# Patient Record
Sex: Female | Born: 1980 | Hispanic: Yes | Marital: Married | Smoking: Former smoker
Health system: Southern US, Community
[De-identification: ages and names within clinical notes are randomized; demographics above are authoritative.]

## PROBLEM LIST (undated history)

## (undated) DIAGNOSIS — E559 Vitamin D deficiency, unspecified: Secondary | ICD-10-CM

## (undated) DIAGNOSIS — E669 Obesity, unspecified: Secondary | ICD-10-CM

## (undated) DIAGNOSIS — F32A Depression, unspecified: Secondary | ICD-10-CM

## (undated) DIAGNOSIS — D649 Anemia, unspecified: Secondary | ICD-10-CM

## (undated) DIAGNOSIS — I10 Essential (primary) hypertension: Secondary | ICD-10-CM

## (undated) DIAGNOSIS — Z803 Family history of malignant neoplasm of breast: Secondary | ICD-10-CM

## (undated) DIAGNOSIS — R632 Polyphagia: Secondary | ICD-10-CM

## (undated) DIAGNOSIS — R7303 Prediabetes: Secondary | ICD-10-CM

## (undated) DIAGNOSIS — F329 Major depressive disorder, single episode, unspecified: Secondary | ICD-10-CM

## (undated) DIAGNOSIS — M1990 Unspecified osteoarthritis, unspecified site: Secondary | ICD-10-CM

## (undated) DIAGNOSIS — F419 Anxiety disorder, unspecified: Secondary | ICD-10-CM

## (undated) DIAGNOSIS — Z923 Personal history of irradiation: Secondary | ICD-10-CM

## (undated) HISTORY — DX: Unspecified osteoarthritis, unspecified site: M19.90

## (undated) HISTORY — DX: Essential (primary) hypertension: I10

## (undated) HISTORY — DX: Anxiety disorder, unspecified: F41.9

## (undated) HISTORY — DX: Depression, unspecified: F32.A

## (undated) HISTORY — DX: Family history of malignant neoplasm of breast: Z80.3

## (undated) HISTORY — DX: Polyphagia: R63.2

## (undated) HISTORY — DX: Vitamin D deficiency, unspecified: E55.9

## (undated) HISTORY — DX: Obesity, unspecified: E66.9

## (undated) HISTORY — DX: Major depressive disorder, single episode, unspecified: F32.9

---

## 2011-08-10 DIAGNOSIS — F319 Bipolar disorder, unspecified: Secondary | ICD-10-CM

## 2011-08-10 HISTORY — DX: Bipolar disorder, unspecified: F31.9

## 2011-09-12 ENCOUNTER — Other Ambulatory Visit (HOSPITAL_BASED_OUTPATIENT_CLINIC_OR_DEPARTMENT_OTHER): Payer: Self-pay | Admitting: Emergency Medicine

## 2011-09-12 ENCOUNTER — Emergency Department
Admit: 2011-09-12 | Discharge: 2011-09-12 | Disposition: A | Discharge status: Home Health | Payer: Self-pay | Attending: Emergency Medicine | Admitting: Emergency Medicine

## 2011-09-12 ENCOUNTER — Encounter (HOSPITAL_COMMUNITY): Payer: Self-pay

## 2011-09-12 HISTORY — DX: Essential (primary) hypertension: I10

## 2011-09-12 LAB — GFR: GFR: >60 mL/min

## 2011-09-12 LAB — COMPREHENSIVE METABOLIC PANEL, BLOOD
ALT (SGPT): 21 U/L (ref 0–33)
AST (SGOT): 18 U/L (ref 0–32)
Albumin: 4.2 g/dL (ref 3.5–5.2)
Alkaline Phos: 69 U/L (ref 35–140)
BUN: 14 mg/dL (ref 6–20)
Bicarbonate: 28 mmol/L (ref 22–29)
Bilirubin, Tot: 0.2 mg/dL (ref <1.2)
Calcium: 9 mg/dL (ref 8.6–10.5)
Chloride: 102 mmol/L (ref 98–107)
Creatinine: 0.82 mg/dL (ref 0.51–0.95)
Glucose: 105 mg/dL (ref 70–115)
Potassium: 3.5 mmol/L (ref 3.5–5.1)
Sodium: 139 mmol/L (ref 136–145)
Total Protein: 7.2 g/dL (ref 6.0–8.0)

## 2011-09-12 LAB — CBC WITH DIFF, BLOOD
ANC-Automated: 5.8 10*3/uL (ref 1.6–7.0)
Abs Eosinophils: 0.1 10*3/uL (ref 0.0–0.5)
Abs Lymphs: 1.8 10*3/uL (ref 0.8–3.1)
Abs Monos: 0.5 10*3/uL (ref 0.2–0.8)
Eosinophils: 1 % (ref 1–7)
Hct: 41.8 % (ref 34.0–45.0)
Hgb: 14.2 gm/dL (ref 11.2–15.7)
Lymphocytes: 21 % (ref 19–53)
MCH: 29 pg (ref 26.0–32.0)
MCHC: 34 % (ref 32.0–36.0)
MCV: 85.5 um3 (ref 79.0–95.0)
MPV: 10.6 fL (ref 9.4–12.4)
Monocytes: 6 % (ref 5–12)
Plt Count: 272 10*3/uL (ref 140–370)
RBC: 4.89 10*6/uL (ref 3.90–5.20)
RDW: 13.6 % (ref 12.0–14.0)
Segs: 71 % (ref 34–71)
WBC: 8.2 10*3/uL (ref 4.0–10.0)

## 2011-09-12 LAB — TSH, BLOOD: TSH: 1.71 u[IU]/mL (ref 0.27–4.20)

## 2011-09-12 MED ORDER — ONDANSETRON HCL 4 MG/2ML IV SOLN
4.0000 mg | Freq: Once | INTRAMUSCULAR | Status: AC
Start: 2011-09-12 — End: 2011-09-12
  Filled 2011-09-12: qty 2

## 2011-09-12 MED ORDER — SODIUM CHLORIDE 0.9 % IV BOLUS
1000.0000 mL | INJECTION | Freq: Once | INTRAVENOUS | Status: AC
Start: 2011-09-12 — End: 2011-09-12

## 2011-09-12 MED ORDER — ONDANSETRON 4 MG OR TBDP
4.0000 mg | ORAL_TABLET | Freq: Three times a day (TID) | ORAL | Status: DC | PRN
Start: 2011-09-12 — End: 2015-03-10

## 2012-10-01 ENCOUNTER — Encounter (HOSPITAL_COMMUNITY): Payer: Self-pay

## 2012-10-01 ENCOUNTER — Emergency Department
Admission: EM | Admit: 2012-10-01 | Discharge: 2012-10-01 | Disposition: A | Discharge status: Home / Self Care | Payer: MEDICAID | Attending: Emergency Medicine | Admitting: Emergency Medicine

## 2012-10-01 DIAGNOSIS — F419 Anxiety disorder, unspecified: Secondary | ICD-10-CM

## 2012-10-01 DIAGNOSIS — F411 Generalized anxiety disorder: Secondary | ICD-10-CM | POA: Insufficient documentation

## 2012-10-01 DIAGNOSIS — R000 Tachycardia, unspecified: Secondary | ICD-10-CM | POA: Insufficient documentation

## 2012-10-01 MED ORDER — SODIUM CHLORIDE 0.9 % IV BOLUS
1000.00 mL | INJECTION | Freq: Once | INTRAVENOUS | Status: AC
Start: 2012-10-01 — End: 2012-10-01

## 2012-10-01 NOTE — ED Notes (Signed)
Urine sample obtained and sent to the lab.

## 2012-10-01 NOTE — ED Notes (Addendum)
Pt ambulated to bathroom. Steady gait. Pt POC blood sugar 120

## 2012-10-01 NOTE — ED Notes (Signed)
Blood samples collected/labeled and sent to the lab.

## 2012-10-01 NOTE — ED Notes (Signed)
Pt states she feels better. Denies anxiety and not shaky.

## 2012-10-01 NOTE — ED Notes (Addendum)
Assisting primary RN:  Pt a/ox4. resp even/unlabored. Nad.   AVS given. Instructed to [facility] to [facility] and for fu, states understanding.  Pt amb out of ed c steady gait, c female companion.

## 2012-10-01 NOTE — Discharge Instructions (Signed)
Anxiety, Panic     You have been diagnosed with an anxiety attack.     You seem to have had an anxiety attack. There are many conditions that can cause symptoms like these. If this is the first time this has happened, have a follow-up with your regular doctor. You may need more testing to be sure there isn’t another cause for your symptoms.     Anxiety causes very strong feelings of worry and fear. It may also cause chest pain or shortness of breath. You may feel like you have palpitations (a racing heart). You might feel numbness (like parts of your body are "asleep"), especially around the mouth and in the hands or feet.     Follow up with your counselor and family doctor. If you do not have an appointment in the next 2-3 days, call and make one. It is VERY IMPORTANT for your counselor and family doctor to know if you get worse.     YOU SHOULD SEEK MEDICAL ATTENTION IMMEDIATELY, EITHER HERE OR AT THE NEAREST EMERGENCY DEPARTMENT, IF ANY OF THE FOLLOWING OCCURS:  · You have symptoms that you normally don’t have with your anxiety attacks.  · You think of harming yourself (suicidal thoughts) or harming someone else.  · You have symptoms you normally don’t have and they last longer than normal or your medicine doesn t help. These include chest pain, passing out, feeling that your heart is racing or shortness of breath.  · You have a fever.

## 2012-10-01 NOTE — ED Attending Note (Signed)
Patient was seen and discussed with resident.     CC. Lightheadedness      HPI. Patient is a 32 year old female presents with anxiety on awakening this morning. Smoked marijuana to alleviate symptoms but they persisted. No other complaints.     Past Medical History   Diagnosis Date   . HTN (hypertension)      No past surgical history on file.  No current facility-administered medications for this encounter.     Current Outpatient Prescriptions   Medication Sig   . ondansetron (ZOFRAN ODT) 4 MG disintegrating tablet Take 1 tablet by mouth every 8 hours as needed for Nausea.     No Known Allergies  No family history on file.    EXAM  BP 146/81  Pulse 120  Temp(Src) 97.6 F (36.4 C)  Resp 20  Ht 5\' 6"  (1.676 m)  Wt 89.812 kg (198 lb)  BMI 31.97 kg/m2  SpO2 95%  LMP 09/10/2012  GEN NAD  HEENT EOMI PERRL anicteric OP clear  NECK No LAN, No thyromegaly  CHEST CTAB, no chest wall tenderness  CVS tachy RR, no MRG, well perfused in all extremities  ABD soft NDNT  SKIN no rash  BACK NT  EXT no edema or tenderness with strong symmetric peripheral pulses    ECG with sinus tachycardia but no ischemia or injury    IMPRESSION  Tachycardia   Anxiety     PLAN   Labs  ECG  Monitor   Reassess

## 2012-10-01 NOTE — ED Notes (Signed)
ekg to ermd Ly, copy in chart

## 2012-10-01 NOTE — ED Provider Notes (Signed)
Emergency Dept Note    Chief Complaint:   Chief Complaint   Patient presents with   . Lightheadedness     Pt brought in by medics. Pt states she woke up this morning feeling shaky,  anxious and lightheaded. Denies pain. Denies palpations. States she smoked some "weed" to help but no relief. Denies any medical history. Stataes she has been under increased stress lately.       HPI:  Sheri Castillo is a 32 year old  female with hx of anxiety, htn and fatigue who presents with palpitations, tremulousness and lightheadedness.  Patient woke up this morning and felt anxious plus felt her hands shaking. Also c/o palpitations.  No CP of sob.  No nauseas/v/f/c/brbpr/ or melonotic stools.  No urinary sxs.  No vag dc or blood.  LMP earlier this month.  Patient says she has had fatigue for over four months.  She was seen here one year ago with similar complaints with nml tsh and not anemic.    Current Discharge Medication List      CONTINUE these medications which have NOT CHANGED    Details   ondansetron (ZOFRAN ODT) 4 MG disintegrating tablet Take 1 tablet by mouth every 8 hours as needed for Nausea.  Qty: 10 tablet, Refills: 0    Associated Diagnoses: Nausea             Allergies: Review of patient's allergies indicates no known allergies.    Past Medical History:   Past Medical History   Diagnosis Date   . HTN (hypertension)        Past Surgical History:   No past surgical history on file.    Family History:   No family history on file.    Social History:   Tobacco: no  EtOH: no  Drug abuse (illicit, IV, Rx): MJ this morning to calm down  Living situation: home      ROS:  As per HPI, unless noted below   Review of systems:   Gen (-) fever   Neuro (-) headache, (-) arm/leg weakness, (-) difficulty speaking/walking   ENT (-) sore throat   Eyes (-) blurry vision   Resp (-) cough   GI (-) abd pain, (-) vomiting   CV (-) chest pain   GU (-) dysuria   Musculoskeletal (-) neck pain, (-) back pain   Skin (-)  rashes        Physical exam  Vital signs reviewed and noted -   4    10/01/12  0934 10/01/12  0958 10/01/12  1041 10/01/12  1100   BP: 133/78 131/88 117/72    Pulse: 110 80 74    Temp:       Resp: 20 20 18 16    SpO2: 98% 96% 96%         Gen: Patient is in NAD, A&O, behaving appropriately, non-toxic appearing  HEENT: NC/AT, PERRL. No icterus, ptosis. Normal oropharynx w/out exudates, erythema. Moist mucous membranes.  Neck: Supple, no JVD, no LAD.  Lungs: Normal breath sounds. No wheeze/rales/rhonchi   CV: RRR. Normal heart sounds. No murmurs appreciated,  nttp  Abdomen: Normal bowel sounds. NTND. No masses, organomegaly.  Back: No CVA tenderness.  Extremities: No cyanosis, edema.   Neurologic: Mentation appropriate. Gait normal. CN II-XII grossly normal.    LABS  Results for orders placed during the hospital encounter of 10/01/12   URINE IMMUNOASSAY DRUG SCREEN       Result Value Range  Amphetamines As A Class Negative  Negative    Barbiturates As A Class Negative  Negative    Benzoylecgonine Negative  Negative    Benzodiazepines As A Class Negative  Negative    Methadone Negative  Negative    Opiates As A Class Negative  Negative    Oxycodone Negative  Negative    Propoxyphen Negative  Negative    Tetrahydrocannabinoids Positive  Negative   D-DIMER HIGHLY SENSITIVE, BLOOD       Result Value Range    D-Dimer HS 205  <241 ng/mL D-DU   TSH, BLOOD       Result Value Range    TSH 0.99  0.27 - 4.20 uIU/mL   BASIC METABOLIC PANEL, BLOOD       Result Value Range    Glucose 137 (*) 70 - 115 mg/dL    BUN 13  6 - 20 mg/dL    Creatinine 1.61  0.96 - 0.95 mg/dL    GFR >04      Sodium 136  136 - 145 mmol/L    Potassium 3.2 (*) 3.5 - 5.1 mmol/L    Chloride 100  98 - 107 mmol/L    Bicarbonate 22  22 - 29 mmol/L    Calcium 8.6  8.6 - 10.0 mg/dL   CBC WITH ADIFF, BLOOD       Result Value Range    WBC 10.1 (*) 4.0 - 10.0 1000/mm3    RBC 5.05  3.90 - 5.20 mill/mm3    Hgb 14.4  11.2 - 15.7 gm/dL    Hct 54.0  98.1 - 19.1 %    MCV 84.0   79.0 - 95.0 um3    MCH 28.5  26.0 - 32.0 pgm    MCHC 34.0  32.0 - 36.0 %    RDW 15.2 (*) 12.0 - 14.0 %    MPV 10.6  9.4 - 12.4 fL    Plt Count 284  140 - 370 1000/mm3    Segs 73 (*) 34 - 71 %    Lymphocytes 19  19 - 53 %    Monocytes 7  5 - 12 %    Eosinophils 1  1 - 7 %    Absolute Neutrophil Count 7.4 (*) 1.6 - 7.0 1000/mm3    Abs Lymphs 1.9  0.8 - 3.1 1000/mm3    Abs Monos 0.7  0.2 - 0.8 1000/mm3    Abs Eosinophils 0.1  0.0 - 0.5 1000/mm3    Diff Type Automated         DIAGNOSTIC STUDIES  EKG interpretation: NSR, tachy 116 flipped t wave in III with that apears to be isolated      Assessment and Clinical Decision-making  32 yo with hx of anxiety pw palpitation.  Responded to fluid bolus.  Nml EKG and nml tsh with no electrolyte abnls.  Patient felt safe to DC and likely due to anxiety attach.  Told to f/u with PMD. NO CP but given tachy patient r/u PE with d-dimer being neg    Labs and studies as ordered:  tsh nml,  Neg d dimer,  stess leukocytosis, unremarkeable bmp    ED Course  NS and tachy improved.  Got up out of bed and hr normalized to low 80s    Dispo:  Home with PMD f/u      Patient discussed with attending, No att. providers found, before final recommendations are made.  Will Bonnet, MD  Resident  10/01/12 [phone]

## 2012-10-02 LAB — ECG 12-LEAD
QRS INTERVAL/DURATION: 92 ms
VENTRICULAR RATE: 116 {beats}/min

## 2012-10-18 NOTE — ED Follow-up Note (Signed)
Follow-up type: Callback       Routine ED Patient Call Back    Patient unable to be contacted, no message left

## 2015-02-17 ENCOUNTER — Telehealth (INDEPENDENT_AMBULATORY_CARE_PROVIDER_SITE_OTHER): Payer: Self-pay | Admitting: Psychiatry

## 2015-02-17 ENCOUNTER — Encounter (INDEPENDENT_AMBULATORY_CARE_PROVIDER_SITE_OTHER): Payer: MEDICAID | Admitting: Clinical

## 2015-02-17 NOTE — Telephone Encounter (Signed)
Pt called to report that she is almost out of Trazodone, Effexor XR and Li.  She rec'd these meds after a recent psych [facility] (released 1.5wk ago) s/p an OD.  No current SI.     I advised that since I have yet to meet the patient that she should either contact the prescribed who gave her those meds and request a refill or visit the [facility] ED to request a refill since that is where she was recently hospitalized.      I reminded the pt of her intake appt on Aug 1st. She had no other questions at this time.

## 2015-02-17 NOTE — Telephone Encounter (Signed)
Patient called 2 times over the weekend and left messages saying she wanted to speak to Dr. Milana NaKistler. She had an intake with him on 8/1 but has now been transferred to you. She has not been seen by any physiatrist yet. She was very upset on the phone. It sounded like she wants medication. Can you please call her since she has an intake appointment with you on 8/1. Her number [phone]

## 2015-03-10 ENCOUNTER — Encounter (INDEPENDENT_AMBULATORY_CARE_PROVIDER_SITE_OTHER): Payer: PRIVATE HEALTH INSURANCE | Admitting: Psychiatry

## 2015-03-10 ENCOUNTER — Encounter (INDEPENDENT_AMBULATORY_CARE_PROVIDER_SITE_OTHER): Payer: Self-pay | Admitting: Psychiatry

## 2015-03-10 ENCOUNTER — Other Ambulatory Visit: Payer: PRIVATE HEALTH INSURANCE | Attending: Psychiatry

## 2015-03-10 ENCOUNTER — Ambulatory Visit (INDEPENDENT_AMBULATORY_CARE_PROVIDER_SITE_OTHER): Payer: 59 | Admitting: Psychiatry

## 2015-03-10 ENCOUNTER — Encounter (INDEPENDENT_AMBULATORY_CARE_PROVIDER_SITE_OTHER): Payer: MEDICAID | Admitting: Psychiatry

## 2015-03-10 VITALS — BP 148/88 | HR 87 | Temp 97.8°F | Resp 18 | Ht 66.0 in | Wt 198.0 lb

## 2015-03-10 DIAGNOSIS — F39 Unspecified mood [affective] disorder: Secondary | ICD-10-CM

## 2015-03-10 DIAGNOSIS — F331 Major depressive disorder, recurrent, moderate: Principal | ICD-10-CM

## 2015-03-10 DIAGNOSIS — F1290 Cannabis use, unspecified, uncomplicated: Secondary | ICD-10-CM

## 2015-03-10 LAB — CBC WITH DIFF, BLOOD
ANC-Automated: 6.3 10*3/uL (ref 1.6–7.0)
Abs Eosinophils: 0.1 10*3/uL (ref 0.1–0.7)
Abs Lymphs: 1.7 10*3/uL (ref 0.8–3.1)
Abs Monos: 0.8 10*3/uL (ref 0.2–0.8)
Eosinophils: 1 % (ref 1–4)
Hct: 39 % (ref 34.0–45.0)
Hgb: 12.6 gm/dL (ref 11.2–15.7)
Imm Gran %: 1 % (ref <1)
Imm Gran Abs: 0.1 10*3/uL (ref <0.1)
Lymphocytes: 19 % (ref 19–53)
MCH: 27.4 pg (ref 26.0–32.0)
MCHC: 32.3 % (ref 32.0–36.0)
MCV: 84.8 um3 (ref 79.0–95.0)
MPV: 10.7 fL (ref 9.4–12.4)
Monocytes: 9 % (ref 5–12)
Plt Count: 318 10*3/uL (ref 140–370)
RBC: 4.6 10*6/uL (ref 3.90–5.20)
RDW: 15.5 % — ABNORMAL HIGH (ref 12.0–14.0)
Segs: 70 % (ref 34–71)
WBC: 9 10*3/uL (ref 4.0–10.0)

## 2015-03-10 LAB — TSH, BLOOD: TSH: 2.91 u[IU]/mL (ref 0.27–4.20)

## 2015-03-10 LAB — FREE THYROXINE, BLOOD: Free T4: 1 ng/dL (ref 0.93–1.70)

## 2015-03-10 MED ORDER — PROPRANOLOL HCL 40 MG OR TABS
40.0000 mg | ORAL_TABLET | Freq: Two times a day (BID) | ORAL | 3 refills | Status: DC
Start: 2015-03-10 — End: 2015-10-13

## 2015-03-10 MED ORDER — VENLAFAXINE HCL 37.5 MG OR TABS
37.5000 mg | ORAL_TABLET | Freq: Every day | ORAL | 0 refills | Status: DC
Start: 2015-03-10 — End: 2015-10-13

## 2015-03-10 MED ORDER — TRAZODONE HCL 50 MG OR TABS
50.0000 mg | ORAL_TABLET | Freq: Every evening | ORAL | 3 refills | Status: AC
Start: 2015-03-10 — End: 2015-05-05

## 2015-03-10 MED ORDER — BUPROPION XL (DAILY) 150 MG OR TB24
150.0000 mg | ORAL_TABLET | Freq: Every morning | ORAL | 3 refills | Status: DC
Start: 2015-03-10 — End: 2015-10-13
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
## 2015-04-17 ENCOUNTER — Encounter (INDEPENDENT_AMBULATORY_CARE_PROVIDER_SITE_OTHER): Payer: PRIVATE HEALTH INSURANCE | Admitting: Clinical

## 2015-04-17 ENCOUNTER — Encounter (INDEPENDENT_AMBULATORY_CARE_PROVIDER_SITE_OTHER): Payer: MEDICAID | Admitting: Clinical

## 2015-04-21 ENCOUNTER — Telehealth (INDEPENDENT_AMBULATORY_CARE_PROVIDER_SITE_OTHER): Payer: Self-pay | Admitting: Psychiatry

## 2015-04-21 NOTE — Telephone Encounter (Signed)
It is perfectly fine if the patient wants to reschedule with another provider that is more convenient for her schedule.  Dr. Theodoro Doing sees patients on Monday afternoons.

## 2015-04-21 NOTE — Telephone Encounter (Signed)
Patient called wanting to reschedule appointment. Patient has been seen by Dr. Nelta Numbers but because of her work she is no long able to make her appointment on Monday morning and would like to schedule her appointment on Monday after  1pm. Informed patient that we would send over a message to Dr. Nelta Numbers for prior authorization and would return a call back to patient once we receive a response from Dr.  Nelta Numbers. Please assist. Thank you.    [facility]

## 2015-04-28 ENCOUNTER — Encounter (INDEPENDENT_AMBULATORY_CARE_PROVIDER_SITE_OTHER): Payer: PRIVATE HEALTH INSURANCE | Admitting: Psychiatry

## 2015-06-16 ENCOUNTER — Encounter (INDEPENDENT_AMBULATORY_CARE_PROVIDER_SITE_OTHER): Payer: PRIVATE HEALTH INSURANCE | Admitting: Psychiatry

## 2015-06-18 ENCOUNTER — Encounter (INDEPENDENT_AMBULATORY_CARE_PROVIDER_SITE_OTHER): Payer: 59 | Admitting: Clinical

## 2015-06-18 DIAGNOSIS — F1290 Cannabis use, unspecified, uncomplicated: Secondary | ICD-10-CM

## 2015-06-18 DIAGNOSIS — F331 Major depressive disorder, recurrent, moderate: Principal | ICD-10-CM

## 2015-06-18 DIAGNOSIS — F605 Obsessive-compulsive personality disorder: Secondary | ICD-10-CM
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
## 2015-07-23 ENCOUNTER — Encounter (INDEPENDENT_AMBULATORY_CARE_PROVIDER_SITE_OTHER): Payer: 59 | Admitting: Clinical

## 2015-07-23 DIAGNOSIS — F605 Obsessive-compulsive personality disorder: Secondary | ICD-10-CM

## 2015-07-23 DIAGNOSIS — F1290 Cannabis use, unspecified, uncomplicated: Secondary | ICD-10-CM

## 2015-07-23 DIAGNOSIS — F331 Major depressive disorder, recurrent, moderate: Principal | ICD-10-CM

## 2015-07-23 NOTE — Interdisciplinary (Signed)
Psychotherapy Follow up Note  Date: 07/23/15  Identifying info: Sheri Castillo is a 34 year old female being treated for Major depression, recurrent with moderate to severe symptoms and obsessive compulsive pd with marijuana use.   Chief Complaint:   Chief Complaint   Patient presents with    Depression    Anxiety       Interim History and current symptoms: Sheri Castillo presented for her initial post-intake psychotherapy session. She announced that she had "very good news. My wife and I are moving into our own apartment on the 28th of this month." Patient reports that she and her wife had to save money for several months to pay for the $5000. Down payment due for them to enter the new apartment. Patient says that both she and her partner are "thrilled." She reports that the couple have been living in her car now for two years and "we are, frankly, totally worn out. It is very hard to schedule in a shower and eating and that type of thing when one is homeless." Patient reports that once she gets situated in her new apartment, she is hoping to begin studying for the military intake test "because what I really still want to do with my life is enter the [employer]. I know myself really well and I know that I would do well if I was in a highly structured and strict situation."  Patient reports that her mood has been pretty good until recently when her mood plunged "because I am so tired from working so hard." Patient says that her work day is from 4 AM to 2 PM each day. Patient says that she also feels sad because she learned that just today the [employer] tore down the downtown homeless encampment. Patient says that this brings her to tears. Patient denies having suicidal ideation. She states that she feels hopeful and "I am now able to count my blessings. Think about all that I have now when compared to those who are truly homeless."     Intervention: CBT strategies, Psycho education and Psychodynamic psychotherapy      Response: Patient was friendly, fully engaged, open and authentic.    Treatment Plan: Patient will report using the "salami technique" as a means to break down a big project (such as studying for the military entrance exam) into smaller, "doable" slices. Patient will report also approaching problem-solving when she is in her wise mind and not in her fight-flight mind.   Progress in Treatment: good    Mental Status Exam  Physical appearance: appropriate appearance   , adequate grooming    and well-nourished     Relatedness: engaged  Eye contact: good   Attitude: cooperative  Speech quality: clear with normal rate and volume     Motor behavior: normal  Mood: dysphoric  Affect: congruent  Thought process: linear, organized  Thought content: no evidence of psychotic symptoms  Suicidal ideation: none  Homicidal ideation: none  Orientated to: Person:  yes  Place:  yes   Time:  yes   Sensorium: intact  Attention/Concentration: grossly intact    Memory: grossly intact   Insight: fair  Judgment: fair    Plan: See patient in three weeks for individual CBT and interpersonal psychotherapy.   Disposition:  Return Visit 3 weeks    Referrals: [name] was seen today for depression and anxiety.    Diagnoses and all orders for this visit:    MDD (major depressive disorder), recurrent episode, moderate (CMS-HCC)  Obsessive compulsive personality disorder    Cannabis use, uncomplicated      Face to face time spent: 45  Documentation time: 10

## 2015-08-13 ENCOUNTER — Encounter (INDEPENDENT_AMBULATORY_CARE_PROVIDER_SITE_OTHER): Payer: 59 | Admitting: Clinical

## 2015-08-13 DIAGNOSIS — F605 Obsessive-compulsive personality disorder: Secondary | ICD-10-CM

## 2015-08-13 DIAGNOSIS — F331 Major depressive disorder, recurrent, moderate: Principal | ICD-10-CM

## 2015-08-13 DIAGNOSIS — F1290 Cannabis use, unspecified, uncomplicated: Secondary | ICD-10-CM

## 2015-08-13 NOTE — Interdisciplinary (Signed)
Psychotherapy Follow up Note  Date: 08/13/15  Identifying info: ADILENY DELON is a 35 year old female being treated for Major depression, recurrent with moderate to severe symptoms with history of suicide attempt complicated by obsessive compulsive pd. Patient smokes marijuana episodically to quell anxiety.   Chief Complaint:   Chief Complaint   Patient presents with   . Depression   . Anxiety       Interim History and current symptoms: Marguita presented for individual CBT and interpersonal psychotherapy. She reports that she and her partner moved into an apartment on December 28 after living two years out of patient's [address]. Patient reports that the couple had furniture in storage so found themselves with everything that they needed to move in. Patient reports that one thing that is worrying her is "that I find myself with unpredictable periods of very low mood for no reason. I felt so happy to move into our new apartment. Nothing really bad is happening these days. Both my partner and I have jobs and now we have housing; why should I be depressed? " Patient reports that she is entertaining the idea of following through with this therapist's advice to meet with one of our psychiatric providers. Patient says that one reason she is so reluctant to try medication "is that I have had some bad experiences." Patient reports that she once took lithium for a few months and struggled with horrific diarrhea that would not resolve until she quit taking the drug. She states that her PCP told her that she was being poisoned. Patient reports that she is not entirely certain why she was given a medication for bipolar illness when she is adamant that she has never been manic. She does report in the past having periods of insomnia but says that she had no symptoms of mania other than that (no goal-directedness, no excessive spending or sexual acting out). Patient reports that she would be willing to talk with one of our nurse  practioners to explore the pros and cons of meds for her. She also acknowledged that she noticed that she has a pattern of becoming depressed "when I have nothing to do and when I am bored. I have even thought of taking another job." Patient agreed to talk with [facility] about signing up with them to take the peer counseling training. She acknowledged that she enjoys helping others. Patient reports some transient suicidal ideation ("why bother?") but denies having any intention or plan; "I have a lot to live for. I love my wife and I love my new apartment."     Intervention: CBT strategies, Psycho education and Psychodynamic psychotherapy   Response: Patient was fully engaged, authentic and open.   Treatment Plan: Patient will be more aggressive using CBT tools to talk back to her intrusive doubt and self-deprecating thoughts that fuel her low mood. She will be honest with this therapist if her suicidal ideation becomes more real with plan or intent. She will contact [facility] to learn about their peer training program. She will start fishing again (something she reports gives her pleasure).   Progress in Treatment: fair    Mental Status Exam  Physical appearance: appropriate appearance   , adequate grooming    and well-nourished     Relatedness: engaged  Eye contact: good   Attitude: cooperative  Speech quality: clear with normal rate and volume     Motor behavior: normal  Mood: dysphoric and anxious  Affect: restricted  Thought process:  ruminative  Thought content: no evidence of psychotic symptoms  Suicidal ideation: none  Homicidal ideation: none  Orientated to: Person:  yes  Place:  yes   Time:  yes   Sensorium: intact  Attention/Concentration: grossly intact    Memory: grossly intact   Insight: fair  Judgment: intact    Plan: See patient in two weeks for ongoing individual CBT.  Disposition:  Return Visit 2 weeks    Referrals: Shanda BumpsJessica was seen today for depression and anxiety.    Diagnoses and all orders  for this visit:    MDD (major depressive disorder), recurrent episode, moderate (CMS-HCC)    Obsessive compulsive personality disorder    Cannabis use, uncomplicated      Face to face time spent: 45  Documentation time: 10

## 2015-08-27 ENCOUNTER — Encounter (INDEPENDENT_AMBULATORY_CARE_PROVIDER_SITE_OTHER): Payer: 59 | Admitting: Clinical

## 2015-09-10 ENCOUNTER — Telehealth (INDEPENDENT_AMBULATORY_CARE_PROVIDER_SITE_OTHER): Payer: Self-pay | Admitting: Clinical

## 2015-09-10 ENCOUNTER — Encounter (INDEPENDENT_AMBULATORY_CARE_PROVIDER_SITE_OTHER): Payer: 59 | Admitting: Clinical

## 2015-09-10 DIAGNOSIS — F331 Major depressive disorder, recurrent, moderate: Principal | ICD-10-CM

## 2015-09-10 DIAGNOSIS — F605 Obsessive-compulsive personality disorder: Secondary | ICD-10-CM

## 2015-09-10 DIAGNOSIS — F1290 Cannabis use, unspecified, uncomplicated: Secondary | ICD-10-CM

## 2015-09-10 NOTE — Telephone Encounter (Signed)
Pt is calling to request FMLA forms to be filled for her partner, so she does not have any issues when requesting time off to be with the patient. Patient stated she will be faxing forms to [phone]. Please contact patient if forms are not able to be filled out.       Pt phone: [phone]

## 2015-09-10 NOTE — Interdisciplinary (Signed)
Psychotherapy Follow up Note  Date: 09/10/15  Identifying info: Sheri Castillo is a 35 year old female being treated for Major depression, recurrent with moderate symptoms complicated by obsessive compulsive pd and episodic marijuana use.   Chief Complaint:   Chief Complaint   Patient presents with   . Depression   . Anxiety       Interim History and current symptoms: Sheri Castillo presented for individual psychotherapy. She reports that she and her partner are very much enjoying their new apartment and both are pleased to no longer be living in a car. Patient, however, reports that her mood has been "very very low. There are times when I just find myself sitting on the couch and staring out into space because I feel so depressed." Patient reports that she has her first psychiatry appointment with Dr. Theodoro Doing in a couple of weeks. 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  She says that she sweats a great deal and often feels claustrophobic. Patient says that she took a beta-blocker for anxiety in the past and she is wondering if that might not help her again with the anxiety she feels while at work. Patient reports that she has applied to the [employer] to become one of their workers. 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 She reports that she did well on the practice test. Patient reports that another source of distress at her job is the fact that a co-worker appears to have a personality disorder. She says that this young woman is driving everyone crazy but catching people being bad and then of reporting them to the higher ups. Patient says that all of the line staff are complaining about this young woman. Patient says that the young woman scolded her for not refilling the xerox machine and patient admits "I went  crazy. I was furious." The good news, says patient, is that she insisted that the young woman go up to the office so that she and the young woman could talk with their supervisor about what is going on. Patient reports that she understands that she needs to manage her temper. She says, however, in this case, nothing had been done about the problematic co-worker until patient lost her temper. Overall, patient says that she feels sad that so much good is happening in her life. She has a good job and a new apartment and a loving partner, "and I am unhappy." Patient acknowledged having intrusive doubts about whether psychiatric medication could help her. She says that she is willing to talk with the psychiatrist but is not certain that she wants meds.     Intervention: CBT strategies, Psycho education and Psychodynamic psychotherapy   Response: Patient was friendly, open and authentic. She was appreciative of opportunity to talk openly about losing her temper at work.   Treatment Plan: Patient will attempt to be more aware when her anger is building. She will use grounding tools to lower her arousal (walking, talking with a friend, being mindful).   Progress in Treatment: fair    Mental Status Exam  Physical appearance: appropriate appearance   , adequate grooming    and well-nourished     Relatedness: engaged  Eye contact: good   Attitude: cooperative  Speech quality: clear with  normal rate and volume     Motor behavior: normal  Mood: dysphoric and anxious and irritable.  Affect: congruent  Thought process: linear, organized  Thought content: no evidence of psychotic symptoms  Suicidal ideation: none  Homicidal ideation: none  Orientated to: Person:  yes  Place:  yes   Time:  yes   Sensorium: intact  Attention/Concentration: grossly intact    Memory: grossly intact   Insight: fair  Judgment: fair    Plan: See patient in two weeks for individual CBT.   Disposition:  Return Visit 2 weeks    Referrals: Sheri Castillo was seen  today for depression and anxiety.    Diagnoses and all orders for this visit:    MDD (major depressive disorder), recurrent episode, moderate (CMS-HCC)    Obsessive compulsive personality disorder    Cannabis use, uncomplicated      Face to face time spent: 45  Documentation time: 10

## 2015-09-22 ENCOUNTER — Encounter (INDEPENDENT_AMBULATORY_CARE_PROVIDER_SITE_OTHER): Payer: Self-pay | Admitting: Psychiatry

## 2015-09-22 ENCOUNTER — Encounter (INDEPENDENT_AMBULATORY_CARE_PROVIDER_SITE_OTHER): Payer: 59 | Admitting: Psychiatry

## 2015-09-22 ENCOUNTER — Encounter (INDEPENDENT_AMBULATORY_CARE_PROVIDER_SITE_OTHER): Payer: 59 | Admitting: Registered Nurse

## 2015-09-22 NOTE — Progress Notes (Signed)
Patient did not show for her psychiatric appointment today.

## 2015-09-24 ENCOUNTER — Encounter (INDEPENDENT_AMBULATORY_CARE_PROVIDER_SITE_OTHER): Payer: 59 | Admitting: Clinical

## 2015-10-08 ENCOUNTER — Encounter (INDEPENDENT_AMBULATORY_CARE_PROVIDER_SITE_OTHER): Payer: 59 | Admitting: Clinical

## 2015-10-08 DIAGNOSIS — F1290 Cannabis use, unspecified, uncomplicated: Secondary | ICD-10-CM

## 2015-10-08 DIAGNOSIS — F605 Obsessive-compulsive personality disorder: Secondary | ICD-10-CM

## 2015-10-08 DIAGNOSIS — F331 Major depressive disorder, recurrent, moderate: Principal | ICD-10-CM
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
## 2015-10-13 ENCOUNTER — Ambulatory Visit (INDEPENDENT_AMBULATORY_CARE_PROVIDER_SITE_OTHER): Payer: 59 | Admitting: Psychiatry

## 2015-10-13 DIAGNOSIS — F331 Major depressive disorder, recurrent, moderate: Principal | ICD-10-CM

## 2015-10-13 MED ORDER — PROPRANOLOL HCL 40 MG OR TABS
40.0000 mg | ORAL_TABLET | Freq: Two times a day (BID) | ORAL | 2 refills | Status: DC
Start: 2015-10-13 — End: 2016-03-04

## 2015-10-13 NOTE — Progress Notes (Signed)
Adult Psychiatry Established Patient Follow-up Appt    Patient name: DANICKA HOURIHAN  Date: 10/13/15  Time: 1500  Clinic: [facility]     HISTORY     Chief Complaint: "I'm feeling down"  HPI: DUSTYN ARMBRISTER is a 35 year old female with a history of MDD who presents to clinic for a follow up appointment and to meet her new provider.  Patient reports that she is feeling "not really good," reports that she has been "just trying to deal with feeling down and everything."  Reports that "nothing that I'm doing makes sense," reports that there is no excitement in her life, that she feels like she doesn't want to be alive.  Reports that she doesn't feel like she has a choice, that she has to exist but doesn't want to.  Reports that she has been "just dealing with it," because she doesn't want to take medications.  Reports that the only thing she has been taking is propranolol because that helps her "like a lot," reports that it helps with anxiety and tremors.  Reports that she is not interested in starting medication for depression, reports that she thinks she might be ready soon, but reports that she is not interested in antidepressants.  Reports that she was previously taking Effexor, reports that she feels that taking Effexor traumatized her, reports that starting it and getting off of it were both horrible, reports that she would start sweating, and would have brain zaps.  Reports that she took an overdose on Effexor one year ago.  Denies recent suicide attempts, reports that she does not have current plans or intent for harming herself, reports that she tries to think about what she has going for herself, like her wife, reports that they were recently living in their truck because she couldn't keep a job, and they just got into an apartment.  Denies HI, A/VH, denies decreased need for sleep, denies paranoia.  Reports that she is taking propranolol twice a day.    Problem/Condition:  Substance use:  Reports casual drinking, drinks about 1 beer in a week, reports smoking cigarettes rarely, marijuana if she cannot sleep, every other day; Illicit drugs: denies  Side effects: Denies side effects from propranolol  Comorbidities: Denies    Past Psychiatric, Family, & Social History Updates: Has tried Prozac, Zoloft, Wellbutrin (reports that she can't remember if this was helpful), Trazodone (sleep).  Reports that she was in the hospital almost a year ago, reports that she has not been in the hospital since the last time she was seen.  Reports that she also tried abilify (made her restless), trileptal.  Has also tried Lithium, reports that it "kinda helped," but that she had stomach problems.  Reports that she has heard that lithium is not good for your body, did not want to continue to take it.  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    Past Medical History:  Patient Active Problem List   Diagnosis   . MDD (major depressive disorder), recurrent episode, moderate (CMS-HCC)   . Cannabis use, uncomplicated   . Obsessive compulsive personality disorder       Allergies:   No Known Allergies    Medications:  Current Outpatient Prescriptions on File Prior to Visit   Medication Sig Dispense Refill   . buPROPion (WELLBUTRIN XL) 150 MG XL tablet Take 1 tablet (150 mg) by mouth every morning. 14 tablet  3   . propranolol (INDERAL) 40 MG tablet Take 1 tablet (40 mg) by mouth 2 times daily. 28 tablet 3   . venlafaxine (EFFEXOR) 37.5 MG tablet Take 1 tablet (37.5 mg) by mouth daily for 14 days. 14 tablet 0     No current facility-administered medications on file prior to visit.        Review of Systems Update:  Review of Systems   Constitutional: Positive for malaise/fatigue.   HENT: Negative.    Eyes: Negative.    Respiratory: Negative.    Cardiovascular: Negative.    Gastrointestinal: Negative.    Genitourinary: Negative.    Musculoskeletal: Negative.    Skin:  Negative.    Neurological: Negative.    Endo/Heme/Allergies: Negative.          PSYCHIATRIC SPECIALTY EXAMINATION     Vital Signs:   Blood pressure 139/83, pulse 51, temperature 97.2 F (36.2 C), temperature source Temporal Artery, resp. rate 16, height 5\' 6"  (1.676 m), weight 89.8 kg (198 lb), SpO2 97 %.    Psychometric Scales:  [place] PHQ9 DEPRESSION QUESTIONNAIRE 10/13/2015   Interest 3   Depressed 1   Sleep 0   Energy 1   Appetite 0   Failure 3   Concentration 1   Movement 0   Suicide 1   Summary(Manual) --   Summary(Calculated) 10   Functional 1       GAD 7 10/13/2015   Feeling afraid as if something awful might happen 0   Being easily annoyed or irritable 1   Worrying too much about different things 3   If you checked off any problems, how difficult have these problems made it for you to do your job along with other people? Not difficult at all   Feeling nervous, anxious or on edge 1   Trouble relaxing 1   Being so restless that it is hard to sit still 0   GAD7 Patient Total 9   Not being able to stop or control worrying 3       Pertinent Studies/Labs:   Reviewed labs from 03/10/15    Mental Status Examination:   Appearance: Appearing stated age overweight Latina female wearing clean street clothing, sitting comfortably, short curly hair appears well groomed, wearing strong smelling cologne   Behavior: Attitude is cooperative and pleasant, makes good eye contact with interviewer, no psychomotor agitation or retardation.   Motor/Abnormal Involuntary Movements: No abnormal or involuntary movements   Gait: No abnormality    Speech: Normal rate, tone, volume, prosody   Mood: "I'm feeling down"   Affect: At times euthymic and full, but does appear restricted to dysthymic range most of the time.  Becomes tearful at times.   Thought process: coherent, logical  Associations: linear and goal-directed  Thought Content:  Denied suicidal or homicidal thoughts; no violent ideations  Perceptions: no abnormal  perceptions  Insight/Judgment: fair/limited   Orientation: AAO x4   Memory: recent and remote memory grossly intact   Attention/Concentration: no gross abnormalities   Language:  average based on verbal fluency and interaction   Fund of knowledge and Intellect: average based on interview and verbal fluency     MEDICAL DECISION MAKING     Assessment:  Tonita PhoenixJessica D Wauters is a 35 year old female with a history of MDD who presents to clinic for a follow up appointment and to meet her new provider.  Patient does present as quite depressed, discussed with her that ideally she should be taking  an antidepressant, as the propranolol she is taking is not going to benefit her mood.  Discussed side effects of other antidepressants, how they are different from Effexor, which truly she did not like.  Discussed that there are other options out there other than the ones she has already tried, and that she likely will do much better on an antidepressant.  She remains skeptical, and would like to continue to "think about" taking medications.  Discussed safety plan for intermittent suicidality, including calling the access and crisis line and presenting to the ED, patient voiced understanding and was provided the access and crisis line phone number today.    A comprehensive suicide risk assessment was performed and the patient was assessed to be at a low acute risk of self-harm.  Modifiable risk factors include suicidality (manifested by suicidal ideation) and precipitating stressors.  Non-modifiable risk factors include previous suicide attempts, existing psychiatric diagnoses and history of childhood trauma.  The patient also has protective factors of future life plans, coping skills, therapeutic relationships and access to health care.      Diagnostic Impression/Status:   MDD, recurrent, severe with anxious distress  Cannabis use  Borderline traits      Plan/Recommendations:  Intervention/Psychotherapy:   - Provided  psychoeducation, supportive therapy and empathic listening today.  - Discussed suicide and emergency room precautions.     Medication:   - Continue Propranolol  BID for anxiety refilled x2 today.  - Requested that patient consider starting another antidepressant, perhaps Celexa or Lexapro, or even could try Lamotrigine, as patient does have some mood lability in her history.  - Discussed risks/benefits/alteratives to medication, patient voiced understanding.    Labs/Radiology/Tests/Consultation: None required at this time    Disposition: Return Visit 4 weeks       Alfredo Martinez, MD  Staff Psychiatrist  [facility]

## 2015-11-21 ENCOUNTER — Encounter (INDEPENDENT_AMBULATORY_CARE_PROVIDER_SITE_OTHER): Payer: 59 | Admitting: Psychiatry

## 2015-11-21 ENCOUNTER — Encounter (INDEPENDENT_AMBULATORY_CARE_PROVIDER_SITE_OTHER): Payer: Self-pay | Admitting: Psychiatry

## 2015-11-21 NOTE — Progress Notes (Signed)
Patient did not show for her 2:00 psychiatric follow up appointment today.

## 2016-03-04 ENCOUNTER — Ambulatory Visit (INDEPENDENT_AMBULATORY_CARE_PROVIDER_SITE_OTHER): Payer: 59 | Admitting: Psychiatry

## 2016-03-04 VITALS — BP 127/84 | HR 59 | Resp 16 | Ht 66.0 in | Wt 198.0 lb

## 2016-03-04 DIAGNOSIS — F39 Unspecified mood [affective] disorder: Principal | ICD-10-CM

## 2016-03-04 DIAGNOSIS — F331 Major depressive disorder, recurrent, moderate: Secondary | ICD-10-CM

## 2016-03-04 MED ORDER — LAMOTRIGINE 25 MG OR TABS
ORAL_TABLET | ORAL | 0 refills | Status: DC
Start: 2016-03-04 — End: 2016-09-26

## 2016-03-04 MED ORDER — PROPRANOLOL HCL 40 MG OR TABS
40.0000 mg | ORAL_TABLET | Freq: Two times a day (BID) | ORAL | 2 refills | Status: DC
Start: 2016-03-04 — End: 2016-08-12

## 2016-03-04 NOTE — Progress Notes (Signed)
Adult Psychiatry Established Patient Follow-up Appt    Patient name: Sheri Castillo  Date: 03/04/16  Time in: 0935   Clinic: [facility]     HISTORY     Chief Complaint: "I'm really depressed"  HPI: Sheri Castillo is a 35 year old female with a history of mood disorder who presents to clinic for a follow up appointment.  Patient brings in FMLA paperwork for her wife as her wife has had to stay home from work to help care for the patient this past week.  Patient reports that she had to go to the ED because she was feeling depressed, came to the office to see me last week but didn't have an appointment.  Reports that she went to [facility] and was given bupropion which she feels makes her stomach hurt.  Reports that she has been very stressed recently, that she hasn't been feeling interested in anything, reports that she feels like she doesn't want to be alive.  Denies active SI right now, but reports that she dwells on not wanting to be alive.  Reports that usually when she tries to kill herself she takes pills.  Reports that it has been hard to concentrate recently, that she has been fired from jobs because she has been anxious in the past.  Reports that she wants something that will stabilize her mood.  Reports that her sleep is "okay," reports that every once in a while she smokes marijuana, which helps her sleep (reports smoking every other day).  Denies decreased need for sleep.  Reports that she remembers that Prozac was not helpful and Zoloft made her feel "edgy," cannot remember doses or how long she took them.  Denies HI, A/VH, denies paranoia.     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  Reports that she got time off from the ED doctor last week.     Past Medical History:  Patient Active Problem List   Diagnosis   . MDD (major depressive disorder), recurrent episode, moderate (CMS-HCC)   . Cannabis use, uncomplicated   . Obsessive compulsive personality disorder       Allergies:   No Known Allergies    Medications:  Current Outpatient Prescriptions on File Prior to Visit   Medication Sig Dispense Refill   . [DISCONTINUED] propranolol (INDERAL) 40 MG tablet Take 1 tablet (40 mg) by mouth 2 times daily. 60 tablet 2     No current facility-administered medications on file prior to visit.        Review of Systems Update:  Constitutional: negative.  Eyes: negative.  Ears, Nose, Mouth, Throat: negative.  CV: negative.  Resp: negative.  GI: negative.  GU: negative.  Musculoskeletal: negative.  Integumentary: negative.  Neuro: negative.  Endo: negative.  Heme/Lymphatic: negative.      PSYCHIATRIC SPECIALTY EXAMINATION     Vital Signs:   Blood pressure 127/84, pulse 59, resp. rate 16, height 5\' 6"  (1.676 m), weight 89.8 kg (198 lb), SpO2 97 %.    Psychometric Scales:  PHQ2 QUESTIONNAIRE 03/04/2016 10/13/2015   Little interest or pleasure in doing things 3 3   Feeling down, depressed, or hopeless 3 1  Trouble falling or staying asleep, or sleeping too much 0 0   Feeling tired or having little energy 3 1   Poor appetite or overeating 0 0   Feeling bad about yourself--or that you are a failure ot have let yourself or your family down 3 3   Trouble concentrating on things, such as reading the newspaper or watching television 2 1   Moving or speaking so slowly that other people could have noticed.  Or the opposite--being so fidgety or restless that you have been moving around a lot more than usual 0 0   Thoughts that you would be better off dead, or of hurting yourself in some way 3 1   If you checked off any problems, how difficult have these problems made it for you to do your work, take care of things  at home, or get along with other people? Somewhat difficult 1   PHQ9 Patient Summary Score (calculated) 17 10     GAD 7 10/13/2015 03/04/2016   Feeling afraid as if something awful might happen 0 2   Being easily annoyed or irritable 1 1   Worrying too much about different things 3 2   If you checked off any problems, how difficult have these problems made it for you to do your job along with other people? Not difficult at all Somewhat difficult   Feeling nervous, anxious or on edge 1 2   Trouble relaxing 1 2   Being so restless that it is hard to sit still 0 1   GAD7 Patient Total 9 12   Not being able to stop or control worrying 3 2       Pertinent Studies/Labs:   None new    Mental Status Examination:   Appearance: Appearing stated age overweight female with dark hair pulled back in a pony tail, wearing clean street clothing, sitting comfortably.  Behavior: Attitude is cooperative and pleasant, makes good eye contact with interviewer, no psychomotor agitation or retardation.   Motor/Abnormal Involuntary Movements: No abnormal or involuntary movements   Gait: No abnormality    Speech: Normal rate, tone, volume, prosody   Mood: "not great"   Affect: Restricted to dysthymic range   Thought process: coherent, logical  Associations: linear and goal-directed  Thought Content:  Denied suicidal or homicidal thoughts; no violent ideations  Perceptions: no abnormal perceptions  Insight/Judgment: fair/fair   Orientation: AAO x4   Memory: recent and remote memory grossly intact   Attention/Concentration: no gross abnormalities   Language:  average based on verbal fluency and interaction   Fund of knowledge and Intellect: average based on interview and verbal fluency     MEDICAL DECISION MAKING     Assessment:  Sheri Castillo is a 35 year old female with a history of mood disorder who presents to clinic for a follow up appointment.  Patient does appear depressed today and based upon her past psychiatric history, she has not  tolerated SSRI and SNRI medications in the past.  She is taking bupropion currently and doesn't feel she can tolerate the stomach upset associated.  Will try Lamictal today, discussed risks/benefits/alternatives, including the risk of Levonne Spiller Syndrome, patient voiced understanding and signed a medication consent today.  I do think it is possible her depression is part of a bipolar spectrum, as she does have cluster B traits and may be more on the "borderline organized" end of the bipolar spectrum, which may explain why she hasn't had good results  from antidepressants in the past.  Will trial this medication to treat a possible bipolar depression component.    A comprehensive suicide risk assessment was performed and the patient was assessed to be at a low acute risk of self-harm.  Modifiable risk factors include precipitating stressors.  Non-modifiable risk factors include previous suicide attempts and existing psychiatric diagnoses.  The patient also has protective factors of future life plans, coping skills, therapeutic relationships and access to health care.      Diagnostic Impression/Status:   Unspecified mood disorder: Rule out bipolar II disorder, rule out MDD, rule out BPD    Plan/Recommendations:  Intervention/Psychotherapy:   - Provided psychoeducation, supportive therapy and empathic listening today.  - Discussed suicide and emergency room precautions.     Medication:   - Start lamotrigine  daily for 2 weeks, then increase to  daily thereafter.  - Continue propranolol  BID PRN anxiety  - Discussed risks/benefits/alteratives to medication, patient voiced understanding.    Labs/Radiology/Tests/Consultation: Will get labs at next appointment.    Disposition: Return Visit 4 weeks with resident clinic.    Time out: 9604    Alfredo Martinez, MD  Staff Psychiatrist  [facility]

## 2016-04-01 ENCOUNTER — Encounter (INDEPENDENT_AMBULATORY_CARE_PROVIDER_SITE_OTHER): Payer: Self-pay | Admitting: Psychiatry

## 2016-04-01 ENCOUNTER — Encounter (INDEPENDENT_AMBULATORY_CARE_PROVIDER_SITE_OTHER): Payer: 59 | Admitting: Psychiatry

## 2016-04-01 NOTE — Progress Notes (Signed)
Patient was no show to OPS appointment on 8/24 at 16:00.

## 2016-04-02 ENCOUNTER — Telehealth (INDEPENDENT_AMBULATORY_CARE_PROVIDER_SITE_OTHER): Payer: Self-pay | Admitting: Psychiatry

## 2016-04-02 NOTE — Telephone Encounter (Signed)
The pt is requesting call from Dr. Obie DredgeLangley.    Sheri Castillo  [facility]

## 2016-04-14 ENCOUNTER — Telehealth (INDEPENDENT_AMBULATORY_CARE_PROVIDER_SITE_OTHER): Payer: Self-pay | Admitting: Psychiatry

## 2016-04-14 NOTE — Telephone Encounter (Signed)
Prescription Refill Request Received via phone.     Last visit Date: 03/04/16-Dr [facility]    Next Visit Date: 04[facility]28/16[facility]    Best Call back #: [phone] to leave detail message on voicemail     Medication Requested:   1. Medication Requested:propranolol (INDERAL) 40 MG tablet   Last Fill: 03/04/16                 Is pharmacy listed in patients demographics? Yes   If not please update patient's pharmacy demographics.  [facility] [address] - [place], [place] - [phone] [place] [phone]

## 2016-04-15 NOTE — Telephone Encounter (Signed)
Patient has two refills remaining in her pharmacy.  Will not refill at this time.

## 2016-04-28 ENCOUNTER — Encounter (INDEPENDENT_AMBULATORY_CARE_PROVIDER_SITE_OTHER): Payer: 59 | Admitting: Psychiatry

## 2016-05-12 ENCOUNTER — Encounter (INDEPENDENT_AMBULATORY_CARE_PROVIDER_SITE_OTHER): Payer: 59 | Admitting: Psychiatry

## 2016-05-12 ENCOUNTER — Encounter (INDEPENDENT_AMBULATORY_CARE_PROVIDER_SITE_OTHER): Payer: Self-pay | Admitting: Psychiatry

## 2016-05-12 NOTE — Progress Notes (Signed)
Patient was no-show to OPS appointment at 16:00 today.

## 2016-08-12 ENCOUNTER — Telehealth (INDEPENDENT_AMBULATORY_CARE_PROVIDER_SITE_OTHER): Payer: Self-pay | Admitting: Psychiatry

## 2016-08-12 DIAGNOSIS — F331 Major depressive disorder, recurrent, moderate: Principal | ICD-10-CM

## 2016-08-12 MED ORDER — PROPRANOLOL HCL 40 MG OR TABS
40.0000 mg | ORAL_TABLET | Freq: Two times a day (BID) | ORAL | 0 refills | Status: DC
Start: 2016-08-12 — End: 2016-08-16

## 2016-08-12 NOTE — Telephone Encounter (Signed)
Prescription Refill Request Received via phone. Patient called requesting a refill, patient states she will be out of medication by the time of the appointment.     Last visit Date:03/04/2016    Next Visit Date: 09/20/2016    Best Call back #:[phone] (M)    Medication Requested:   1. Medication Requested:      propranolol (INDERAL) 40 MG tablet        Sig - Route: Take 1 tablet (40 mg) by mouth 2 times daily. - Oral            Pharmacy listed in patients demographics  Pharmacy[facility]  [facility] [address] - [place] [facility] [phone] [facility]

## 2016-08-16 ENCOUNTER — Telehealth (INDEPENDENT_AMBULATORY_CARE_PROVIDER_SITE_OTHER): Payer: Self-pay | Admitting: Psychiatry

## 2016-08-16 DIAGNOSIS — F331 Major depressive disorder, recurrent, moderate: Principal | ICD-10-CM

## 2016-08-16 MED ORDER — PROPRANOLOL HCL 40 MG OR TABS
40.0000 mg | ORAL_TABLET | Freq: Two times a day (BID) | ORAL | 0 refills | Status: DC
Start: 2016-08-16 — End: 2016-09-20

## 2016-08-16 NOTE — Telephone Encounter (Signed)
The pt stated she is unable to pick up her Rx propranolol (INDERAL) 40 MG tablet. The pharmacy told her the insurance will only cover a 90 day supply. Can the Rx be changed to 90 days with the pharmacy?    Higinio RogerFlynn Anderson  Psych Call Center

## 2016-09-20 ENCOUNTER — Ambulatory Visit (INDEPENDENT_AMBULATORY_CARE_PROVIDER_SITE_OTHER): Payer: 59 | Admitting: General Practice

## 2016-09-20 ENCOUNTER — Encounter (INDEPENDENT_AMBULATORY_CARE_PROVIDER_SITE_OTHER): Payer: 59 | Admitting: Psychiatry

## 2016-09-20 VITALS — BP 127/73 | HR 66 | Resp 16 | Ht 66.0 in | Wt 195.0 lb

## 2016-09-20 DIAGNOSIS — F1290 Cannabis use, unspecified, uncomplicated: Secondary | ICD-10-CM

## 2016-09-20 DIAGNOSIS — F39 Unspecified mood [affective] disorder: Principal | ICD-10-CM

## 2016-09-20 MED ORDER — PROPRANOLOL HCL 40 MG OR TABS
40.0000 mg | ORAL_TABLET | Freq: Two times a day (BID) | ORAL | 0 refills | Status: DC
Start: 2016-09-20 — End: 2017-01-04

## 2016-09-20 NOTE — Progress Notes (Signed)
PGY-3 Psychiatry Resident Follow Up Appointment    This psychiatric service is being provided under [facility] Resident Supervisory Model. Under this model, a fully licensed Psychiatry Resident Physician (MD/DO) is providing medical service under a supervising Attending Psychiatrist. The Supervisory Model is a team approach in an academic center which provides comprehensive and empirically supported care to patients. Under this model of care, a supervising Attending Psychiatrist oversees care, accesses medical records, is available on-site in the clinic for consultation with the Psychiatry Resident Physician, and, as indicated may see the patient in person.    Patient name: Sheri Castillo  Date: 09/20/16  Time: 9:30-10:10  Clinic: [facility]     HISTORY     Chief Complaint: Medication Management Depression & Anxiety - "I'm not yet at the point where I need another medication"    HPI: DEBIE Castillo is a 36 year old [place] female with a history of MDD vs Unspecified Mood disorder, Anxiety, Borderline traits, and Cannabis Use presenting for psychiatric follow-up and to establish care with a new psychiatric provider.    Patient reports she had a rash after starting Lamictal with Dr. Theodoro Doing and thus stopped taking the medication. Reports that Propranolol has helped "perfectly with anxiety", has stopped her tremors, and "is probably the best medicine I've ever taken". 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 States her insurance has rejected this medication in the past when she gets less than a 90 day supply.    Reports now that she is working at the [employer] since December working either 8:30 or 10:00 PM to 5:30 or 6:30 AM. 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 Reports that she is able to sleep during the day  when she gets home, but laments that she and her wife don't go out and do more things together on their days off.     Reports her mood fluctuates a lot. States her mood is better when she's busy, but then when there's downtime she feels hollow. Has times where she feels like she wants to say "F*&k it" with regards to continuing to live, but states she doesn't want to die". Reports she hasn't told her wife because she doesn't want to worry her. Reports that what helps her the most is thinking about everything she's gained. States she's not ready to take another medication because of the potential for side effects, but that she will continue to consider this in addition to therapy. However, endorses that she can't promise that she will definitely go to therapy because of the limited amount of time in her schedule.      Past Psychiatric, Family, & Social History Updates:   Denies psychiatric hospitalization or OD attempt since 2016.    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 Earns $17 an hour.  Has a Environmental consultant      Past Medical History:  Patient Active Problem List   Diagnosis    Unspecified mood (affective) disorder (CMS-HCC)    Cannabis use, uncomplicated    Obsessive compulsive personality disorder  Allergies:   No Known Allergies    Medications:  Current Outpatient Prescriptions on File Prior to Visit   Medication Sig Dispense Refill    lamoTRIgine (LAMICTAL) 25 MG tablet Take 1 tab (25mg ) daily for 2 weeks, then 2 tabs (50mg ) daily thereafter 42 tablet 0    propranolol (INDERAL) 40 MG tablet Take 1 tablet (40 mg) by mouth 2 times daily. 180 tablet 0     No current facility-administered medications on file prior to visit.      The above medication list was reviewed with the patient who reports  taking:  Propranolol 40 mg BID    Review of Systems Update:  Review of Systems   Constitutional: Positive for malaise/fatigue.   Cardiovascular: Negative for palpitations.   Gastrointestinal: Negative for abdominal pain and nausea.   Musculoskeletal: Negative for myalgias.   Neurological: Negative for headaches.   Psychiatric/Behavioral: Positive for depression.         PSYCHIATRIC SPECIALTY EXAMINATION     Vital Signs:   Blood pressure 127/73, pulse 66, resp. rate 16, height 5\' 6"  (1.676 m), weight 88.5 kg (195 lb), SpO2 97 %.    Psychometric Scales:  [place] PHQ9 DEPRESSION QUESTIONNAIRE 10/13/2015 03/04/2016   Interest 3 3   Depressed 1 3   Sleep 0 0   Energy 1 3   Appetite 0 0   Failure 3 3   Concentration 1 2   Movement 0 0   Suicide 1 3   Summary(Manual) -- --   Summary(Calculated) 10 17   Functional 1 Somewhat difficult   Some recent data might be hidden     GAD 7 10/13/2015 03/04/2016   Feeling afraid as if something awful might happen 0 2   Being easily annoyed or irritable 1 1   Worrying too much about different things 3 2   If you checked off any problems, how difficult have these problems made it for you to do your job along with other people? Not difficult at all Somewhat difficult   Feeling nervous, anxious or on edge 1 2   Trouble relaxing 1 2   Being so restless that it is hard to sit still 0 1   GAD7 Patient Total 9 12   Not being able to stop or control worrying 3 2   Some recent data might be hidden       Pertinent Studies/Labs:   None recent    Mental Status Examination:   Appearance: Overweight Hispanic Female with dark hair pulled back, wearing sweatshirt and fatigue shorts.  Behavior: Cooperative with questioning and appropriate eye contact. Sitting on far chair.  Motor/Abnormal Involuntary Movements: No PMA/PMR or other involuntary movements noted  Gait: No abnormality   Speech: Accented, normal rate, tone, and volume  Mood: "ok now, sometimes really hollow"  Affect: dysphoric   Thought Process:  Coherent, somewhat illogical   Associations: linear, goal-directed   Thought Content: Passive SI as per HPI, denied homicidal thoughts; no violent ideations  Perceptions: Illusions, auditory, visual, tactile hallucinations were denied  Insight/Judgment: limited-fair/fair  Orientation: AAO x4  Memory: recent and remote memory grossly intact   Attention/Concentration: no gross abnormalities   Language: average based on verbal fluency and interaction   Fund of knowledge and Intellect: average based on interview and verbal fluency    MEDICAL DECISION MAKING     Assessment:  36 year old [place] female with a history of MDD vs Unspecified Mood disorder, Anxiety, Borderline traits, and Cannabis  Use presenting for psychiatric follow-up and to establish care with a new psychiatric provider.    While patient is presenting as dysphoric and endorsing anhedonia and depressed mood with hollowness, suspect that much of her mood symptoms are secondary to current night shift schedule with the [employer]. Maintains that propranolol has been the most stabilizing medication in terms of easing her anxiety and allowing her to function at work. Discussed again that depressive symptoms would likely benefit from an antidepressant and will continue to discuss Celexa or Lexapro. Has not tolerated multiple SSRI and SNRI medications as well as Lithium and Lamictal. Discussed possible referral for group therapy, though patient reports she will continue to think about this. Aware of ED and crisis line precautions. Though at somewhat elevated chronic risk for self harm with previous history of attempts when ability to cope is stressed, more future oriented at his time than in previous visits with improved employment and living situation.    A comprehensive suicide risk assessment was performed and the patient was assessed to be at a low acute risk of self-harm.  Modifiable risk factors include suicidality (manifested by suicidal  ideation), precipitating stressors and worsening symptoms of anhedonia and despair.  Non-modifiable risk factors include previous suicide attempts, existing psychiatric diagnoses and history of childhood trauma.  The patient also has protective factors of future life plans, coping skills, responsibility to pets and access to health care.      Diagnostic Impression:   Unspecified mood disorder with anxious distress: Rule out MDD vs bipolar II disorder  Cannabis use  Borderline traits    Problem/Condition:  Status: stable anxiety, worsening mood  Comorbidities: denies side effects with propranolol    Plan/Recommendations:  Intervention/Psychotherapy: Psychoeducation and supportive therapy provided.  - Will continue to discuss individual psychotherapy vs groups at subsequent visits    Medication: Continue Propranolol 40 mg BID for anxiety, #180, requesting 90 day supply for insurance    Labs/Radiology/Tests/Consultation: none    Other: Ran CURES on 09/20/16, Rx for Hydrocodone-Acetaminophen 5-325 mg #30 on 12/05/15.    Informed Consent/Confidentiality: R/B/A and possible ASEs of prescribed treatments were discussed with the patient who consented to the treatment plan.  Limits of confidentiality were reviewed at the beginning of today's intake appt.     Disposition: Return Visit 6-8 weeks. Appointment scheduled 11/22/16 at 8:00 AM.    Joseph Pierini, MD PhD  PGY3 Psychiatry  [facility]    This psychiatric service has been provided under [employer] Resident Supervisory Model. The care has been discussed and the note reviewed by the supervising Attending Psychiatrist: Dr. Chestine Spore. Please refer to attached addendum for the supervisors attested note.    Attending Psychiatrist Attestation Note 09/28/16    This psychiatric service is being provided under [employer] Resident Supervisory Model. Under this model, a fully licensed Psychiatry Resident Physician (MD/DO) is providing medical service under a supervising Attending Psychiatrist.  The Supervisory Model is a team approach in an academic center which provides comprehensive and empirically supported care to patients. Under this model of care, I have access to the medical records, and was available on-site to discuss the patient visit with the resident and to see the patient if indicated. Please see below for specific comments on the service being provided at todays session.    Additional comments:   I have seen and examined the patient and reviewed the history, presentation, progress, and diagnosis, with the resident physician. I agree with the findings and plan as documented with additional  comments made below, and I participated in the medical decision making with the resident physician.    Patient with h/o mood disorder, cannabis use and borderline traits.  Agree with plan to continue propranolol 40mg  bid which is helping with anxiety.    Floy Sabina, MD  Attending Staff Psychiatrist  [facility]

## 2016-11-22 ENCOUNTER — Encounter (INDEPENDENT_AMBULATORY_CARE_PROVIDER_SITE_OTHER): Payer: 59 | Admitting: General Practice

## 2016-11-30 ENCOUNTER — Encounter (INDEPENDENT_AMBULATORY_CARE_PROVIDER_SITE_OTHER): Payer: Self-pay | Admitting: General Practice

## 2017-01-04 ENCOUNTER — Ambulatory Visit (INDEPENDENT_AMBULATORY_CARE_PROVIDER_SITE_OTHER): Payer: Commercial Managed Care - PPO | Admitting: General Practice

## 2017-01-04 DIAGNOSIS — F39 Unspecified mood [affective] disorder: Principal | ICD-10-CM

## 2017-01-04 MED ORDER — PROPRANOLOL HCL 40 MG OR TABS
40.0000 mg | ORAL_TABLET | Freq: Two times a day (BID) | ORAL | 0 refills | Status: AC
Start: 2017-01-04 — End: 2017-04-04

## 2017-01-04 NOTE — Progress Notes (Signed)
PGY-3 Psychiatry Resident Follow Up Appointment    This psychiatric service is being provided under [employer] Resident Supervisory Model. Under this model, a fully licensed Psychiatry Resident Physician (MD/DO) is providing medical service under a supervising Attending Psychiatrist. The Supervisory Model is a team approach in an academic center which provides comprehensive and empirically supported care to patients. Under this model of care, a supervising Attending Psychiatrist oversees care, accesses medical records, is available on-site in the clinic for consultation with the Psychiatry Resident Physician, and, as indicated may see the patient in person.    Patient name: Sheri Castillo  Date: 01/04/17  Time: 13:30-14:00  Clinic: [facility]     HISTORY     Chief Complaint: Medication Management Depression & Anxiety - "I'm not yet at the point where I need another medication"    HPI: Sheri Castillo is a 36 year old [place] female with a history of MDD vs Unspecified Mood disorder, Anxiety, Borderline traits, and Cannabis Use presenting for psychiatric follow-up.    Reports that she's still working night shifts, and has had to work the last 6 nights in a row. 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 Usually sleeps from 11 AM until 5 PM. States she sometimes has a little bit of trouble sleeping during the day. Will smoke Cannabis if she's not tired, but otherwise refrains to not be to sedated for work. Is trying to go outside more - likes to grill. Also pushing herself to go to the gym    Mood has been feeling "down lately". Anxious recently about finding out at doctor's appointment that she had trichomaniasis. States her wife has been suspicious of this being a sexually transmitted disease. States that it's been years since she was "in a relationship with a guy". And is not sure how she contracted trichomaniasis. Reassured that this bacteria is not always sexually transmitted.    Does  think things are overall better with her life. Has been talking about adoption with her wife and feels that this would give her more meaning. Does not feel that she needs any other medication.       Past Psychiatric, Family, & Social History Updates:   2-3 bowl cannabis per week      Past Medical History:  Patient Active Problem List   Diagnosis    Unspecified mood (affective) disorder (CMS-HCC)    Cannabis use, uncomplicated    Obsessive compulsive personality disorder       Allergies:   No Known Allergies    Medications:  Current Outpatient Prescriptions on File Prior to Visit   Medication Sig Dispense Refill    propranolol (INDERAL) 40 MG tablet Take 1 tablet (40 mg) by mouth 2 times daily. 180 tablet 0     No current facility-administered medications on file prior to visit.      The above medication list was reviewed with the patient who reports taking:  Propranolol 40 mg BID    Review of Systems Update:  Review of Systems   Constitutional: Positive for malaise/fatigue.   Cardiovascular: Negative for palpitations.   Gastrointestinal: Negative for abdominal pain and nausea.   Musculoskeletal: Negative for myalgias.   Neurological: Negative for headaches.   Psychiatric/Behavioral: Positive for depression.         PSYCHIATRIC SPECIALTY EXAMINATION     Vital Signs:   Blood pressure 122/75, pulse 81, resp. rate 16, height 5\' 6"  (1.676 m), weight 89.4 kg (197 lb).  Psychometric Scales:  [place] PHQ9 DEPRESSION QUESTIONNAIRE 10/13/2015 03/04/2016 01/04/2017   Interest 3 3 1    Depressed 1 3 1    Sleep 0 0 2   Energy 1 3 2    Appetite 0 0 0   Failure 3 3 1    Concentration 1 2 1    Movement 0 0 0   Suicide 1 3 1    Summary(Manual) -- -- --   Summary(Calculated) 10 17 9    Functional 1 Somewhat difficult Somewhat difficult   Some recent data might be hidden     GAD 7 10/13/2015 03/04/2016 01/04/2017   Feeling afraid as if something awful might happen 0 2 1   Being easily annoyed or irritable 1 1 1    Worrying too much about different  things 3 2 1    If you checked off any problems, how difficult have these problems made it for you to do your job along with other people? Not difficult at all Somewhat difficult Somewhat difficult   Feeling nervous, anxious or on edge 1 2 1    Trouble relaxing 1 2 1    Being so restless that it is hard to sit still 0 1 1   GAD7 Patient Total 9 12 7    Not being able to stop or control worrying 3 2 1    Some recent data might be hidden       Pertinent Studies/Labs:   None recent    Mental Status Examination:   Appearance: Overweight Hispanic Female with dark hair pulled back, wearing sweatshirt and shorts.  Behavior: Cooperative with questioning and appropriate eye contact. Comfortably seated on couch.  Motor/Abnormal Involuntary Movements: No PMA/PMR or other involuntary movements noted  Gait: No abnormality   Speech: Accented, normal rate, tone, and volume  Mood: "kinda down"  Affect: fatigued, mildly dysphoric   Thought Process: Coherent, logical  Associations: linear, goal-directed   Thought Content: denied suicidal thoughts, denied homicidal thoughts; no violent ideations  Perceptions: Illusions, auditory, visual, tactile hallucinations were denied  Insight/Judgment: limited-fair/fair  Orientation: AAO x4  Memory: recent and remote memory grossly intact   Attention/Concentration: no gross abnormalities   Language: average based on verbal fluency and interaction   Fund of knowledge and Intellect: average based on interview and verbal fluency    MEDICAL DECISION MAKING     Assessment:  36 year old [place] female with a history of MDD vs Unspecified Mood disorder, Anxiety, Borderline traits, and Cannabis Use presenting for psychiatric follow-up and to establish care with a new psychiatric provider.    Patient is presenting with ongoing dysphoria, and fatigue, though no longer complaining of frank anhedonia with trying to go the gym. Suspect that much of her mood symptoms are secondary to current night shift  schedule with the [employer]. Future oriented with regards to possible adoption. Notable improvement both in PHQ9 & GAD7 scores. Maintains that propranolol has been the most stabilizing medication in terms of easing her anxiety and allowing her to function at work. Has not tolerated multiple SSRI and SNRI medications as well as Lithium and Lamictal.  Though at somewhat elevated chronic risk for self harm with previous history of attempts when ability to cope is stressed, more future oriented at his time than in previous visits with improved employment and living situation.    A comprehensive suicide risk assessment was performed and the patient was assessed to be at a low acute risk of self-harm.  Modifiable risk factors include precipitating stressors and worsening symptoms of global  insomnia.  Non-modifiable risk factors include previous suicide attempts, existing psychiatric diagnoses and history of childhood trauma.  The patient also has protective factors of future life plans, coping skills, responsibility to pets and access to health care.      Diagnostic Impression:   Unspecified mood disorder with anxious distress: Rule out MDD vs bipolar II disorder  Cannabis use  Borderline traits    Problem/Condition:  Status: stable anxiety, worsening mood  Comorbidities: denies side effects with propranolol    Plan/Recommendations:  Intervention/Psychotherapy: Psychoeducation and supportive therapy provided.  - Will continue to discuss individual psychotherapy vs groups at subsequent visits    Medication: Continue Propranolol 40 mg BID for anxiety, #180, requesting 90 day supply for insurance    Labs/Radiology/Tests/Consultation: none    Other: None    Informed Consent/Confidentiality: R/B/A and possible ASEs of prescribed treatments were discussed with the patient who consented to the treatment plan. Limits of confidentiality were reviewed at the beginning of today's intake appt.     Disposition: Return Visit 10-12  weeks.    Joseph Pierini, MD PhD   PGY3 Psychiatry   [facility]     This psychiatric service has been provided under [employer] Resident Supervisory Model. The care has been discussed and the note reviewed by the supervising Attending Psychiatrist: Dr. Eather Colas. Please refer to attached addendum for the supervisors attested note.

## 2017-01-11 ENCOUNTER — Telehealth (INDEPENDENT_AMBULATORY_CARE_PROVIDER_SITE_OTHER): Payer: Self-pay | Admitting: General Practice

## 2017-01-11 NOTE — Telephone Encounter (Signed)
Pt is requesting a call back from Dr Clearence CheekFriend. Pt stated her spouse is being transferred and they will be leaving June 28th. Pt is requesting enough refills to cover the time it takes her to find another psychiatrist. Please contact pt if able to assist in this.       Pt phone: [phone] to leave detail message on voicemail

## 2017-01-12 NOTE — Telephone Encounter (Signed)
Called Pt back & left VM to inform her that I can leave a handwritten Rx for a 90 day propranolol dated 3 months after her current Rx was started if that would be helpful. Patient currently was prescribed a 90 day supply starting on 01/04/17.

## 2017-01-14 NOTE — Progress Notes (Signed)
Attending Psychiatrist Attestation Note 01/14/17    This psychiatric service is being provided under [employer] Resident Supervisory Model. Under this model, a fully licensed Psychiatry Resident Physician (MD/DO) is providing medical service under a supervising Attending Psychiatrist. The Supervisory Model is a team approach in an academic center which provides comprehensive and empirically supported care to patients. Under this model of care, I have access to the medical records, and was available on-site to discuss the patient visit with the resident and to see the patient if indicated. The supervisory model has been verbally explained to the patient. Patient has the right to opt-in or opt-out of the model at anytime. Please see below for specific comments on the service being provided at todays session.    Additional comments:  Pt is seen and evaluated with resident physician. Medical decision making has been reviewed and discussed. Encounter note has been reviewed, read and edited for content. Pt is being seen for mood d/o (r/o MDD v bipolar 2). Has THC abuse and likely borderline traits. Consider 1:1 therapy v groups. Will be cont on propranolol 40mg  bid for anxiety. Will closely monitor and assess.    Joyice FasterSteve Tamatha Gadbois, MD  Attending Staff Psychiatrist  [facility]

## 2017-03-11 ENCOUNTER — Encounter: Payer: Self-pay | Admitting: Family Medicine

## 2017-03-11 ENCOUNTER — Ambulatory Visit (INDEPENDENT_AMBULATORY_CARE_PROVIDER_SITE_OTHER): Payer: PRIVATE HEALTH INSURANCE | Admitting: Family Medicine

## 2017-03-11 VITALS — BP 116/80 | HR 60 | Resp 12 | Ht 66.75 in | Wt 218.4 lb

## 2017-03-11 DIAGNOSIS — Z114 Encounter for screening for human immunodeficiency virus [HIV]: Secondary | ICD-10-CM

## 2017-03-11 DIAGNOSIS — Z6834 Body mass index (BMI) 34.0-34.9, adult: Secondary | ICD-10-CM | POA: Diagnosis not present

## 2017-03-11 DIAGNOSIS — I10 Essential (primary) hypertension: Secondary | ICD-10-CM

## 2017-03-11 DIAGNOSIS — E669 Obesity, unspecified: Secondary | ICD-10-CM

## 2017-03-11 DIAGNOSIS — F3130 Bipolar disorder, current episode depressed, mild or moderate severity, unspecified: Secondary | ICD-10-CM

## 2017-03-11 DIAGNOSIS — F319 Bipolar disorder, unspecified: Secondary | ICD-10-CM | POA: Insufficient documentation

## 2017-03-11 DIAGNOSIS — Z6835 Body mass index (BMI) 35.0-35.9, adult: Secondary | ICD-10-CM | POA: Insufficient documentation

## 2017-03-11 DIAGNOSIS — G4700 Insomnia, unspecified: Secondary | ICD-10-CM

## 2017-03-11 LAB — BASIC METABOLIC PANEL
BUN: 13 mg/dL (ref 6–23)
CO2: 29 meq/L (ref 19–32)
Calcium: 9.4 mg/dL (ref 8.4–10.5)
Chloride: 101 mEq/L (ref 96–112)
Creatinine, Ser: 0.91 mg/dL (ref 0.40–1.20)
GFR: 73.45 mL/min (ref >60.00)
GLUCOSE: 116 mg/dL — AB (ref 70–99)
POTASSIUM: 4.5 meq/L (ref 3.5–5.1)
Sodium: 136 mEq/L (ref 135–145)

## 2017-03-11 LAB — LIPID PANEL
CHOLESTEROL: 158 mg/dL (ref 0–200)
HDL: 46.4 mg/dL (ref >39.00)
LDL CALC: 95 mg/dL (ref 0–99)
NonHDL: 111.79
TRIGLYCERIDES: 86 mg/dL (ref 0.0–149.0)
Total CHOL/HDL Ratio: 3
VLDL: 17.2 mg/dL (ref 0.0–40.0)

## 2017-03-11 LAB — TSH: TSH: 3.68 u[IU]/mL (ref 0.35–4.50)

## 2017-03-11 MED ORDER — QUETIAPINE FUMARATE 100 MG PO TABS: 100.0000 mg | ORAL_TABLET | Freq: Every day | ORAL | 1 refills | Status: DC

## 2017-03-11 NOTE — Progress Notes (Signed)
HPI:   Sue Jackson is a 36 y.o. female, who is here today to establish care with me.  Former PCP: N/A. She recently moved from [place], about 3 weeks ago. Last preventive routine visit: Gyn preventive care 01/2017. Dx and treated for trich, re-test and reporting negative lab.  She lives with her wife of 7 years. 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   Chronic medical problems: Depression,HTN.  Hypertension:  Dx in 2008.  Currently on Propranolol 40 mg twice daily, which was initially recommended for anxiety. She has not had an eye exam in the past year. She has not been consistent with low salt diet.    She is taking medications as instructed, no side effects reported.  She has not noted unusual headache, visual changes, exertional chest pain, dyspnea,  focal weakness, or edema.   Concerns today: Mood disorder. She also would like labs done today. She is reporting a remote history of thyroid disease. 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  She denies history of diabetes or HLD. She does not exercise regularly and has not been consistent with a healthy diet.  Hx of depression and bipolar disorder. Currently she is not on treatment. Last time she follows with psychiatrist was in May 2018, Dr Denman George. According to patient medications were recommended but she was not interested in treatment. Diagnosed about 4 years ago. She has tried several medications, including lithium, Effexor, Prozac, Zoloft, Lexapro, Abilify, Celexa among some. Most medications she didn't tolerate well, mainly GI symptoms. Zoloft caused restlessness, she could "not stop moving" and exacerbated anxiety. Hx of suicidal attempt while she was on Effexor, took it for a year. She denies suicidal thoughts. + Insomnia, attributed to wife work schedule and the  fact she was used to working 3rd shift.   Review of Systems  Constitutional: Positive for fatigue. Negative for activity change, appetite change, fever and unexpected weight change.  HENT: Negative for mouth sores, nosebleeds and trouble swallowing.   Eyes: Negative for redness and visual disturbance.  Respiratory: Negative for cough, shortness of breath and wheezing.   Cardiovascular: Negative for chest pain, palpitations and leg swelling.  Gastrointestinal: Negative for abdominal pain, nausea and vomiting.       Negative for changes in bowel habits.  Endocrine: Negative for cold intolerance, heat intolerance, polydipsia, polyphagia and polyuria.  Genitourinary: Negative for decreased urine volume, dysuria, hematuria and vaginal discharge.  Musculoskeletal: Negative for gait problem and myalgias.  Skin: Negative for rash.  Neurological: Negative for syncope, weakness and headaches.  Hematological: Negative for adenopathy. Does not bruise/bleed easily.  Psychiatric/Behavioral: Positive for sleep disturbance. Negative for confusion, hallucinations and suicidal ideas. The patient is nervous/anxious.     No current outpatient prescriptions on file prior to visit.   No current facility-administered medications on file prior to visit.     Past Medical History:  Diagnosis Date  . Anxiety   . Arthritis   . Depression   . Hypertension    No Known Allergies  Family History  Problem Relation Age of Onset  . Arthritis Mother   . Asthma Mother   . Cancer Maternal Aunt        breast  . Depression Maternal Aunt   . Hypertension Maternal Aunt   . Diabetes Maternal Grandfather   . Heart disease Maternal  Grandfather   . Hypertension Maternal Grandfather   . Stroke Maternal Grandfather     Social History   Social History  . Marital status: Married    Spouse name: N/A  . Number of children: N/A  . Years of education: N/A   Social History Main Topics  . Smoking status: Former  Research scientist (life sciences)  . Smokeless tobacco: Never Used  . Alcohol use Yes  . Drug use: No  . Sexual activity: Yes    Birth control/ protection: None   Other Topics Concern  . None   Social History Narrative  . None    Vitals:   03/11/17 1121 03/11/17 1218  BP: 116/80   Pulse: (!) 57 60  Resp: 12    O2 sat at RA 98% Body mass index is 34.46 kg/m.   Physical Exam  Nursing note and vitals reviewed. Constitutional: She is oriented to person, place, and time. She appears well-developed. No distress.  HENT:  Head: Atraumatic.  Mouth/Throat: Oropharynx is clear and moist and mucous membranes are normal.  Eyes: Pupils are equal, round, and reactive to light. Conjunctivae and EOM are normal.  Neck: No tracheal deviation present. No thyroid mass and no thyromegaly present.  Cardiovascular: Normal rate and regular rhythm.   No murmur heard. Pulses:      Dorsalis pedis pulses are 2+ on the right side, and 2+ on the left side.  Respiratory: Effort normal and breath sounds normal. No respiratory distress.  GI: Soft. She exhibits no mass. There is no hepatomegaly. There is no tenderness.  Musculoskeletal: She exhibits no edema.  Lymphadenopathy:    She has no cervical adenopathy.  Neurological: She is alert and oriented to person, place, and time. She has normal strength. Coordination and gait normal.  Skin: Skin is warm. No erythema.  Psychiatric: Her mood appears anxious. She expresses no suicidal ideation.  Well groomed, good eye contact.    ASSESSMENT AND PLAN:   [name] was seen today for establish care.  Diagnoses and all orders for this visit:  Insomnia, unspecified type  We discussed possible etiologies, including bipolar disorder. Good sleep hygiene recommended. Seroquel 100 mg started today.  -     QUEtiapine (SEROQUEL) 100 MG tablet; Take 1 tablet (100 mg total) by mouth at bedtime. -     TSH  Hypertension, essential, benign  Adequately controlled. No changes in  current management. Mild bradycardia noticed initially, we discussed some side effects of Propranolol DASH-low salt diet recommended. Eye exam recommended annually. F/U in 4 months, before if needed.  -     Lipid panel -     Basic metabolic panel -     TSH  Bipolar disorder with depression [facility])  List of psychiatrist in the area given today. Strongly recommend establishing care with psychiatrist. She has tried several medications including Lithium and Abilify, reporting poor tolerance. After discussion of some side effects she agrees with trying Seroquel, low dose initially but can be treated by psychiatrist if appropriate. Instructed about warning signs.  -     QUEtiapine (SEROQUEL) 100 MG tablet; Take 1 tablet (100 mg total) by mouth at bedtime.  Class 1 obesity without serious comorbidity with body mass index (BMI) of 34.0 to 34.9 in adult, unspecified obesity type  We discussed benefits of wt loss as well as adverse effects of obesity. Consistency with healthy diet and physical activity recommended. Daily brisk walking for 15-30 min as tolerated.  Encounter for screening for HIV -  HIV antibody (with reflex)   At this time I have not received copies of her medical records from former PCP.    Maxon Kresse G. Martinique, MD  [facility]. [place] office.

## 2017-03-11 NOTE — Patient Instructions (Signed)
A few things to remember from today's visit:   Insomnia, unspecified type - Plan: QUEtiapine (SEROQUEL) 100 MG tablet, TSH  Hypertension, essential, benign - Plan: Lipid panel, Basic metabolic panel, TSH  Encounter for screening for HIV - Plan: HIV antibody (with reflex)  PSYCJHIATRIC OFFICES AND PSYCHIATRISTS IN THE AREA   When psychiatric evaluation is discussed or recommended referral is not necessary. This is a list of options around [place], [place] that you can call and arrange an appointment.  -[facility] [phone] [facility] [phone] -[facility] [phone]  Dr Hardie Shackleton [phone] Dr Alexander Bergeron, MD [phone] Dr Chucky May, MD [phone])   Dr Allayne Butcher. Marcelino Freestone, MD [phone] Dr Geralyn Flash A. Lugo  Dr Sheralyn Boatman, MD [phone])  Dr Fredonia Highland, MD [phone]   Please be sure medication list is accurate. If a new problem present, please set up appointment sooner than planned today.

## 2017-03-12 LAB — HIV ANTIBODY (ROUTINE TESTING W REFLEX): HIV: NONREACTIVE

## 2017-03-14 ENCOUNTER — Telehealth: Payer: Self-pay | Admitting: Family Medicine

## 2017-03-14 NOTE — Telephone Encounter (Signed)
Informed patient of results and patient verbalized understanding.  

## 2017-03-14 NOTE — Telephone Encounter (Signed)
° ° °  Pt call about her lab results and would like a call back

## 2017-04-04 ENCOUNTER — Encounter (INDEPENDENT_AMBULATORY_CARE_PROVIDER_SITE_OTHER): Payer: Commercial Managed Care - PPO | Admitting: Psychiatry

## 2017-05-16 ENCOUNTER — Other Ambulatory Visit: Payer: Self-pay | Admitting: Family Medicine

## 2017-05-16 DIAGNOSIS — I10 Essential (primary) hypertension: Secondary | ICD-10-CM

## 2017-05-26 ENCOUNTER — Ambulatory Visit (HOSPITAL_COMMUNITY): Payer: PRIVATE HEALTH INSURANCE | Admitting: Psychiatry

## 2017-07-06 ENCOUNTER — Ambulatory Visit (INDEPENDENT_AMBULATORY_CARE_PROVIDER_SITE_OTHER): Payer: PRIVATE HEALTH INSURANCE | Admitting: Family Medicine

## 2017-07-06 ENCOUNTER — Encounter: Payer: Self-pay | Admitting: Family Medicine

## 2017-07-06 VITALS — BP 125/72 | HR 68 | Temp 98.4°F | Resp 12 | Ht 66.75 in | Wt 234.0 lb

## 2017-07-06 DIAGNOSIS — M545 Low back pain, unspecified: Secondary | ICD-10-CM | POA: Insufficient documentation

## 2017-07-06 DIAGNOSIS — M549 Dorsalgia, unspecified: Secondary | ICD-10-CM

## 2017-07-06 DIAGNOSIS — G5601 Carpal tunnel syndrome, right upper limb: Secondary | ICD-10-CM | POA: Diagnosis not present

## 2017-07-06 DIAGNOSIS — G8929 Other chronic pain: Secondary | ICD-10-CM | POA: Insufficient documentation

## 2017-07-06 DIAGNOSIS — M79604 Pain in right leg: Secondary | ICD-10-CM | POA: Insufficient documentation

## 2017-07-06 DIAGNOSIS — M25531 Pain in right wrist: Secondary | ICD-10-CM | POA: Diagnosis not present

## 2017-07-06 MED ORDER — CELECOXIB 100 MG PO CAPS: 100.0000 mg | ORAL_CAPSULE | Freq: Two times a day (BID) | ORAL | 0 refills | Status: AC

## 2017-07-06 MED ORDER — CYCLOBENZAPRINE HCL 5 MG PO TABS: 5.0000 mg | ORAL_TABLET | Freq: Two times a day (BID) | ORAL | 1 refills | Status: DC | PRN

## 2017-07-06 NOTE — Patient Instructions (Signed)
A few things to remember from today's visit:   Carpal tunnel syndrome of right wrist  Low back pain radiating to right lower extremity  Upper back pain, chronic   Carpal Tunnel Syndrome Carpal tunnel syndrome is a condition that causes pain in your hand and arm. The carpal tunnel is a narrow area that is on the palm side of your wrist. Repeated wrist motion or certain diseases may cause swelling in the tunnel. This swelling can pinch the main nerve in the wrist (median nerve). Follow these instructions at home: If you have a splint:  Wear it as told by your doctor. Remove it only as told by your doctor.  Loosen the splint if your fingers: ? Become numb and tingle. ? Turn blue and cold.  Keep the splint clean and dry. General instructions  Take over-the-counter and prescription medicines only as told by your doctor.  Rest your wrist from any activity that may be causing your pain. If needed, talk to your employer about changes that can be made in your work, such as getting a wrist pad to use while typing.  If directed, apply ice to the painful area: ? Put ice in a plastic bag. ? Place a towel between your skin and the bag. ? Leave the ice on for 20 minutes, 2-3 times per day.  Keep all follow-up visits as told by your doctor. This is important.  Do any exercises as told by your doctor, physical therapist, or occupational therapist. Contact a doctor if:  You have new symptoms.  Medicine does not help your pain.  Your symptoms get worse. This information is not intended to replace advice given to you by your health care provider. Make sure you discuss any questions you have with your health care provider. Document Released: 07/15/2011 Document Revised: 01/01/2016 Document Reviewed: 12/11/2014 Elsevier Interactive Patient Education  2018 [employer].  Please be sure medication list is accurate. If a new problem present, please set up appointment sooner than planned  today.     Back pain is very common in adults.The cause of back pain is rarely dangerous and the pain often gets better over time even with no pharmacologic treatment.  The cause of your back pain may not be known. Some common causes of back pain include: 1. Strain of the muscles or ligaments supporting the spine. 2. Wear and tear (degeneration) of the spinal disks. 3. Arthritis. 4. Direct injury to the back.  For many people, back pain may return. Since back pain is rarely dangerous, most people can learn to manage this condition on their own.  HOME CARE INSTRUCTIONS Watch your back pain for any changes. The following actions may help to lessen any discomfort you are feeling:  1. Remain active. It is stressful on your back to sit or stand in one place for long periods of time. Do not sit, drive, or stand in one place for more than 30 minutes at a time. Take short walks on even surfaces as soon as you are able.Try to increase the length of time you walk each day.  2. Exercise regularly as directed by your health care provider. Exercise helps your back heal faster. It also helps avoid future injury by keeping your muscles strong and flexible.  3. Do not stay in bed.Resting more than 1-2 days can delay your recovery.  4. Pay attention to your body when you bend and lift. The most comfortable positions are those that put less stress on your recovering back.  5.  Always use proper lifting techniques, including: Bending your knees. Keeping the load close to your body. Avoiding twisting.  6. Find a comfortable position to sleep. Use a firm mattress and lie on your side with your knees slightly bent. If you lie on your back, put a pillow under your knees.  7. Over the counter rubbing medications like Icy Hot or Asper cream with Lidocaine may help without significant side effects.  Acetaminophen and/or Aleve/Ibuprofen can be taken if  needed and if not contraindications. Local ice and heat may be alternated to reduce pain and spasms. Also massage and even chiropractor treatment.      Muscle relaxants might or might not help, they cause drowsiness among other    side effects. They could also interact with some of medications you may be already taking (medications for depression/anxiety and some pain medications).   8. Maintain a healthy weight. Excess weight puts extra stress on your back and makes it difficult to maintain good posture.   SEEK MEDICAL CARE IF: worsening pain, associated fever, rash/edema on area, pain going to legs or buttocks, numbness/tingling, night pain, or abnormal weight loss.    SEEK IMMEDIATE MEDICAL CARE IF:  1. You develop new bowel or bladder control problems. 2. You have unusual weakness or numbness in your arms or legs. 3. You develop nausea or vomiting. 4. You develop abdominal pain. 5. You feel faint.     Back Exercises The following exercises strengthen the muscles that help to support the back. They also help to keep the lower back flexible. Doing these exercises can help to prevent back pain or lessen existing pain. If you have back pain or discomfort, try doing these exercises 2-3 times each day or as told by your health care provider. When the pain goes away, do them once each day, but increase the number of times that you repeat the steps for each exercise (do more repetitions). If you do not have back pain or discomfort, do these exercises once each day or as told by your health care provider.   EXERCISES Single Knee to Chest Repeat these steps 3-5 times for each leg: 5. Lie on your back on a firm bed or the floor with your legs extended. 6. Bring one knee to your chest. Your other leg should stay extended and in contact with the floor. 7. Hold your knee in place by grabbing your knee or thigh. 8. Pull on your knee until you feel a gentle stretch in your lower back. 9. Hold the  stretch for 10-30 seconds. 10. Slowly release and straighten your leg.  Pelvic Tilt Repeat these steps 5-10 times: 2. Lie on your back on a firm bed or the floor with your legs extended. 3. Bend your knees so they are pointing toward the ceiling and your feet are flat on the floor. 4. Tighten your lower abdominal muscles to press your lower back against the floor. This motion will tilt your pelvis so your tailbone points up toward the ceiling instead of pointing to your feet or the floor. 5. With gentle tension and even breathing, hold this position for 5-10 seconds.  Cat-Cow Repeat these steps until your lower back becomes more flexible: 1. Get into a hands-and-knees position on a firm surface. Keep your hands under your shoulders, and keep your knees under  your hips. You may place padding under your knees for comfort. 2. Let your head hang down, and point your tailbone toward the floor so your lower back becomes rounded like the back of a cat. 3. Hold this position for 5 seconds. 4. Slowly lift your head and point your tailbone up toward the ceiling so your back forms a sagging arch like the back of a cow. 5. Hold this position for 5 seconds.   Press-Ups Repeat these steps 5-10 times: 6. Lie on your abdomen (face-down) on the floor. 7. Place your palms near your head, about shoulder-width apart. 8. While you keep your back as relaxed as possible and keep your hips on the floor, slowly straighten your arms to raise the top half of your body and lift your shoulders. Do not use your back muscles to raise your upper torso. You may adjust the placement of your hands to make yourself more comfortable. 9. Hold this position for 5 seconds while you keep your back relaxed. 10. Slowly return to lying flat on the floor.   Bridges Repeat these steps 10 times: 1. Lie on your back on a firm surface. 2. Bend your knees so they are pointing toward the ceiling and your feet are flat on the  floor. 3. Tighten your buttocks muscles and lift your buttocks off of the floor until your waist is at almost the same height as your knees. You should feel the muscles working in your buttocks and the back of your thighs. If you do not feel these muscles, slide your feet 1-2 inches farther away from your buttocks. 4. Hold this position for 3-5 seconds. 5. Slowly lower your hips to the starting position, and allow your buttocks muscles to relax completely. If this exercise is too easy, try doing it with your arms crossed over your chest.

## 2017-07-06 NOTE — Progress Notes (Signed)
ACUTE VISIT   HPI:  Chief Complaint  Patient presents with  . Right hand pain    [name] is a 36 y.o. female, who is here today complaining of a month of right hand pain and numbness.  Wrist pain and hand numbness that started after working for just one day in [place]. 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 She started symptoms the same night. No edema or erythema.  Numbness dorsal/radial aspect of middle finger and numbness on palm. Symptoms are mainly at night, alleviated some by shaking hand.  Sometimes pain on forearm, radial aspect. Achy like pain, intermittent, at night 8/10. No limitation of movement but it causes pain. Denies worsening on chronic cervical pain. She has not tried OTC.  She denies prior Hx of similar symptoms. No trauma.  Right handed.  She is also requesting "pain medication", Hx of OA of lumbar and cervical. She was Dx a few years ago, she followed with chiropractor. She has medication left,  Flexeril 5 mg. She has also been on Naproxen and Hydrocodone in the past.  Mid and lower bilateral back pain, like spasms.  Lower back pain sometimes radiated to RLE, posterior aspect of thigh. No numbness or tingling on LE's No saddle anesthesia or urine/bowel incontinence. Constant,achy pain, 4/10 now but it can be 8/10.  Currently she is not taking OTC medication. Last X ray was about 2 years ago.  No ortho evaluation in the past.    Review of Systems  Constitutional: Negative for activity change, appetite change, fatigue, fever and unexpected weight change.  HENT: Negative for mouth sores, sore throat and trouble swallowing.   Eyes: Negative for redness and visual disturbance.  Respiratory: Negative for cough, shortness of breath and wheezing.   Cardiovascular: Negative for leg swelling.  Gastrointestinal: Negative for abdominal pain, blood in stool, nausea and vomiting.       Negative  for changes in bowel habits.  Endocrine: Negative for cold intolerance and heat intolerance.  Genitourinary: Negative for decreased urine volume, dysuria, hematuria, vaginal bleeding and vaginal discharge.  Musculoskeletal: Positive for arthralgias, back pain and neck pain. Negative for joint swelling.  Skin: Negative for rash.  Neurological: Positive for numbness. Negative for weakness and headaches.  Hematological: Negative for adenopathy. Does not bruise/bleed easily.  Psychiatric/Behavioral: Negative for confusion. The patient is nervous/anxious.       Current Outpatient Medications on File Prior to Visit  Medication Sig Dispense Refill  . propranolol (INDERAL) 40 MG tablet TAKE 1 TABLET (40 MG) BY MOUTH 2 TIMES DAILY. 180 tablet 1  . Methylcobalamin (B-12) 5000 MCG TBDP Take 1 tablet by mouth daily.    . QUEtiapine (SEROQUEL) 100 MG tablet Take 1 tablet (100 mg total) by mouth at bedtime. (Patient not taking: Reported on 07/06/2017) 30 tablet 1   No current facility-administered medications on file prior to visit.      Past Medical History:  Diagnosis Date  . Anxiety   . Arthritis   . Depression   . Hypertension    No Known Allergies  Social History   Socioeconomic History  . Marital status: Married    Spouse name: None  . Number of children: None  . Years of education: None  . Highest education level: None  Social Needs  . Financial resource strain: None  . Food insecurity - worry: None  . Food insecurity - inability: None  . Transportation needs - medical: None  .  Transportation needs - non-medical: None  Occupational History  . None  Tobacco Use  . Smoking status: Former Research scientist (life sciences)  . Smokeless tobacco: Never Used  Substance and Sexual Activity  . Alcohol use: Yes  . Drug use: No  . Sexual activity: Yes    Birth control/protection: None  Other Topics Concern  . None  Social History Narrative  . None    Vitals:   07/06/17 0832  BP: 125/72  Pulse: 68    Resp: 12  Temp: 98.4 F (36.9 C)  SpO2: 99%   Body mass index is 36.92 kg/m.   Physical Exam  Nursing note and vitals reviewed. Constitutional: She is oriented to person, place, and time. She appears well-developed. She does not appear ill. No distress.  HENT:  Head: Normocephalic and atraumatic.  Eyes: Conjunctivae are normal.  Respiratory: Effort normal and breath sounds normal. No respiratory distress.  GI: Soft. She exhibits no mass. There is no hepatomegaly. There is no tenderness.  Musculoskeletal: She exhibits no edema.       Cervical back: She exhibits normal range of motion, no tenderness and no bony tenderness.       Thoracic back: She exhibits no tenderness and no bony tenderness.       Lumbar back: She exhibits spasm. She exhibits no tenderness and no bony tenderness.  No significant deformity appreciated. Tenderness upon palpation of lumbar paraspinal muscles, bilateral.  No local edema or erythema appreciated, no suspicious lesions. -Tinnel and Phalen negative bilateral. No muscle atrophy. Tenderness upon palpation of right radial styloid, no edema or erythema appreciated, no limitation of wrist ROM. Pain also elicited on radial styloid with Finkelstein maneuver.    Lymphadenopathy:    She has no cervical adenopathy.  Neurological: She is alert and oriented to person, place, and time. She has normal strength. Coordination and gait normal.  Reflex Scores:      Patellar reflexes are 2+ on the right side and 2+ on the left side. SLR negative bilateral. Able to walk on tip toes and heels with no pain or limitation.  Skin: Skin is warm. No rash noted. No erythema.  Psychiatric: Her mood appears anxious.  Well groomed, good eye contact.      ASSESSMENT AND PLAN:   [name] was seen today for right hand pain.  Diagnoses and all orders for this visit:  Carpal tunnel syndrome of right wrist  Educated about Dx. Night wrist splint with thumb recommended  (to also treat possible DeQuervain tenosynovitis). She is not interested in steroid injections for now.  -     celecoxib (CELEBREX) 100 MG capsule; Take 1 capsule (100 mg total) by mouth 2 (two) times daily for 10 days.  Low back pain radiating to right lower extremity - Chronic. Ortho evaluation recommended. Side effects of Flexeril discussed. Some PT/stretching exercises recommended, handout given. Instructed about warning signs.       Ambulatory referral to Orthopedic Surgery -     cyclobenzaprine (FLEXERIL) 5 MG tablet; Take 1 tablet (5 mg total) by mouth 2 (two) times daily as needed for muscle spasms.  Upper back pain, chronic  Flexeril may help. Local icy hot and massage may also help. Some side effects of NSAID's discussed.  -     Ambulatory referral to Orthopedic Surgery  Wrist pain, right  Finding also suggest tendosynovitis, DeQuervain. Splint with thumb recommended x 4-6 weeks. F/U as needed.    -Ms.Margene Cherian was advised to seek immediate medical attention if sudden  worsening symptoms or to follow if they persist or if new concerns arise.       Betty G. Martinique, MD  [facility]. [place] office.

## 2017-07-19 ENCOUNTER — Ambulatory Visit (INDEPENDENT_AMBULATORY_CARE_PROVIDER_SITE_OTHER): Payer: Self-pay | Admitting: Physical Medicine and Rehabilitation

## 2017-08-10 ENCOUNTER — Ambulatory Visit (INDEPENDENT_AMBULATORY_CARE_PROVIDER_SITE_OTHER): Payer: Self-pay | Admitting: Physical Medicine and Rehabilitation

## 2017-08-11 ENCOUNTER — Ambulatory Visit: Payer: Self-pay | Admitting: Adult Health

## 2017-08-11 DIAGNOSIS — Z0289 Encounter for other administrative examinations: Secondary | ICD-10-CM

## 2017-08-25 ENCOUNTER — Ambulatory Visit (INDEPENDENT_AMBULATORY_CARE_PROVIDER_SITE_OTHER): Payer: 59 | Admitting: Physical Medicine and Rehabilitation

## 2017-08-25 ENCOUNTER — Encounter (INDEPENDENT_AMBULATORY_CARE_PROVIDER_SITE_OTHER): Payer: Self-pay | Admitting: Physical Medicine and Rehabilitation

## 2017-08-25 ENCOUNTER — Ambulatory Visit (INDEPENDENT_AMBULATORY_CARE_PROVIDER_SITE_OTHER): Payer: 59

## 2017-08-25 VITALS — BP 134/77 | HR 68 | Temp 98.2°F

## 2017-08-25 DIAGNOSIS — G8929 Other chronic pain: Secondary | ICD-10-CM | POA: Diagnosis not present

## 2017-08-25 DIAGNOSIS — M25531 Pain in right wrist: Secondary | ICD-10-CM

## 2017-08-25 DIAGNOSIS — R202 Paresthesia of skin: Secondary | ICD-10-CM

## 2017-08-25 DIAGNOSIS — M545 Low back pain: Secondary | ICD-10-CM | POA: Diagnosis not present

## 2017-08-25 MED ORDER — MELOXICAM 15 MG PO TABS: 15.0000 mg | ORAL_TABLET | Freq: Every day | ORAL | 0 refills | Status: DC

## 2017-08-25 MED ORDER — METHOCARBAMOL 500 MG PO TABS: 500.0000 mg | ORAL_TABLET | Freq: Three times a day (TID) | ORAL | 0 refills | Status: DC | PRN

## 2017-08-25 NOTE — Progress Notes (Deleted)
Pt states a spasmic pain from mid to lower back. Pt also states pain in right wrist and right hand with numbness in fingers at times. Pt states pain in back has ben going on for 4 years and symptoms in right hand and wrist has been going on for about 1 month. Pt states reaching and standing still for a long period of time pain in back gets worse, laying on the floor makes it better. Pt states laying down on either side makes it worse, but laying flat on her back makes it better. PCP Jordon, CTS versus Dequervain, spint, celebrex, back pain flexeril, [employer]. No PT

## 2017-09-12 ENCOUNTER — Other Ambulatory Visit (INDEPENDENT_AMBULATORY_CARE_PROVIDER_SITE_OTHER): Payer: Self-pay | Admitting: Physical Medicine and Rehabilitation

## 2017-09-12 ENCOUNTER — Encounter (INDEPENDENT_AMBULATORY_CARE_PROVIDER_SITE_OTHER): Payer: Self-pay | Admitting: Physical Medicine and Rehabilitation

## 2017-09-12 NOTE — Telephone Encounter (Signed)
Please advise 

## 2017-09-12 NOTE — Progress Notes (Signed)
Sue Jackson - 37 y.o. female MRN [id number]  Date of birth: 05-11-1981  Office Visit Note: Visit Date: 08/25/2017 PCP: Martinique, Betty G, MD Referred by: Martinique, Betty G, MD  Subjective: Chief Complaint  Patient presents with  . Lower Back - Pain  . Right Wrist - Pain  . Right Hand - Numbness   HPI: Sue Jackson is a 37 year old right-hand-dominant female who comes in today at the request of her primary care physician Dr. Betty Martinique.  She reports chronic worsening history of spasmic type mid to lower back pain.  This is really extending from the lower scapular region in the mid back down to the lower back.  She initially tries to relate this as well to pain in the right wrist and right hand with numbness in fingers.  She reports that most of her pain is across the lower back worse with standing and ambulating and again gets a lot of back spasms.  She reports that lying on the floor flat will actually ease the pain.  She also reports standing for any length of time really gives her a lot of back pain.  She also reports pain on the greater trochanters when she lies on either side.  She has had no specific injury or trauma.  She reports seeing a chiropractor at some point but it has been a while.  She has had various pain medications including hydrocodone in the past.  She was given Celebrex and Flexeril by Dr. Martinique.  These have not helped that much.  She reports the chiropractic care was in [place].  She denies to me any pain down the legs although to Dr. Doug Sou notes there were some pain in the right leg at times.  She reports mostly axial low back pain.  There is been no specific night pain or unintended weight loss.  No fevers chills or night sweats.  She said no prior lumbar surgery.  No prior lumbar injections.  She has had no recent imaging.  As a secondary issue she reports about 1 month of worsening right wrist and right hand pain with numbness and tingling.  Dr. Martinique was  questioning whether this was carpal tunnel syndrome versus de Quervain's tenosynovitis.  She has been given a splint.  She has not had electrodiagnostic studies.  She does not report any left-sided symptoms.  She does not really report frank radicular pain in the arms.  Her case is complicated by obesity as well as bipolar disorder.    Review of Systems  Constitutional: Negative for chills, fever, malaise/fatigue and weight loss.  HENT: Negative for hearing loss and sinus pain.   Eyes: Negative for blurred vision, double vision and photophobia.  Respiratory: Negative for cough and shortness of breath.   Cardiovascular: Negative for chest pain, palpitations and leg swelling.  Gastrointestinal: Negative for abdominal pain, nausea and vomiting.  Genitourinary: Negative for flank pain.  Musculoskeletal: Positive for back pain and joint pain. Negative for myalgias.  Skin: Negative for itching and rash.  Neurological: Positive for tingling. Negative for tremors, focal weakness and weakness.  Endo/Heme/Allergies: Negative.   Psychiatric/Behavioral: Negative for depression.  All other systems reviewed and are negative.  Otherwise per HPI.  Assessment & Plan: Visit Diagnoses:  1. Chronic midline low back pain without sciatica   2. Paresthesia of skin   3. Pain in right wrist     Plan: Findings:  In terms of her back pain I feel like most of this is  really arthritic type mechanical back pain which is fairly nonspecific.  On x-rays today she does have some early facet joint arthritis particularly at L4-5 but with no listhesis.  She has no pars defects.  She also clearly has some changes to the hips bilaterally with early periarticular osteophytes.  I think some of this is due to her weight.  We did talk about weight management and back and hip pain.  I think the best approach would be to increase her anti-inflammatory to meloxicam for short course as well as giving her a different muscle relaxer  which would be methocarbamol.  Talked about activity modification and exercises.  In terms of her hands she really likely needs electrodiagnostic study of the right hand.  I think with the anti-inflammatory and continued use of the splints we will see how she does.  She did not really give any conclusive testing today that would indicate tenosynovitis and there is no synovitis on exam she might benefit from seeing 1 of the orthopedic surgeons in our office that does a lot of hand work, either Dr. Ninfa Linden or Dr. Erlinda Hong.  We will also consider electrodiagnostic study of the hand.  That could be done in our office.  I will see her back in follow-up in a few weeks.  Next step would either be diagnostic injection versus potential for more advanced imaging.    Meds & Orders:  Meds ordered this encounter  Medications  . meloxicam (MOBIC) 15 MG tablet    Sig: Take 1 tablet (15 mg total) by mouth daily. Take with food    Dispense:  30 tablet    Refill:  0  . methocarbamol (ROBAXIN) 500 MG tablet    Sig: Take 1 tablet (500 mg total) by mouth every 8 (eight) hours as needed for muscle spasms.    Dispense:  60 tablet    Refill:  0    Orders Placed This Encounter  Procedures  . XR Lumbar Spine Complete    Follow-up: Return in about 4 weeks (around 09/22/2017).   Procedures: No procedures performed  No notes on file   Clinical History: No specialty comments available.  She reports that she has quit smoking. she has never used smokeless tobacco. No results for input(s): HGBA1C, LABURIC in the last 8760 hours.  Objective:  VS:  HT:    WT:   BMI:     BP:134/77  HR:68bpm  TEMP:98.2 F (36.8 C)(Oral)  RESP:100 % Physical Exam  Constitutional: She is oriented to person, place, and time. She appears well-developed and well-nourished. No distress.  Obese  HENT:  Head: Normocephalic and atraumatic.  Nose: Nose normal.  Mouth/Throat: Oropharynx is clear and moist.  Eyes: Conjunctivae are normal.  Pupils are equal, round, and reactive to light.  Neck: Normal range of motion. Neck supple. No JVD present. No tracheal deviation present.  Cardiovascular: Regular rhythm and intact distal pulses.  Pulmonary/Chest: Effort normal. No respiratory distress.  Abdominal: Soft. She exhibits no distension. There is no guarding.  Musculoskeletal:  Patient is very slow to rise from a seated position and does have concordant pain on rotation of the lumbar spine.  She has stiff somewhat hip movement but no real pain in the groin or external she is somewhat tender over the greater trochanters but it is hard to palpate a body habitus.  She has good distal strength and proximal strength examination of the right and left hand does not show any synovitis.  There are no  scars or nodules.  There are no flexor contractures or triggering.  She has a negative Finkelstein's bilaterally.  She has a negative Hoffmann's bilaterally.  She has a negative Tinel's test at the wrist bilaterally.  She has an equivocal Phalen's on the right.  Neurological: She is alert and oriented to person, place, and time. She exhibits normal muscle tone. Coordination normal.  Skin: Skin is warm. No rash noted. No erythema.  Psychiatric: She has a normal mood and affect. Her behavior is normal.  Nursing note and vitals reviewed.   Ortho Exam Imaging: No results found.  Past Medical/Family/Surgical/Social History: Medications & Allergies reviewed per EMR Patient Active Problem List   Diagnosis Date Noted  . Low back pain radiating to right lower extremity 07/06/2017  . Upper back pain, chronic 07/06/2017  . Hypertension, essential, benign 03/11/2017  . Bipolar disorder with depression ([place]) 03/11/2017  . Class 1 obesity with body mass index (BMI) of 34.0 to 34.9 in adult 03/11/2017   Past Medical History:  Diagnosis Date  . Anxiety   . Arthritis   . Depression   . Hypertension    Family History  Problem Relation Age of Onset  .  Arthritis Mother   . Asthma Mother   . Cancer Maternal Aunt        breast  . Depression Maternal Aunt   . Hypertension Maternal Aunt   . Diabetes Maternal Grandfather   . Heart disease Maternal Grandfather   . Hypertension Maternal Grandfather   . Stroke Maternal Grandfather    History reviewed. No pertinent surgical history. Social History   Occupational History  . Not on file  Tobacco Use  . Smoking status: Former Research scientist (life sciences)  . Smokeless tobacco: Never Used  Substance and Sexual Activity  . Alcohol use: Yes  . Drug use: No  . Sexual activity: Yes    Birth control/protection: None

## 2017-09-22 ENCOUNTER — Ambulatory Visit (INDEPENDENT_AMBULATORY_CARE_PROVIDER_SITE_OTHER): Payer: 59 | Admitting: Physical Medicine and Rehabilitation

## 2017-10-11 ENCOUNTER — Encounter: Payer: Self-pay | Admitting: Family Medicine

## 2017-10-11 ENCOUNTER — Ambulatory Visit (INDEPENDENT_AMBULATORY_CARE_PROVIDER_SITE_OTHER): Payer: PRIVATE HEALTH INSURANCE | Admitting: Family Medicine

## 2017-10-11 VITALS — BP 125/77 | HR 62 | Temp 97.6°F | Resp 12 | Ht 66.75 in | Wt 243.2 lb

## 2017-10-11 DIAGNOSIS — R1011 Right upper quadrant pain: Secondary | ICD-10-CM | POA: Diagnosis not present

## 2017-10-11 DIAGNOSIS — E6609 Other obesity due to excess calories: Secondary | ICD-10-CM

## 2017-10-11 DIAGNOSIS — R739 Hyperglycemia, unspecified: Secondary | ICD-10-CM | POA: Diagnosis not present

## 2017-10-11 DIAGNOSIS — Z6834 Body mass index (BMI) 34.0-34.9, adult: Secondary | ICD-10-CM | POA: Diagnosis not present

## 2017-10-11 DIAGNOSIS — R140 Abdominal distension (gaseous): Secondary | ICD-10-CM | POA: Diagnosis not present

## 2017-10-11 LAB — COMPREHENSIVE METABOLIC PANEL
ALT: 36 U/L — AB (ref 0–35)
AST: 21 U/L (ref 0–37)
Albumin: 3.6 g/dL (ref 3.5–5.2)
Alkaline Phosphatase: 59 U/L (ref 39–117)
BILIRUBIN TOTAL: 0.4 mg/dL (ref 0.2–1.2)
BUN: 14 mg/dL (ref 6–23)
CO2: 28 meq/L (ref 19–32)
Calcium: 8.9 mg/dL (ref 8.4–10.5)
Chloride: 104 mEq/L (ref 96–112)
Creatinine, Ser: 0.82 mg/dL (ref 0.40–1.20)
GFR: 82.57 mL/min (ref >60.00)
GLUCOSE: 104 mg/dL — AB (ref 70–99)
Potassium: 4.4 mEq/L (ref 3.5–5.1)
Sodium: 137 mEq/L (ref 135–145)
Total Protein: 6.3 g/dL (ref 6.0–8.3)

## 2017-10-11 LAB — CBC
HCT: 39.6 % (ref 36.0–46.0)
Hemoglobin: 13.3 g/dL (ref 12.0–15.0)
MCHC: 33.6 g/dL (ref 30.0–36.0)
MCV: 82 fl (ref 78.0–100.0)
PLATELETS: 308 10*3/uL (ref 150.0–400.0)
RBC: 4.83 Mil/uL (ref 3.87–5.11)
RDW: 16 % — ABNORMAL HIGH (ref 11.5–15.5)
WBC: 7.6 10*3/uL (ref 4.0–10.5)

## 2017-10-11 LAB — HEMOGLOBIN A1C: HEMOGLOBIN A1C: 6.5 % (ref 4.6–6.5)

## 2017-10-11 NOTE — Progress Notes (Signed)
ACUTE VISIT   HPI:  Chief Complaint  Patient presents with  . Bloated    started 3 weeks ago    Ms.Kassondra Krotz is a 37 y.o. female, who is here today complaining of 3 weeks of bloating sensation. Problem is aggravated by greasy food intake.  She states that when she avoids this food she feels better.  She also reports intermittent RUQ pain, "every one in a while". She has had for a while but recently aggravated after sneezing.  Pain is sharp, lasts seconds, is not related with food intake.   She denies associated fever, chills, heartburn, or changes in bowel habits Occasionally she has nausea.  Constipation, hard bowel movements, denies blood in the stool.  She states that sometimes she eats habanero chicken or milk, so she can have a bowel movement. She has bowel movements every 1-2 days.  Last bowel movement today morning.   Denies dysuria,increased urinary frequency, gross hematuria,or decreased urine output.  Hyperglycemia in 03/2017, 116.   She is not exercising regularly and she is not following a healthy diet, she has gained weight since her last visit.   Review of Systems  Constitutional: Negative for activity change, appetite change, fatigue and fever.  HENT: Negative for mouth sores, sore throat and trouble swallowing.   Respiratory: Negative for cough, shortness of breath and wheezing.   Gastrointestinal: Positive for abdominal distention, abdominal pain and constipation. Negative for blood in stool, diarrhea, nausea and vomiting.  Endocrine: Negative for cold intolerance, heat intolerance, polydipsia, polyphagia and polyuria.  Genitourinary: Negative for dysuria, frequency, hematuria, vaginal bleeding and vaginal discharge.  Musculoskeletal: Positive for back pain (chronic,stable). Negative for gait problem and myalgias.  Skin: Negative for rash.  Neurological: Negative for syncope, weakness and headaches.  Hematological: Negative for adenopathy.  Does not bruise/bleed easily.  Psychiatric/Behavioral: Negative for confusion. The patient is nervous/anxious.       Current Outpatient Medications on File Prior to Visit  Medication Sig Dispense Refill  . cyclobenzaprine (FLEXERIL) 5 MG tablet Take 1 tablet (5 mg total) by mouth 2 (two) times daily as needed for muscle spasms. 30 tablet 1  . meloxicam (MOBIC) 15 MG tablet TAKE 1 TABLET (15 MG TOTAL) BY MOUTH DAILY. TAKE WITH FOOD 30 tablet 0  . methocarbamol (ROBAXIN) 500 MG tablet TAKE 1 TABLET (500 MG TOTAL) BY MOUTH EVERY 8 (EIGHT) HOURS AS NEEDED FOR MUSCLE SPASMS. 60 tablet 0  . Methylcobalamin (B-12) 5000 MCG TBDP Take 1 tablet by mouth daily.    . multivitamin-iron-minerals-folic acid (CENTRUM) chewable tablet Chew 1 tablet by mouth daily.    . propranolol (INDERAL) 40 MG tablet TAKE 1 TABLET (40 MG) BY MOUTH 2 TIMES DAILY. 180 tablet 1  . QUEtiapine (SEROQUEL) 100 MG tablet Take 1 tablet (100 mg total) by mouth at bedtime. (Patient not taking: Reported on 07/06/2017) 30 tablet 1   No current facility-administered medications on file prior to visit.      Past Medical History:  Diagnosis Date  . Anxiety   . Arthritis   . Depression   . Hypertension    No Known Allergies  Social History   Socioeconomic History  . Marital status: Married    Spouse name: None  . Number of children: None  . Years of education: None  . Highest education level: None  Social Needs  . Financial resource strain: None  . Food insecurity - worry: None  . Food insecurity - inability: None  .  Transportation needs - medical: None  . Transportation needs - non-medical: None  Occupational History  . None  Tobacco Use  . Smoking status: Former Research scientist (life sciences)  . Smokeless tobacco: Never Used  Substance and Sexual Activity  . Alcohol use: Yes  . Drug use: No  . Sexual activity: Yes    Birth control/protection: None  Other Topics Concern  . None  Social History Narrative  . None    Vitals:    10/11/17 1009  BP: 125/77  Pulse: 62  Resp: 12  Temp: 97.6 F (36.4 C)  SpO2: 99%   Body mass index is 38.38 kg/m.   Wt Readings from Last 3 Encounters:  10/11/17 243 lb 4 oz (110.3 kg)  07/06/17 234 lb (106.1 kg)  03/11/17 218 lb 6 oz (99.1 kg)     Physical Exam  Nursing note and vitals reviewed. Constitutional: She is oriented to person, place, and time. She appears well-developed. She does not appear ill. No distress.  HENT:  Head: Normocephalic and atraumatic.  Mouth/Throat: Oropharynx is clear and moist and mucous membranes are normal.  Eyes: Conjunctivae are normal. No scleral icterus.  Cardiovascular: Normal rate and regular rhythm.  No murmur heard. Respiratory: Effort normal and breath sounds normal. No respiratory distress.  GI: Soft. Bowel sounds are normal. She exhibits no distension and no mass. There is no hepatomegaly. There is no tenderness.  Musculoskeletal: She exhibits no edema.  Lymphadenopathy:    She has no cervical adenopathy.       Right: No supraclavicular adenopathy present.       Left: No supraclavicular adenopathy present.  Neurological: She is alert and oriented to person, place, and time. She has normal strength. Gait normal.  Skin: Skin is warm. No rash noted. No erythema.  Psychiatric: Her mood appears anxious.  Well groomed, good eye contact.      ASSESSMENT AND PLAN:   Ms. Linlee was seen today for bloated.  Diagnoses and all orders for this visit:  RUQ abdominal pain  Possible causes discussed. ? Musculoskeletal and gallbladder disease some to consider. If pain persists we may consider RUQ [place].  -     CBC -     Comprehensive metabolic panel  Hyperglycemia  Primary prevention through a healthier life style encouraged. Further recommendations will be given according to lab results.  -     Hemoglobin A1c  Bloated abdomen  She has identified some trigger factors,so avoid food that aggravates symptoms. Instructed about  warning signs.  -     CBC -     Comprehensive metabolic panel  Class 1 obesity due to excess calories without serious comorbidity with body mass index (BMI) of 34.0 to 34.9 in adult  She has gained about 9-10 Lb since 06/2017. We discussed benefits of wt loss as well as adverse effects of obesity. Consistency with healthy diet and physical activity recommended.     Return in about 4 weeks (around 11/08/2017), or if symptoms worsen or fail to improve.     -Ms.Deajah Erkkila was advised to seek immediate medical attention if sudden worsening symptoms.      Betty G. Martinique, MD  [facility]. [place] office.

## 2017-10-11 NOTE — Patient Instructions (Addendum)
A few things to remember from today's visit:   RUQ abdominal pain - Plan: Hemoglobin A1c, CBC, Comprehensive metabolic panel  Hyperglycemia - Plan: Hemoglobin A1c  Bloated abdomen - Plan: CBC, Comprehensive metabolic panel  Avoid foods that trigger symptoms.  Benefiber may help with constipation. Miralax at bedtime.   Abdominal Bloating When you have abdominal bloating, your abdomen may feel full, tight, or painful. It may also look bigger than normal or swollen (distended). Common causes of abdominal bloating include:  Swallowing air.  Constipation.  Problems digesting food.  Eating too much.  Irritable bowel syndrome. This is a condition that affects the large intestine.  Lactose intolerance. This is an inability to digest lactose, a natural sugar in dairy products.  Celiac disease. This is a condition that affects the ability to digest gluten, a protein found in some grains.  Gastroparesis. This is a condition that slows down the movement of food in the stomach and small intestine. It is more common in people with diabetes mellitus.  Gastroesophageal reflux disease (GERD). This is a digestive condition that makes stomach acid flow back into the esophagus.  Urinary retention. This means that the body is holding onto urine, and the bladder cannot be emptied all the way.  Follow these instructions at home: Eating and drinking  Avoid eating too much.  Try not to swallow air while talking or eating.  Avoid eating while lying down.  Avoid these foods and drinks: ? Foods that cause gas, such as broccoli, cabbage, cauliflower, and baked beans. ? Carbonated drinks. ? Hard candy. ? Chewing gum. Medicines  Take over-the-counter and prescription medicines only as told by your health care provider.  Take probiotic medicines. These medicines contain live bacteria or yeasts that can help digestion.  Take coated peppermint oil capsules. Activity  Try to exercise  regularly. Exercise may help to relieve bloating that is caused by gas and relieve constipation. General instructions  Keep all follow-up visits as told by your health care provider. This is important. Contact a health care provider if:  You have nausea and vomiting.  You have diarrhea.  You have abdominal pain.  You have unusual weight loss or weight gain.  You have severe pain, and medicines do not help. Get help right away if:  You have severe chest pain.  You have trouble breathing.  You have shortness of breath.  You have trouble urinating.  You have darker urine than normal.  You have blood in your stools or have dark, tarry stools. Summary  Abdominal bloating means that the abdomen is swollen.  Common causes of abdominal bloating are swallowing air, constipation, and problems digesting food.  Avoid eating too much and avoid swallowing air.  Avoid foods that cause gas, carbonated drinks, hard candy, and chewing gum. This information is not intended to replace advice given to you by your health care provider. Make sure you discuss any questions you have with your health care provider. Document Released: 08/27/2016 Document Revised: 08/27/2016 Document Reviewed: 08/27/2016 Elsevier Interactive Patient Education  [employer].   Please be sure medication list is accurate. If a new problem present, please set up appointment sooner than planned today.

## 2017-10-12 ENCOUNTER — Telehealth: Payer: Self-pay | Admitting: Family Medicine

## 2017-10-12 NOTE — Telephone Encounter (Signed)
Copied from [place]. Topic: Quick Communication - Lab Results >> Oct 12, 2017  9:01 AM Corie Chiquito, NT wrote: Patient calling to check the status of her labs. If someone could give her a call back when the results are back in. She can be reached at [phone]. She also wanted to let Dr.Jordan know that when she eats something the pain in her stomach goes away, but when it is empty she has pain in her stomach.

## 2017-10-12 NOTE — Telephone Encounter (Signed)
Spoke with patient, lab results and instructions given. Patient verbalized understanding.

## 2017-10-12 NOTE — Telephone Encounter (Signed)
Copied from [place] [id number]. Topic: Inquiry >> Oct 12, 2017 12:18 PM Arletha Grippe wrote: Reason for CRM: pt has question about sugar level [phone] Please callback

## 2017-10-12 NOTE — Telephone Encounter (Signed)
Spoke with patient, patient wanted to know what the normal range for blood sugar levels.

## 2017-10-20 ENCOUNTER — Other Ambulatory Visit (INDEPENDENT_AMBULATORY_CARE_PROVIDER_SITE_OTHER): Payer: Self-pay | Admitting: Physical Medicine and Rehabilitation

## 2017-10-20 NOTE — Telephone Encounter (Signed)
Please advise. Patient was a no show for her last OV.

## 2018-01-02 ENCOUNTER — Other Ambulatory Visit: Payer: Self-pay | Admitting: Family Medicine

## 2018-01-02 DIAGNOSIS — I10 Essential (primary) hypertension: Secondary | ICD-10-CM

## 2018-02-01 ENCOUNTER — Telehealth: Payer: Self-pay | Admitting: Family Medicine

## 2018-02-01 NOTE — Telephone Encounter (Signed)
Message sent to Dr. Jordan for review and approval. 

## 2018-02-01 NOTE — Telephone Encounter (Signed)
Copied from [place]. Topic: Quick Communication - See Telephone Encounter >> Feb 01, 2018  9:42 AM Mylinda Latina, NT wrote: CRM for notification. See Telephone encounter for: 02/01/18. Patient called and states she needs a refill of propranolol (INDERAL) 40 MG tablet  [id number] [address], [place] (Phone) [phone] (Fax)

## 2018-02-06 ENCOUNTER — Encounter: Payer: Self-pay | Admitting: Family Medicine

## 2018-02-06 ENCOUNTER — Telehealth: Payer: Self-pay | Admitting: Family Medicine

## 2018-02-06 ENCOUNTER — Ambulatory Visit (INDEPENDENT_AMBULATORY_CARE_PROVIDER_SITE_OTHER): Payer: 59 | Admitting: Family Medicine

## 2018-02-06 VITALS — BP 126/78 | HR 70 | Temp 97.8°F | Resp 12 | Ht 66.75 in | Wt 230.2 lb

## 2018-02-06 DIAGNOSIS — R7303 Prediabetes: Secondary | ICD-10-CM

## 2018-02-06 DIAGNOSIS — I10 Essential (primary) hypertension: Secondary | ICD-10-CM | POA: Diagnosis not present

## 2018-02-06 DIAGNOSIS — E6609 Other obesity due to excess calories: Secondary | ICD-10-CM

## 2018-02-06 DIAGNOSIS — F419 Anxiety disorder, unspecified: Secondary | ICD-10-CM | POA: Diagnosis not present

## 2018-02-06 DIAGNOSIS — Z6834 Body mass index (BMI) 34.0-34.9, adult: Secondary | ICD-10-CM

## 2018-02-06 LAB — POCT GLYCOSYLATED HEMOGLOBIN (HGB A1C): Hemoglobin A1C: 6.2 % — AB (ref 4.0–5.6)

## 2018-02-06 MED ORDER — PROPRANOLOL HCL 40 MG PO TABS: ORAL_TABLET | ORAL | 3 refills | Status: DC

## 2018-02-06 NOTE — Telephone Encounter (Unsigned)
Copied from [place] [phone]. Topic: Quick Communication - Rx Refill/Question >> Feb 06, 2018  9:08 AM Judyann Munson wrote: Medication:  propranolol (INDERAL) 40 MG tablet  Has the patient contacted their pharmacy? No   Preferred Pharmacy (with phone number or street name): [id number] [address], [place] [phone] (Phone) [phone] (Fax)  Patient called on Thursday 6-26 in regards to refill.

## 2018-02-06 NOTE — Telephone Encounter (Signed)
Pt states that if something happens to her due to her not feeling well for being off of medicine its going to be on Dr Martinique and if provider don't refill this medicine she will go see another provider she would like Martinique to give her a call back at [phone]

## 2018-02-06 NOTE — Telephone Encounter (Signed)
Patient came into the office for medication refill. Patient advised that Dr. Martinique is off on Thursdays, the day she stated she requested the refill and that the the phones were down some days last week and that we could not call out, as well as that the medication has to be approved by Dr. Martinique before Rx can be sent to pharmacy and that she must keep follow-up appointments for refills. Patient verbalized understanding.

## 2018-02-06 NOTE — Telephone Encounter (Signed)
Pt calling stating that she is out of medicine she only had one pill to take on Saturday and Sunday  She was following up on the med refill pt states that she feel shaky and need her medicine

## 2018-02-06 NOTE — Assessment & Plan Note (Signed)
Adequately controlled. No changes in current management. Low-salt diet to continue. Eye exam recommended annually. Since she is tolerating medication well and as far as she can monitor BP and HR at home, I think is appropriate for her to follow annually.

## 2018-02-06 NOTE — Telephone Encounter (Signed)
Patient walked in and made a same day to see Dr. Martinique for medication refill. Patient stated that she called Thursday for refill, then called back Friday and was told it would be a 48 hour turn around. Patient stated that she hadn't heard anything so she came up to the office and was told that she needed an appointment in order to have medication refilled. Patient informed that the phones in the office were down a few days last week and we could not call out, secondly, Dr. Martinique and myself are out on Thursdays so we did not get the refill request until Friday and that it was pending approval by Dr. Martinique. Patient stated that she understood and stated she wonder why she wasn't just told that from the beginning.

## 2018-02-06 NOTE — Progress Notes (Signed)
[name] is a 37 y.o.female, who is here today to follow on HTN.  Currently she is on propranolol 40 mg twice daily.   According to pt, she was also placed on the medication because intermittent tremors due to anxiety.  She is taking medications as instructed, no side effects reported.  She has not noted unusual headache, visual changes, exertional chest pain, dyspnea,  focal weakness, or edema.  Last eye exam 03/2017.  Lab Results  Component Value Date   CREATININE 0.82 10/11/2017   BUN 14 10/11/2017   NA 137 10/11/2017   K 4.4 10/11/2017   CL 104 10/11/2017   CO2 28 10/11/2017   Hg A1c 6.5 in 10/2017. No history of diabetes. Denies abdominal pain, nausea,vomiting, polydipsia,polyuria, or polyphagia.  She states that she is not interested in taking medications.  She has been exercising regularly , she also changed dietary habits. She is no longer drinking sodas and decreased sugar intake. She has noted some weight loss, her clothes fit better.   Review of Systems  Constitutional: Negative for activity change, appetite change, fatigue and fever.  HENT: Negative for mouth sores, nosebleeds and trouble swallowing.   Eyes: Negative for redness and visual disturbance.  Respiratory: Negative for shortness of breath and wheezing.   Cardiovascular: Negative for chest pain, palpitations and leg swelling.  Gastrointestinal: Negative for abdominal pain, nausea and vomiting.       Negative for changes in bowel habits.  Endocrine: Negative for polydipsia, polyphagia and polyuria.  Genitourinary: Negative for decreased urine volume and hematuria.  Skin: Negative for rash and wound.  Neurological: Positive for tremors. Negative for syncope, weakness and headaches.  Psychiatric/Behavioral: Negative for confusion. The patient is nervous/anxious.      Current Outpatient Medications on File Prior to Visit  Medication Sig Dispense Refill  . cyclobenzaprine (FLEXERIL) 5  MG tablet Take 1 tablet (5 mg total) by mouth 2 (two) times daily as needed for muscle spasms. 30 tablet 1  . meloxicam (MOBIC) 15 MG tablet TAKE 1 TABLET (15 MG TOTAL) BY MOUTH DAILY. TAKE WITH FOOD 30 tablet 0  . methocarbamol (ROBAXIN) 500 MG tablet TAKE 1 TABLET (500 MG TOTAL) BY MOUTH EVERY 8 (EIGHT) HOURS AS NEEDED FOR MUSCLE SPASMS. 60 tablet 0  . Methylcobalamin (B-12) 5000 MCG TBDP Take 1 tablet by mouth daily.    . multivitamin-iron-minerals-folic acid (CENTRUM) chewable tablet Chew 1 tablet by mouth daily.    . QUEtiapine (SEROQUEL) 100 MG tablet Take 1 tablet (100 mg total) by mouth at bedtime. (Patient not taking: Reported on 07/06/2017) 30 tablet 1   No current facility-administered medications on file prior to visit.      Past Medical History:  Diagnosis Date  . Anxiety   . Arthritis   . Depression   . Hypertension     No Known Allergies  Social History   Socioeconomic History  . Marital status: Married    Spouse name: Not on file  . Number of children: Not on file  . Years of education: Not on file  . Highest education level: Not on file  Occupational History  . Not on file  Social Needs  . Financial resource strain: Not on file  . Food insecurity:    Worry: Not on file    Inability: Not on file  . Transportation needs:    Medical: Not on file    Non-medical: Not on file  Tobacco Use  . Smoking status: Former Research scientist (life sciences)  .  Smokeless tobacco: Never Used  Substance and Sexual Activity  . Alcohol use: Yes  . Drug use: No  . Sexual activity: Yes    Birth control/protection: None  Lifestyle  . Physical activity:    Days per week: Not on file    Minutes per session: Not on file  . Stress: Not on file  Relationships  . Social connections:    Talks on phone: Not on file    Gets together: Not on file    Attends religious service: Not on file    Active member of club or organization: Not on file    Attends meetings of clubs or organizations: Not on file     Relationship status: Not on file  Other Topics Concern  . Not on file  Social History Narrative  . Not on file    Vitals:   02/06/18 1058  BP: 126/78  Pulse: 70  Resp: 12  Temp: 97.8 F (36.6 C)  SpO2: 98%   Body mass index is 36.33 kg/m.  . Wt Readings from Last 3 Encounters:  02/06/18 230 lb 4 oz (104.4 kg)  10/11/17 243 lb 4 oz (110.3 kg)  07/06/17 234 lb (106.1 kg)     Physical Exam  Nursing note and vitals reviewed. Constitutional: She is oriented to person, place, and time. She appears well-developed. No distress.  HENT:  Head: Normocephalic and atraumatic.  Mouth/Throat: Oropharynx is clear and moist and mucous membranes are normal.  Eyes: Pupils are equal, round, and reactive to light. Conjunctivae are normal.  Cardiovascular: Normal rate and regular rhythm.  No murmur heard. Pulses:      Dorsalis pedis pulses are 2+ on the right side, and 2+ on the left side.  Respiratory: Effort normal and breath sounds normal. No respiratory distress.  GI: Soft. She exhibits no mass. There is no hepatomegaly. There is no tenderness.  Musculoskeletal: She exhibits no edema.  Lymphadenopathy:    She has no cervical adenopathy.  Neurological: She is alert and oriented to person, place, and time. She has normal strength. Gait normal.  Skin: Skin is warm. No erythema.  Psychiatric: She has a normal mood and affect.  Well groomed, good eye contact.    ASSESSMENT AND PLAN:   Cyerra was seen today for medication management.  Orders Placed This Encounter  Procedures  . POCT glycosylated hemoglobin (Hb A1C)    Lab Results  Component Value Date   HGBA1C 6.2 (A) 02/06/2018    Prediabetes  HgA1C improved. Encouraged to continue healthy life style for primary prevention. F/U in a year.  -     POCT glycosylated hemoglobin (Hb A1C)   Class 1 obesity with body mass index (BMI) of 34.0 to 34.9 in adult Encouraged to continue healthy diet and regular physical  activity. She has lost about 13 pounds since her last visit in 10/2017. We discussed some benefits of weight loss as well as adverse effects.   Hypertension, essential, benign Adequately controlled. No changes in current management. Low-salt diet to continue. Eye exam recommended annually. Since she is tolerating medication well and as far as she can monitor BP and HR at home, I think is appropriate for her to follow annually.   Anxiety disorder Reporting tremor due to anxiety being well controlled since she has been on propranolol 40 mg twice daily. No changes in current management. Follow-up in 12 months, before if needed.      Jhada Risk G. Martinique, MD  [facility]. [place] office.

## 2018-02-06 NOTE — Assessment & Plan Note (Signed)
Encouraged to continue healthy diet and regular physical activity. She has lost about 13 pounds since her last visit in 10/2017. We discussed some benefits of weight loss as well as adverse effects.

## 2018-02-06 NOTE — Patient Instructions (Addendum)
A few things to remember from today's visit:   Prediabetes - Plan: POCT glycosylated hemoglobin (Hb A1C)  Class 1 obesity due to excess calories with serious comorbidity and body mass index (BMI) of 34.0 to 34.9 in adult  Hypertension, essential, benign - Plan: propranolol (INDERAL) 40 MG tablet  Lab Results  Component Value Date   HGBA1C 6.2 (A) 02/06/2018   Please be sure medication list is accurate. If a new problem present, please set up appointment sooner than planned today.

## 2018-02-06 NOTE — Assessment & Plan Note (Signed)
Reporting tremor due to anxiety being well controlled since she has been on propranolol 40 mg twice daily. No changes in current management. Follow-up in 12 months, before if needed.

## 2018-07-10 ENCOUNTER — Encounter (HOSPITAL_COMMUNITY): Payer: Self-pay

## 2018-07-10 ENCOUNTER — Emergency Department (HOSPITAL_COMMUNITY)
Admission: EM | Admit: 2018-07-10 | Discharge: 2018-07-10 | Disposition: A | Discharge status: Home / Self Care | Payer: 59 | Attending: Emergency Medicine | Admitting: Emergency Medicine

## 2018-07-10 ENCOUNTER — Other Ambulatory Visit: Payer: Self-pay

## 2018-07-10 DIAGNOSIS — Z87891 Personal history of nicotine dependence: Secondary | ICD-10-CM | POA: Diagnosis not present

## 2018-07-10 DIAGNOSIS — I10 Essential (primary) hypertension: Secondary | ICD-10-CM | POA: Insufficient documentation

## 2018-07-10 DIAGNOSIS — Z79899 Other long term (current) drug therapy: Secondary | ICD-10-CM | POA: Diagnosis not present

## 2018-07-10 DIAGNOSIS — R42 Dizziness and giddiness: Secondary | ICD-10-CM | POA: Diagnosis present

## 2018-07-10 LAB — BASIC METABOLIC PANEL
Anion gap: 10 (ref 5–15)
BUN: 19 mg/dL (ref 6–20)
CO2: 24 mmol/L (ref 22–32)
Calcium: 9.4 mg/dL (ref 8.9–10.3)
Chloride: 104 mmol/L (ref 98–111)
Creatinine, Ser: 0.82 mg/dL (ref 0.44–1.00)
GFR calc Af Amer: >60 mL/min (ref >60)
GFR calc non Af Amer: >60 mL/min (ref >60)
Glucose, Bld: 97 mg/dL (ref 70–99)
Potassium: 4 mmol/L (ref 3.5–5.1)
Sodium: 138 mmol/L (ref 135–145)

## 2018-07-10 LAB — URINALYSIS, ROUTINE W REFLEX MICROSCOPIC
Bacteria, UA: NONE SEEN
Bilirubin Urine: NEGATIVE
Glucose, UA: NEGATIVE mg/dL
KETONES UR: NEGATIVE mg/dL
Leukocytes, UA: NEGATIVE
Nitrite: NEGATIVE
Protein, ur: NEGATIVE mg/dL
Specific Gravity, Urine: 1.014 (ref 1.005–1.030)
pH: 6 (ref 5.0–8.0)

## 2018-07-10 LAB — I-STAT BETA HCG BLOOD, ED (MC, WL, AP ONLY): I-stat hCG, quantitative: <5 m[IU]/mL (ref <5)

## 2018-07-10 MED ORDER — SODIUM CHLORIDE 0.9 % IV BOLUS
1000.0000 mL | Freq: Once | INTRAVENOUS | Status: AC
  Administered 2018-07-10: 1000 mL via INTRAVENOUS

## 2018-07-10 NOTE — ED Provider Notes (Signed)
[facility] DEPT Provider Note: Georgena Spurling, MD, FACEP  CSN: [id number] MRN: [id number] ARRIVAL: 07/10/18 at [place]: [place]   HISTORY OF PRESENT ILLNESS  07/10/18 3:13 AM Sue Jackson is a 37 y.o. female who has had several days of lightheadedness.  She has not actually passed out.  The lightheadedness is sometimes, but not always, accompanied by palpitations by which she means her heart rate feels irregular.  She was seen at an urgent care yesterday where an EKG was done and was normal.  CBC was also normal.  She denies chest pain, shortness of breath, nausea, vomiting or diarrhea.  She was started on chlorthalidone yesterday for elevated blood pressure.  She is already on propanolol for tremor.    Past Medical History:  Diagnosis Date  . Anxiety   . Arthritis   . Depression   . Hypertension     History reviewed. No pertinent surgical history.  Family History  Problem Relation Age of Onset  . Arthritis Mother   . Asthma Mother   . Cancer Maternal Aunt        breast  . Depression Maternal Aunt   . Hypertension Maternal Aunt   . Diabetes Maternal Grandfather   . Heart disease Maternal Grandfather   . Hypertension Maternal Grandfather   . Stroke Maternal Grandfather     Social History   Tobacco Use  . Smoking status: Former Research scientist (life sciences)  . Smokeless tobacco: Never Used  Substance Use Topics  . Alcohol use: Yes  . Drug use: No    Prior to Admission medications   Medication Sig Start Date End Date Taking? Authorizing Provider  chlorthalidone (HYGROTON) 25 MG tablet Take 25 mg by mouth daily.   Yes [provider]  cyclobenzaprine (FLEXERIL) 5 MG tablet Take 1 tablet (5 mg total) by mouth 2 (two) times daily as needed for muscle spasms. 07/06/17  Yes Martinique, Betty G, MD  meloxicam (MOBIC) 15 MG tablet TAKE 1 TABLET (15 MG TOTAL) BY MOUTH DAILY. TAKE WITH FOOD Patient taking differently: Take 15 mg by mouth daily as  needed. Pain 09/12/17  Yes Magnus Sinning, MD  methocarbamol (ROBAXIN) 500 MG tablet TAKE 1 TABLET (500 MG TOTAL) BY MOUTH EVERY 8 (EIGHT) HOURS AS NEEDED FOR MUSCLE SPASMS. 09/12/17  Yes Magnus Sinning, MD  propranolol (INDERAL) 40 MG tablet TAKE 1 TABLET (40 MG) BY MOUTH 2 TIMES DAILY. Patient taking differently: Take 40 mg by mouth 2 (two) times daily.  02/06/18  Yes Martinique, Betty G, MD  QUEtiapine (SEROQUEL) 100 MG tablet Take 1 tablet (100 mg total) by mouth at bedtime. Patient not taking: Reported on 07/06/2017 03/11/17   Martinique, Betty G, MD    Allergies Patient has no known allergies.   REVIEW OF SYSTEMS  Negative except as noted here or in the History of Present Illness.   PHYSICAL EXAMINATION  Initial Vital Signs Blood pressure 133/80, pulse 64, temperature 98.3 F (36.8 C), temperature source Oral, resp. rate 15, height 5\' 6"  (1.676 m), weight 103 kg, SpO2 98 %.  Examination General: Well-developed, well-nourished female in no acute distress; appearance consistent with age of record HENT: normocephalic; atraumatic Eyes: pupils equal, round and reactive to light; extraocular muscles intact Neck: supple Heart: regular rate and rhythm Lungs: clear to auscultation bilaterally Abdomen: soft; nondistended; nontender; bowel sounds present Extremities: No deformity; full range of motion; pulses normal Neurologic: Awake, alert and oriented; motor function intact in all extremities and symmetric;  no facial droop Skin: Warm and dry Psychiatric: Normal mood and affect   RESULTS  Summary of this visit's results, reviewed by myself:   Date: 07/09/2018 10:06 AM  Rate: 61  Rhythm: normal sinus rhythm  QRS Axis: normal  Intervals: normal  ST/T Wave abnormalities: normal  Conduction Disutrbances: none  Narrative Interpretation: unremarkable  Comparison with previous EKG: None available  Laboratory Studies: Results for orders placed or performed during the hospital encounter of  07/10/18 (from the past 24 hour(s))  Basic metabolic panel     Status: None   Collection Time: 07/10/18  1:38 AM  Result Value Ref Range   Sodium 138 135 - 145 mmol/L   Potassium 4.0 3.5 - 5.1 mmol/L   Chloride 104 98 - 111 mmol/L   CO2 24 22 - 32 mmol/L   Glucose, Bld 97 70 - 99 mg/dL   BUN 19 6 - 20 mg/dL   Creatinine, Ser 0.82 0.44 - 1.00 mg/dL   Calcium 9.4 8.9 - 10.3 mg/dL   GFR calc non Af Amer >60 >60 mL/min   GFR calc Af Amer >60 >60 mL/min   Anion gap 10 5 - 15  I-Stat beta hCG blood, ED     Status: None   Collection Time: 07/10/18  1:44 AM  Result Value Ref Range   I-stat hCG, quantitative <5.0 <5 mIU/mL   Comment 3          Urinalysis, Routine w reflex microscopic     Status: Abnormal   Collection Time: 07/10/18  3:27 AM  Result Value Ref Range   Color, Urine STRAW (A) YELLOW   APPearance CLEAR CLEAR   Specific Gravity, Urine 1.014 1.005 - 1.030   pH 6.0 5.0 - 8.0   Glucose, UA NEGATIVE NEGATIVE mg/dL   Hgb urine dipstick MODERATE (A) NEGATIVE   Bilirubin Urine NEGATIVE NEGATIVE   Ketones, ur NEGATIVE NEGATIVE mg/dL   Protein, ur NEGATIVE NEGATIVE mg/dL   Nitrite NEGATIVE NEGATIVE   Leukocytes, UA NEGATIVE NEGATIVE   RBC / HPF 6-10 0 - 5 RBC/hpf   WBC, UA 0-5 0 - 5 WBC/hpf   Bacteria, UA NONE SEEN NONE SEEN   Squamous Epithelial / LPF 0-5 0 - 5   Mucus PRESENT    Imaging Studies: No results found.  ED COURSE and MDM  Nursing notes and initial vitals signs, including pulse oximetry, reviewed.  Vitals:   07/10/18 0428 07/10/18 0429 07/10/18 0430 07/10/18 0432  BP: 135/71  (!) 148/89 (!) 148/96  Pulse: (!) 55 (!) 56 (!) 56 63  Resp: 15  20 19   Temp:      TempSrc:      SpO2: 100%  100% 100%  Weight:      Height:       4:49 AM Patient advised of reassuring laboratory studies.  She has had no arrhythmias in the ED.  She takes propranolol for tremor.  She has been bradycardic at times in the ED.  She was advised to be careful with propranolol as this may  be causing symptomatic bradycardia as the cause of her lightheadedness.  PROCEDURES    ED DIAGNOSES     ICD-10-CM   1. Intermittent lightheadedness R42        Silas Muff, Jenny Reichmann, MD 07/10/18 [phone]

## 2018-07-10 NOTE — ED Triage Notes (Signed)
Pt reports that she has been feeling dizzy and lightheaded x 2-3 weeks. She was seen at a walk-in clinic this morning. She had an EKG and CBC done that were normal and she is requesting that they not be repeated. She endorses some nausea. Denies any pain or other symptoms. A&Ox4.

## 2018-07-10 NOTE — ED Notes (Signed)
Pt stated to this writer that she did not want labs repeated that she had done this morning at the Urgent Care Writer informed pt that she would need to speak with EDP about what labs she did or did not want Writer also explained that per protocol based on her c/c,  I needed to obtain an EKG Pt refused, stating, "I had one done at the clinic I went to this morning." This Probation officer complied with pt's wishes RN Maylon Cos notified

## 2018-07-11 ENCOUNTER — Ambulatory Visit: Payer: 59 | Admitting: Family Medicine

## 2018-07-11 ENCOUNTER — Encounter: Payer: Self-pay | Admitting: Family Medicine

## 2018-07-11 ENCOUNTER — Other Ambulatory Visit: Payer: Self-pay | Admitting: *Deleted

## 2018-07-11 ENCOUNTER — Telehealth: Payer: Self-pay

## 2018-07-11 VITALS — BP 140/84 | HR 84 | Temp 97.9°F | Resp 12 | Ht 66.0 in | Wt 223.4 lb

## 2018-07-11 DIAGNOSIS — F419 Anxiety disorder, unspecified: Secondary | ICD-10-CM | POA: Diagnosis not present

## 2018-07-11 DIAGNOSIS — R002 Palpitations: Secondary | ICD-10-CM

## 2018-07-11 DIAGNOSIS — I10 Essential (primary) hypertension: Secondary | ICD-10-CM

## 2018-07-11 DIAGNOSIS — R001 Bradycardia, unspecified: Secondary | ICD-10-CM | POA: Diagnosis not present

## 2018-07-11 DIAGNOSIS — H814 Vertigo of central origin: Secondary | ICD-10-CM

## 2018-07-11 DIAGNOSIS — R42 Dizziness and giddiness: Secondary | ICD-10-CM

## 2018-07-11 MED ORDER — CHLORTHALIDONE 25 MG PO TABS: 25.0000 mg | ORAL_TABLET | Freq: Every day | ORAL | 1 refills | Status: DC

## 2018-07-11 MED ORDER — SERTRALINE HCL 50 MG PO TABS: 50.0000 mg | ORAL_TABLET | Freq: Every day | ORAL | 1 refills | Status: DC

## 2018-07-11 MED ORDER — CITALOPRAM HYDROBROMIDE 10 MG PO TABS: 10.0000 mg | ORAL_TABLET | Freq: Every day | ORAL | 1 refills | Status: DC

## 2018-07-11 NOTE — Telephone Encounter (Signed)
Spoke with patient and she stated that she could try Celexa, but she does not want to take Effexor of Zoloft and patient wants to make sure medication will not be an issue with her working at night.

## 2018-07-11 NOTE — Telephone Encounter (Signed)
Copied from [place] [id number]. Topic: General - Other >> Jul 11, 2018  9:59 AM Yvette Rack wrote: Reason for CRM: pt calling stating that she is at the pharmacy and she said that she cant take zoloft it makes her feel crazy she cant sit down please call her when a new RX has been sent  she also states that the pharmacy didn't receive her BP medicine

## 2018-07-11 NOTE — Telephone Encounter (Signed)
Prescription for Celexa 10 mg was sent to her pharmacy. Thanks, BJ

## 2018-07-11 NOTE — Progress Notes (Signed)
ACUTE VISIT   HPI:  Chief Complaint  Patient presents with  . Discuss medications    having some dizziness and heart palpitaions, off and on for 1 week, just started new medication    Sue Jackson is a 37 y.o. female, who is here today complaining of "feeling really dizzy" and having intermittent episodes of palpitations. Symptoms started about a week ago. States the dizziness is not like a spinning, she feels like she may fall,"swimming head." She has history of vertigo but this recent episode of dizziness are now like her usual vertigo.  Dizziness last a "long time", it seems to be exacerbated by "picking up my speed."  Problem is associated with "little" nausea, she denies vomiting, visual changes, or focal deficit.   She states that she has had similar symptoms in the past intermittently but milder. She has taking Dramamine, which helped some.  She states that because of work she has been skipping meals. She is sleeping well.  She has had "some" palpitations intermittently for a week.  She has had similar episodes for a while. She feels like problem is "kind of getting worse." She has not identified exacerbating or alleviating factors. Not associated with exertion, they happen mainly at night when sitting or lying down, occasionally she has some episodes at work. Denies chest pain but states that she has had some "uncomfortable feeling but not for long" and it is not always associated with palpitation.  EKG and labs were done in the ED on 07/09/2018, reported as normal, at this time I do not have lab results or EKG.   She thinks symptoms are caused by anxiety, she just started working and feels like her job is very active. History of depression and bipolar disorder. She no longer following with psychiatrist, last follow-up appointment in 12/2016. She has tried Effexor, lithium, Prozac, Zoloft, Lexapro, Abilify, and Celexa almost home. Medications caused some  GI symptoms and Zoloft exacerbated maniac symptoms.  She denies depressed mood or suicidal thoughts.   She has been in the ED recently, 07/10/2018 (due to dizziness/lightheadedness) and 07/09/2018. On 07/09/2017 she was started on chlorthalidone 25 mg because elevated BP. She is currently on propranolol 40 mg twice daily, which was started initially for anxiety episode ("shaking" episodes) but has also helped to control hypertension. HR in the low 50s, is still she takes the propanolol if needed.  She was bradycardic during her last ED visit. She has had hypertension for about 5 years.  States that she sweats" a lot" when she is at work, which is a very active job.   Review of Systems  Constitutional: Negative for activity change, appetite change, fatigue and fever.  HENT: Negative for mouth sores and nosebleeds.   Eyes: Negative for redness and visual disturbance.  Respiratory: Negative for cough, shortness of breath and wheezing.   Cardiovascular: Positive for palpitations. Negative for chest pain and leg swelling.  Gastrointestinal: Negative for abdominal pain, nausea and vomiting.       Negative for changes in bowel habits.  Endocrine: Negative for cold intolerance and heat intolerance.  Genitourinary: Negative for decreased urine volume and hematuria.  Musculoskeletal: Negative for gait problem and myalgias.  Skin: Negative for pallor and rash.  Neurological: Positive for dizziness. Negative for syncope, weakness, numbness and headaches.  Psychiatric/Behavioral: Negative for confusion and sleep disturbance. The patient is nervous/anxious.       Current Outpatient Medications on File Prior to Visit  Medication Sig Dispense  Refill  . meloxicam (MOBIC) 15 MG tablet TAKE 1 TABLET (15 MG TOTAL) BY MOUTH DAILY. TAKE WITH FOOD (Patient taking differently: Take 15 mg by mouth daily as needed. Pain) 30 tablet 0  . methocarbamol (ROBAXIN) 500 MG tablet TAKE 1 TABLET (500 MG TOTAL) BY MOUTH  EVERY 8 (EIGHT) HOURS AS NEEDED FOR MUSCLE SPASMS. 60 tablet 0  . propranolol (INDERAL) 40 MG tablet TAKE 1 TABLET (40 MG) BY MOUTH 2 TIMES DAILY. (Patient taking differently: Take 40 mg by mouth 2 (two) times daily. ) 180 tablet 3   No current facility-administered medications on file prior to visit.      Past Medical History:  Diagnosis Date  . Anxiety   . Arthritis   . Depression   . Hypertension    No Known Allergies  Social History   Socioeconomic History  . Marital status: Married    Spouse name: Not on file  . Number of children: Not on file  . Years of education: Not on file  . Highest education level: Not on file  Occupational History  . Not on file  Social Needs  . Financial resource strain: Not on file  . Food insecurity:    Worry: Not on file    Inability: Not on file  . Transportation needs:    Medical: Not on file    Non-medical: Not on file  Tobacco Use  . Smoking status: Former Research scientist (life sciences)  . Smokeless tobacco: Never Used  Substance and Sexual Activity  . Alcohol use: Yes  . Drug use: No  . Sexual activity: Yes    Birth control/protection: None  Lifestyle  . Physical activity:    Days per week: Not on file    Minutes per session: Not on file  . Stress: Not on file  Relationships  . Social connections:    Talks on phone: Not on file    Gets together: Not on file    Attends religious service: Not on file    Active member of club or organization: Not on file    Attends meetings of clubs or organizations: Not on file    Relationship status: Not on file  Other Topics Concern  . Not on file  Social History Narrative  . Not on file    Vitals:   07/11/18 0840  BP: 140/84  Pulse: 84  Resp: 12  Temp: 97.9 F (36.6 C)  SpO2: 99%   Body mass index is 36.05 kg/m.   Physical Exam  Nursing note and vitals reviewed. Constitutional: She is oriented to person, place, and time. She appears well-developed. No distress.  HENT:  Head: Normocephalic  and atraumatic.  Mouth/Throat: Oropharynx is clear and moist and mucous membranes are normal.  Eyes: Pupils are equal, round, and reactive to light. Conjunctivae are normal.  Cardiovascular: Normal rate and regular rhythm.  No murmur heard. Pulses:      Dorsalis pedis pulses are 2+ on the right side, and 2+ on the left side.  Respiratory: Effort normal and breath sounds normal. No respiratory distress.  GI: Soft. She exhibits no mass. There is no hepatomegaly. There is no tenderness.  Musculoskeletal: She exhibits no edema.  Lymphadenopathy:    She has no cervical adenopathy.  Neurological: She is alert and oriented to person, place, and time. She has normal strength. No cranial nerve deficit. Gait normal.  Reflex Scores:      Bicep reflexes are 2+ on the right side and 2+ on the left side.  Patellar reflexes are 2+ on the right side and 2+ on the left side. Skin: Skin is warm. No rash noted. No erythema.  Psychiatric: Her mood appears anxious.  Well groomed, good eye contact.    ASSESSMENT AND PLAN:  Ms. Twilla was seen today for discuss medications.  Diagnoses and all orders for this visit:  Hypertension, essential, benign BP still above goal but improved. Continue chlorthalidone 25 mg daily, we discussed some side effects. We will plan on BMP next visit. Instructed to monitor BP at home. Low-salt diet. Follow-up in 3 to 4 weeks, before if needed.  Heart palpitations This probably has been chronic. Possible etiology discussed, including anxiety and dehydration. Adequate hydration recommended. Instructed about warning signs.  Anxiety disorder, unspecified type Very anxious today,afraid of starting new med or changing medications. She has tried Celexa in the past, it caused some GI symptoms but no serious side effects. She agrees with trying Celexa, we will start with a low-dose 10 mg daily. Instructed about warning signs. Psychotherapy therapy may help. She has  history of bipolar disorder, she may need establishing with psychiatrist in the area.  -     citalopram (CELEXA) 10 MG tablet; Take 1 tablet (10 mg total) by mouth daily.  Bradycardia Intermittently. I recommend decreasing propranolol dose and eventually wean it off if bradycardia continues.  She is reluctant to stop propranolol, it has helped with anxiety and "shaking" episodes. Instructed to skip propranolol if HR 60 or less.   Dizziness We discussed possible etiologies. History and examination today do not suggest a serious process, I think we can hold on brain imaging for now. She was clearly instructed about warning signs. Fall precautions. Follow-up in 3 to 4 weeks, before if needed.     Return in about 4 weeks (around 08/08/2018) for HTN,anxi.     Betty G. Martinique, MD  [facility]. [place] office.

## 2018-07-11 NOTE — Telephone Encounter (Signed)
Patient informed that Rx was sent to pharmacy and verbalized understanding. 

## 2018-07-11 NOTE — Patient Instructions (Signed)
A few things to remember from today's visit:   Hypertension, essential, benign  Heart palpitations  Anxiety disorder, unspecified type - Plan: sertraline (ZOLOFT) 50 MG tablet  No changes in chlorthalidone. Sertraline added today, start with 0.5 tablet for 5 days and increase to the whole tablet if well-tolerated. Today we started sertraline, this type of medications can increase suicidal risk. This is more prevalent among children,adolecents, and young adults with major depression or other psychiatric disorders. It can also make depression worse. Most common side effects are gastrointestinal, self limited after a few weeks: diarrhea, nausea, constipation  Or diarrhea among some.  Small sips of clear fluids through the day, you can mix Pedialyte or Gatorade with water (half-and-half). Try to eat at least 3 meals per day. Do not take propranolol if your heart rate is 70 or less. Monitor blood pressure at home. I will see you back in 4 weeks.  In general it is well tolerated. We will follow closely.      Please be sure medication list is accurate. If a new problem present, please set up appointment sooner than planned today.

## 2018-07-12 ENCOUNTER — Encounter: Payer: Self-pay | Admitting: Family Medicine

## 2018-07-12 ENCOUNTER — Telehealth: Payer: Self-pay | Admitting: Family Medicine

## 2018-07-12 ENCOUNTER — Telehealth: Payer: Self-pay

## 2018-07-12 NOTE — Telephone Encounter (Signed)
Certainly symptoms can be caused by stress and anxiety. Psychotherapy may help. She can also establish with a psychiatrist in the area. I recommend trying to take Celexa for a few weeks, it usually takes 6 to 8 weeks to reach the max effect.  We started with a low-dose Celexa, we could increase it from 10 to 20 mg.  Earlier she was requesting a head CT, I think I can use dizziness for diagnosis, order placed.  But if dizziness gets suddenly worse or she has focal deficit or confusion, she needs to seek immediate medical attention.  Thanks, BJ

## 2018-07-12 NOTE — Telephone Encounter (Signed)
Patient called back to state that she no longer wants to switch providers. She is requesting to keep Dr. Martinique as PCP.

## 2018-07-12 NOTE — Telephone Encounter (Signed)
Message sent to Dr. Jordan for review and approval. 

## 2018-07-12 NOTE — Telephone Encounter (Signed)
Patient phones today after having to leave work due to her feeling dizzy (denies vertigo) and feeling like she will pass out, but doesn't. Stated she saw Dr. Martinique yesterday for this. Celexa 10 MG daily was added to her medications which she began taking yesterday. Stated she takes propranolol 40 mg twice a day for anxiety and tremors. Reports she has already taken 2 doses of it today by 3:00p. Reports she is drinking fluids as told by Dr. Martinique to keep hydrated. Reported she feels very stressed at work and goes nonstop. Discussed possibility stress may be causing her to feel this way, she voiced yes, very possible. Suggested a counselor might be beneficial to talk about her stressors-she agreed and would like a referral. Also reiterated it could take one to two weeks to feel the affect of the anti-depression medication started yesterday.  Patient would like to speak with Dr. Martinique, herself and requesting a call from her..Routing to PCP.

## 2018-07-12 NOTE — Telephone Encounter (Signed)
Spoke with patient and gave recommendations per Dr. Jordan. Patient verbalized understanding. 

## 2018-07-12 NOTE — Telephone Encounter (Signed)
Wrong office, sending to the right office.

## 2018-07-12 NOTE — Telephone Encounter (Signed)
Copied from [place] [id number]. Topic: General - Inquiry >> Jul 12, 2018  8:15 AM Sue Jackson wrote: Reason for CRM:  patient was seen on 07-11-18 by Betty Martinique.  She is requesting a CT scan. She stated the symptoms are on and off. She stated had to leave work yesterday. She is requesting Dr. Martinique give  her a call back. Please advise

## 2018-07-12 NOTE — Telephone Encounter (Signed)
So sorry, thank you.

## 2018-07-12 NOTE — Telephone Encounter (Signed)
Message sent to Dr. Jordan for review. 

## 2018-07-12 NOTE — Telephone Encounter (Signed)
Patient is requesting a phone call

## 2018-07-12 NOTE — Telephone Encounter (Signed)
We need a clear indication for head CT otherwise her insurance will not pay for it. I saw her yesterday and based examination+ Hx I did not feel like she needed head imaging.  If symptoms get suddenly worse, she needs to seek immediate medical attention to have a stat head imaging. I will be glad to see her back if she has further concerns but I am supposed to see her in a few weeks for follow-up.  Thanks, BJ

## 2018-07-13 ENCOUNTER — Ambulatory Visit: Payer: Self-pay | Admitting: Adult Health

## 2018-07-13 ENCOUNTER — Emergency Department (HOSPITAL_COMMUNITY)
Admission: EM | Admit: 2018-07-13 | Discharge: 2018-07-13 | Disposition: A | Discharge status: Home / Self Care | Payer: 59 | Attending: Emergency Medicine | Admitting: Emergency Medicine

## 2018-07-13 ENCOUNTER — Other Ambulatory Visit: Payer: Self-pay

## 2018-07-13 ENCOUNTER — Ambulatory Visit: Payer: Self-pay

## 2018-07-13 ENCOUNTER — Encounter (HOSPITAL_COMMUNITY): Payer: Self-pay

## 2018-07-13 DIAGNOSIS — I10 Essential (primary) hypertension: Secondary | ICD-10-CM | POA: Insufficient documentation

## 2018-07-13 DIAGNOSIS — Z79899 Other long term (current) drug therapy: Secondary | ICD-10-CM | POA: Diagnosis not present

## 2018-07-13 DIAGNOSIS — F419 Anxiety disorder, unspecified: Secondary | ICD-10-CM | POA: Diagnosis present

## 2018-07-13 NOTE — ED Provider Notes (Signed)
[facility] DEPT Provider Note   CSN: [id number] Arrival date & time: 07/13/18  8921     History   Chief Complaint Chief Complaint  Patient presents with  . Anxiety    HPI Sue Jackson is a 37 y.o. female presenting for evaluation of anxiety and high blood pressure.  Patient states over the past 4 days, she has had increasing anxiety.  She is also been having fluctuations in her blood pressure.  Today she noticed that her blood pressure was high.  Per EMR, patient had called her PCP stating her blood pressure was high at 194 systolic.  Patient is on propranolol and chlorthalidone, although chlorthalidone was started just recently.  Patient states she is supposed to take the propranolol twice a day, but has only been taking it once a day due to concerns about her heart rate.  Additionally, patient reports increased stress at her job.  She was started on citalopram, took 1 dose, but has not taken any further, she is concerned about the side effects.  She is not on anything else for anxiety.  She denies headache, vision changes, slurred speech, chest pain, shortness of breath, nausea, vomiting, dental pain, urinary symptoms, abnormal bowel movements.  She states she has no other medical problems, takes no medications daily.  She smokes only "occasionally", none recently.  She has been trying to decrease her caffeine use, but still drinks sodas daily.  She does take NSAIDs several times a week as needed for pain.  Patient states she has an appointment her primary care doctor at 3:00 this afternoon.  Additional history obtained from chart review, patient has been seen multiple times both in the ER and by her PCP recently for anxiety and hypertension.  Patient has an MRI scheduled with her PCP.  HPI  Past Medical History:  Diagnosis Date  . Anxiety   . Arthritis   . Depression   . Hypertension     Patient Active Problem List   Diagnosis Date Noted  .  Anxiety disorder 02/06/2018  . Low back pain radiating to right lower extremity 07/06/2017  . Upper back pain, chronic 07/06/2017  . Hypertension, essential, benign 03/11/2017  . Bipolar disorder with depression ([place]) 03/11/2017  . Class 1 obesity with body mass index (BMI) of 34.0 to 34.9 in adult 03/11/2017    History reviewed. No pertinent surgical history.   OB History   None      Home Medications    Prior to Admission medications   Medication Sig Start Date End Date Taking? Authorizing Provider  chlorthalidone (HYGROTON) 25 MG tablet Take 1 tablet (25 mg total) by mouth daily. 07/11/18   Martinique, Betty G, MD  citalopram (CELEXA) 10 MG tablet Take 1 tablet (10 mg total) by mouth daily. 07/11/18   Martinique, Betty G, MD  meloxicam (MOBIC) 15 MG tablet TAKE 1 TABLET (15 MG TOTAL) BY MOUTH DAILY. TAKE WITH FOOD Patient taking differently: Take 15 mg by mouth daily as needed. Pain 09/12/17   Magnus Sinning, MD  methocarbamol (ROBAXIN) 500 MG tablet TAKE 1 TABLET (500 MG TOTAL) BY MOUTH EVERY 8 (EIGHT) HOURS AS NEEDED FOR MUSCLE SPASMS. 09/12/17   Magnus Sinning, MD  propranolol (INDERAL) 40 MG tablet TAKE 1 TABLET (40 MG) BY MOUTH 2 TIMES DAILY. Patient taking differently: Take 40 mg by mouth 2 (two) times daily.  02/06/18   Martinique, Betty G, MD    Family History Family History  Problem Relation Age of Onset  .  Arthritis Mother   . Asthma Mother   . Cancer Maternal Aunt        breast  . Depression Maternal Aunt   . Hypertension Maternal Aunt   . Diabetes Maternal Grandfather   . Heart disease Maternal Grandfather   . Hypertension Maternal Grandfather   . Stroke Maternal Grandfather     Social History Social History   Tobacco Use  . Smoking status: Former Research scientist (life sciences)  . Smokeless tobacco: Never Used  Substance Use Topics  . Alcohol use: Yes    Comment: occasionally  . Drug use: No     Allergies   Patient has no known allergies.   Review of Systems Review of Systems    Psychiatric/Behavioral: The patient is nervous/anxious.   All other systems reviewed and are negative.    Physical Exam Updated Vital Signs BP (!) 149/101   Pulse 74   Temp 98.7 F (37.1 C) (Oral)   Resp 18   Ht 5\' 7"  (1.702 m)   Wt 103 kg   LMP 06/23/2018   SpO2 100%   BMI 35.55 kg/m   Physical Exam  Constitutional: She is oriented to person, place, and time. She appears well-developed and well-nourished. No distress.  Young female sitting comfortably in the bed in no acute distress  HENT:  Head: Normocephalic and atraumatic.  Eyes: Pupils are equal, round, and reactive to light. Conjunctivae and EOM are normal.  Neck: Normal range of motion. Neck supple.  Cardiovascular: Normal rate, regular rhythm and intact distal pulses.  Pulmonary/Chest: Effort normal and breath sounds normal. No respiratory distress. She has no wheezes.  Speaking in full sentences.  Clear lung sounds in all fields.  Abdominal: Soft. She exhibits no distension and no mass. There is no tenderness. There is no guarding.  Musculoskeletal: Normal range of motion.  Strength intact x4.  Sensation intact x4.  Radial pedal pulses intact bilaterally.  Neurological: She is alert and oriented to person, place, and time.  No obvious neurologic deficits.  CN intact.  Nose to finger intact.  Fine movement and coordination intact.  Grip strength intact.  Skin: Skin is warm and dry. Capillary refill takes less than 2 seconds.  Psychiatric: Her mood appears anxious.  Pt appears anxious  Nursing note and vitals reviewed.    ED Treatments / Results  Labs (all labs ordered are listed, but only abnormal results are displayed) Labs Reviewed - No data to display  EKG None  Radiology No results found.  Procedures Procedures (including critical care time)  Medications Ordered in ED Medications - No data to display   Initial Impression / Assessment and Plan / ED Course  I have reviewed the triage vital signs  and the nursing notes.  Pertinent labs & imaging results that were available during my care of the patient were reviewed by me and considered in my medical decision making (see chart for details).     Patient presenting for evaluation of anxiety and hypertension.  Upon arrival, patient is hypertensive with a systolic blood pressure of 170, although this improved to 601U systolic without intervention.  Otherwise, physical exam reassuring.  No obvious signs of endorgan damage.  As patient's hypertension improved without medication, and without signs of endorgan damage, I do not believe she needs further work-up today in the ER.  Recommend patient follow-up with her PCP for further management of her anxiety and blood pressure.  Discussed at length possible causes for hypertension including NSAID use, caffeine use, and anxiety.  At this time, patient received for discharge.  Return precautions given.  Patient states she understands and agrees plan.   Final Clinical Impressions(s) / ED Diagnoses   Final diagnoses:  Anxiety  Hypertension, unspecified type    ED Discharge Orders    None       Franchot Heidelberg, PA-C 07/13/18 1337    Valarie Merino, MD 07/14/18 [phone]

## 2018-07-13 NOTE — ED Notes (Signed)
Bed: WTR6 Expected date:  Expected time:  Means of arrival:  Comments: 

## 2018-07-13 NOTE — Discharge Instructions (Addendum)
Continue taking home medications as prescribed. It is very important to follow-up with your primary care doctor regarding your blood pressure and anxiety. Return to the emergency room if you develop chest pain, vision changes, slurred speech, numbness/weakness, or any new or concerning symptoms.

## 2018-07-13 NOTE — ED Triage Notes (Signed)
Pt states that she has been in and out of the hospital "a lot:" lately for her anxiety, stating it has gotten worse as she's gotten older.  Pt states she has been taking citralopam for her anxiety.  Pt states that she has been worrying about her BP, that it has been high- pt is unsure if it is because she hasn't been taking her propanolol daily anymore, or due to her anxiety.

## 2018-07-13 NOTE — Telephone Encounter (Signed)
Pt called stating that her blood pressure has been high for the past 24 hours.  She states she was seen at an urgent care and given chlorthalidone 25mg  for her BP. She states she has been having trouble with some anxiety as well.  She states she just doesn't feel good.  She feels generally weak and sometimes nauseated. BP today 158/100 and 152/118 Hr 72. She states she has not ever been dx with hypertension until her visit to the urgent care. Per protocol appointment was offered to patient today but pt decided to go to the ED. Care advice read to patient. Pt verbalized understanding of all instructions.  Reason for Disposition . Systolic BP  >= 446 OR Diastolic >= 286  Answer Assessment - Initial Assessment Questions 1. BLOOD PRESSURE: "What is the blood pressure?" "Did you take at least two measurements 5 minutes apart?"     158/100 and 152/118 hr 72 2. ONSET: "When did you take your blood pressure?"    Yesterday its been hapening 3. HOW: "How did you obtain the blood pressure?" (e.g., visiting nurse, automatic home BP monitor)     Home machine 4. HISTORY: "Do you have a history of high blood pressure?"     Just was placed on fluid pill 5. MEDICATIONS: "Are you taking any medications for blood pressure?" "Have you missed any doses recently?"     chlorthalidone 25 mg 6. OTHER SYMPTOMS: "Do you have any symptoms?" (e.g., headache, chest pain, blurred vision, difficulty breathing, weakness)     weak 7. PREGNANCY: "Is there any chance you are pregnant?" "When was your last menstrual period?"     no  Protocols used: HIGH BLOOD PRESSURE-A-AH

## 2018-07-14 ENCOUNTER — Ambulatory Visit: Payer: Self-pay | Admitting: Adult Health

## 2018-07-14 NOTE — Telephone Encounter (Signed)
Message sent to Dr. Jordan for review. 

## 2018-07-17 ENCOUNTER — Ambulatory Visit: Payer: Self-pay

## 2018-07-17 ENCOUNTER — Telehealth: Payer: Self-pay | Admitting: Family Medicine

## 2018-07-17 NOTE — Telephone Encounter (Signed)
Pt. Concerned about taking Inderal and Hygroton together. Checked BP today 128/78 pulse 62. Feels "a little tired." No other symptoms. Instructed to continue to monitor her BP. Wants to know if Dr. Martinique thinks this is ok. Please advise pt.  Answer Assessment - Initial Assessment Questions 1. SYMPTOMS: "Do you have any symptoms?"     Feels tired 2. SEVERITY: If symptoms are present, ask "Are they mild, moderate or severe?"     Mild  Protocols used: MEDICATION QUESTION CALL-A-AH

## 2018-07-17 NOTE — Telephone Encounter (Signed)
ER note reviewed, it seems like BP improved without intervention, problem seems to be aggravated by anxiety.   If BP still running 140/90 or above, she can start amlodipine 5 mg daily. No changes in chlorthalidone 25 mg. Keep follow-up appointment.  Thanks, BJ

## 2018-07-17 NOTE — Telephone Encounter (Signed)
Copied from [place] [phone]. Topic: General - Other >> Jul 17, 2018  7:59 AM Rayann Heman wrote: Reason for CRM:pt called and stated that she would like to know the cost of MRI on 07/23/18. Please advise

## 2018-07-18 NOTE — Telephone Encounter (Signed)
Message sent to Dr. Jordan for review. 

## 2018-07-18 NOTE — Telephone Encounter (Signed)
Spoke with patient and gave instructions per dr. Martinique. Patient verbalized understanding. Patient stated that she would continue to monitor and let us know if b/p is consistently 140/90 or above.

## 2018-07-18 NOTE — Telephone Encounter (Signed)
Spoke with patient and informed her that she would need to contact the imaging place that scheduled her MRI to inquire about pricing. Patient verbalized understanding.

## 2018-07-18 NOTE — Telephone Encounter (Signed)
In general it is okay to take chlorthalidone and propranolol.  Because she was having episodes of bradycardia, I recommended to consider weaning off propranolol or decreasing dose/frequency. Continue monitoring heart rate and and BP. Keep next follow-up appointment.  Thanks, BJ

## 2018-07-19 NOTE — Telephone Encounter (Signed)
Spoke with patient and she stated that she has negative side effects if she stop propanolol. Patient stated that it may consider decreasing the dose because medication has helped. Right now she would like to stay on medication.

## 2018-07-21 ENCOUNTER — Telehealth: Payer: Self-pay | Admitting: Family Medicine

## 2018-07-21 NOTE — Telephone Encounter (Signed)
Because Propanolol cannot be stopped abruptly, recommend decreasing dose from 40 mg to 20 mg twice daily (1/2 tab bid). During follow-up visit we can plan how to continue decreasing medication.  Thanks, BJ

## 2018-07-21 NOTE — Telephone Encounter (Signed)
Patient called clinic and stated that she has decided that she wants to decrease dose/frequency of Propanolol as advised previously. Please advise

## 2018-07-21 NOTE — Telephone Encounter (Signed)
Copied from [place] [id number]. Topic: General - Other >> Jul 21, 2018 10:16 AM Keene Breath wrote: Reason for CRM: Patient called to leave a message for Dr. Doug Sou nurse.  Patient would not give details but would like the nurse to give her a call back.  CB# [phone]

## 2018-07-21 NOTE — Telephone Encounter (Signed)
Patient given directions per Dr. Jordan and verbalized understanding. 

## 2018-07-23 ENCOUNTER — Other Ambulatory Visit: Payer: Self-pay

## 2018-07-25 ENCOUNTER — Ambulatory Visit: Payer: Self-pay | Admitting: Family Medicine

## 2018-08-15 ENCOUNTER — Ambulatory Visit: Payer: 59 | Admitting: Family Medicine

## 2018-08-15 ENCOUNTER — Encounter: Payer: Self-pay | Admitting: Family Medicine

## 2018-08-15 VITALS — BP 120/70 | HR 68 | Temp 98.7°F | Resp 12 | Ht 67.0 in | Wt 221.1 lb

## 2018-08-15 DIAGNOSIS — R7303 Prediabetes: Secondary | ICD-10-CM | POA: Insufficient documentation

## 2018-08-15 DIAGNOSIS — I10 Essential (primary) hypertension: Secondary | ICD-10-CM | POA: Diagnosis not present

## 2018-08-15 DIAGNOSIS — E6609 Other obesity due to excess calories: Secondary | ICD-10-CM

## 2018-08-15 DIAGNOSIS — Z6834 Body mass index (BMI) 34.0-34.9, adult: Secondary | ICD-10-CM

## 2018-08-15 DIAGNOSIS — F419 Anxiety disorder, unspecified: Secondary | ICD-10-CM

## 2018-08-15 LAB — POCT GLYCOSYLATED HEMOGLOBIN (HGB A1C): Hemoglobin A1C: 6 % — AB (ref 4.0–5.6)

## 2018-08-15 LAB — BASIC METABOLIC PANEL
BUN: 15 mg/dL (ref 6–23)
CHLORIDE: 98 meq/L (ref 96–112)
CO2: 28 mEq/L (ref 19–32)
Calcium: 9 mg/dL (ref 8.4–10.5)
Creatinine, Ser: 0.76 mg/dL (ref 0.40–1.20)
GFR: 89.74 mL/min (ref >60.00)
Glucose, Bld: 134 mg/dL — ABNORMAL HIGH (ref 70–99)
Potassium: 3.5 mEq/L (ref 3.5–5.1)
Sodium: 135 mEq/L (ref 135–145)

## 2018-08-15 MED ORDER — CHLORTHALIDONE 25 MG PO TABS: 25.0000 mg | ORAL_TABLET | Freq: Every day | ORAL | 1 refills | Status: DC

## 2018-08-15 MED ORDER — PROPRANOLOL HCL 40 MG PO TABS: ORAL_TABLET | ORAL | 3 refills | Status: DC

## 2018-08-15 NOTE — Assessment & Plan Note (Signed)
A1c today 6.0 (6.2). Healthy lifestyle for primary prevention recommended.

## 2018-08-15 NOTE — Assessment & Plan Note (Signed)
BP better controlled. For now no changes on chlorthalidone or propranolol.  We discussed some side effects of medications. Continue low-salt diet. Continue monitoring BP at home. Follow-up in 5 months, before if needed.

## 2018-08-15 NOTE — Progress Notes (Signed)
HPI:   [name] is a 38 y.o. female, who is here today to follow on recent OV.  She was last seen on 07/11/2017, when she was concerned about elevated BP. Since her last visit she has been in the ED due to anxiety, 07/13/2017.  For hypertension she is currently on chlorthalidone 25 mg daily. BP readings at home 130s/80s. Occasionally she has SBP 140s, usually when she is under stress. Denies severe/frequent headache, visual changes, chest pain, dyspnea, focal weakness, or edema.  Last visit I recommended decreasing dose of propranolol due to bradycardia. She is still taking propranolol. HR at home in the 60's.  Propranolol was also prescribed in the past to help with anxiety and "shaking" episodes.  She is currently on propranolol 20 mg in the morning and 40 mg at night. She did not tolerate Celexa. In the past she has been on different medication, neither one was well-tolerated.  Lab Results  Component Value Date   CREATININE 0.82 07/10/2018   BUN 19 07/10/2018   NA 138 07/10/2018   K 4.0 07/10/2018   CL 104 07/10/2018   CO2 24 07/10/2018  Lightheadedness has improved. It seems to be exacerbated by episodes of anxiety or when she is "rushing."  She takes OTC Dramamine, which helps. She does not have episodes of lightheadedness if she is "relax."  She is also reporting some weight loss, she has been trying to eat healthier but has not been consistent. She is not exercising regularly. Denies abdominal pain, nausea,vomiting, polydipsia,polyuria, or polyphagia.   Lab Results  Component Value Date   HGBA1C 6.2 (A) 02/06/2018    Review of Systems  Constitutional: Negative for activity change, appetite change, fatigue and fever.  HENT: Negative for mouth sores, nosebleeds and trouble swallowing.   Eyes: Negative for redness and visual disturbance.  Respiratory: Negative for cough, shortness of breath and wheezing.   Cardiovascular: Negative for chest pain,  palpitations and leg swelling.  Gastrointestinal: Negative for abdominal pain, nausea and vomiting.       Negative for changes in bowel habits.  Endocrine: Negative for polydipsia, polyphagia and polyuria.  Genitourinary: Negative for decreased urine volume and hematuria.  Neurological: Positive for light-headedness. Negative for syncope, weakness and headaches.  Psychiatric/Behavioral: Negative for confusion. The patient is nervous/anxious.      No current outpatient medications on file prior to visit.   No current facility-administered medications on file prior to visit.      Past Medical History:  Diagnosis Date  . Anxiety   . Arthritis   . Depression   . Hypertension    No Known Allergies  Social History   Socioeconomic History  . Marital status: Married    Spouse name: Not on file  . Number of children: Not on file  . Years of education: Not on file  . Highest education level: Not on file  Occupational History  . Not on file  Social Needs  . Financial resource strain: Not on file  . Food insecurity:    Worry: Not on file    Inability: Not on file  . Transportation needs:    Medical: Not on file    Non-medical: Not on file  Tobacco Use  . Smoking status: Former Research scientist (life sciences)  . Smokeless tobacco: Never Used  Substance and Sexual Activity  . Alcohol use: Yes    Comment: occasionally  . Drug use: No  . Sexual activity: Yes    Birth control/protection: None  Lifestyle  . Physical activity:    Days per week: Not on file    Minutes per session: Not on file  . Stress: Not on file  Relationships  . Social connections:    Talks on phone: Not on file    Gets together: Not on file    Attends religious service: Not on file    Active member of club or organization: Not on file    Attends meetings of clubs or organizations: Not on file    Relationship status: Not on file  Other Topics Concern  . Not on file  Social History Narrative  . Not on file    Vitals:    08/15/18 0917  BP: 120/70  Pulse: 68  Resp: 12  Temp: 98.7 F (37.1 C)  SpO2: 99%   Body mass index is 34.63 kg/m.  Wt Readings from Last 3 Encounters:  08/15/18 221 lb 2 oz (100.3 kg)  07/13/18 227 lb (103 kg)  07/11/18 223 lb 6 oz (101.3 kg)     Physical Exam  Nursing note and vitals reviewed. Constitutional: She is oriented to person, place, and time. She appears well-developed. No distress.  HENT:  Head: Normocephalic and atraumatic.  Mouth/Throat: Oropharynx is clear and moist and mucous membranes are normal.  Eyes: Pupils are equal, round, and reactive to light. Conjunctivae are normal.  Cardiovascular: Normal rate and regular rhythm.  No murmur heard. Pulses:      Dorsalis pedis pulses are 2+ on the right side and 2+ on the left side.  Respiratory: Effort normal and breath sounds normal. No respiratory distress.  GI: Soft. She exhibits no mass. There is no hepatomegaly. There is no abdominal tenderness.  Musculoskeletal:        General: No edema.  Lymphadenopathy:    She has no cervical adenopathy.  Neurological: She is alert and oriented to person, place, and time. She has normal strength. No cranial nerve deficit. Gait normal.  Skin: Skin is warm. No rash noted. No erythema.  Psychiatric: Her mood appears anxious.  Well groomed, good eye contact.    ASSESSMENT AND PLAN:  Sue Jackson was seen today for follow-up.   Orders Placed This Encounter  Procedures  . Basic metabolic panel  . POCT glycosylated hemoglobin (Hb A1C)    Lab Results  Component Value Date   CREATININE 0.76 08/15/2018   BUN 15 08/15/2018   NA 135 08/15/2018   K 3.5 08/15/2018   CL 98 08/15/2018   CO2 28 08/15/2018   Lab Results  Component Value Date   HGBA1C 6.0 (A) 08/15/2018    Hypertension, essential, benign BP better controlled. For now no changes on chlorthalidone or propranolol.  We discussed some side effects of medications. Continue low-salt diet. Continue  monitoring BP at home. Follow-up in 5 months, before if needed.  Class 1 obesity with body mass index (BMI) of 34.0 to 34.9 in adult We discussed benefits of wt loss as well as adverse effects of obesity. Consistency with healthy diet and physical activity recommended.   Prediabetes A1c today 6.0 (6.2). Healthy lifestyle for primary prevention recommended.  Anxiety disorder She has not tolerated SSRIs or SNRI's. Recommend arranging appointment with psychotherapist, a few names of providers around the area was given. No changes in propranolol. Follow-up in 4 to 5 months, before if needed.   Return in about 5 months (around 01/14/2019) for HTN and anxiety + fasting labs.    Neev Mcmains G. Martinique, MD  [facility].  [place] office.

## 2018-08-15 NOTE — Patient Instructions (Addendum)
A few things to remember from today's visit:   Hypertension, essential, benign - Plan: Basic metabolic panel, propranolol (INDERAL) 40 MG tablet  Prediabetes - Plan: POCT glycosylated hemoglobin (Hb A1C)  Anxiety disorder, unspecified type - Plan: propranolol (INDERAL) 40 MG tablet  PSYCHIATRIC OFFICES AND PSYCHIATRISTS IN THE AREA   When psychiatric evaluation is discussed or recommended referral is not necessary. This is a list of options around [place], [place] that you can call and arrange an appointment.  -[facility] [phone] [place] [phone] [facility] [phone] -[facility] [phone]  Dr Hardie Shackleton [phone] Dr Alexander Bergeron, MD [phone] Dr Chucky May, MD [phone]   Dr Allayne Butcher. Marcelino Freestone, MD [phone] Dr Geralyn Flash [facility]  Dr Sheralyn Boatman, MD [phone]  Dr Fredonia Highland, MD [phone]  Dr Luz Lex [phone]  Please be sure medication list is accurate. If a new problem present, please set up appointment sooner than planned today.

## 2018-08-15 NOTE — Assessment & Plan Note (Signed)
She has not tolerated SSRIs or SNRI's. Recommend arranging appointment with psychotherapist, a few names of providers around the area was given. No changes in propranolol. Follow-up in 4 to 5 months, before if needed.

## 2018-08-15 NOTE — Assessment & Plan Note (Signed)
We discussed benefits of wt loss as well as adverse effects of obesity. Consistency with healthy diet and physical activity recommended.  

## 2018-09-04 ENCOUNTER — Encounter (HOSPITAL_COMMUNITY): Payer: Self-pay

## 2018-09-04 ENCOUNTER — Ambulatory Visit: Payer: Self-pay | Admitting: *Deleted

## 2018-09-04 ENCOUNTER — Emergency Department (HOSPITAL_COMMUNITY): Admission: EM | Admit: 2018-09-04 | Discharge: 2018-09-04 | Discharge status: Against Medical Advice | Payer: 59

## 2018-09-04 DIAGNOSIS — Z5321 Procedure and treatment not carried out due to patient leaving prior to being seen by health care provider: Secondary | ICD-10-CM

## 2018-09-04 LAB — I-STAT BETA HCG BLOOD, ED (MC, WL, AP ONLY): I-stat hCG, quantitative: <5 m[IU]/mL (ref <5)

## 2018-09-04 LAB — BASIC METABOLIC PANEL
Anion gap: 15 (ref 5–15)
BUN: 12 mg/dL (ref 6–20)
CO2: 26 mmol/L (ref 22–32)
Calcium: 9 mg/dL (ref 8.9–10.3)
Chloride: 87 mmol/L — ABNORMAL LOW (ref 98–111)
Creatinine, Ser: 0.69 mg/dL (ref 0.44–1.00)
GFR calc Af Amer: >60 mL/min (ref >60)
Glucose, Bld: 117 mg/dL — ABNORMAL HIGH (ref 70–99)
Potassium: 2.7 mmol/L — CL (ref 3.5–5.1)
Sodium: 128 mmol/L — ABNORMAL LOW (ref 135–145)

## 2018-09-04 LAB — CBC
HCT: 45.4 % (ref 36.0–46.0)
Hemoglobin: 15.1 g/dL — ABNORMAL HIGH (ref 12.0–15.0)
MCH: 26.8 pg (ref 26.0–34.0)
MCHC: 33.3 g/dL (ref 30.0–36.0)
MCV: 80.6 fL (ref 80.0–100.0)
NRBC: 0 % (ref 0.0–0.2)
Platelets: 353 10*3/uL (ref 150–400)
RBC: 5.63 MIL/uL — ABNORMAL HIGH (ref 3.87–5.11)
RDW: 14.5 % (ref 11.5–15.5)
WBC: 13 10*3/uL — AB (ref 4.0–10.5)

## 2018-09-04 LAB — URINALYSIS, ROUTINE W REFLEX MICROSCOPIC
Bilirubin Urine: NEGATIVE
GLUCOSE, UA: NEGATIVE mg/dL
Ketones, ur: NEGATIVE mg/dL
Leukocytes, UA: NEGATIVE
Nitrite: NEGATIVE
PROTEIN: NEGATIVE mg/dL
Specific Gravity, Urine: 1.002 — ABNORMAL LOW (ref 1.005–1.030)
pH: 7 (ref 5.0–8.0)

## 2018-09-04 LAB — CBG MONITORING, ED: Glucose-Capillary: 113 mg/dL — ABNORMAL HIGH (ref 70–99)

## 2018-09-04 NOTE — Telephone Encounter (Signed)
Noted. Sue Santee Martinique, MD

## 2018-09-04 NOTE — Telephone Encounter (Signed)
Will send to PCP as Juluis Rainier

## 2018-09-04 NOTE — ED Triage Notes (Signed)
Pt here for evaluation of hypertension, and feeling "off balance". Pt reports checking her BP at home and it was 160/117 and 150/107. Pts BP 149/97 in triage. Pt reports she has been eating a lot of candy.

## 2018-09-04 NOTE — Telephone Encounter (Signed)
Patient is calling stating she just doesn't feel well- patient advised to go to ED- her husband will take her.   Reason for Disposition . [8] Systolic BP  >= 850 OR Diastolic >= 277 AND [4] cardiac or neurologic symptoms (e.g., chest pain, difficulty breathing, unsteady gait, blurred vision)  Answer Assessment - Initial Assessment Questions 1. BLOOD PRESSURE: "What is the blood pressure?" "Did you take at least two measurements 5 minutes apart?"     160/117, 150/107 2. ONSET: "When did you take your blood pressure?"     3:25 3. HOW: "How did you obtain the blood pressure?" (e.g., visiting nurse, automatic home BP monitor)     Automatic BP cuff 4. HISTORY: "Do you have a history of high blood pressure?"     yes 5. MEDICATIONS: "Are you taking any medications for blood pressure?" "Have you missed any doses recently?"     Yes- no missed pills 6. OTHER SYMPTOMS: "Do you have any symptoms?" (e.g., headache, chest pain, blurred vision, difficulty breathing, weakness)     Light headed 7. PREGNANCY: "Is there any chance you are pregnant?" "When was your last menstrual period?"     No- LMP last month- patient is not feling good- did not push for dates  Protocols used: HIGH BLOOD PRESSURE-A-AH

## 2018-09-04 NOTE — Telephone Encounter (Signed)
FYI sent to Dr. Jordan for review. 

## 2018-09-12 ENCOUNTER — Ambulatory Visit: Payer: Self-pay | Admitting: *Deleted

## 2018-09-12 NOTE — Telephone Encounter (Signed)
Yesterday at work, pt felt bad and checked her b/p at home and it was 158/98. She feels like it goes up when she is moving around at work.  She gets hot and dizzy.   She does have some stress at work.  Checked her b/p right now and it is 137/82 and HR 82 and after about 5 mins it was 128/83 HR 76. She is at home. She feels like she needs to have her b/p medication readjusted.  Requested an appointment tomorrow. Scheduled.  Advised to call if she starts having symptoms with an elevated b/p: increase in dizziness, headache, weakness, blurred vision or shortness of breath. She would need to get to an ED or call 911. Pt voiced understanding. Routing to flow at [facility] at [place].   Reason for Disposition . [3] Systolic BP  >= 235 OR Diastolic >= 80 AND [5] taking BP medications  Answer Assessment - Initial Assessment Questions 1. BLOOD PRESSURE: "What is the blood pressure?" "Did you take at least two measurements 5 minutes apart?"    137/82 HR 82 and 128/83  HR 76 2. ONSET: "When did you take your blood pressure?"     This morning 3. HOW: "How did you obtain the blood pressure?" (e.g., visiting nurse, automatic home BP monitor)     Automatic home BP monitor 4. HISTORY: "Do you have a history of high blood pressure?"     yes 5. MEDICATIONS: "Are you taking any medications for blood pressure?" "Have you missed any doses recently?"     Yes and not missed any doses 6. OTHER SYMPTOMS: "Do you have any symptoms?" (e.g., headache, chest pain, blurred vision, difficulty breathing, weakness)     Dizzy and hot, not feeling good 7. PREGNANCY: "Is there any chance you are pregnant?" "When was your last menstrual period?"     Not pregnant. LMP 08/14/18  Protocols used: HIGH BLOOD PRESSURE-A-AH

## 2018-09-12 NOTE — Telephone Encounter (Signed)
Noted  

## 2018-09-12 NOTE — Telephone Encounter (Signed)
Pt coming in 09/13/2018 Nothing further needed.

## 2018-09-13 ENCOUNTER — Encounter: Payer: Self-pay | Admitting: Family Medicine

## 2018-09-13 ENCOUNTER — Ambulatory Visit: Payer: 59 | Admitting: Family Medicine

## 2018-09-13 VITALS — BP 132/84 | HR 79 | Temp 98.1°F | Resp 12 | Ht 67.0 in | Wt 218.5 lb

## 2018-09-13 DIAGNOSIS — I10 Essential (primary) hypertension: Secondary | ICD-10-CM | POA: Diagnosis not present

## 2018-09-13 DIAGNOSIS — F319 Bipolar disorder, unspecified: Secondary | ICD-10-CM | POA: Diagnosis not present

## 2018-09-13 DIAGNOSIS — F419 Anxiety disorder, unspecified: Secondary | ICD-10-CM

## 2018-09-13 DIAGNOSIS — E876 Hypokalemia: Secondary | ICD-10-CM | POA: Diagnosis not present

## 2018-09-13 MED ORDER — AMLODIPINE BESY-BENAZEPRIL HCL 5-10 MG PO CAPS: 1.0000 | ORAL_CAPSULE | Freq: Every day | ORAL | 1 refills | Status: DC

## 2018-09-13 NOTE — Patient Instructions (Addendum)
A few things to remember from today's visit:   Hypertension, essential, benign - Plan: amLODipine-benazepril (LOTREL) 5-10 MG capsule  Hypokalemia - Plan: Basic metabolic panel  Continue monitoring blood pressure. Low-salt diet. Chlorthalidone discontinued due to low potassium. Lotrel, which is a combination of amlodipine and benazepril, low-dose, started today. Potassium check in 7 to 10 days.  Strongly recommend establishing with psychiatrist and psychotherapist.   Please arrange a 4 weeks follow-up.

## 2018-09-13 NOTE — Assessment & Plan Note (Signed)
Exacerbated by work. Problem is not well controlled, it may be contributing to elevated BPs. Recommend establishing with psychotherapist.

## 2018-09-13 NOTE — Assessment & Plan Note (Signed)
In the past SSRI medications have caused manic-like symptoms. We discussed pharmacologic treatment options, she is not interested in trying Lamictal. Strongly recommend establishing with a psychiatrist.

## 2018-09-13 NOTE — Assessment & Plan Note (Addendum)
Anxiety may be playing a role on elevated BPs. Because hypokalemia, chlorthalidone discontinued today. Lotrel 5-10 mg once daily started today, some side effect discussed. BMP in 7 to 10 days. Continue monitoring BP. Follow-up in 4 weeks, before if needed.

## 2018-09-13 NOTE — Progress Notes (Signed)
ACUTE VISIT   HPI:  Chief Complaint  Patient presents with  . Discuss medication    possible change in blood pressure medication    Sue Jackson is a 38 y.o. female, who is here today complaining of elevated BP.  She is on Chlorthalidone 25 mg daily and Propranolol 40 mg,she takes 1/2 tab am and 1 tab at night.  BP elevations exacerbated by stress at work. Usually when she is at home and after relaxing BP is 120's/80's.  She has "little" CP,retrosternal that last hours,not related to exertion and no associated dyspnea. +Palpitations,diaphoresis,and lightheadedness while she is at work,intermittently. While she is at work she is not checking her BP but she assumes that high BP is causing above symptoms.   Lab Results  Component Value Date   CREATININE 0.69 09/04/2018   BUN 12 09/04/2018   NA 128 (L) 09/04/2018   K 2.7 (LL) 09/04/2018   CL 87 (L) 09/04/2018   CO2 26 09/04/2018   Anxiety and Hx of bipolar disorder. She has not established with psychiatrists as recommended last OV.  Problem is exacerbated by work and feels better when she is at home. Denies suicidal thoughts.  She has not tolerated well antidepressant or anxiolytics. Propranolol has helped with anxiety. Medication dose was decreased because bradycardia. HR at home low 60's and 70's.   Review of Systems  Constitutional: Positive for fatigue. Negative for activity change, appetite change and fever.  HENT: Negative for mouth sores, nosebleeds and trouble swallowing.   Eyes: Negative for redness and visual disturbance.  Respiratory: Negative for cough, shortness of breath and wheezing.   Cardiovascular: Negative for chest pain, palpitations and leg swelling.  Gastrointestinal: Negative for abdominal pain, nausea and vomiting.       Negative for changes in bowel habits.  Genitourinary: Negative for decreased urine volume, difficulty urinating, dysuria and hematuria.  Neurological: Positive  for light-headedness. Negative for syncope, weakness, numbness and headaches.  Psychiatric/Behavioral: Negative for confusion and hallucinations. The patient is nervous/anxious.       Current Outpatient Medications on File Prior to Visit  Medication Sig Dispense Refill  . propranolol (INDERAL) 40 MG tablet TAKE 20 mg Am and 40 MG BY MOUTH. 180 tablet 3   No current facility-administered medications on file prior to visit.      Past Medical History:  Diagnosis Date  . Anxiety   . Arthritis   . Depression   . Hypertension    No Known Allergies  Social History   Socioeconomic History  . Marital status: Married    Spouse name: Not on file  . Number of children: Not on file  . Years of education: Not on file  . Highest education level: Not on file  Occupational History  . Not on file  Social Needs  . Financial resource strain: Not on file  . Food insecurity:    Worry: Not on file    Inability: Not on file  . Transportation needs:    Medical: Not on file    Non-medical: Not on file  Tobacco Use  . Smoking status: Former Research scientist (life sciences)  . Smokeless tobacco: Never Used  Substance and Sexual Activity  . Alcohol use: Yes    Comment: occasionally  . Drug use: No  . Sexual activity: Yes    Birth control/protection: None  Lifestyle  . Physical activity:    Days per week: Not on file    Minutes per session: Not on file  .  Stress: Not on file  Relationships  . Social connections:    Talks on phone: Not on file    Gets together: Not on file    Attends religious service: Not on file    Active member of club or organization: Not on file    Attends meetings of clubs or organizations: Not on file    Relationship status: Not on file  Other Topics Concern  . Not on file  Social History Narrative  . Not on file    Vitals:   09/13/18 0733  BP: 132/84  Pulse: 79  Resp: 12  Temp: 98.1 F (36.7 C)  SpO2: 98%   Body mass index is 34.22 kg/m.   Physical Exam  Nursing  note and vitals reviewed. Constitutional: She is oriented to person, place, and time. She appears well-developed. No distress.  HENT:  Head: Normocephalic and atraumatic.  Mouth/Throat: Oropharynx is clear and moist and mucous membranes are normal.  Eyes: Pupils are equal, round, and reactive to light. Conjunctivae are normal.  Cardiovascular: Normal rate and regular rhythm.  No murmur heard. Pulses:      Dorsalis pedis pulses are 2+ on the right side and 2+ on the left side.  Respiratory: Effort normal and breath sounds normal. No respiratory distress.  GI: Soft. She exhibits no mass. There is no hepatomegaly. There is no abdominal tenderness.  Musculoskeletal:        General: No edema.  Lymphadenopathy:    She has no cervical adenopathy.  Neurological: She is alert and oriented to person, place, and time. She has normal strength. No cranial nerve deficit. Gait normal.  Skin: Skin is warm. No rash noted. No erythema.  Psychiatric: Her mood appears anxious.  Well groomed, good eye contact.      ASSESSMENT AND PLAN:   Ms. Mylene was seen today for discuss medication.  Orders Placed This Encounter  Procedures  . Basic metabolic panel    Hypokalemia Chlorthalidone discontinued. K+ rich diet. Further recommendations will be given according to lab results. BMP in 7-10 days.   Bipolar disorder with depression ([place]) In the past SSRI medications have caused manic-like symptoms. We discussed pharmacologic treatment options, she is not interested in trying Lamictal. Strongly recommend establishing with a psychiatrist.  Hypertension, essential, benign Anxiety may be playing a role on elevated BPs. Because hypokalemia, chlorthalidone discontinued today. Lotrel 5-10 mg once daily started today, some side effect discussed. BMP in 7 to 10 days. Continue monitoring BP. Follow-up in 4 weeks, before if needed.  Anxiety disorder Exacerbated by work. Problem is not well  controlled, it may be contributing to elevated BPs. Recommend establishing with psychotherapist.     Return in about 4 weeks (around 10/11/2018) for HTN. Lab in 7-10 days.    Tzivia Oneil G. Martinique, MD  [facility]. [place] office.

## 2018-09-15 NOTE — ED Provider Notes (Signed)
Patient left without being seen  1. Patient left without being seen       Deno Etienne, DO 09/15/18 1513

## 2018-09-19 ENCOUNTER — Other Ambulatory Visit: Payer: 59

## 2018-10-10 ENCOUNTER — Encounter: Payer: Self-pay | Admitting: Family Medicine

## 2018-10-10 ENCOUNTER — Ambulatory Visit (INDEPENDENT_AMBULATORY_CARE_PROVIDER_SITE_OTHER): Payer: 59 | Admitting: Family Medicine

## 2018-10-10 VITALS — BP 130/80 | HR 66 | Temp 98.3°F | Resp 12 | Ht 67.0 in | Wt 220.2 lb

## 2018-10-10 DIAGNOSIS — I10 Essential (primary) hypertension: Secondary | ICD-10-CM

## 2018-10-10 DIAGNOSIS — E876 Hypokalemia: Secondary | ICD-10-CM

## 2018-10-10 LAB — BASIC METABOLIC PANEL
BUN: 13 mg/dL (ref 6–23)
CO2: 24 mEq/L (ref 19–32)
Calcium: 8.8 mg/dL (ref 8.4–10.5)
Chloride: 101 mEq/L (ref 96–112)
Creatinine, Ser: 0.76 mg/dL (ref 0.40–1.20)
GFR: 84.37 mL/min (ref >60.00)
GLUCOSE: 112 mg/dL — AB (ref 70–99)
Potassium: 3.9 mEq/L (ref 3.5–5.1)
Sodium: 136 mEq/L (ref 135–145)

## 2018-10-10 MED ORDER — AMLODIPINE BESY-BENAZEPRIL HCL 5-10 MG PO CAPS: 1.0000 | ORAL_CAPSULE | Freq: Every day | ORAL | 1 refills | Status: DC

## 2018-10-10 NOTE — Patient Instructions (Addendum)
A few things to remember from today's visit:   Hypokalemia - Plan: Basic metabolic panel  Hypertension, essential, benign - Plan: amLODipine-benazepril (LOTREL) 5-10 MG capsule  Continue monitoring blood pressure. No changes in your medications today. We will plan on checking your cholesterol next visit.  Please be sure medication list is accurate. If a new problem present, please set up appointment sooner than planned today.

## 2018-10-10 NOTE — Progress Notes (Signed)
Sue Jackson is a 38 y.o.female, who is here today to follow on HTN. Last follow up visit: 09/13/2018. Problem is exacerbated by stress/anxiety.  Currently on Lotrel 5-10 mg daily She was on propranolol 20 mg a.m and 40 mg at night, medication also helps with anxiety.  She has decreased dose of propranolol to 20 mg daily in the morning, planning on taking another dose if needed for panic attacks.  Anxiety also seems to be doing better.  She was on chlorthalidone 25 mg daily, discontinued because of hypokalemia.  She is taking medications as instructed, no side effects reported.  She has not noted unusual headache, visual changes, exertional chest pain, dyspnea,  focal weakness, or edema. Home BP readings: 120's/70's most of the time, when she checks BP right after work it is 140/80.    Lab Results  Component Value Date   CREATININE 0.69 09/04/2018   BUN 12 09/04/2018   NA 128 (L) 09/04/2018   K 2.7 (LL) 09/04/2018   CL 87 (L) 09/04/2018   CO2 26 09/04/2018    Review of Systems  Constitutional: Positive for fatigue (No more than usual.). Negative for activity change, appetite change and fever.  Eyes: Negative for redness and visual disturbance.  Respiratory: Negative for cough, shortness of breath and wheezing.   Cardiovascular: Negative for chest pain, palpitations and leg swelling.  Gastrointestinal: Negative for abdominal pain, nausea and vomiting.       Negative for changes in bowel habits.  Genitourinary: Negative for decreased urine volume and hematuria.  Neurological: Negative for syncope, weakness and headaches.  Psychiatric/Behavioral: Negative for confusion. The patient is nervous/anxious.     Current Outpatient Medications on File Prior to Visit  Medication Sig Dispense Refill  . propranolol (INDERAL) 40 MG tablet TAKE 20 mg Am and 40 MG BY MOUTH. 180 tablet 3   No current facility-administered medications on file prior to visit.      Past Medical  History:  Diagnosis Date  . Anxiety   . Arthritis   . Depression   . Hypertension     No Known Allergies  Social History   Socioeconomic History  . Marital status: Married    Spouse name: Not on file  . Number of children: Not on file  . Years of education: Not on file  . Highest education level: Not on file  Occupational History  . Not on file  Social Needs  . Financial resource strain: Not on file  . Food insecurity:    Worry: Not on file    Inability: Not on file  . Transportation needs:    Medical: Not on file    Non-medical: Not on file  Tobacco Use  . Smoking status: Former Research scientist (life sciences)  . Smokeless tobacco: Never Used  Substance and Sexual Activity  . Alcohol use: Yes    Comment: occasionally  . Drug use: No  . Sexual activity: Yes    Birth control/protection: None  Lifestyle  . Physical activity:    Days per week: Not on file    Minutes per session: Not on file  . Stress: Not on file  Relationships  . Social connections:    Talks on phone: Not on file    Gets together: Not on file    Attends religious service: Not on file    Active member of club or organization: Not on file    Attends meetings of clubs or organizations: Not on file    Relationship status:  Not on file  Other Topics Concern  . Not on file  Social History Narrative  . Not on file    Vitals:   10/10/18 0657  BP: 130/80  Pulse: 66  Resp: 12  Temp: 98.3 F (36.8 C)  SpO2: 99%   Body mass index is 34.5 kg/m.   Physical Exam  Nursing note and vitals reviewed. Constitutional: She is oriented to person, place, and time. She appears well-developed. No distress.  HENT:  Head: Normocephalic and atraumatic.  Mouth/Throat: Oropharynx is clear and moist and mucous membranes are normal.  Eyes: Pupils are equal, round, and reactive to light. Conjunctivae are normal.  Cardiovascular: Normal rate and regular rhythm.  No murmur heard. Respiratory: Effort normal and breath sounds normal. No  respiratory distress.  GI: Soft. She exhibits no mass. There is no hepatomegaly. There is no abdominal tenderness.  Musculoskeletal:        General: No edema.  Lymphadenopathy:    She has no cervical adenopathy.  Neurological: She is alert and oriented to person, place, and time. She has normal strength. No cranial nerve deficit. Gait normal.  Skin: Skin is warm. No rash noted. No erythema.  Psychiatric: Her mood appears anxious.  Well groomed, good eye contact.    ASSESSMENT AND PLAN:   Ms. Marcea was seen today for follow-up.  Diagnoses and all orders for this visit: Lab Results  Component Value Date   CREATININE 0.76 10/10/2018   BUN 13 10/10/2018   NA 136 10/10/2018   K 3.9 10/10/2018   CL 101 10/10/2018   CO2 24 10/10/2018    Hypokalemia Further recommendation will be given according to BMP results.  -     Basic metabolic panel  Hypertension, essential, benign BP adequately controlled today. She has tolerated Lotrel 5-10 mg well, so no changes. Recommend low-salt diet. Continue monitoring BP. Follow-up in 5 months, before if needed.   Return in about 5 months (around 03/12/2019) for htn.    Antwaine Boomhower G. Martinique, MD  [facility]. [place] office.

## 2018-10-10 NOTE — Assessment & Plan Note (Signed)
BP adequately controlled today. She has tolerated Lotrel 5-10 mg well, so no changes. Recommend low-salt diet. Continue monitoring BP. Follow-up in 5 months, before if needed.

## 2018-10-11 ENCOUNTER — Ambulatory Visit: Payer: Self-pay | Admitting: Family Medicine

## 2018-10-25 ENCOUNTER — Telehealth: Payer: Self-pay | Admitting: Family Medicine

## 2018-10-25 ENCOUNTER — Encounter: Payer: Self-pay | Admitting: *Deleted

## 2018-10-25 ENCOUNTER — Ambulatory Visit: Payer: 59 | Admitting: Family Medicine

## 2018-10-25 ENCOUNTER — Other Ambulatory Visit: Payer: Self-pay | Admitting: Family Medicine

## 2018-10-25 ENCOUNTER — Other Ambulatory Visit: Payer: Self-pay | Admitting: *Deleted

## 2018-10-25 DIAGNOSIS — R6889 Other general symptoms and signs: Secondary | ICD-10-CM

## 2018-10-25 DIAGNOSIS — Z20828 Contact with and (suspected) exposure to other viral communicable diseases: Secondary | ICD-10-CM

## 2018-10-25 DIAGNOSIS — R0989 Other specified symptoms and signs involving the circulatory and respiratory systems: Secondary | ICD-10-CM

## 2018-10-25 NOTE — Telephone Encounter (Signed)
Questions for Screening COVID-19  Symptom onset:  Cough x 1 week -- taking OTC meds for sx's Runny nose Sore throat Very congested - light yellow to clear subj fever Chills and body aches in the beginning. Woke up sweaty a few nights.  NO SOB, HA  Travel or Contacts:  Positive Covid in apartment complex- unaware of what unit this person lives in. Received an email about the positive case from apt management.   During this illness, did/does the patient experience any of the following symptoms? Fever >100.42F []   Yes [x]   No []   Unknown Subjective fever (felt feverish) [x]   Yes []   No []   Unknown Chills [x]   Yes []   No []   Unknown not current Muscle aches (myalgia) [x]   Yes []   No []   Unknown  not current Runny nose (rhinorrhea) [x]   Yes []   No []   Unknown Sore throat [x]   Yes []   No []   Unknown Cough (new onset or worsening of chronic cough) [x]   Yes []   No []   Unknown Shortness of breath (dyspnea) [x]   Yes []   No []   Unknown Nausea or vomiting []   Yes [x]   No []   Unknown Headache []   Yes [x]   No []   Unknown Abdominal pain  []   Yes [x]   No []   Unknown Diarrhea (?3 loose/looser than normal stools/24hr period) []   Yes [x]   No []   Unknown Other, specify:_____________________________________________   Patient risk factors: Smoker? []   Current []   Former []   Never If female, currently pregnant? []   Yes []   No  Patient Active Problem List   Diagnosis Date Noted  . Prediabetes 08/15/2018  . Anxiety disorder 02/06/2018  . Low back pain radiating to right lower extremity 07/06/2017  . Upper back pain, chronic 07/06/2017  . Hypertension, essential, benign 03/11/2017  . Bipolar disorder with depression ([place]) 03/11/2017  . Class 1 obesity with body mass index (BMI) of 34.0 to 34.9 in adult 03/11/2017    Plan:  []   High risk for COVID-19 with red flags go to ED (with CP, SOB, weak/lightheaded, or fever > 101.5). Call ahead.  []   High risk for COVID-19 but stable will have car visit.  Inform provider and coordinate time. Will be completed in afternoon. []   No red flags but URI signs or symptoms will go through side door and be seen in dedicated room.  Note: Referral to telemedicine is an appropriate alternative disposition for higher risk but stable. [facility]/e-Visit: [phone].

## 2018-10-25 NOTE — Telephone Encounter (Signed)
Excuse note letter can be provided. Thanks, BJ

## 2018-10-25 NOTE — Telephone Encounter (Signed)
Letter completed.

## 2018-10-25 NOTE — Telephone Encounter (Signed)
Patient is being sent straight over to [facility] to be tested.  Pt is requesting a note for work since she will have to self quarantine for 4 days.   Please advise Dr Martinique, thanks.   Pt has been sent a mychart activation code so that she will not have to come pick up the letter if written.

## 2018-10-25 NOTE — Telephone Encounter (Signed)
Pt would like to be uploaded to her my chart

## 2018-10-25 NOTE — Telephone Encounter (Signed)
Copied from [place] [phone]. Topic: General - Other >> Oct 25, 2018  2:11 PM Carolyn Stare wrote:  Pt call to say she was tested today for the virus and needing a note for her job, she said she has to be quarantine for 5 days

## 2018-10-31 LAB — NOVEL CORONAVIRUS, NAA: SARS-COV-2, NAA: NOT DETECTED

## 2018-11-01 NOTE — Telephone Encounter (Signed)
Pt called in to ask, pt says that her spouse was in quarantine with her, she would like to know if PCP could provider her spouse with a note also?   Please advise.

## 2018-11-01 NOTE — Telephone Encounter (Signed)
Left message for patient to return call to clinic concerning letter. 

## 2018-11-03 NOTE — Telephone Encounter (Signed)
Left detailed message informing that letter cannot be provided to spouse if they aren't a patient of Dr. Doug Sou, advised to call office with any questions. Nothing further needed at this time.

## 2018-11-16 ENCOUNTER — Ambulatory Visit: Payer: Self-pay

## 2018-11-16 NOTE — Telephone Encounter (Signed)
Noted  

## 2018-11-16 NOTE — Telephone Encounter (Signed)
Pt scheduled with Dr Martinique on Monday 11/20/18 @8am  - pt did not want to see another provider today since Dr Martinique knows her medical history. Pt is aware that we are here is she changes her mind and wants to be seen today.   Will send to Dr Martinique as Juluis Rainier.

## 2018-11-16 NOTE — Telephone Encounter (Signed)
Pt. Called to report intermittent episodes of feeling generally weak, light- headed, breaking out in sweat, heart palpitations, chest discomfort, and nausea.  Denied chest discomfort @ present time.  Denied  shortness of breath.  Stated the feeling of general weakness has been off and on, but seemed to reoccur over past 2 days.  Stated she works nights at [employer]; felt weak at work last night, and continued feeling bad this morning.  Reported episode of feeling heart race recently, and pulse showed "116" on phone app.  Reported she has been decreasing dose of her Propranolol; was on 40 mg BID, and has weaned down to 20 mg qd. Reported this was advised by PCP.  Reported reading side effects of coming off this medication, and feels her symptoms are caused by dosage decrease.  Called FC ; transferred pt. To FC to be scheduled for an appt.  Pt. Agreed with plan.    Reason for Disposition . [1] MODERATE weakness (i.e., interferes with work, school, normal activities) AND [2] persists > 3 days    C/o intermittent general weakness with feeling of light headed, some sweating, heart racing, mid-chest discomfort, nausea.  Denied chest discomfort at present time.  Stated she just feels weak all over.  Call transferred to Flow Coordinator for scheduling of appt.  Answer Assessment - Initial Assessment Questions 1. DESCRIPTION: "Describe how you are feeling."     Intermittent weakness; feels hot, then gets weak all over 2. SEVERITY: "How bad is it?"  "Can you stand and walk?"   - MILD - Feels weak or tired, but does not interfere with work, school or normal activities   - [place] to stand and walk; weakness interferes with work, school, or normal activities   - SEVERE - Unable to stand or walk     Severe at times  3. ONSET:  "When did the weakness begin?"     Comes and goes 4. CAUSE: "What do you think is causing the weakness?"     Coming off of Betablocker 5. MEDICINES: "Have you recently  started a new medicine or had a change in the amount of a medicine?"    Decreasing dose of Propanolol 6. OTHER SYMPTOMS: "Do you have any other symptoms?" (e.g., chest pain, fever, cough, SOB, vomiting, diarrhea, bleeding, other areas of pain)     Light-headed episodes, intermittent sweating, weakness, nausea, intermittent mid chest discomfort; non-radiataing  7. PREGNANCY: "Is there any chance you are pregnant?" "When was your last menstrual period?"     LMP; finished cycle on 4/5  Protocols used: WEAKNESS (GENERALIZED) AND FATIGUE-A-AH

## 2018-11-20 ENCOUNTER — Other Ambulatory Visit: Payer: Self-pay

## 2018-11-20 ENCOUNTER — Encounter: Payer: Self-pay | Admitting: Family Medicine

## 2018-11-20 ENCOUNTER — Telehealth: Payer: Self-pay | Admitting: *Deleted

## 2018-11-20 ENCOUNTER — Ambulatory Visit (INDEPENDENT_AMBULATORY_CARE_PROVIDER_SITE_OTHER): Payer: 59 | Admitting: Family Medicine

## 2018-11-20 VITALS — BP 144/80 | HR 64 | Resp 12

## 2018-11-20 DIAGNOSIS — R002 Palpitations: Secondary | ICD-10-CM

## 2018-11-20 DIAGNOSIS — I10 Essential (primary) hypertension: Secondary | ICD-10-CM

## 2018-11-20 DIAGNOSIS — F419 Anxiety disorder, unspecified: Secondary | ICD-10-CM

## 2018-11-20 DIAGNOSIS — R0789 Other chest pain: Secondary | ICD-10-CM | POA: Diagnosis not present

## 2018-11-20 NOTE — Progress Notes (Signed)
Virtual Visit via Video Note   I connected with [name] on 11/20/18 at  8:00 AM EDT by a video enabled telemedicine application and verified that I am speaking with the correct person using two identifiers.  Location patient: home Location provider:work or home office Persons participating in the virtual visit: patient, provider  I discussed the limitations of evaluation and management by telemedicine and the availability of in person appointments. The patient expressed understanding and agreed to proceed.   HPI: She is a 38 years old female with history of hypertension, prediabetes, and anxiety among some, who is complaining about chest discomfort and palpitations. She has had these problems for a while but they seem to be more frequent now. They do not always present at the same time. Sometimes related to mild exertion but other times when she is resting. Chest "flutter" sensation, "time to time",not frequent. HR during episode a couple nights ago while in bed was 116/min.  Denies associated diaphoresis or dyspnea. Chest "tightness" is not radiated,it last a "few minutes."  She denies dysphagia,heartburn,nausea,vomiting,or abdominal pain.  She has not tried OTC medications.  HTN,she is on Lotrel 5-10 mg daily and Propranolol 20 mg am. Because bradycardia Propranolol was decreased from 40 mg bid to 20 mg am and if needed at night. Propranolol helps with episodes of acute anxiety.  HR between 54-64/min.  BP 140's/80's. Denies headache,visual changes, gross hemturia,decreased urine output,or edema.  Hx of anxiety and bipolar disorder. She has tried different meds in the past. We have tried a few medications,including Sertraline,Lexapro,and Celexa but has not tolerated well. She denies depressed mood or suicidal thoughts. Propranolol helps with "shaking" sensation caused by acute anxiety.  ROS: See pertinent positives and negatives per HPI.  Past Medical History:   Diagnosis Date  . Anxiety   . Arthritis   . Depression   . Hypertension     No past surgical history on file.  Family History  Problem Relation Age of Onset  . Arthritis Mother   . Asthma Mother   . Cancer Maternal Aunt        breast  . Depression Maternal Aunt   . Hypertension Maternal Aunt   . Diabetes Maternal Grandfather   . Heart disease Maternal Grandfather   . Hypertension Maternal Grandfather   . Stroke Maternal Grandfather     Social History   Socioeconomic History  . Marital status: Married    Spouse name: Not on file  . Number of children: Not on file  . Years of education: Not on file  . Highest education level: Not on file  Occupational History  . Not on file  Social Needs  . Financial resource strain: Not on file  . Food insecurity:    Worry: Not on file    Inability: Not on file  . Transportation needs:    Medical: Not on file    Non-medical: Not on file  Tobacco Use  . Smoking status: Former Research scientist (life sciences)  . Smokeless tobacco: Never Used  Substance and Sexual Activity  . Alcohol use: Yes    Comment: occasionally  . Drug use: No  . Sexual activity: Yes    Birth control/protection: None  Lifestyle  . Physical activity:    Days per week: Not on file    Minutes per session: Not on file  . Stress: Not on file  Relationships  . Social connections:    Talks on phone: Not on file    Gets together: Not on file  Attends religious service: Not on file    Active member of club or organization: Not on file    Attends meetings of clubs or organizations: Not on file    Relationship status: Not on file  . Intimate partner violence:    Fear of current or ex partner: Not on file    Emotionally abused: Not on file    Physically abused: Not on file    Forced sexual activity: Not on file  Other Topics Concern  . Not on file  Social History Narrative  . Not on file      Current Outpatient Medications:  .  amLODipine-benazepril (LOTREL) 5-10 MG  capsule, Take 1 capsule by mouth daily., Disp: 90 capsule, Rfl: 1 .  propranolol (INDERAL) 40 MG tablet, TAKE 20 mg Am and 40 MG BY MOUTH., Disp: 180 tablet, Rfl: 3  EXAM:  VITALS per patient if applicable:BP (!) 867/67   Pulse 64   Resp 12   GENERAL: alert, oriented, appears well and in no acute distress  HEENT: atraumatic, conjunttiva clear, no obvious abnormalities on inspection of external nose and ears  NECK: normal movements of the head and neck  LUNGS: on inspection no signs of respiratory distress, breathing rate appears normal, no obvious gross SOB, gasping or wheezing  CV: no obvious cyanosis  MS: moves all visible extremities without noticeable abnormality  PSYCH/NEURO: pleasant and cooperative, no obvious depression. Anxious, speech and thought processing grossly intact  ASSESSMENT AND PLAN:  Discussed the following assessment and plan:  Heart palpitations - Plan: Ambulatory referral to Cardiology, Basic metabolic panel, TSH Palpitation could be related to anxiety. Because she has had these concern for a while now and very concerned about "heart problems", cardiology referral was placed. For now no changes in propranolol 20 mg daily.  Chest discomfort Possible etiologies discussed. It seems to be a recurrent problem and very concerned about "heart problem." Explained that the likelihood of this CP be cardiac is low but never zero. ? Anxiety,GERD,musculoskeletal among some to consider. Clearly instructed about warning signs. Cardiology referral placed.  Hypertension, essential, benign BP today mildly elevated. For now no changes in antihypertensive meds. Recommend monitoring BP daily. Continue low-salt diet. Follow-up in 4 weeks.  Anxiety disorder Propranolol also helps with anxiety, if this medication needs to be discontinued in the future due to bradycardia she would like to have medication for anxiety. We have tried some SSRIs and SNRI, she has not  tolerated well. In the past I have recommended to stop it with psychiatrist and I am recommeding it again today.   Return in about 4 weeks (around 12/18/2018) for HTN.   I discussed the assessment and treatment plan with the patient. The patient was provided an opportunity to ask questions and all were answered. The patient agreed with the plan and demonstrated an understanding of the instructions.   The patient was advised to call back or seek an in-person evaluation if the symptoms worsen or if the condition fails to improve as anticipated.  Return in about 4 weeks (around 12/18/2018) for HTN.    Sue Martinique, MD

## 2018-11-20 NOTE — Telephone Encounter (Signed)
Questions for Screening COVID-19  Symptom onset: N/A   Travel or Contacts: No   During this illness, did/does the patient experience any of the following symptoms? Fever >100.46F []   Yes [x]   No []   Unknown Subjective fever (felt feverish) []   Yes [x]   No []   Unknown Chills []   Yes [x]   No []   Unknown Muscle aches (myalgia) []   Yes [x]   No []   Unknown Runny nose (rhinorrhea) []   Yes [x]   No []   Unknown Sore throat []   Yes [x]   No []   Unknown Cough (new onset or worsening of chronic cough) []   Yes [x]   No []   Unknown Shortness of breath (dyspnea) []   Yes [x]   No []   Unknown Nausea or vomiting []   Yes []   No []   Unknown Headache []   Yes [x]   No []   Unknown Abdominal pain  []   Yes [x]   No []   Unknown Diarrhea (?3 loose/looser than normal stools/24hr period) []   Yes [x]   No []   Unknown Other, specify:  Patient risk factors: Smoker? []   Current [x]   Former []   Never If female, currently pregnant? []   Yes [x]   No  Patient Active Problem List   Diagnosis Date Noted  . Prediabetes 08/15/2018  . Anxiety disorder 02/06/2018  . Low back pain radiating to right lower extremity 07/06/2017  . Upper back pain, chronic 07/06/2017  . Hypertension, essential, benign 03/11/2017  . Bipolar disorder with depression ([place]) 03/11/2017  . Class 1 obesity with body mass index (BMI) of 34.0 to 34.9 in adult 03/11/2017    Plan:  []   Patient reports she was tested for COVID-19 a couple weeks ago and reports results were negative.   Note: Referral to telemedicine is an appropriate alternative disposition for higher risk but stable. [facility]/e-Visit: [phone].

## 2018-11-20 NOTE — Assessment & Plan Note (Addendum)
BP today mildly elevated. For now no changes in antihypertensive meds. Recommend monitoring BP daily. Continue low-salt diet. Follow-up in 4 weeks.

## 2018-11-20 NOTE — Telephone Encounter (Signed)
Copied from [place] [phone]. Topic: Appointment Scheduling - Scheduling Inquiry for Clinic >> Nov 17, 2018  7:56 AM Rayann Heman wrote: Reason for CRM: pt called and stated that she would like a call back regarding appointment that she has Monday  11/20/18. Pt wants to make sure what kind of appointment that she has. Please advise

## 2018-11-20 NOTE — Assessment & Plan Note (Signed)
Propranolol also helps with anxiety, if this medication needs to be discontinued in the future due to bradycardia she would like to have medication for anxiety. We have tried some SSRIs and SNRI, she has not tolerated well. In the past I have recommended to stop it with psychiatrist and I am recommeding it again today.

## 2018-11-21 ENCOUNTER — Other Ambulatory Visit (INDEPENDENT_AMBULATORY_CARE_PROVIDER_SITE_OTHER): Payer: 59

## 2018-11-21 ENCOUNTER — Other Ambulatory Visit: Payer: Self-pay

## 2018-11-21 ENCOUNTER — Encounter: Payer: Self-pay | Admitting: Family Medicine

## 2018-11-21 DIAGNOSIS — R002 Palpitations: Secondary | ICD-10-CM | POA: Diagnosis not present

## 2018-11-21 DIAGNOSIS — I10 Essential (primary) hypertension: Secondary | ICD-10-CM

## 2018-11-21 LAB — BASIC METABOLIC PANEL
BUN: 20 mg/dL (ref 6–23)
CO2: 26 mEq/L (ref 19–32)
Calcium: 8.8 mg/dL (ref 8.4–10.5)
Chloride: 101 mEq/L (ref 96–112)
Creatinine, Ser: 0.92 mg/dL (ref 0.40–1.20)
GFR: 67.64 mL/min (ref >60.00)
Glucose, Bld: 146 mg/dL — ABNORMAL HIGH (ref 70–99)
Potassium: 4.5 mEq/L (ref 3.5–5.1)
Sodium: 136 mEq/L (ref 135–145)

## 2018-11-21 LAB — TSH: TSH: 2.51 u[IU]/mL (ref 0.35–4.50)

## 2018-11-29 ENCOUNTER — Telehealth: Payer: Self-pay | Admitting: Cardiovascular Disease

## 2018-11-29 NOTE — Telephone Encounter (Signed)
Spoke to pt who stated she is doing well and agreed to reschedule appointment to 04/05/19 at 8:40 AM with Dr. Claiborne Billings.

## 2018-11-30 ENCOUNTER — Ambulatory Visit: Payer: 59 | Admitting: Cardiovascular Disease

## 2019-02-02 ENCOUNTER — Telehealth: Payer: Self-pay | Admitting: *Deleted

## 2019-02-02 NOTE — Telephone Encounter (Signed)
Copied from [place] [id number]. Topic: General - Other >> Feb 02, 2019  7:30 AM Carolyn Stare wrote: Pt req an appt said she is having anxiety issues

## 2019-02-02 NOTE — Telephone Encounter (Signed)
Patient scheduled for Tuesday at 7 am. Nothing further needed at this time.

## 2019-02-06 ENCOUNTER — Ambulatory Visit: Payer: 59 | Admitting: Family Medicine

## 2019-04-03 ENCOUNTER — Telehealth: Payer: Self-pay | Admitting: Family Medicine

## 2019-04-03 NOTE — Telephone Encounter (Signed)
Pt states that FMLA paper work should be done by Friday and to call her she will pick up.  Informed of Paperwork turnaround time

## 2019-04-03 NOTE — Telephone Encounter (Signed)
FMLA forms to be filled out- placed in dr's folder.  Mail to:  [address] [place], [place] [address]

## 2019-04-03 NOTE — Telephone Encounter (Signed)
Form given to provider for completion. LVM for patient to return call to office concerning forms.

## 2019-04-04 NOTE — Telephone Encounter (Signed)
Patient scheduled for virtual visit for on 04/06/19.

## 2019-04-05 ENCOUNTER — Ambulatory Visit: Payer: 59 | Admitting: Cardiovascular Disease

## 2019-04-06 ENCOUNTER — Telehealth (INDEPENDENT_AMBULATORY_CARE_PROVIDER_SITE_OTHER): Payer: 59 | Admitting: Family Medicine

## 2019-04-06 ENCOUNTER — Other Ambulatory Visit: Payer: Self-pay

## 2019-04-06 VITALS — Ht 67.0 in

## 2019-04-06 DIAGNOSIS — R42 Dizziness and giddiness: Secondary | ICD-10-CM

## 2019-04-06 DIAGNOSIS — F419 Anxiety disorder, unspecified: Secondary | ICD-10-CM | POA: Diagnosis not present

## 2019-04-06 DIAGNOSIS — I10 Essential (primary) hypertension: Secondary | ICD-10-CM

## 2019-04-06 MED ORDER — PROPRANOLOL HCL 40 MG PO TABS: 20.0000 mg | ORAL_TABLET | Freq: Every day | ORAL | 3 refills | Status: DC

## 2019-04-06 MED ORDER — MECLIZINE HCL 25 MG PO TABS: 25.0000 mg | ORAL_TABLET | Freq: Two times a day (BID) | ORAL | 0 refills | Status: DC | PRN

## 2019-04-06 NOTE — Progress Notes (Signed)
Virtual Visit via Video Note   I connected with Sue Jackson on 04/06/19 by a video enabled telemedicine application and verified that I am speaking with the correct person using two identifiers.  Location patient: home Location provider:office Persons participating in the virtual visit: patient, provider  I discussed the limitations of evaluation and management by telemedicine and the availability of in person appointments. The patient expressed understanding and agreed to proceed.   HPI: Sue Jackson is a 38 yo female with Hx of depression,anxiety,and HTN who is requesting FMLA,so she can leave or miss work if she does "not feel well." She has already missed some days and has left 2 hours earlier because " feeling bad." "Sometimes I call out" because "I do not feel good."  C/O lightheaded episodes that happen "once in the blue moon." She describes dizziness as "little spinning", sometimes associated with nausea. Intermittent tinnitus that has happened for awhile, not associated with hearing loss.  States that she has a very stressful job at the [employer] goes up when she is at work, when she is at home BP is "ggod."  Hypertension, currently she is on Lotrel 5-10 mg daily. She also takes propranolol 20 mg in the morning to help with anxiety. Denies severe/frequent headache, visual changes, chest pain, dyspnea,focal weakness, or edema.  I have recommended to establish with psychiatrist, she has not done so. She is planning on arranging appt with  Psychologist.   ROS: See pertinent positives and negatives per HPI.  Past Medical History:  Diagnosis Date  . Anxiety   . Arthritis   . Depression   . Hypertension     No past surgical history on file.  Family History  Problem Relation Age of Onset  . Arthritis Mother   . Asthma Mother   . Cancer Maternal Aunt        breast  . Depression Maternal Aunt   . Hypertension Maternal Aunt   . Diabetes Maternal Grandfather   .  Heart disease Maternal Grandfather   . Hypertension Maternal Grandfather   . Stroke Maternal Grandfather     Social History   Socioeconomic History  . Marital status: Married    Spouse name: Not on file  . Number of children: Not on file  . Years of education: Not on file  . Highest education level: Not on file  Occupational History  . Not on file  Social Needs  . Financial resource strain: Not on file  . Food insecurity    Worry: Not on file    Inability: Not on file  . Transportation needs    Medical: Not on file    Non-medical: Not on file  Tobacco Use  . Smoking status: Former Research scientist (life sciences)  . Smokeless tobacco: Never Used  Substance and Sexual Activity  . Alcohol use: Yes    Comment: occasionally  . Drug use: No  . Sexual activity: Yes    Birth control/protection: None  Lifestyle  . Physical activity    Days per week: Not on file    Minutes per session: Not on file  . Stress: Not on file  Relationships  . Social Herbalist on phone: Not on file    Gets together: Not on file    Attends religious service: Not on file    Active member of club or organization: Not on file    Attends meetings of clubs or organizations: Not on file    Relationship status: Not on file  .  Intimate partner violence    Fear of current or ex partner: Not on file    Emotionally abused: Not on file    Physically abused: Not on file    Forced sexual activity: Not on file  Other Topics Concern  . Not on file  Social History Narrative  . Not on file    Current Outpatient Medications:  .  amLODipine-benazepril (LOTREL) 5-10 MG capsule, Take 1 capsule by mouth daily., Disp: 90 capsule, Rfl: 1 .  meclizine (ANTIVERT) 25 MG tablet, Take 1 tablet (25 mg total) by mouth 2 (two) times daily as needed for dizziness., Disp: 30 tablet, Rfl: 0 .  propranolol (INDERAL) 40 MG tablet, Take 0.5 tablets (20 mg total) by mouth daily. TAKE 20 mg Am and 40 MG BY MOUTH., Disp: 180 tablet, Rfl:  3  EXAM:  VITALS per patient if applicable:Ht 5\' 7"  (1.702 m)   BMI 34.50 kg/m   GENERAL: alert, oriented, appears well and in no acute distress  HEENT: atraumatic, conjunttiva clear, no obvious abnormalities on inspection.  NECK: normal movements of the head and neck  LUNGS: on inspection no signs of respiratory distress, breathing rate appears normal, no obvious gross SOB, gasping or wheezing  CV: no obvious cyanosis  Sue: moves all visible extremities without noticeable abnormality  PSYCH/NEURO: pleasant and cooperative, no obvious depression,+ anxious.Speech and thought processing grossly intact  ASSESSMENT AND PLAN:  Discussed the following assessment and plan:  Lightheaded - Plan: meclizine (ANTIVERT) 25 MG tablet We discussed possible etiologies. Given Hx suggest vertigo. ? Anxiety. Fall precautions  Hypertension, essential, benign - Plan: propranolol (INDERAL) 40 MG tablet Reporting BP adequately controlled when she is not under stress. No changes in current management. Continue low salt diet. F/U in 3-4 months.  Anxiety disorder, unspecified type - Plan: propranolol (INDERAL) 40 MG tablet Continue Propranolol 20 mg daily. Problem is not well controlled. She has not tolerated  Medications I have recommended.  FMLA will be completed, explained that I have several others I have to fill out first,so it may take a week or so. 4 hours per month for 3 months.  I discussed the assessment and treatment plan with the patient. The patient was provided an opportunity to ask questions and all were answered. The patient agreed with the plan and demonstrated an understanding of the instructions.   The patient was advised to call back or seek an in-person evaluation if the symptoms worsen or if the condition fails to improve as anticipated.  F/U in 3-4 months.    Shatiqua Heroux Martinique, MD

## 2019-04-08 ENCOUNTER — Encounter: Payer: Self-pay | Admitting: Family Medicine

## 2019-04-11 ENCOUNTER — Encounter: Payer: Self-pay | Admitting: *Deleted

## 2019-04-11 DIAGNOSIS — Z0279 Encounter for issue of other medical certificate: Secondary | ICD-10-CM

## 2019-04-11 NOTE — Telephone Encounter (Signed)
Forms completed and mailed to patient as requested. Patient informed of $29 fee for form completion.

## 2019-04-17 ENCOUNTER — Other Ambulatory Visit: Payer: Self-pay

## 2019-04-17 DIAGNOSIS — Z20822 Contact with and (suspected) exposure to covid-19: Secondary | ICD-10-CM

## 2019-04-19 ENCOUNTER — Telehealth: Payer: Self-pay | Admitting: Family Medicine

## 2019-04-19 LAB — NOVEL CORONAVIRUS, NAA: SARS-CoV-2, NAA: NOT DETECTED

## 2019-04-19 NOTE — Telephone Encounter (Signed)
LMVM for the patient to contact the office to schedule a 3-4 months follow up appointment from her virtual appointment with Dr. Martinique on 04/06/2019.

## 2019-04-25 ENCOUNTER — Other Ambulatory Visit: Payer: Self-pay | Admitting: Family Medicine

## 2019-04-25 DIAGNOSIS — I10 Essential (primary) hypertension: Secondary | ICD-10-CM

## 2019-05-01 ENCOUNTER — Ambulatory Visit: Payer: 59 | Admitting: Family Medicine

## 2019-05-01 ENCOUNTER — Telehealth: Payer: Self-pay | Admitting: *Deleted

## 2019-05-01 NOTE — Telephone Encounter (Signed)
Pt is returning the office call, advised pt per message from [place]. Pt says that she is unsure of the address that she provided on the form. ALSO, pt says that she is unsure of the details of the form, how many days is she approved? Etc,   Pt is requesting a call back, if no answer she would like to have a detailed message left.

## 2019-05-01 NOTE — Telephone Encounter (Signed)
Left message for patient to return call to office concerning forms. Forms were mailed to patient per Request form on 04/11/2019 to address given on form.   Copied from [place] [id number]. Topic: General - Other >> Apr 24, 2019  3:02 PM Carolyn Stare wrote: Pt call to follow up on  FMLA paper work , she would like to know if the paperwork is ready for pick up

## 2019-05-02 NOTE — Telephone Encounter (Signed)
Spoke with patient and informed her that forms were placed with outgoing mail on 04/11/2019 to address providing on request for completion form. Patient confirmed that address was correct. Patient informed that if she provided a fax number, forms could be faxed to HR, patient stated that she did not know the fax number and would swing by the office on 05/03/2019 to pick up copy of forms if she doesn't receive the other one in the mail. Forms placed at front desk for pick up.

## 2019-05-02 NOTE — Telephone Encounter (Signed)
Forms faxed to [employer] at [phone] as requested per patient.

## 2019-05-02 NOTE — Telephone Encounter (Signed)
Pt callilng back. States that she now has the fax number to HR and would like her FMLA paperwork faxed to them. Fax number: [phone] Attn:  [employer]

## 2019-05-02 NOTE — Telephone Encounter (Signed)
Pt picked up copies of forms and paid $29.00

## 2019-06-25 ENCOUNTER — Telehealth: Payer: Self-pay | Admitting: *Deleted

## 2019-06-25 NOTE — Telephone Encounter (Signed)
Copied from [place] [phone]. Topic: General - Other >> Jun 25, 2019  9:31 AM Yvette Rack wrote: Reason for CRM: Pt stated she works 6 days per week for the [employer] and now they are requesting that she works 7 days per week. Pt stated she has problems with her blood pressure and she needs a letter from her doctor stating that she needs at least 1 day off due to her health condition. Pt requests call back to discuss getting a letter documenting her health condition so she is not forced to work 7 days per week

## 2019-06-26 NOTE — Telephone Encounter (Signed)
Pt calling back again to see if this note is something that she will be able to get and pt is needing this very quickly.

## 2019-06-26 NOTE — Telephone Encounter (Signed)
I can not provide letter stating that she needs a "day off" because HTN. Thanks, BJ

## 2019-07-10 ENCOUNTER — Encounter: Payer: Self-pay | Admitting: Family Medicine

## 2019-07-10 ENCOUNTER — Telehealth (INDEPENDENT_AMBULATORY_CARE_PROVIDER_SITE_OTHER): Payer: 59 | Admitting: Family Medicine

## 2019-07-10 ENCOUNTER — Other Ambulatory Visit: Payer: Self-pay

## 2019-07-10 VITALS — BP 130/70 | Ht 67.0 in

## 2019-07-10 DIAGNOSIS — R05 Cough: Secondary | ICD-10-CM | POA: Diagnosis not present

## 2019-07-10 DIAGNOSIS — F419 Anxiety disorder, unspecified: Secondary | ICD-10-CM | POA: Diagnosis not present

## 2019-07-10 DIAGNOSIS — I10 Essential (primary) hypertension: Secondary | ICD-10-CM | POA: Diagnosis not present

## 2019-07-10 DIAGNOSIS — R059 Cough, unspecified: Secondary | ICD-10-CM

## 2019-07-10 MED ORDER — FLUTICASONE PROPIONATE 50 MCG/ACT NA SUSP: 1.0000 | Freq: Two times a day (BID) | NASAL | 2 refills | Status: DC

## 2019-07-10 MED ORDER — BENZONATATE 100 MG PO CAPS: 200.0000 mg | ORAL_CAPSULE | Freq: Two times a day (BID) | ORAL | 0 refills | Status: AC | PRN

## 2019-07-10 NOTE — Progress Notes (Signed)
Virtual Visit via Video Note   I connected with Sue Jackson on 07/10/19 by a video enabled telemedicine application and verified that I am speaking with the correct person using two identifiers.  Location patient: home Location provider:work office Persons participating in the virtual visit: patient, provider  I discussed the limitations of evaluation and management by telemedicine and the availability of in person appointments. The patient expressed understanding and agreed to proceed.   HPI: Sue Jackson is a 38 yo female with Hx of anxiety,bipolar disorder,and obesity who is c/o 2 weeks of productive cough with "little" clear sputum. She denies hemoptysis. She states that she had a cold 2 weeks ago, all symptoms resolved except for cough. Denies fever, chills, sore throat, dyspnea, wheezing, abdominal pain, changes in bowel habits, nausea, vomiting, or a skin rash. She denies changes in the smell or taste. + Nasal congestion, rhinorrhea, and postnasal drainage.  Negative for sick contacts or recent travel. She has history of allergies. She took Coricidin.  Denies severe/frequent headache, visual changes, chest pain, dyspnea, palpitation, claudication, focal weakness, or edema.  Hypertension: She reporting high BPs aggravated by stress at work. 150s/90s but after she relaxes BP goes down to 120s-130/60-70s. Currently she is on Lotrel 5-10 mg daily. She also takes propranolol 40 mg 1/2 tablet daily, this medication also helps with anxiety.  Lab Results  Component Value Date   CREATININE 0.92 11/21/2018   BUN 20 11/21/2018   NA 136 11/21/2018   K 4.5 11/21/2018   CL 101 11/21/2018   CO2 26 11/21/2018   She is complaining of feeling tired "physically sick" due to longer hours at work. According to patient, she is working 12 hours 7 days/week, she has not had a day off in a while.  She is requesting a letter stating that she needs at least 1 day off per week. Anxiety is  aggravated by stress at work. She has not tolerated different SSRIs and SNRIs, some have caused suicidal thoughts. She has been recommended in the past establish with psychiatrist and psychotherapist, she has not done so. Negative for suicidal thoughts.  ROS: See pertinent positives and negatives per HPI.  Past Medical History:  Diagnosis Date  . Anxiety   . Arthritis   . Depression   . Hypertension     No past surgical history on file.  Family History  Problem Relation Age of Onset  . Arthritis Mother   . Asthma Mother   . Cancer Maternal Aunt        breast  . Depression Maternal Aunt   . Hypertension Maternal Aunt   . Diabetes Maternal Grandfather   . Heart disease Maternal Grandfather   . Hypertension Maternal Grandfather   . Stroke Maternal Grandfather     Social History   Socioeconomic History  . Marital status: Married    Spouse name: Not on file  . Number of children: Not on file  . Years of education: Not on file  . Highest education level: Not on file  Occupational History  . Not on file  Social Needs  . Financial resource strain: Not on file  . Food insecurity    Worry: Not on file    Inability: Not on file  . Transportation needs    Medical: Not on file    Non-medical: Not on file  Tobacco Use  . Smoking status: Former Research scientist (life sciences)  . Smokeless tobacco: Never Used  Substance and Sexual Activity  . Alcohol use: Yes  Comment: occasionally  . Drug use: No  . Sexual activity: Yes    Birth control/protection: None  Lifestyle  . Physical activity    Days per week: Not on file    Minutes per session: Not on file  . Stress: Not on file  Relationships  . Social Herbalist on phone: Not on file    Gets together: Not on file    Attends religious service: Not on file    Active member of club or organization: Not on file    Attends meetings of clubs or organizations: Not on file    Relationship status: Not on file  . Intimate partner violence     Fear of current or ex partner: Not on file    Emotionally abused: Not on file    Physically abused: Not on file    Forced sexual activity: Not on file  Other Topics Concern  . Not on file  Social History Narrative  . Not on file     Current Outpatient Medications:  .  amLODipine-benazepril (LOTREL) 5-10 MG capsule, TAKE 1 CAPSULE BY MOUTH EVERY DAY, Disp: 90 capsule, Rfl: 1 .  benzonatate (TESSALON) 100 MG capsule, Take 2 capsules (200 mg total) by mouth 2 (two) times daily as needed for up to 10 days., Disp: 40 capsule, Rfl: 0 .  fluticasone (FLONASE) 50 MCG/ACT nasal spray, Place 1 spray into both nostrils 2 (two) times daily., Disp: 16 g, Rfl: 2 .  meclizine (ANTIVERT) 25 MG tablet, Take 1 tablet (25 mg total) by mouth 2 (two) times daily as needed for dizziness., Disp: 30 tablet, Rfl: 0 .  propranolol (INDERAL) 40 MG tablet, Take 0.5 tablets (20 mg total) by mouth daily. TAKE 20 mg Am and 40 MG BY MOUTH., Disp: 180 tablet, Rfl: 3  EXAM:  VITALS per patient if applicable:BP [id number]   Ht 5\' 7"  (1.702 m)   LMP 06/24/2019   BMI 34.50 kg/m   GENERAL: alert, oriented, appears well and in no acute distress  HEENT: atraumatic, conjunctiva clear, no obvious abnormalities on inspection. Nasal congestion and rhinorrhea.  NECK: normal movements of the head and neck  LUNGS: on inspection no signs of respiratory distress, breathing rate appears normal, no obvious gross SOB, gasping or wheezing. Coughs a couple times during visit.  CV: no obvious cyanosis  Sue: moves all visible extremities without noticeable abnormality  PSYCH/NEURO: pleasant and cooperative, no obvious depression but she is anxious.  ASSESSMENT AND PLAN:  Discussed the following assessment and plan:  Cough - Plan: benzonatate (TESSALON) 100 MG capsule Possible etiologies discussed. Hx suggest residual problem after URI. I do not think imaging is needed at this time. Instructed about warning signs.    Hypertension, essential, benign In general it seems like BP is adequately controlled. Bring BP monitor to the clinic next visit. Monitor BP regularly with appropriate technique. Continue low salt diet.  Anxiety disorder, unspecified type Problem is not well controlled. Recommend again to establish with psychologies and psychotherapy. No changes in Propranolol.    I discussed the assessment and treatment plan with the patient.She was provided an opportunity to ask questions and all were answered. She agreed with the plan and demonstrated an understanding of the instructions.    Return in about 4 months (around 11/08/2019) for HTN and anxiety.     [place], MD

## 2019-07-26 NOTE — Progress Notes (Signed)
Patient is scheduled for 11/09/2019 at 8 AM

## 2019-08-08 ENCOUNTER — Telehealth (INDEPENDENT_AMBULATORY_CARE_PROVIDER_SITE_OTHER): Payer: 59 | Admitting: Family Medicine

## 2019-08-08 ENCOUNTER — Telehealth: Payer: Self-pay | Admitting: Family Medicine

## 2019-08-08 ENCOUNTER — Encounter: Payer: Self-pay | Admitting: Family Medicine

## 2019-08-08 ENCOUNTER — Other Ambulatory Visit: Payer: Self-pay | Admitting: Family Medicine

## 2019-08-08 VITALS — Ht 67.0 in

## 2019-08-08 DIAGNOSIS — M545 Low back pain, unspecified: Secondary | ICD-10-CM

## 2019-08-08 DIAGNOSIS — I10 Essential (primary) hypertension: Secondary | ICD-10-CM

## 2019-08-08 DIAGNOSIS — F419 Anxiety disorder, unspecified: Secondary | ICD-10-CM

## 2019-08-08 MED ORDER — TIZANIDINE HCL 4 MG PO TABS: 4.0000 mg | ORAL_TABLET | Freq: Two times a day (BID) | ORAL | 0 refills | Status: DC | PRN

## 2019-08-08 MED ORDER — HYDROCODONE-ACETAMINOPHEN 5-325 MG PO TABS: 1.0000 | ORAL_TABLET | Freq: Every evening | ORAL | 0 refills | Status: AC | PRN

## 2019-08-08 MED ORDER — HYDROCODONE-ACETAMINOPHEN 5-325 MG PO TABS: 1.0000 | ORAL_TABLET | Freq: Every evening | ORAL | 0 refills | Status: DC | PRN

## 2019-08-08 NOTE — Telephone Encounter (Signed)
Spoke with pt. Appointment scheduled for 4:30 this afternoon for a virtual appt with pcp.

## 2019-08-08 NOTE — Progress Notes (Signed)
Virtual Visit via Video Note   I connected with Sue Jackson on 08/08/19 by a video enabled telemedicine application and verified that I am speaking with the correct person using two identifiers.  Location patient: work. Location provider:work office Persons participating in the virtual visit: patient, provider  I discussed the limitations of evaluation and management by telemedicine and the availability of in person appointments. The patient expressed understanding and agreed to proceed.   HPI: Sue Jackson is a 38 years old female with history of hypertension, anxiety, and chronic back pain who is complaining about 3 weeks of constant left-sided back pain. Occasionally pain is radiated to left buttocks. Negative for lower extremity numbness, tingling, bladder/bowel dysfunction, or saddle anesthesia.  About a week ago she went to see a chiropractor, treatment has helped some but is still having pain. Pain was "terrible", 10/10. Now pain is 4-5/10. She requesting prescription for hydrocodone-acetaminophen.  She took some she had left from old prescription and "it really helped", she is taking medication at bedtime.   She denies any recent injury or unusual level of activity.  Pain is exacerbated by movement, and she could not bend down due to pain. Alleviated by rest. No rash or edema on area, fever, chills, or abnormal wt loss.  ROS: See pertinent positives and negatives per HPI.  Past Medical History:  Diagnosis Date  . Anxiety   . Arthritis   . Depression   . Hypertension     No past surgical history on file.  Family History  Problem Relation Age of Onset  . Arthritis Mother   . Asthma Mother   . Cancer Maternal Aunt        breast  . Depression Maternal Aunt   . Hypertension Maternal Aunt   . Diabetes Maternal Grandfather   . Heart disease Maternal Grandfather   . Hypertension Maternal Grandfather   . Stroke Maternal Grandfather     Social History    Socioeconomic History  . Marital status: Married    Spouse name: Not on file  . Number of children: Not on file  . Years of education: Not on file  . Highest education level: Not on file  Occupational History  . Not on file  Tobacco Use  . Smoking status: Former Research scientist (life sciences)  . Smokeless tobacco: Never Used  Substance and Sexual Activity  . Alcohol use: Yes    Comment: occasionally  . Drug use: No  . Sexual activity: Yes    Birth control/protection: None  Other Topics Concern  . Not on file  Social History Narrative  . Not on file   Social Determinants of Health   Financial Resource Strain:   . Difficulty of Paying Living Expenses: Not on file  Food Insecurity:   . Worried About Charity fundraiser in the Last Year: Not on file  . Ran Out of Food in the Last Year: Not on file  Transportation Needs:   . Lack of Transportation (Medical): Not on file  . Lack of Transportation (Non-Medical): Not on file  Physical Activity:   . Days of Exercise per Week: Not on file  . Minutes of Exercise per Session: Not on file  Stress:   . Feeling of Stress : Not on file  Social Connections:   . Frequency of Communication with Friends and Family: Not on file  . Frequency of Social Gatherings with Friends and Family: Not on file  . Attends Religious Services: Not on file  . Active Member  of Clubs or Organizations: Not on file  . Attends Archivist Meetings: Not on file  . Marital Status: Not on file  Intimate Partner Violence:   . Fear of Current or Ex-Partner: Not on file  . Emotionally Abused: Not on file  . Physically Abused: Not on file  . Sexually Abused: Not on file    Current Outpatient Medications:  .  amLODipine-benazepril (LOTREL) 5-10 MG capsule, TAKE 1 CAPSULE BY MOUTH EVERY DAY, Disp: 90 capsule, Rfl: 1 .  fluticasone (FLONASE) 50 MCG/ACT nasal spray, Place 1 spray into both nostrils 2 (two) times daily., Disp: 16 g, Rfl: 2 .  meclizine (ANTIVERT) 25 MG tablet,  Take 1 tablet (25 mg total) by mouth 2 (two) times daily as needed for dizziness., Disp: 30 tablet, Rfl: 0 .  propranolol (INDERAL) 40 MG tablet, TAKE 1 TABLET (40 MG) BY MOUTH 2 TIMES DAILY., Disp: 180 tablet, Rfl: 3 .  HYDROcodone-acetaminophen (NORCO/VICODIN) 5-325 MG tablet, Take 1 tablet by mouth at bedtime as needed for up to 7 days for moderate pain., Disp: 7 tablet, Rfl: 0 .  tiZANidine (ZANAFLEX) 4 MG tablet, Take 1 tablet (4 mg total) by mouth 2 (two) times daily as needed for muscle spasms., Disp: 30 tablet, Rfl: 0  EXAM:  VITALS per patient if applicable:Ht 5\' 7"  (1.702 m)   BMI 34.50 kg/m   GENERAL: alert, oriented, appears well and in no acute distress  HEENT: atraumatic, conjunttiva clear, no obvious abnormalities on inspection.  LUNGS: on inspection no signs of respiratory distress, breathing rate appears normal, no obvious gross SOB, gasping or wheezing  CV: no obvious cyanosis  Sue: moves all visible extremities without noticeable abnormality  PSYCH/NEURO: pleasant and cooperative, no obvious depression or anxiety, speech and thought processing grossly intact  ASSESSMENT AND PLAN:  Discussed the following assessment and plan:  Left low back pain, unspecified chronicity, unspecified whether sciatica present - Plan: tiZANidine (ZANAFLEX) 4 MG tablet, HYDROcodone-acetaminophen (NORCO/VICODIN) 5-325 MG tablet, DISCONTINUED: HYDROcodone-acetaminophen (NORCO/VICODIN) 5-325 MG tablet   Problem is improving. Continue following with chiropractor. We discussed current recommendations in regard to opioid prescriptions, as well as side effects. Continue hydrocodone-acetaminophen 5-325 mg at bedtime as needed. I also recommend Zanaflex 2 to 4 mg twice daily as needed.    I discussed the assessment and treatment plan with the patient. She was provided an opportunity to ask questions and all were answered. She agreed with the plan and demonstrated an understanding of the  instructions.   The patient was advised to call back or seek an in-person evaluation if the symptoms worsen or if the condition fails to improve as anticipated.  Return if symptoms worsen or fail to improve.    Makinzi Prieur Martinique, MD

## 2019-08-08 NOTE — Telephone Encounter (Signed)
Message Routed to PCP CMA 

## 2019-08-08 NOTE — Telephone Encounter (Signed)
Copied from [place] [id number]. Topic: General - Other >> Aug 08, 2019  9:44 AM Keene Breath wrote: Reason for CRM: Patient called to request some pain medication for her back pain.  Please advise and call to discuss at [phone]

## 2019-08-10 DIAGNOSIS — R519 Headache, unspecified: Secondary | ICD-10-CM

## 2019-08-10 DIAGNOSIS — C801 Malignant (primary) neoplasm, unspecified: Secondary | ICD-10-CM

## 2019-08-10 HISTORY — DX: Malignant (primary) neoplasm, unspecified: C80.1

## 2019-08-10 HISTORY — DX: Headache, unspecified: R51.9

## 2019-08-16 ENCOUNTER — Telehealth: Payer: Self-pay | Admitting: Family Medicine

## 2019-08-16 NOTE — Telephone Encounter (Signed)
Pt is seeing chiropractor for back pain/ Pts provider advised her to call PCP for pain medication/ Pt needs a refill for HYDROcodone-acetaminophen (NORCO/VICODIN) 5-325 MG tablet  Sent to   [id number] [address], [place] Phone:  [phone]  Fax:  [phone]     Please advise

## 2019-08-16 NOTE — Telephone Encounter (Signed)
Message Routed to PCP CMA 

## 2019-08-17 NOTE — Telephone Encounter (Signed)
I agreed with sending Rx last visit but it cannot be refilled due to current regulations for controlled meds prescription. If pain is still severe she may need to have ortho evaluation arranged.  Thanks, BJ

## 2019-08-31 ENCOUNTER — Other Ambulatory Visit: Payer: 59

## 2019-09-22 ENCOUNTER — Encounter (HOSPITAL_COMMUNITY): Payer: Self-pay | Admitting: Emergency Medicine

## 2019-09-22 ENCOUNTER — Emergency Department (HOSPITAL_COMMUNITY)
Admission: EM | Admit: 2019-09-22 | Discharge: 2019-09-22 | Disposition: A | Discharge status: Home / Self Care | Payer: 59 | Attending: Emergency Medicine | Admitting: Emergency Medicine

## 2019-09-22 ENCOUNTER — Other Ambulatory Visit: Payer: Self-pay

## 2019-09-22 DIAGNOSIS — J111 Influenza due to unidentified influenza virus with other respiratory manifestations: Secondary | ICD-10-CM

## 2019-09-22 DIAGNOSIS — I10 Essential (primary) hypertension: Secondary | ICD-10-CM | POA: Diagnosis not present

## 2019-09-22 DIAGNOSIS — R110 Nausea: Secondary | ICD-10-CM | POA: Diagnosis not present

## 2019-09-22 DIAGNOSIS — Z79899 Other long term (current) drug therapy: Secondary | ICD-10-CM | POA: Diagnosis not present

## 2019-09-22 DIAGNOSIS — R438 Other disturbances of smell and taste: Secondary | ICD-10-CM | POA: Diagnosis not present

## 2019-09-22 DIAGNOSIS — R5383 Other fatigue: Secondary | ICD-10-CM | POA: Diagnosis present

## 2019-09-22 DIAGNOSIS — F319 Bipolar disorder, unspecified: Secondary | ICD-10-CM | POA: Diagnosis not present

## 2019-09-22 DIAGNOSIS — Z87891 Personal history of nicotine dependence: Secondary | ICD-10-CM | POA: Diagnosis not present

## 2019-09-22 LAB — COMPREHENSIVE METABOLIC PANEL
ALT: 38 U/L (ref 0–44)
AST: 21 U/L (ref 15–41)
Albumin: 4 g/dL (ref 3.5–5.0)
Alkaline Phosphatase: 66 U/L (ref 38–126)
Anion gap: 12 (ref 5–15)
BUN: 20 mg/dL (ref 6–20)
CO2: 23 mmol/L (ref 22–32)
Calcium: 9.1 mg/dL (ref 8.9–10.3)
Chloride: 100 mmol/L (ref 98–111)
Creatinine, Ser: 0.92 mg/dL (ref 0.44–1.00)
GFR calc Af Amer: >60 mL/min (ref >60)
GFR calc non Af Amer: >60 mL/min (ref >60)
Glucose, Bld: 107 mg/dL — ABNORMAL HIGH (ref 70–99)
Potassium: 4 mmol/L (ref 3.5–5.1)
Sodium: 135 mmol/L (ref 135–145)
Total Bilirubin: 0.7 mg/dL (ref 0.3–1.2)
Total Protein: 7.5 g/dL (ref 6.5–8.1)

## 2019-09-22 LAB — URINALYSIS, ROUTINE W REFLEX MICROSCOPIC
Bilirubin Urine: NEGATIVE
Glucose, UA: NEGATIVE mg/dL
Hgb urine dipstick: NEGATIVE
Ketones, ur: NEGATIVE mg/dL
Leukocytes,Ua: NEGATIVE
Nitrite: NEGATIVE
Protein, ur: NEGATIVE mg/dL
Specific Gravity, Urine: 1.006 (ref 1.005–1.030)
pH: 6 (ref 5.0–8.0)

## 2019-09-22 LAB — CBC WITH DIFFERENTIAL/PLATELET
Abs Immature Granulocytes: 0.06 10*3/uL (ref 0.00–0.07)
Basophils Absolute: 0.1 10*3/uL (ref 0.0–0.1)
Basophils Relative: 1 %
Eosinophils Absolute: 0.2 10*3/uL (ref 0.0–0.5)
Eosinophils Relative: 2 %
HCT: 43.6 % (ref 36.0–46.0)
Hemoglobin: 14.1 g/dL (ref 12.0–15.0)
Immature Granulocytes: 1 %
Lymphocytes Relative: 19 %
Lymphs Abs: 2.3 10*3/uL (ref 0.7–4.0)
MCH: 26.9 pg (ref 26.0–34.0)
MCHC: 32.3 g/dL (ref 30.0–36.0)
MCV: 83.2 fL (ref 80.0–100.0)
Monocytes Absolute: 1 10*3/uL (ref 0.1–1.0)
Monocytes Relative: 8 %
Neutro Abs: 8.6 10*3/uL — ABNORMAL HIGH (ref 1.7–7.7)
Neutrophils Relative %: 69 %
Platelets: 338 10*3/uL (ref 150–400)
RBC: 5.24 MIL/uL — ABNORMAL HIGH (ref 3.87–5.11)
RDW: 15.4 % (ref 11.5–15.5)
WBC: 12.2 10*3/uL — ABNORMAL HIGH (ref 4.0–10.5)
nRBC: 0 % (ref 0.0–0.2)

## 2019-09-22 LAB — POC URINE PREG, ED: Preg Test, Ur: NEGATIVE

## 2019-09-22 MED ORDER — PROCHLORPERAZINE EDISYLATE 10 MG/2ML IJ SOLN
10.0000 mg | Freq: Once | INTRAMUSCULAR | Status: AC
  Administered 2019-09-22: 06:00:00 10 mg via INTRAVENOUS
  Filled 2019-09-22: qty 2

## 2019-09-22 MED ORDER — ONDANSETRON HCL 4 MG/2ML IJ SOLN
4.0000 mg | Freq: Once | INTRAMUSCULAR | Status: AC
  Administered 2019-09-22: 4 mg via INTRAVENOUS
  Filled 2019-09-22: qty 2

## 2019-09-22 MED ORDER — SODIUM CHLORIDE 0.9 % IV BOLUS
1000.0000 mL | Freq: Once | INTRAVENOUS | Status: AC
  Administered 2019-09-22: 03:00:00 1000 mL via INTRAVENOUS

## 2019-09-22 MED ORDER — PROCHLORPERAZINE MALEATE 10 MG PO TABS: 10.0000 mg | ORAL_TABLET | Freq: Four times a day (QID) | ORAL | 0 refills | Status: DC | PRN

## 2019-09-22 NOTE — Discharge Instructions (Addendum)
Drink plenty of fluids.  Return if symptoms are getting worse. 

## 2019-09-22 NOTE — ED Provider Notes (Signed)
[facility] EMERGENCY DEPARTMENT Provider Note   CSN: [id number] Arrival date & time: 09/22/19  0121   History Chief Complaint  Patient presents with  . Fatigue    Sue Jackson is a 39 y.o. female.  The history is provided by the patient.  She has history of hypertension and prediabetes and comes in complaining of sweats and fatigue which started yesterday.  She had been diagnosed with COVID-19 on January 22.  At that time, she had loss of sense of smell and taste.  Sense of smell and taste are slowly returning.  She denies fever or chills.  She has had some nausea without vomiting.  She denies any diarrhea.  She denies arthralgias or myalgias.  Past Medical History:  Diagnosis Date  . Anxiety   . Arthritis   . Depression   . Hypertension     Patient Active Problem List   Diagnosis Date Noted  . Prediabetes 08/15/2018  . Anxiety disorder 02/06/2018  . Low back pain radiating to right lower extremity 07/06/2017  . Upper back pain, chronic 07/06/2017  . Hypertension, essential, benign 03/11/2017  . Bipolar disorder with depression ([place]) 03/11/2017  . Class 1 obesity with body mass index (BMI) of 34.0 to 34.9 in adult 03/11/2017    History reviewed. No pertinent surgical history.   OB History   No obstetric history on file.     Family History  Problem Relation Age of Onset  . Arthritis Mother   . Asthma Mother   . Cancer Maternal Aunt        breast  . Depression Maternal Aunt   . Hypertension Maternal Aunt   . Diabetes Maternal Grandfather   . Heart disease Maternal Grandfather   . Hypertension Maternal Grandfather   . Stroke Maternal Grandfather     Social History   Tobacco Use  . Smoking status: Former Research scientist (life sciences)  . Smokeless tobacco: Never Used  Substance Use Topics  . Alcohol use: Yes    Comment: occasionally  . Drug use: No    Home Medications Prior to Admission medications   Medication Sig Start Date End Date Taking?  Authorizing Provider  amLODipine-benazepril (LOTREL) 5-10 MG capsule TAKE 1 CAPSULE BY MOUTH EVERY DAY 04/25/19   Martinique, Betty G, MD  fluticasone [facility]) 50 MCG/ACT nasal spray Place 1 spray into both nostrils 2 (two) times daily. 07/10/19   Martin[place], Betty G, MD  meclizine (ANTIVERT) 25 MG tablet Take 1 tablet (25 mg total) by mouth 2 (two) times daily as needed for dizziness. 04/06/19   [place], Betty G, MD  propranolol (INDERAL) 40 MG tablet TAKE 1 TABLET (40 MG) BY MOUTH 2 TIMES DAILY. 08/08/19   Martin[place], Betty G, MD  tiZANidine (ZANAFLEX) 4 MG tablet Take 1 tablet (4 mg total) by mouth 2 (two) times daily as needed for muscle spasms. 08/08/19   [place], Betty G, MD    Allergies    Patient has no known allergies.  Review of Systems   Review of Systems  All other systems reviewed and are negative.   Physical Exam Updated Vital Signs BP 139/90 (BP Location: Right Arm)   Pulse 86   Temp 98 F (36.7 C) (Oral)   Resp 18   Ht 5\' 6"  (1.676 m)   Wt 99.8 kg   SpO2 100%   BMI 35.51 kg/m   Physical Exam Vitals and nursing note reviewed.   39 year old female, resting comfortably and in no acute distress. Vital signs  are normal. Oxygen saturation is 100%, which is normal. Head is normocephalic and atraumatic. PERRLA, EOMI. Oropharynx is clear. Neck is nontender and supple without adenopathy or JVD. Back is nontender and there is no CVA tenderness. Lungs are clear without rales, wheezes, or rhonchi. Chest is nontender. Heart has regular rate and rhythm without murmur. Abdomen is soft, flat, nontender without masses or hepatosplenomegaly and peristalsis is normoactive. Extremities have no cyanosis or edema, full range of motion is present. Skin is warm and dry without rash. Neurologic: Mental status is normal, cranial nerves are intact, there are no motor or sensory deficits.  ED Results / Procedures / Treatments   Labs (all labs ordered are listed, but only abnormal results are  displayed) Labs Reviewed  CBC WITH DIFFERENTIAL/PLATELET - Abnormal; Notable for the following components:      Result Value   WBC 12.2 (*)    RBC 5.24 (*)    Neutro Abs 8.6 (*)    All other components within normal limits  COMPREHENSIVE METABOLIC PANEL - Abnormal; Notable for the following components:   Glucose, Bld 107 (*)    All other components within normal limits  URINALYSIS, ROUTINE W REFLEX MICROSCOPIC - Abnormal; Notable for the following components:   Color, Urine STRAW (*)    All other components within normal limits  POC URINE PREG, ED   Procedures Procedures   Medications Ordered in ED Medications  ondansetron (ZOFRAN) injection 4 mg (4 mg Intravenous Given 09/22/19 0230)  sodium chloride 0.9 % bolus 1,000 mL (0 mLs Intravenous Stopped 09/22/19 0549)  prochlorperazine (COMPAZINE) injection 10 mg (10 mg Intravenous Given 09/22/19 0530)    ED Course  I have reviewed the triage vital signs and the nursing notes.  Pertinent lab results that were available during my care of the patient were reviewed by me and considered in my medical decision making (see chart for details).  MDM Rules/Calculators/A&P  Vague symptoms of nausea, sweats, fatigue which probably represents a viral syndrome.  Old records were reviewed confirming diagnosis of COVID-19 on January 22.  Given length of time from original diagnosis, this is felt to be very unlikely due to Covid.  Will check screening labs and give IV fluids and ondansetron.  Labs are unremarkable.  She had no relief of nausea with ondansetron.  She is given a dose of prochlorperazine with significant improvement in nausea.  She is advised of lab findings and is discharged with prescription for prochlorperazine.  Encouraged her to maintain adequate oral intake.  Return precautions discussed.  Whitley Dalpiaz was evaluated in Emergency Department on 09/22/2019 for the symptoms described in the history of present illness. She was  evaluated in the context of the global COVID-19 pandemic, which necessitated consideration that the patient might be at risk for infection with the SARS-CoV-2 virus that causes COVID-19. Institutional protocols and algorithms that pertain to the evaluation of patients at risk for COVID-19 are in a state of rapid change based on information released by regulatory bodies including the CDC and federal and state organizations. These policies and algorithms were followed during the patient's care in the ED.  Final Clinical Impression(s) / ED Diagnoses Final diagnoses:  Influenza-like illness  Nausea    Rx / DC Orders ED Discharge Orders         Ordered    prochlorperazine (COMPAZINE) 10 MG tablet  Every 6 hours PRN     09/22/19 [id number]           Delora Fuel,  MD 09/22/19 [id number]

## 2019-09-22 NOTE — ED Triage Notes (Signed)
Pt states she was tested positive for COVID 16 08/31/2019, today she has increase fatigue, getting more diaphoretic and increase anxiety.

## 2019-10-06 ENCOUNTER — Other Ambulatory Visit: Payer: Self-pay | Admitting: Family Medicine

## 2019-10-06 DIAGNOSIS — I10 Essential (primary) hypertension: Secondary | ICD-10-CM

## 2019-10-15 ENCOUNTER — Telehealth: Payer: Self-pay | Admitting: *Deleted

## 2019-10-15 NOTE — Telephone Encounter (Signed)
The fmla is in regards to pt's lightheadedness & dizziness that happens from time to time.

## 2019-10-15 NOTE — Telephone Encounter (Signed)
Patient called wanting to know if Dr. Martinique would sign FMLA paperwork. Patient stated it is time for a renewal of forms. Please advise and give patient a call to let her know so that she can get forms faxed over to Dr. Martinique.

## 2019-10-16 NOTE — Telephone Encounter (Signed)
I do not fill out FMLA for possible future illness. If dizziness and lightheadedness are interfering with work, we may need to re-evaluate and make recommendations accordingly.  Thanks, BJ

## 2019-10-17 NOTE — Telephone Encounter (Signed)
Pt will need an appt with pcp.

## 2019-10-17 NOTE — Telephone Encounter (Signed)
Pt is scheduled for 3/19 at 11am

## 2019-10-25 ENCOUNTER — Other Ambulatory Visit: Payer: Self-pay

## 2019-10-26 ENCOUNTER — Encounter: Payer: Self-pay | Admitting: Family Medicine

## 2019-10-26 ENCOUNTER — Encounter (INDEPENDENT_AMBULATORY_CARE_PROVIDER_SITE_OTHER): Payer: 59 | Admitting: Family Medicine

## 2019-10-26 VITALS — Ht 66.0 in

## 2019-10-26 DIAGNOSIS — Z0289 Encounter for other administrative examinations: Secondary | ICD-10-CM

## 2019-10-26 NOTE — Progress Notes (Signed)
NO SHOW

## 2019-10-30 ENCOUNTER — Encounter: Payer: Self-pay | Admitting: Family Medicine

## 2019-10-30 ENCOUNTER — Encounter: Payer: Self-pay | Admitting: *Deleted

## 2019-10-30 ENCOUNTER — Telehealth (INDEPENDENT_AMBULATORY_CARE_PROVIDER_SITE_OTHER): Payer: 59 | Admitting: Family Medicine

## 2019-10-30 ENCOUNTER — Telehealth: Payer: 59 | Admitting: Family Medicine

## 2019-10-30 VITALS — BP 132/70 | HR 68 | Temp 96.2°F | Ht 66.0 in | Wt 227.0 lb

## 2019-10-30 DIAGNOSIS — R42 Dizziness and giddiness: Secondary | ICD-10-CM

## 2019-10-30 DIAGNOSIS — Z6836 Body mass index (BMI) 36.0-36.9, adult: Secondary | ICD-10-CM

## 2019-10-30 DIAGNOSIS — I10 Essential (primary) hypertension: Secondary | ICD-10-CM

## 2019-10-30 DIAGNOSIS — F419 Anxiety disorder, unspecified: Secondary | ICD-10-CM | POA: Diagnosis not present

## 2019-10-30 MED ORDER — PROPRANOLOL HCL 40 MG PO TABS: 20.0000 mg | ORAL_TABLET | Freq: Every day | ORAL | 2 refills | Status: DC

## 2019-10-30 MED ORDER — AMLODIPINE BESY-BENAZEPRIL HCL 5-10 MG PO CAPS: ORAL_CAPSULE | ORAL | 2 refills | Status: DC

## 2019-10-30 NOTE — Progress Notes (Signed)
Virtual Visit via Video Note   I connected with [name] on 10/30/19 by a video enabled telemedicine application and verified that I am speaking with the correct person using two identifiers.  Location patient: home Location provider:work office Persons participating in the virtual visit: patient, provider  I discussed the limitations of evaluation and management by telemedicine and the availability of in person appointments. The patient expressed understanding and agreed to proceed.   HPI: [name] is a 39 yo female with Hx of anxiety,depression,and HTN among some, who is requesting a letter so she can continue with with FMLA arrangement from last year.  She requested FMLA because she has had episodes of dizziness/lightheadedness at work. Problem has improved, she does not feel like it is affecting her job performance. She has had to use FMLA 1-2 times since her last visit.  Problem seems to be exacerbated by stress and anxiety.  No associated fever,chills,headache,visual changes,abdominal pain,N/V, tremor,or syncope.  BP is usually well controlled. States that she can feel when her BP is elevated, also aggravated by stress.  Anxiety: She has not tolerated SSRI nor SNRI's. She is on Propranolol 40 mg am. Denies depressed mood. She has an appt with psychiatrist on 11/16/19.  Having panic attacks: Feels hot,chest tightness, and dizziness. Alleviated by breathing exercises.  HTN:  She is on Amlodipine-Benazepril 5-10 mg daily. Occasionally BP is elevated but in general < 140/90. Denies severe/frequent headache, visual changes,exertional chest pain, dyspnea, claudication, focal weakness, or edema.  Lab Results  Component Value Date   CREATININE 0.92 09/22/2019   BUN 20 09/22/2019   NA 135 09/22/2019   K 4.0 09/22/2019   CL 100 09/22/2019   CO2 23 09/22/2019    She has gained some wt. Sick with COVID 19 infection in 08/29/19., she was home for a month,increased food  intake. Not exercising regularly, planning on buying a treadmill. Hx of prediabetes. Denies abdominal pain, nausea,vomiting, polydipsia,polyuria, or polyphagia.  Lab Results  Component Value Date   HGBA1C 6.0 (A) 08/15/2018    ROS: See pertinent positives and negatives per HPI.  Past Medical History:  Diagnosis Date  . Anxiety   . Arthritis   . Depression   . Hypertension     No past surgical history on file.  Family History  Problem Relation Age of Onset  . Arthritis Mother   . Asthma Mother   . Cancer Maternal Aunt        breast  . Depression Maternal Aunt   . Hypertension Maternal Aunt   . Diabetes Maternal Grandfather   . Heart disease Maternal Grandfather   . Hypertension Maternal Grandfather   . Stroke Maternal Grandfather     Social History   Socioeconomic History  . Marital status: Married    Spouse name: Not on file  . Number of children: Not on file  . Years of education: Not on file  . Highest education level: Not on file  Occupational History  . Not on file  Tobacco Use  . Smoking status: Former Research scientist (life sciences)  . Smokeless tobacco: Never Used  Substance and Sexual Activity  . Alcohol use: Yes    Comment: occasionally  . Drug use: No  . Sexual activity: Yes    Birth control/protection: None  Other Topics Concern  . Not on file  Social History Narrative  . Not on file   Social Determinants of Health   Financial Resource Strain:   . Difficulty of Paying Living Expenses:   Food  Insecurity:   . Worried About Charity fundraiser in the Last Year:   . Arboriculturist in the Last Year:   Transportation Needs:   . Film/video editor (Medical):   Marland Kitchen Lack of Transportation (Non-Medical):   Physical Activity:   . Days of Exercise per Week:   . Minutes of Exercise per Session:   Stress:   . Feeling of Stress :   Social Connections:   . Frequency of Communication with Friends and Family:   . Frequency of Social Gatherings with Friends and Family:    . Attends Religious Services:   . Active Member of Clubs or Organizations:   . Attends Archivist Meetings:   Marland Kitchen Marital Status:   Intimate Partner Violence:   . Fear of Current or Ex-Partner:   . Emotionally Abused:   Marland Kitchen Physically Abused:   . Sexually Abused:     Current Outpatient Medications:  .  amLODipine-benazepril (LOTREL) 5-10 MG capsule, TAKE 1 CAPSULE BY MOUTH EVERY DAY, Disp: 90 capsule, Rfl: 2 .  fluticasone (FLONASE) 50 MCG/ACT nasal spray, Place 1 spray into both nostrils 2 (two) times daily., Disp: 16 g, Rfl: 2 .  meclizine (ANTIVERT) 25 MG tablet, Take 1 tablet (25 mg total) by mouth 2 (two) times daily as needed for dizziness., Disp: 30 tablet, Rfl: 0 .  propranolol (INDERAL) 40 MG tablet, Take 0.5 tablets (20 mg total) by mouth daily., Disp: 90 tablet, Rfl: 2 .  tiZANidine (ZANAFLEX) 4 MG tablet, Take 1 tablet (4 mg total) by mouth 2 (two) times daily as needed for muscle spasms., Disp: 30 tablet, Rfl: 0  EXAM:  VITALS per patient if applicable:BP [id number]   Pulse 68   Temp (!) 96.2 F (35.7 C) (Oral)   Ht 5\' 6"  (1.676 m)   Wt 227 lb (103 kg)   LMP 10/28/2019   BMI 36.64 kg/m   GENERAL: alert, oriented, appears well and in no acute distress  HEENT: atraumatic, conjunttiva clear, no obvious abnormalities on inspection.  NECK: normal movements of the head and neck  LUNGS: on inspection no signs of respiratory distress, breathing rate appears normal, no obvious gross SOB, gasping or wheezing  CV: no obvious cyanosis  MS: moves all visible extremities without noticeable abnormality  PSYCH/NEURO: pleasant and cooperative, no obvious depression or anxiety, speech and thought processing grossly intact  ASSESSMENT AND PLAN:  Discussed the following assessment and plan:  Lightheaded Problem has improved. She is not interested in referral to neurologist or further work up.. Letter for employer will be issued, 2 episodes in 6 months, 4 hours total x  6 months.  Anxiety disorder, unspecified type - Plan: propranolol (INDERAL) 40 MG tablet Problem is not well controlled. She is planning on establishing with psychiatrist or 11/16/2019. Strongly recommend keeping appointment.  Hypertension, essential, benign - Plan: propranolol (INDERAL) 40 MG tablet, amLODipine-benazepril (LOTREL) 5-10 MG capsule Rechecked by patient, BP adequately controlled. BP seems to be aggravated by anxiety.  Continue monitoring BP at home. No changes in current management. Continue low-salt diet.  Class 2 severe obesity due to excess calories with serious comorbidity and body mass index (BMI) of 36.0 to 36.9 in adult (HCC) BI from 34 to 36. Planning on exercising regularly. Consistency with healthy diet and physical activity.  I discussed the assessment and treatment plan with the patient. Ms Bergfeld was provided an opportunity to ask questions and all were answered. Sheagreed with the plan and demonstrated an understanding  of the instructions.    Return in about 6 months (around 05/01/2020) for HTN,prediabetes,wt,.    Othal Kubitz [place], MD

## 2019-11-07 ENCOUNTER — Encounter (INDEPENDENT_AMBULATORY_CARE_PROVIDER_SITE_OTHER): Payer: Self-pay

## 2019-11-09 ENCOUNTER — Ambulatory Visit: Payer: 59 | Admitting: Family Medicine

## 2019-11-12 ENCOUNTER — Ambulatory Visit: Payer: 59 | Admitting: Family Medicine

## 2019-11-13 ENCOUNTER — Ambulatory Visit: Payer: Self-pay | Admitting: Psychiatry

## 2019-11-16 ENCOUNTER — Encounter: Payer: Self-pay | Admitting: Psychiatry

## 2019-11-16 ENCOUNTER — Ambulatory Visit (INDEPENDENT_AMBULATORY_CARE_PROVIDER_SITE_OTHER): Payer: 59 | Admitting: Psychiatry

## 2019-11-16 ENCOUNTER — Other Ambulatory Visit: Payer: Self-pay

## 2019-11-16 DIAGNOSIS — F3181 Bipolar II disorder: Secondary | ICD-10-CM | POA: Diagnosis not present

## 2019-11-16 DIAGNOSIS — F419 Anxiety disorder, unspecified: Secondary | ICD-10-CM | POA: Diagnosis not present

## 2019-11-16 MED ORDER — LORAZEPAM 0.5 MG PO TABS: 0.5000 mg | ORAL_TABLET | Freq: Every day | ORAL | 1 refills | Status: DC | PRN

## 2019-11-16 NOTE — Progress Notes (Addendum)
Psychiatric Initial Adult Assessment   I connected with  Leonda Cosma on 11/16/19 by a video enabled telemedicine application and verified that I am speaking with the correct person using two identifiers.   I discussed the limitations of evaluation and management by telemedicine. The patient expressed understanding and agreed to proceed.    Patient Identification: Sue Jackson MRN:  [id number] Date of Evaluation:  11/16/2019   Referral Source: Self  Chief Complaint:   " I feel very anxious most of the time.  I think it worsened after I got Covid in February."  Visit Diagnosis:    ICD-10-CM   1. Anxiety  F41.9   2. History of Bipolar 2 disorder ([place]), now in remission  F31.81     History of Present Illness: This is a 39 year old female with history of bipolar 2 disorder now in remission and anxiety now seen for psychiatric evaluation by self-referral.  Patient stated that she is always dealt with anxiety issues and has had panic attacks in the past.  She stated that she contracted COVID-19 infection February and following the infection she has noticed that she has felt very anxious almost all the time.  She stated that she feels anxious as soon as she gets up in the morning.  She feels like there is a rush inside her and she cannot rest her calm down.  She stated she feels restless and notices that her hands shake frequently.  She stated that she thinks excessively about trivial things. She stated that over the years she was taking propanolol that was helping her with anxiety as well as with her tremors.  However after taking propanolol 40 mg 3 times a day the dose was gradually tapered down to twice a day to daily to the current dose of just half tablet.  She stated she is currently taking 20 mg propanolol daily.  She stated that the decision to cut down the dose was made after it started impacting her blood pressure and she started having significant bradycardia due to high doses.  She  informed that she would like to maintain the low-dose of propanolol as she feels it does help her with her tremors significantly. She informed that lately she has started exercising and that has been helpful.  She is focused on losing weight and also getting a better control of blood pressure.  She is on amlodipine and Benzapril combination for her hypertension.  Regarding her diagnosis of bipolar depression, she informed that she has had difficulties with depression and mood swings since late teen age years.  She stated that it became a big problem in her early 36s.  She was hospitalized in 2013 after overdosing on Effexor.  She was formally given the diagnosis of bipolar depression at that time.  She stated that she has also been on Zoloft which she thinks made her crazy.  She also took Prozac which did not help her much.  She was prescribed lithium which did help her mood but she did not like how it made her feel, and associated emotional numbness.  She also recalls being prescribed Seroquel but cannot recall if she took it or not.  Currently, she denied feelings of depression or sadness.  She denied anhedonia.  She denied poor concentration or poor energy levels.  She denied suicidal ideations.  She reported that she works at the [employer] from 3:30 PM to 12 midnight.  She comes home and does some exercise and then goes to bed around  3 AM and then wakes up around 1:30 in the afternoon.  She is consistently getting about 8 hours of sleep. She denied any symptom suggestive of hypomania or mania.  She denied excessive irritability or impulsivity.  She denied racing thoughts, she denied decreased need for sleep.  She denied any symptom suggestive of psychosis.  She denied any PTSD symptoms.  She lives with her partner/wife.  She does not have any children.    Past Psychiatric History: Bipolar depression, anxiety.  History of 1 prior psychiatry hospitalization in 2013 after overdosing on  Effexor.  Previous Psychotropic Medications: Yes , Effexor-overdosed, did not like the side effects, Prozac-did not help much, Zoloft-made her feel "crazy", lithium-help with mood but did not like the way it made her feel, Seroquel-cannot recall if she took it regularly.  Substance Abuse History in the last 12 months:  No.  Consequences of Substance Abuse: NA  Past Medical History:  Past Medical History:  Diagnosis Date  . Anxiety   . Arthritis   . Depression   . Hypertension    No past surgical history on file.  Family Psychiatric History: Maternal aunt- depression  Family History:  Family History  Problem Relation Age of Onset  . Arthritis Mother   . Asthma Mother   . Cancer Maternal Aunt        breast  . Depression Maternal Aunt   . Hypertension Maternal Aunt   . Diabetes Maternal Grandfather   . Heart disease Maternal Grandfather   . Hypertension Maternal Grandfather   . Stroke Maternal Grandfather     Social History:   Social History   Socioeconomic History  . Marital status: Married    Spouse name: Not on file  . Number of children: Not on file  . Years of education: Not on file  . Highest education level: Not on file  Occupational History  . Not on file  Tobacco Use  . Smoking status: Former Research scientist (life sciences)  . Smokeless tobacco: Never Used  Substance and Sexual Activity  . Alcohol use: Yes    Comment: occasionally  . Drug use: No  . Sexual activity: Yes    Birth control/protection: None  Other Topics Concern  . Not on file  Social History Narrative  . Not on file   Social Determinants of Health   Financial Resource Strain:   . Difficulty of Paying Living Expenses:   Food Insecurity:   . Worried About Charity fundraiser in the Last Year:   . Arboriculturist in the Last Year:   Transportation Needs:   . Film/video editor (Medical):   Marland Kitchen Lack of Transportation (Non-Medical):   Physical Activity:   . Days of Exercise per Week:   . Minutes of  Exercise per Session:   Stress:   . Feeling of Stress :   Social Connections:   . Frequency of Communication with Friends and Family:   . Frequency of Social Gatherings with Friends and Family:   . Attends Religious Services:   . Active Member of Clubs or Organizations:   . 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  Lives with her partner/wife.  Does not have any children.  Allergies:  No Known Allergies  Metabolic Disorder Labs: Lab Results  Component Value Date   HGBA1C 6.0 (A) 08/15/2018   No results found for: PROLACTIN  Lab Results  Component Value Date   CHOL 158 03/11/2017   TRIG 86.0 03/11/2017   HDL 46.40 03/11/2017   CHOLHDL 3 03/11/2017   VLDL 17.2 03/11/2017   [place] 95 03/11/2017   Lab Results  Component Value Date   TSH 2.51 11/21/2018    Therapeutic Level Labs: No results found for: LITHIUM No results found for: CBMZ No results found for: VALPROATE  Current Medications: Current Outpatient Medications  Medication Sig Dispense Refill  . amLODipine-benazepril (LOTREL) 5-10 MG capsule TAKE 1 CAPSULE BY MOUTH EVERY DAY 90 capsule 2  . fluticasone (FLONASE) 50 MCG/ACT nasal spray Place 1 spray into both nostrils 2 (two) times daily. 16 g 2  . meclizine (ANTIVERT) 25 MG tablet Take 1 tablet (25 mg total) by mouth 2 (two) times daily as needed for dizziness. 30 tablet 0  . propranolol (INDERAL) 40 MG tablet Take 0.5 tablets (20 mg total) by mouth daily. 90 tablet 2  . tiZANidine (ZANAFLEX) 4 MG tablet Take 1 tablet (4 mg total) by mouth 2 (two) times daily as needed for muscle spasms. 30 tablet 0   No current facility-administered medications for this visit.    Musculoskeletal: Strength & Muscle Tone: unable to assess due to telemed visit Gait & Station: unable to assess due to telemed visit Patient leans: unable to assess due to telemed  visit   Psychiatric Specialty Exam: Review of Systems  Last menstrual period 10/28/2019.There is no height or weight on file to calculate BMI.  General Appearance: Well Groomed  Eye Contact:  Good  Speech:  Clear and Coherent and Normal Rate  Volume:  Normal  Mood:  Anxious  Affect:  Congruent  Thought Process:  Goal Directed and Descriptions of Associations: Intact  Orientation:  Full (Time, Place, and Person)  Thought Content:  Logical  Suicidal Thoughts:  No  Homicidal Thoughts:  No  Memory:  Immediate;   Good Recent;   Good Remote;   Good  Judgement:  Fair  Insight:  Fair  Psychomotor Activity:  Normal  Concentration:  Concentration: Good and Attention Span: Good  Recall:  Good  Fund of Knowledge:Good  Language: Good  Akathisia:  Negative  Handed:  Right  AIMS (if indicated):  not done  Assets:  Communication Skills Desire for Improvement Financial Resources/Insurance Housing Social Support Vocational/Educational  ADL's:  Intact  Cognition: WNL  Sleep:  Good    Assessment and Plan: Based on patient's history patient does not appear to have active depressive or hypomanic symptoms at this time.  She is endorsing significant anxiety specifically after she contracted COVID-19 February.  Latest literature suggests increase in anxiety levels in one third of the patients after contracting COVID-19 infection.  The long-term impact of COVID-19 is still not clear.  Patient does not have history of any addiction or substance use disorder.  Based on this would prescribe short-term course of lorazepam to target anxiety symptoms.  Will monitor closely.   1. Anxiety  - Start LORazepam (ATIVAN) 0.5 MG tablet; Take 1 tablet (0.5 mg total) by mouth daily as needed for anxiety.  Dispense: 45 tablet; Refill: 1  Patient was advised not to drive after taking it. Patient was suggested to try taking half tablet twice daily to see if that works better.  2. History of Bipolar 2 disorder  ([place]), now in remission  F/up in 6 weeks.   [place] Crane, MD 4/9/20219:17 AM

## 2019-12-31 ENCOUNTER — Other Ambulatory Visit: Payer: Self-pay

## 2019-12-31 ENCOUNTER — Encounter: Payer: Self-pay | Admitting: Psychiatry

## 2019-12-31 ENCOUNTER — Telehealth (INDEPENDENT_AMBULATORY_CARE_PROVIDER_SITE_OTHER): Payer: 59 | Admitting: Psychiatry

## 2019-12-31 DIAGNOSIS — F3181 Bipolar II disorder: Secondary | ICD-10-CM

## 2019-12-31 DIAGNOSIS — F419 Anxiety disorder, unspecified: Secondary | ICD-10-CM | POA: Diagnosis not present

## 2019-12-31 MED ORDER — LORAZEPAM 0.5 MG PO TABS: 0.5000 mg | ORAL_TABLET | Freq: Every day | ORAL | 1 refills | Status: DC | PRN

## 2019-12-31 NOTE — Progress Notes (Addendum)
[facility] MD/PA/NP OP Progress Note Virtual Visit via Video Note  I connected with Sue Jackson on 12/31/19 at 11:00 AM EDT by a video enabled telemedicine application and verified that I am speaking with the correct person using two identifiers.  Location: Patient: Home Provider: Clinic   I discussed the limitations of evaluation and management by telemedicine and the availability of in person appointments. The patient expressed understanding and agreed to proceed.  I provided 14 minutes of non-face-to-face time during this encounter.    12/31/2019 10:57 AM Sue Jackson  MRN:  [id number]  Chief Complaint: "I've noticed a change. For the first time I feel satisfied and not like a [place] pig".   HPI: Patient seen today for follow up.  She stated that generally takes her medications at 6:00 am. Her usual work shift is 3:30 am -12:00 pm.  Overall she she has seen an improvement in her anxiety and states that Ativan is effective in managing her symptoms. At times she notes that she needs an extra dose of ativan in the evenings. She did not request medication adjustments at this time and is agreeable to continue dose as prescribed.   Patient request doctors note to be excused from work today due to increased sleepiness as she took the morning dose later than usual.  No other concerns noted at this time.   Visit Diagnosis:    ICD-10-CM   1. Anxiety  F41.9   2. History of Bipolar 2 disorder ([place]), now in remission  F31.81     Past Psychiatric History: Bipolar depression, anxiety.  History of 1 prior psychiatry hospitalization in 2013 after overdosing on Effexor.  Past Medical History:  Past Medical History:  Diagnosis Date  . Anxiety   . Arthritis   . Depression   . Hypertension    No past surgical history on file.  Family Psychiatric History:  Maternal aunt- depression   Family History:  Family History  Problem Relation Age of Onset  . Arthritis Mother   . Asthma Mother    . Cancer Maternal Aunt        breast  . Depression Maternal Aunt   . Hypertension Maternal Aunt   . Diabetes Maternal Grandfather   . Heart disease Maternal Grandfather   . Hypertension Maternal Grandfather   . Stroke Maternal Grandfather     Social History:  Social History   Socioeconomic History  . Marital status: Married    Spouse name: Not on file  . Number of children: Not on file  . Years of education: Not on file  . Highest education level: Not on file  Occupational History  . Not on file  Tobacco Use  . Smoking status: Former Research scientist (life sciences)  . Smokeless tobacco: Never Used  Substance and Sexual Activity  . Alcohol use: Yes    Comment: occasionally  . Drug use: No  . Sexual activity: Yes    Birth control/protection: None  Other Topics Concern  . Not on file  Social History Narrative  . Not on file   Social Determinants of Health   Financial Resource Strain:   . Difficulty of Paying Living Expenses:   Food Insecurity:   . Worried About Charity fundraiser in the Last Year:   . Arboriculturist in the Last Year:   Transportation Needs:   . Film/video editor (Medical):   Marland Kitchen Lack of Transportation (Non-Medical):   Physical Activity:   . Days of Exercise per Week:   .  Minutes of Exercise per Session:   Stress:   . Feeling of Stress :   Social Connections:   . Frequency of Communication with Friends and Family:   . Frequency of Social Gatherings with Friends and Family:   . Attends Religious Services:   . Active Member of Clubs or Organizations:   . Attends Archivist Meetings:   Marland Kitchen Marital Status:     Allergies: No Known Allergies  Metabolic Disorder Labs: Lab Results  Component Value Date   HGBA1C 6.0 (A) 08/15/2018   No results found for: PROLACTIN Lab Results  Component Value Date   CHOL 158 03/11/2017   TRIG 86.0 03/11/2017   HDL 46.40 03/11/2017   CHOLHDL 3 03/11/2017   VLDL 17.2 03/11/2017   [place] 95 03/11/2017   Lab Results   Component Value Date   TSH 2.51 11/21/2018   TSH 3.68 03/11/2017    Therapeutic Level Labs: No results found for: LITHIUM No results found for: VALPROATE No components found for:  CBMZ  Current Medications: Current Outpatient Medications  Medication Sig Dispense Refill  . amLODipine-benazepril (LOTREL) 5-10 MG capsule TAKE 1 CAPSULE BY MOUTH EVERY DAY 90 capsule 2  . fluticasone (FLONASE) 50 MCG/ACT nasal spray Place 1 spray into both nostrils 2 (two) times daily. 16 g 2  . LORazepam (ATIVAN) 0.5 MG tablet Take 1 tablet (0.5 mg total) by mouth daily as needed for anxiety. 45 tablet 1  . meclizine (ANTIVERT) 25 MG tablet Take 1 tablet (25 mg total) by mouth 2 (two) times daily as needed for dizziness. 30 tablet 0  . propranolol (INDERAL) 40 MG tablet Take 0.5 tablets (20 mg total) by mouth daily. 90 tablet 2  . tiZANidine (ZANAFLEX) 4 MG tablet Take 1 tablet (4 mg total) by mouth 2 (two) times daily as needed for muscle spasms. 30 tablet 0   No current facility-administered medications for this visit.     Musculoskeletal: Strength & Muscle Tone: Did not assess due to telehealth visit.  Gait & Station: Did not assess due to telehealth visit.  Patient leans: Did not assess due to telehealth visit.   Psychiatric Specialty Exam: Review of Systems  There were no vitals taken for this visit.There is no height or weight on file to calculate BMI.  General Appearance: Well Groomed  Eye Contact:  Good  Speech:  Clear and Coherent  Volume:  Normal  Mood:  Euthymic  Affect:  Congruent  Thought Process:  Coherent, Goal Directed and Linear  Orientation:  Full (Time, Place, and Person)  Thought Content: WDL and Logical   Suicidal Thoughts:  No  Homicidal Thoughts:  No  Memory:  Immediate;   Good Recent;   Good Remote;   Good  Judgement:  Good  Insight:  Good  Psychomotor Activity:  Normal  Concentration:  Concentration: Good and Attention Span: Good  Recall:  Good  Fund of  Knowledge: Good  Language: Good  Akathisia:  No  Handed:  Right  AIMS (if indicated): Did not assess due to telehealth visit.   Assets:  Communication Skills Desire for Improvement Financial Resources/Insurance Housing  ADL's:  Intact  Cognition: WNL  Sleep:  Good    Assessment and Plan: Patient reports that her anxiety is managed on Ativan 0.5 mg tablets. She is agreeable to continue current dose.  1. Anxiety  - Continue LORazepam (ATIVAN) 0.5 MG tablet; Take 1 tablet (0.5 mg total) by mouth daily as needed for anxiety.  Dispense: 45 tablet;  Refill: 1  2. History of Bipolar 2 disorder ([place]), now in remission   Follow up in two months.   Salley Slaughter, NP 12/31/2019, 10:57 AM   I saw and managed the pt with NP B. Ronne Binning.  [place] Crane, MD 12/31/2019 11:58 AM

## 2019-12-31 NOTE — Addendum Note (Signed)
Addended by: [place] Crane on: 12/31/2019 11:59 AM   Modules accepted: Orders, Level of Service

## 2020-01-30 ENCOUNTER — Encounter: Payer: Self-pay | Admitting: Family Medicine

## 2020-01-30 ENCOUNTER — Ambulatory Visit (INDEPENDENT_AMBULATORY_CARE_PROVIDER_SITE_OTHER): Payer: 59 | Admitting: Family Medicine

## 2020-01-30 ENCOUNTER — Other Ambulatory Visit: Payer: Self-pay

## 2020-01-30 ENCOUNTER — Other Ambulatory Visit (HOSPITAL_COMMUNITY)
Admission: RE | Admit: 2020-01-30 | Discharge: 2020-01-30 | Disposition: A | Discharge status: Home / Self Care | Payer: 59 | Source: Ambulatory Visit | Attending: Family Medicine | Admitting: Family Medicine

## 2020-01-30 VITALS — BP 124/82 | HR 60 | Temp 97.7°F | Resp 12 | Ht 66.0 in | Wt 223.0 lb

## 2020-01-30 DIAGNOSIS — Z0000 Encounter for general adult medical examination without abnormal findings: Secondary | ICD-10-CM

## 2020-01-30 DIAGNOSIS — Z1159 Encounter for screening for other viral diseases: Secondary | ICD-10-CM | POA: Diagnosis not present

## 2020-01-30 DIAGNOSIS — Z6835 Body mass index (BMI) 35.0-35.9, adult: Secondary | ICD-10-CM

## 2020-01-30 DIAGNOSIS — Z124 Encounter for screening for malignant neoplasm of cervix: Secondary | ICD-10-CM | POA: Insufficient documentation

## 2020-01-30 DIAGNOSIS — Z1329 Encounter for screening for other suspected endocrine disorder: Secondary | ICD-10-CM

## 2020-01-30 DIAGNOSIS — I10 Essential (primary) hypertension: Secondary | ICD-10-CM

## 2020-01-30 DIAGNOSIS — Z13220 Encounter for screening for lipoid disorders: Secondary | ICD-10-CM

## 2020-01-30 DIAGNOSIS — Z130 Encounter for screening for diseases of the blood and blood-forming organs and certain disorders involving the immune mechanism: Secondary | ICD-10-CM | POA: Diagnosis not present

## 2020-01-30 DIAGNOSIS — Z13228 Encounter for screening for other metabolic disorders: Secondary | ICD-10-CM

## 2020-01-30 DIAGNOSIS — R7303 Prediabetes: Secondary | ICD-10-CM

## 2020-01-30 LAB — LIPID PANEL
Cholesterol: 134 mg/dL (ref 0–200)
HDL: 45.5 mg/dL (ref >39.00)
LDL Cholesterol: 76 mg/dL (ref 0–99)
NonHDL: 88.1
Total CHOL/HDL Ratio: 3
Triglycerides: 62 mg/dL (ref 0.0–149.0)
VLDL: 12.4 mg/dL (ref 0.0–40.0)

## 2020-01-30 LAB — COMPREHENSIVE METABOLIC PANEL
ALT: 25 U/L (ref 0–35)
AST: 15 U/L (ref 0–37)
Albumin: 4.1 g/dL (ref 3.5–5.2)
Alkaline Phosphatase: 72 U/L (ref 39–117)
BUN: 15 mg/dL (ref 6–23)
CO2: 27 mEq/L (ref 19–32)
Calcium: 9 mg/dL (ref 8.4–10.5)
Chloride: 104 mEq/L (ref 96–112)
Creatinine, Ser: 0.85 mg/dL (ref 0.40–1.20)
GFR: 73.66 mL/min (ref >60.00)
Glucose, Bld: 115 mg/dL — ABNORMAL HIGH (ref 70–99)
Potassium: 4.9 mEq/L (ref 3.5–5.1)
Sodium: 134 mEq/L — ABNORMAL LOW (ref 135–145)
Total Bilirubin: 0.3 mg/dL (ref 0.2–1.2)
Total Protein: 6.7 g/dL (ref 6.0–8.3)

## 2020-01-30 LAB — HEMOGLOBIN A1C: Hgb A1c MFr Bld: 6.4 % (ref 4.6–6.5)

## 2020-01-30 NOTE — Assessment & Plan Note (Signed)
Healthier life style for primary prevention recommended.

## 2020-01-30 NOTE — Assessment & Plan Note (Signed)
BP adequately controlled. No changes in current management. Continue low salt diet. 

## 2020-01-30 NOTE — Progress Notes (Signed)
HPI:  [name] is a 39 y.o. female, who is here today for her routine physical.  Last CPE: 3-4 years ago.  Regular exercise 3 or more time per week: Not consistently. Following a healthy diet: 2 weeks ago started eating healthier.Decreased candy intake. She lives with her wife.  Chronic medical problems: HTN,anxiety,bipolar disorder in remission, obesity,prediabetes Since her last visit, 10/30/19, she has established with psychiatrist.Pap smear: 3-4 years ago. Hx of abnormal pap smears: Negative. M:13 G:0  Hx of STD's Many years ago treated for trichomonas.  There is no immunization history on file for this patient.  Mammogram: Never. Colonoscopy: N/A DEXA: N/A  Hep C screening Never.  She has no new concerns today.  HTN: She is on Amlodipine-Benazepril 5-10 mg daily. She takes Propranolol 40 mg 1/2 tab daily as needed, more for anxiety.  Review of Systems  Constitutional: Positive for fatigue. Negative for appetite change and fever.  HENT: Negative for hearing loss, mouth sores and sore throat.   Eyes: Negative for redness and visual disturbance.  Respiratory: Negative for cough, shortness of breath and wheezing.   Cardiovascular: Negative for chest pain and leg swelling.  Gastrointestinal: Positive for nausea (sometimes, exacerbated by skipping meals). Negative for abdominal pain and vomiting.       No changes in bowel habits.  Endocrine: Negative for cold intolerance, heat intolerance, polydipsia, polyphagia and polyuria.  Genitourinary: Negative for decreased urine volume, dysuria, hematuria, vaginal bleeding and vaginal discharge.  Musculoskeletal: Negative for gait problem and myalgias.  Skin: Negative for color change and rash.  Allergic/Immunologic: Positive for environmental allergies.  Neurological: Positive for light-headedness (Chronic). Negative for syncope, weakness and headaches.  Hematological: Negative for adenopathy. Does not bruise/bleed  easily.  Psychiatric/Behavioral: Negative for confusion and sleep disturbance. The patient is nervous/anxious.   All other systems reviewed and are negative.  Current Outpatient Medications on File Prior to Visit  Medication Sig Dispense Refill  . amLODipine-benazepril (LOTREL) 5-10 MG capsule TAKE 1 CAPSULE BY MOUTH EVERY DAY 90 capsule 2  . fluticasone (FLONASE) 50 MCG/ACT nasal spray Place 1 spray into both nostrils 2 (two) times daily. 16 g 2  . LORazepam (ATIVAN) 0.5 MG tablet Take 1 tablet (0.5 mg total) by mouth daily as needed for anxiety. 45 tablet 1  . meclizine (ANTIVERT) 25 MG tablet Take 1 tablet (25 mg total) by mouth 2 (two) times daily as needed for dizziness. 30 tablet 0  . propranolol (INDERAL) 40 MG tablet Take 0.5 tablets (20 mg total) by mouth daily. 90 tablet 2   No current facility-administered medications on file prior to visit.     Past Medical History:  Diagnosis Date  . Anxiety   . Arthritis   . Depression   . Hypertension     History reviewed. No pertinent surgical history.  No Known Allergies  Family History  Problem Relation Age of Onset  . Arthritis Mother   . Asthma Mother   . Cancer Maternal Aunt        breast  . Depression Maternal Aunt   . Hypertension Maternal Aunt   . Diabetes Maternal Grandfather   . Heart disease Maternal Grandfather   . Hypertension Maternal Grandfather   . Stroke Maternal Grandfather     Social History   Socioeconomic History  . Marital status: Married    Spouse name: Not on file  . Number of children: Not on file  . Years of education: Not on file  . Highest education  level: Not on file  Occupational History  . Not on file  Tobacco Use  . Smoking status: Former Research scientist (life sciences)  . Smokeless tobacco: Never Used  Substance and Sexual Activity  . Alcohol use: Yes    Comment: occasionally  . Drug use: No  . Sexual activity: Yes    Birth control/protection: None  Other Topics Concern  . Not on file  Social  History Narrative  . Not on file   Social Determinants of Health   Financial Resource Strain:   . Difficulty of Paying Living Expenses:   Food Insecurity:   . Worried About Charity fundraiser in the Last Year:   . Arboriculturist in the Last Year:   Transportation Needs:   . Film/video editor (Medical):   Marland Kitchen Lack of Transportation (Non-Medical):   Physical Activity:   . Days of Exercise per Week:   . Minutes of Exercise per Session:   Stress:   . Feeling of Stress :   Social Connections:   . Frequency of Communication with Friends and Family:   . Frequency of Social Gatherings with Friends and Family:   . Attends Religious Services:   . Active Member of Clubs or Organizations:   . Attends Archivist Meetings:   Marland Kitchen Marital Status:     Vitals:   01/30/20 0710  BP: 124/82  Pulse: 60  Resp: 12  Temp: 97.7 F (36.5 C)  SpO2: 99%   Body mass index is 35.99 kg/m.  Wt Readings from Last 3 Encounters:  01/30/20 223 lb (101.2 kg)  10/30/19 227 lb (103 kg)  09/22/19 220 lb (99.8 kg)   Physical Exam  Nursing note and vitals reviewed. Constitutional: She is oriented to person, place, and time. She appears well-developed. No distress.  HENT:  Head: Normocephalic and atraumatic.  Right Ear: Hearing, tympanic membrane, external ear and ear canal normal.  Left Ear: Hearing, tympanic membrane, external ear and ear canal normal.  Mouth/Throat: Uvula is midline. Mucous membranes are moist. Normal dentition. Oropharynx is clear.  Eyes: Pupils are equal, round, and reactive to light. Conjunctivae are normal.  Neck: No tracheal deviation present. No thyromegaly present.  Cardiovascular: Normal rate and regular rhythm.  No murmur heard. Pulses:      Dorsalis pedis pulses are 2+ on the right side and 2+ on the left side.  Respiratory: Effort normal and breath sounds normal. No respiratory distress.  GI: Soft. Normal appearance. She exhibits no mass. There is no  hepatomegaly. There is no abdominal tenderness.  Genitourinary:    Vulva and cervix normal.     Pelvic exam was performed with patient in the knee-chest position.  There is no rash, tenderness or lesion on the right labia. There is no rash, tenderness or lesion on the left labia. Uterus is not enlarged and not tender. Right adnexum displays no mass, no tenderness and no fullness. Left adnexum displays no mass, no tenderness and no fullness.    No vaginal discharge, erythema, tenderness or bleeding.  No erythema, tenderness or bleeding in the vagina.    Genitourinary Comments: Breast: No masses, skin abnormalities, or nipple discharge appreciated bilateral. Pap smear collected.   Musculoskeletal:     Comments: No major deformity or signs of synovitis appreciated.  Lymphadenopathy:    She has no cervical adenopathy. No inguinal adenopathy noted on the right or left side.       Right: No supraclavicular adenopathy present.  Left: No supraclavicular adenopathy present.  Neurological: She is alert and oriented to person, place, and time. No cranial nerve deficit. Coordination and gait normal.  Reflex Scores:      Bicep reflexes are 2+ on the right side and 2+ on the left side.      Patellar reflexes are 2+ on the right side and 2+ on the left side. Skin: Skin is warm. No rash noted. No erythema.  Psychiatric: Her speech is normal. Affect normal. Her mood appears anxious.  Well groomed, good eye contact.   ASSESSMENT AND PLAN:  Ms. Verbie Babic was here today annual physical examination.  Orders Placed This Encounter  Procedures  . Comprehensive metabolic panel  . Lipid panel  . Hemoglobin A1c  . Hepatitis C antibody screen   Lab Results  Component Value Date   HGBA1C 6.4 01/30/2020    Lab Results  Component Value Date   CREATININE 0.85 01/30/2020   BUN 15 01/30/2020   NA 134 (L) 01/30/2020   K 4.9 01/30/2020   CL 104 01/30/2020   CO2 27 01/30/2020   Lab Results    Component Value Date   CHOL 134 01/30/2020   HDL 45.50 01/30/2020   LDLCALC 76 01/30/2020   TRIG 62.0 01/30/2020   CHOLHDL 3 01/30/2020    Routine general medical examination at a health care facility We discussed the importance of regular physical activity and healthy diet for prevention of chronic illness and/or complications. Preventive guidelines reviewed. Vaccination is not up to date. She would like to hold on Tdap. Next CPE in a year.  Encounter for HCV screening test for low risk patient -     Hepatitis C antibody screen  Screening for lipoid disorders -     Lipid panel  Screening for endocrine, metabolic and immunity disorder -     Comprehensive metabolic panel -     Hemoglobin A1c  Cervical cancer screening -     PAP [[place]]  Prediabetes Healthier life style for primary prevention recommended.  Severe obesity (BMI 35.0-35.9 with comorbidity) ([place]) We discussed benefits of wt loss as well as adverse effects of obesity. Consistency with healthy diet and physical activity recommended.  Hypertension, essential, benign BP adequately controlled. No changes in current management. Continue low salt diet.   Return in 6 months (on 07/31/2020) for HTN,wt.   Lino Wickliff G. [place], MD  [facility]. [place] office.  Today you have you routine preventive visit. A few things to remember from today's visit:   Routine general medical examination at a health care facility  Encounter for HCV screening test for low risk patient - Plan: Hepatitis C antibody screen  Screening for lipoid disorders - Plan: Lipid panel  Screening for endocrine, metabolic and immunity disorder - Plan: Comprehensive metabolic panel, Hemoglobin A1c  Cervical cancer screening  If you need refills please call your pharmacy. Do not use My Chart to request refills or for acute issues that need immediate attention.   Please be sure medication list is accurate. If a new problem  present, please set up appointment sooner than planned today.  At least 150 minutes of moderate exercise per week, daily brisk walking for 15-30 min is a good exercise option. Healthy diet low in saturated (animal) fats and sweets and consisting of fresh fruits and vegetables, lean meats such as fish and white chicken and whole grains.  These are some of recommendations for screening depending of age and risk factors:  - Vaccines:  Tdap vaccine every 10 years.  Shingles vaccine recommended at age 62, could be given after 41 years of age but not sure about insurance coverage.   Pneumonia vaccines: Pneumovax at 24. Sometimes Pneumovax is giving earlier if history of smoking, lung disease,diabetes,kidney disease among some.  Screening for diabetes at age 93 and every 3 years.  Cervical cancer prevention:  Pap smear starts at 41 years of age and continues periodically until 41 years old in low risk women. Pap smear every 3 years between 100 and 38 years old. Pap smear every 3-5 years between women 63 and older if pap smear negative and HPV screening negative.   -Breast cancer: Mammogram: There is disagreement between experts about when to start screening in low risk asymptomatic female but recent recommendations are to start screening at 57 and not later than 41 years old , every 1-2 years and after 41 yo q 2 years. Screening is recommended until 40 years old but some women can continue screening depending of healthy issues.  Colon cancer screening: Has been recently changed to 41 yo. Insurance may not cover until you are 41 years old. Screening is recommended until 41 years old.  Cholesterol disorder screening at age 41 and every 3 years.  Also recommended:  1. Dental visit- Brush and floss your teeth twice daily; visit your dentist twice a year. 2. Eye doctor- Get an eye exam at least every 2 years. 3. Helmet use- Always wear a helmet when riding a bicycle, motorcycle, rollerblading or  skateboarding. 4. Safe sex- If you may be exposed to sexually transmitted infections, use a condom. 5. Seat belts- Seat belts can save your live; always wear one. 6. Smoke/Carbon Monoxide detectors- These detectors need to be installed on the appropriate level of your home. Replace batteries at least once a year. 7. Skin cancer- When out in the sun please cover up and use sunscreen 15 SPF or higher. 8. Violence- If anyone is threatening or hurting you, please tell your healthcare provider.  9. Drink alcohol in moderation- Limit alcohol intake to one drink or less per day. Never drink and drive. 10. Calcium supplementation 1000 to 1200 mg daily, ideally through your diet.  Vitamin D supplementation 800 units daily.

## 2020-01-30 NOTE — Assessment & Plan Note (Signed)
We discussed benefits of wt loss as well as adverse effects of obesity. Consistency with healthy diet and physical activity recommended.  

## 2020-01-30 NOTE — Patient Instructions (Addendum)
Today you have you routine preventive visit. A few things to remember from today's visit:   Routine general medical examination at a health care facility  Encounter for HCV screening test for low risk patient - Plan: Hepatitis C antibody screen  Screening for lipoid disorders - Plan: Lipid panel  Screening for endocrine, metabolic and immunity disorder - Plan: Comprehensive metabolic panel, Hemoglobin A1c  Cervical cancer screening  If you need refills please call your pharmacy. Do not use My Chart to request refills or for acute issues that need immediate attention.   Please be sure medication list is accurate. If a new problem present, please set up appointment sooner than planned today.  At least 150 minutes of moderate exercise per week, daily brisk walking for 15-30 min is a good exercise option. Healthy diet low in saturated (animal) fats and sweets and consisting of fresh fruits and vegetables, lean meats such as fish and white chicken and whole grains.  These are some of recommendations for screening depending of age and risk factors:  - Vaccines:  Tdap vaccine every 10 years.  Shingles vaccine recommended at age 35, could be given after 41 years of age but not sure about insurance coverage.   Pneumonia vaccines: Pneumovax at 23. Sometimes Pneumovax is giving earlier if history of smoking, lung disease,diabetes,kidney disease among some.  Screening for diabetes at age 83 and every 3 years.  Cervical cancer prevention:  Pap smear starts at 41 years of age and continues periodically until 41 years old in low risk women. Pap smear every 3 years between 90 and 29 years old. Pap smear every 3-5 years between women 39 and older if pap smear negative and HPV screening negative.   -Breast cancer: Mammogram: There is disagreement between experts about when to start screening in low risk asymptomatic female but recent recommendations are to start screening at 22 and not later  than 41 years old , every 1-2 years and after 41 yo q 2 years. Screening is recommended until 41 years old but some women can continue screening depending of healthy issues.  Colon cancer screening: Has been recently changed to 41 yo. Insurance may not cover until you are 41 years old. Screening is recommended until 41 years old.  Cholesterol disorder screening at age 74 and every 3 years.  Also recommended:  1. Dental visit- Brush and floss your teeth twice daily; visit your dentist twice a year. 2. Eye doctor- Get an eye exam at least every 2 years. 3. Helmet use- Always wear a helmet when riding a bicycle, motorcycle, rollerblading or skateboarding. 4. Safe sex- If you may be exposed to sexually transmitted infections, use a condom. 5. Seat belts- Seat belts can save your live; always wear one. 6. Smoke/Carbon Monoxide detectors- These detectors need to be installed on the appropriate level of your home. Replace batteries at least once a year. 7. Skin cancer- When out in the sun please cover up and use sunscreen 15 SPF or higher. 8. Violence- If anyone is threatening or hurting you, please tell your healthcare provider.  9. Drink alcohol in moderation- Limit alcohol intake to one drink or less per day. Never drink and drive. 10. Calcium supplementation 1000 to 1200 mg daily, ideally through your diet.  Vitamin D supplementation 800 units daily.

## 2020-01-31 LAB — CYTOLOGY - PAP
Chlamydia: NEGATIVE
Comment: NEGATIVE
Comment: NEGATIVE
Comment: NORMAL
Diagnosis: NEGATIVE
Neisseria Gonorrhea: NEGATIVE
Trichomonas: NEGATIVE

## 2020-01-31 LAB — HEPATITIS C ANTIBODY
Hepatitis C Ab: NONREACTIVE
SIGNAL TO CUT-OFF: 0 (ref <1.00)

## 2020-02-06 ENCOUNTER — Telehealth (HOSPITAL_COMMUNITY): Payer: Self-pay | Admitting: *Deleted

## 2020-02-06 NOTE — Telephone Encounter (Signed)
Message from patient stating she wanted to know if she could increase her Ativan to 0.5 mg BID or a mg QD, which she has already done. Since she is now taking two pills a day she is needing a new prescription. Spoke with [facility] to clarify when patient last filled. She filled on 01/19/20 and with current sig should last till 7/27 and has a refill. She does still have an available refill but without a new RX pharm couldn't fill and release it so far ahead of the end date of the first RX. Will alert Dr re her request for a change in dose or in the frequency of the current dose.

## 2020-02-07 ENCOUNTER — Telehealth: Payer: Self-pay | Admitting: Family Medicine

## 2020-02-07 DIAGNOSIS — Z1231 Encounter for screening mammogram for malignant neoplasm of breast: Secondary | ICD-10-CM

## 2020-02-07 NOTE — Telephone Encounter (Signed)
Left message for patient to return call to office. 

## 2020-02-07 NOTE — Telephone Encounter (Signed)
Pt was returning the call to the office

## 2020-02-07 NOTE — Telephone Encounter (Signed)
Left detailed message for patient to return call to office. Did not see in chart where mammogram was ordered.

## 2020-02-07 NOTE — Telephone Encounter (Signed)
Pt is calling in stating that she has not received the information to go and have a mammogram done and would like to see if she can get a call back to let her know where she will be going.

## 2020-02-08 NOTE — Telephone Encounter (Signed)
Okay to place order for mammogram?

## 2020-02-08 NOTE — Addendum Note (Signed)
Addended by: Nathanial Millman E on: 02/08/2020 10:30 AM   Modules accepted: Orders

## 2020-02-08 NOTE — Telephone Encounter (Signed)
She could call and schedule, I do not think referral is needed. But order can be placed. Thanks, BJ

## 2020-02-12 ENCOUNTER — Telehealth (HOSPITAL_COMMUNITY): Payer: Self-pay | Admitting: *Deleted

## 2020-02-12 ENCOUNTER — Other Ambulatory Visit (HOSPITAL_COMMUNITY): Payer: Self-pay | Admitting: Psychiatry

## 2020-02-12 DIAGNOSIS — F419 Anxiety disorder, unspecified: Secondary | ICD-10-CM

## 2020-02-12 MED ORDER — HYDROXYZINE HCL 25 MG PO TABS: 25.0000 mg | ORAL_TABLET | Freq: Two times a day (BID) | ORAL | 1 refills | Status: DC | PRN

## 2020-02-12 NOTE — Telephone Encounter (Signed)
At this time provider will not refill Ativan 0.5 mg BID. It is recommended that patient follow up with her outpatient psychiatrist. To help with anxiety provider order hydroxyzine 25 mg BID as needed.

## 2020-02-12 NOTE — Telephone Encounter (Signed)
Called her back in response to message she left for writer on fri 7/1. She increased her Ativan to BID so she is out of it now. Brittney NP only provider here today and she declined to refill the Ativan but offered to call RX in for Vistaril 25 mg BID. Her return appt is with Dr Toy Care on the 21st of July. Discussed with her how to take it and the side affect of dry mouth which she may experience. She agreed to try it and NP notified to call it in.

## 2020-02-12 NOTE — Telephone Encounter (Signed)
Patient has called  & LVM Concerning the following message previously sent: Note that patient has already increased her dosage w/o consent  ::: Message from patient stating she wanted to know if she could increase her Ativan to 0.5 mg BID or a mg QD, which she has already done. Since she is now taking two pills a day she is needing a new prescription. [facility] to [facility] when patient last filled. She filled on 01/19/20 and with current sig should last till 7/27 and has a refill. She does still have an available refill but without a new RX pharm couldn't fill and release it so far ahead of the end date of the first RX. Will alert Dr re her request for a change in dose or in the frequency of the current dose

## 2020-02-26 NOTE — Telephone Encounter (Signed)
At this time provider will not refill Ativan 0.5 mg BID. It is recommended that patient follow up with her outpatient psychiatrist. To help with anxiety provider order hydroxyzine 25 mg BID as needed

## 2020-02-27 ENCOUNTER — Encounter (HOSPITAL_COMMUNITY): Payer: Self-pay | Admitting: Psychiatry

## 2020-02-27 ENCOUNTER — Other Ambulatory Visit: Payer: Self-pay

## 2020-02-27 ENCOUNTER — Telehealth (INDEPENDENT_AMBULATORY_CARE_PROVIDER_SITE_OTHER): Payer: 59 | Admitting: Psychiatry

## 2020-02-27 DIAGNOSIS — F3181 Bipolar II disorder: Secondary | ICD-10-CM | POA: Diagnosis not present

## 2020-02-27 DIAGNOSIS — F419 Anxiety disorder, unspecified: Secondary | ICD-10-CM | POA: Diagnosis not present

## 2020-02-27 MED ORDER — LORAZEPAM 0.5 MG PO TABS: 0.5000 mg | ORAL_TABLET | Freq: Two times a day (BID) | ORAL | 1 refills | Status: DC | PRN

## 2020-02-27 NOTE — Progress Notes (Signed)
[facility] Note  Virtual Visit via Video Note  I connected with Sue Jackson on 02/27/20 at  8:30 AM EDT by a video enabled telemedicine application and verified that I am speaking with the correct person using two identifiers.  Location: Patient: Mother's home in [place] Provider: Clinic   I discussed the limitations of evaluation and management by telemedicine and the availability of in person appointments. The patient expressed understanding and agreed to proceed.  I provided 17 minutes of non-face-to-face time during this encounter.    Patient Identification: Sue Jackson MRN:  [id number] Date of Evaluation:  02/27/2020    Chief Complaint:   " I feel the lorazepam is helping a lot."  Visit Diagnosis:    ICD-10-CM   1. History of Bipolar 2 disorder ([place]), now in remission  F31.81   2. Anxiety  F41.9 LORazepam (ATIVAN) 0.5 MG tablet    History of Present Illness: Patient reported that she is currently in [place] and her mother's place for a vacation.  She informed that overall things are going well however she really wants to continue taking lorazepam.  She stated that hydroxyzine did not help her much and when she switched to lorazepam she found it to be very helpful.  She stated that she started taking it regularly twice a day and since only 45 pills were called and she ran out of it.  She stated that she is taking the hydroxyzine and the mean time while waiting for her next prescription for lorazepam to be filled. Patient stated that overall she is in a better place her life and would really appreciate if we continue the lorazepam regularly twice a day.   Past Psychiatric History: Bipolar depression, anxiety.  History of 1 prior psychiatry hospitalization in 2013 after overdosing on Effexor.  Previous Psychotropic Medications: Yes , Effexor-overdosed, did not like the side effects, Prozac-did not help much, Zoloft-made her feel "crazy", lithium-help with mood but did not  like the way it made her feel, Seroquel-cannot recall if she took it regularly.  Substance Abuse History in the last 12 months:  No.  Consequences of Substance Abuse: NA  Past Medical History:  Past Medical History:  Diagnosis Date  . Anxiety   . Arthritis   . Depression   . Hypertension    No past surgical history on file.  Family Psychiatric History: Maternal aunt- depression  Family History:  Family History  Problem Relation Age of Onset  . Arthritis Mother   . Asthma Mother   . Cancer Maternal Aunt        breast  . Depression Maternal Aunt   . Hypertension Maternal Aunt   . Diabetes Maternal Grandfather   . Heart disease Maternal Grandfather   . Hypertension Maternal Grandfather   . Stroke Maternal Grandfather     Social History:   Social History   Socioeconomic History  . Marital status: Married    Spouse name: Not on file  . Number of children: Not on file  . Years of education: Not on file  . Highest education level: Not on file  Occupational History  . Not on file  Tobacco Use  . Smoking status: Former Research scientist (life sciences)  . Smokeless tobacco: Never Used  Substance and Sexual Activity  . Alcohol use: Yes    Comment: occasionally  . Drug use: No  . Sexual activity: Yes    Birth control/protection: None  Other Topics Concern  . Not on file  Social History Narrative  .  Not on file   Social Determinants of Health   Financial Resource Strain:   . Difficulty of Paying Living Expenses:   Food Insecurity:   . Worried About Charity fundraiser in the Last Year:   . Arboriculturist in the Last Year:   Transportation Needs:   . Film/video editor (Medical):   Marland Kitchen Lack of Transportation (Non-Medical):   Physical Activity:   . Days of Exercise per Week:   . Minutes of Exercise per Session:   Stress:   . Feeling of Stress :   Social Connections:   . Frequency of Communication with Friends and Family:   . Frequency of Social Gatherings with Friends and Family:    . Attends Religious Services:   . Active Member of Clubs or Organizations:   . 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  Lives with her partner/wife.  Does not have any children.  Allergies:  No Known Allergies  Metabolic Disorder Labs: Lab Results  Component Value Date   HGBA1C 6.4 01/30/2020   No results found for: PROLACTIN Lab Results  Component Value Date   CHOL 134 01/30/2020   TRIG 62.0 01/30/2020   HDL 45.50 01/30/2020   CHOLHDL 3 01/30/2020   VLDL 12.4 01/30/2020   LDLCALC 76 01/30/2020   LDLCALC 95 03/11/2017   Lab Results  Component Value Date   TSH 2.51 11/21/2018    Therapeutic Level Labs: No results found for: LITHIUM No results found for: CBMZ No results found for: VALPROATE  Current Medications: Current Outpatient Medications  Medication Sig Dispense Refill  . amLODipine-benazepril (LOTREL) 5-10 MG capsule TAKE 1 CAPSULE BY MOUTH EVERY DAY 90 capsule 2  . fluticasone (FLONASE) 50 MCG/ACT nasal spray Place 1 spray into both nostrils 2 (two) times daily. 16 g 2  . hydrOXYzine (ATARAX/VISTARIL) 25 MG tablet Take 1 tablet (25 mg total) by mouth 2 (two) times daily as needed. 60 tablet 1  . LORazepam (ATIVAN) 0.5 MG tablet Take 1 tablet (0.5 mg total) by mouth 2 (two) times daily as needed for anxiety. 60 tablet 1  . meclizine (ANTIVERT) 25 MG tablet Take 1 tablet (25 mg total) by mouth 2 (two) times daily as needed for dizziness. 30 tablet 0  . propranolol (INDERAL) 40 MG tablet Take 0.5 tablets (20 mg total) by mouth daily. 90 tablet 2   No current facility-administered medications for this visit.    Musculoskeletal: Strength & Muscle Tone: unable to assess due to telemed visit Gait & Station: unable to assess due to telemed visit Patient leans: unable to assess due to telemed visit   Psychiatric Specialty Exam: Review of Systems  There  were no vitals taken for this visit.There is no height or weight on file to calculate BMI.  General Appearance: Fairly Groomed  Eye Contact:  Good  Speech:  Clear and Coherent and Normal Rate  Volume:  Normal  Mood:  Anxious  Affect:  Congruent  Thought Process:  Goal Directed and Descriptions of Associations: Intact  Orientation:  Full (Time, Place, and Person)  Thought Content:  Logical, focused on Lorazepam prescription refill  Suicidal Thoughts:  No  Homicidal Thoughts:  No  Memory:  Immediate;   Good Recent;   Good Remote;   Good  Judgement:  Fair  Insight:  Fair  Psychomotor Activity:  Normal  Concentration:  Concentration: Good and Attention Span: Good  Recall:  Good  Fund of Knowledge:Good  Language: Good  Akathisia:  Negative  Handed:  Right  AIMS (if indicated):  not done  Assets:  Communication Skills Desire for Improvement Financial Resources/Insurance Housing Social Support Vocational/Educational  ADL's:  Intact  Cognition: WNL  Sleep:  Good    Assessment and Plan: Patient stated that lorazepam 0.5 mg twice a day has been very helpful in controlling her anxiety and she would appreciate if we continue that.  She denied any other concerns at this time.  1. Anxiety - LORazepam (ATIVAN) 0.5 MG tablet; Take 1 tablet (0.5 mg total) by mouth 2 (two) times daily as needed for anxiety.  Dispense: 60 tablet; Refill: 1 Patient was informed that since lorazepam is in a controlled substance it cannot be E prescribed to pharmacy in [place] and that will be sent to her regular pharmacy in [place].  Patient verbalized understanding.  2. History of Bipolar 2 disorder ([place]), now in remission  F/up in 8 weeks.   [place] Crane, MD 7/21/20218:44 AM

## 2020-03-06 ENCOUNTER — Telehealth (HOSPITAL_COMMUNITY): Payer: Self-pay | Admitting: Psychiatry

## 2020-03-06 ENCOUNTER — Ambulatory Visit: Payer: 59

## 2020-03-06 NOTE — Telephone Encounter (Signed)
SPOKE WITH PATIENT TO INFORM THAT NEW SCRIPT HAS BEEN SENT ON 02/27/20

## 2020-03-06 NOTE — Telephone Encounter (Signed)
I am not sure what she is referring to, a new prescription stating Lorazepam 0.5 mg twice daily was sent to her pharmacy on 7/21. [name], can you help, thanks.

## 2020-03-12 ENCOUNTER — Ambulatory Visit
Admission: RE | Admit: 2020-03-12 | Discharge: 2020-03-12 | Disposition: A | Discharge status: Home / Self Care | Payer: 59 | Source: Ambulatory Visit | Attending: Family Medicine | Admitting: Family Medicine

## 2020-03-12 ENCOUNTER — Other Ambulatory Visit: Payer: Self-pay

## 2020-03-12 DIAGNOSIS — Z1231 Encounter for screening mammogram for malignant neoplasm of breast: Secondary | ICD-10-CM

## 2020-03-12 IMAGING — MG DIGITAL SCREENING BILAT W/ TOMO W/ CAD
5 series · 6 of 17 positions shown · non-contrast
Comparison: None.

CLINICAL DATA: Screening.

EXAM:
DIGITAL SCREENING BILATERAL MAMMOGRAM WITH TOMO AND CAD

[R CC synth-2D]
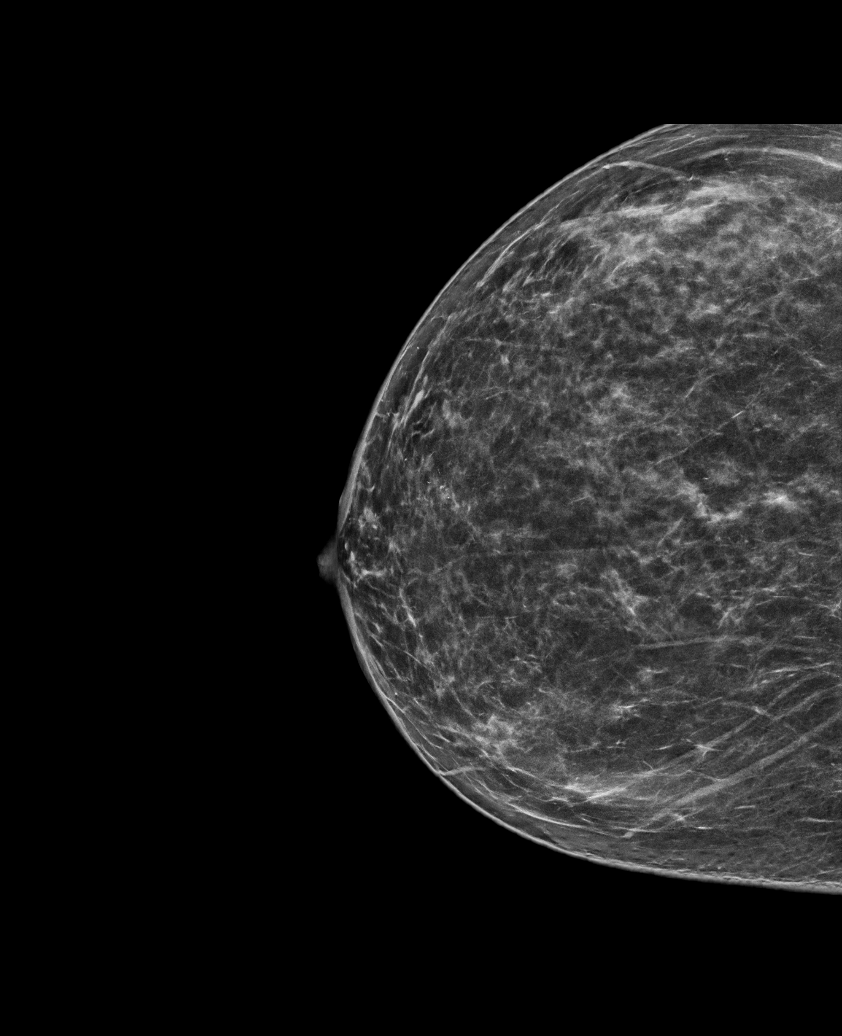

[L CC synth-2D]
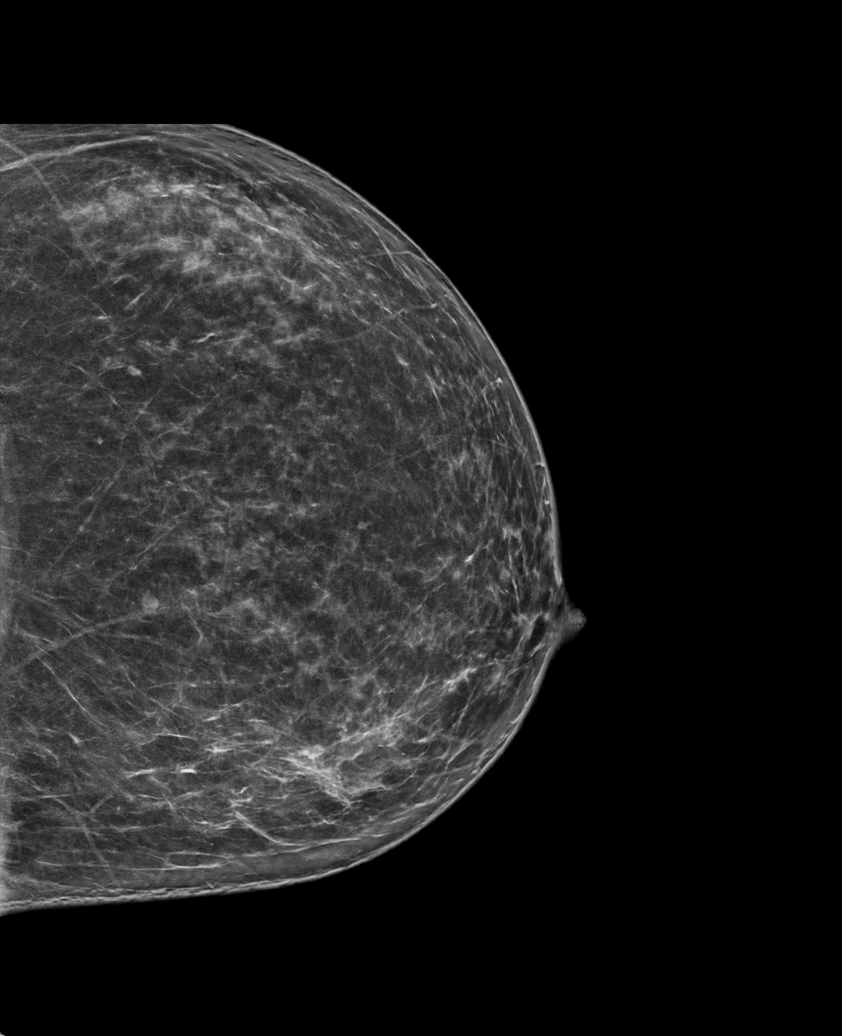

[R MLO tomo · 2 of 85 frames shown]
[frame 28/85]
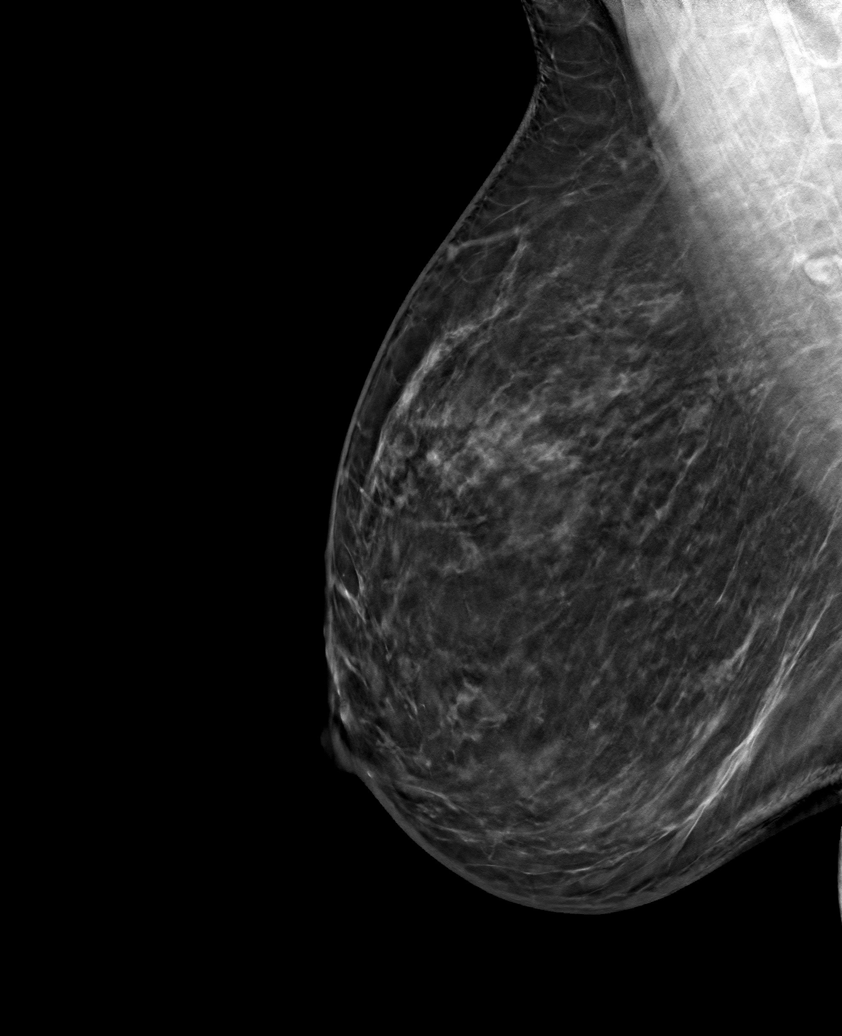
[frame 43/85]
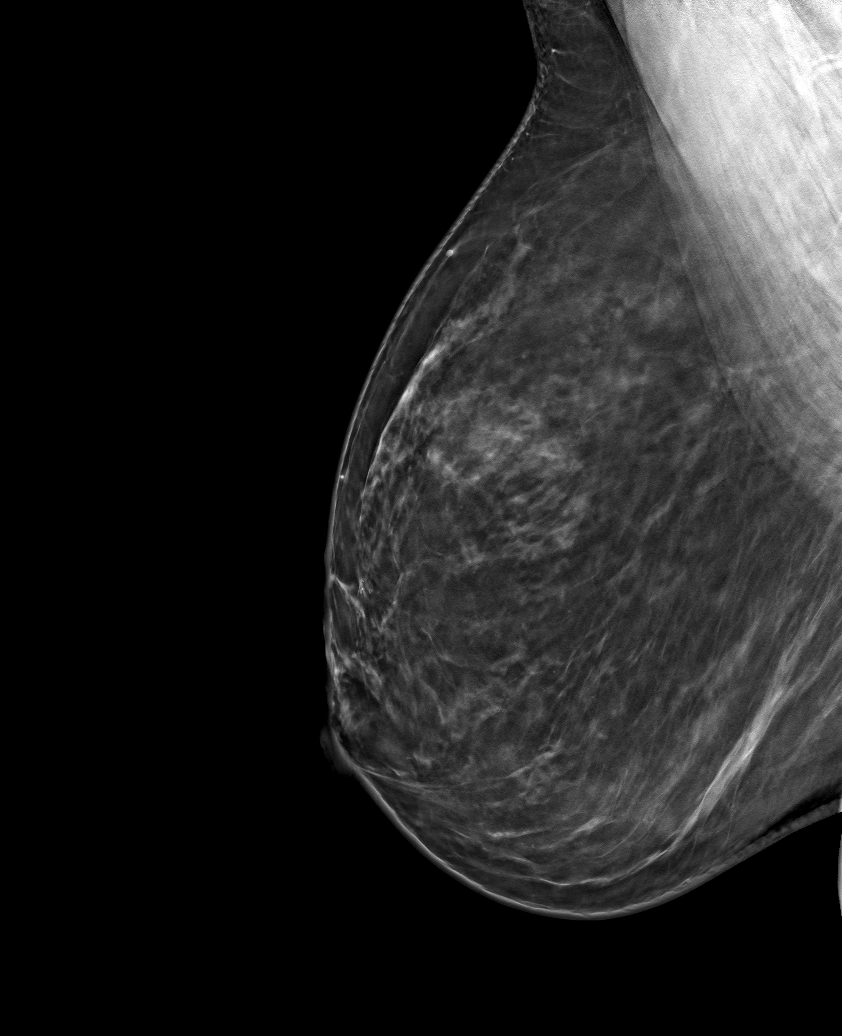

[L CC tomo · tomo slice 39/76.0]
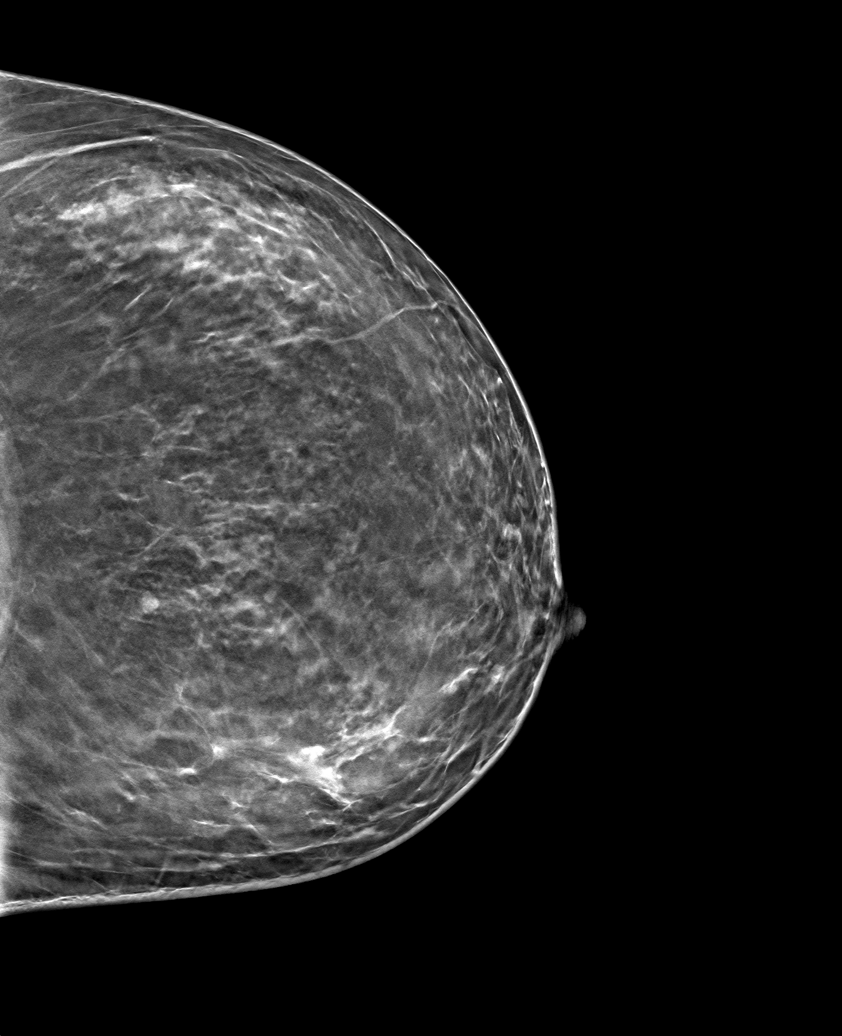

[L MLO tomo · tomo slice 47/94.0]
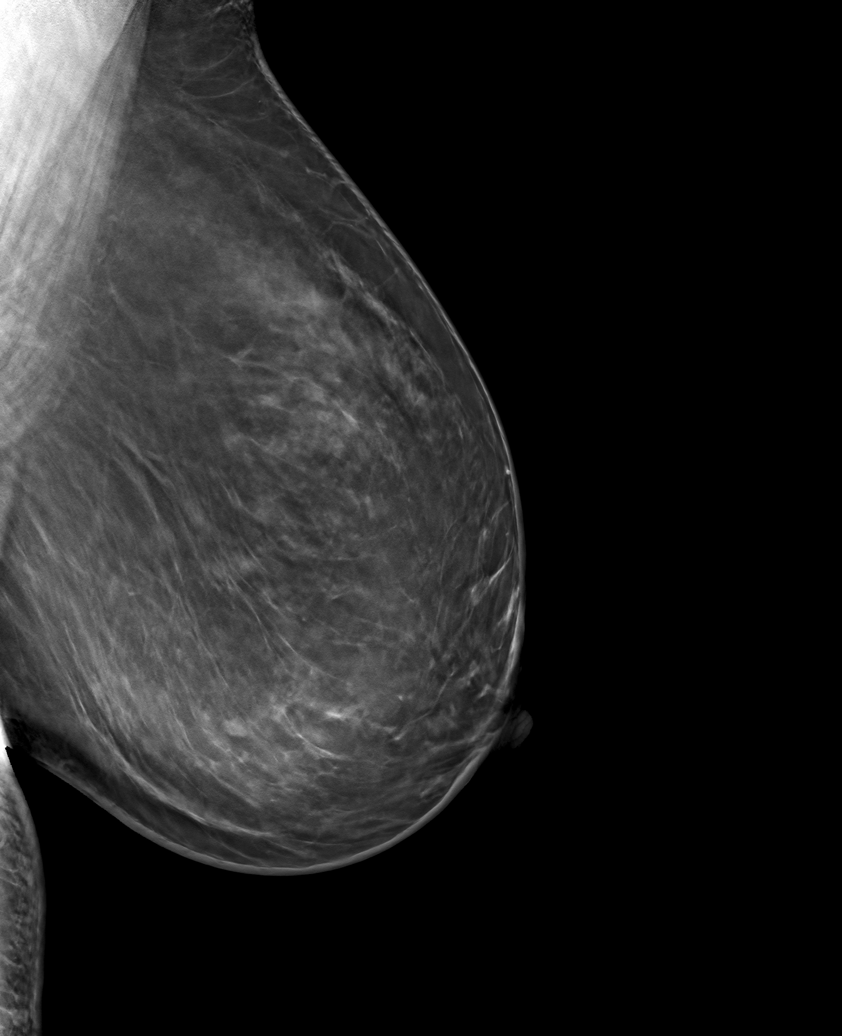

[6 of 17 positions shown; findings below may reference images not displayed]

ACR Breast Density Category b: There are scattered areas of
fibroglandular density.
FINDINGS: In the right breast calcifications requires further evaluation.

In the left breast asymmetries require further evaluation.

Images were processed with CAD.
IMPRESSION: Further evaluation is suggested for possible calcifications in the
right breast.

Further evaluation is suggested for possible asymmetries in the left
breast.

RECOMMENDATION:
Diagnostic mammogram and possibly ultrasound of both breasts.
(Code:[96])

The patient will be contacted regarding the findings, and additional
imaging will be scheduled.

BI-RADS CATEGORY  0: Incomplete. Need additional imaging evaluation
and/or prior mammograms for comparison.

## 2020-03-17 ENCOUNTER — Other Ambulatory Visit: Payer: Self-pay | Admitting: Family Medicine

## 2020-03-17 DIAGNOSIS — R928 Other abnormal and inconclusive findings on diagnostic imaging of breast: Secondary | ICD-10-CM

## 2020-04-01 ENCOUNTER — Other Ambulatory Visit: Payer: Self-pay

## 2020-04-01 ENCOUNTER — Ambulatory Visit
Admission: RE | Admit: 2020-04-01 | Discharge: 2020-04-01 | Disposition: A | Discharge status: Home / Self Care | Payer: 59 | Source: Ambulatory Visit | Attending: Family Medicine | Admitting: Family Medicine

## 2020-04-01 ENCOUNTER — Other Ambulatory Visit: Payer: Self-pay | Admitting: Family Medicine

## 2020-04-01 ENCOUNTER — Ambulatory Visit: Payer: 59

## 2020-04-01 DIAGNOSIS — R928 Other abnormal and inconclusive findings on diagnostic imaging of breast: Secondary | ICD-10-CM

## 2020-04-01 DIAGNOSIS — R921 Mammographic calcification found on diagnostic imaging of breast: Secondary | ICD-10-CM

## 2020-04-01 IMAGING — US US BREAST*L* LIMITED INC AXILLA
1 series · 11 of 11 positions shown · non-contrast
Comparison: Previous exam(s).

CLINICAL DATA: 39-year-old female recalled from baseline screening
mammogram for right breast calcifications and possible left breast
asymmetries.

EXAM:
DIGITAL DIAGNOSTIC BILATERAL MAMMOGRAM WITH CAD AND TOMO
ULTRASOUND LEFT BREAST

[Series 1: us breast*left* limited inc axilla · 0.07mm/px · 11 of 11 slices shown]
[im 1/11]
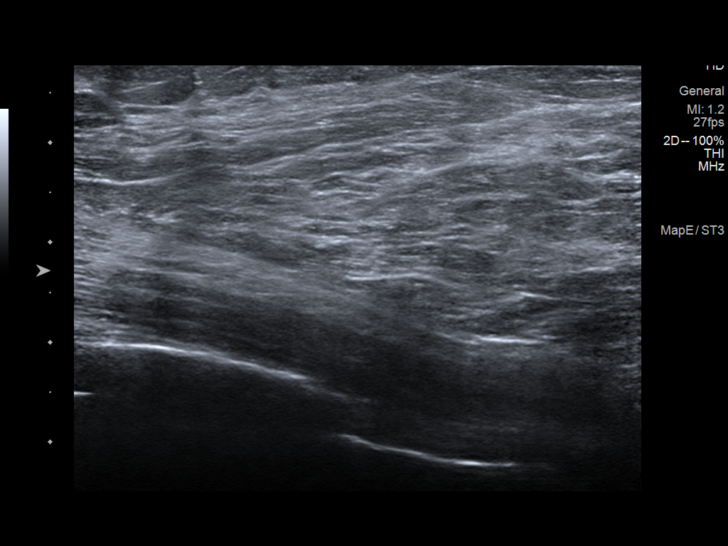
[im 2/11]
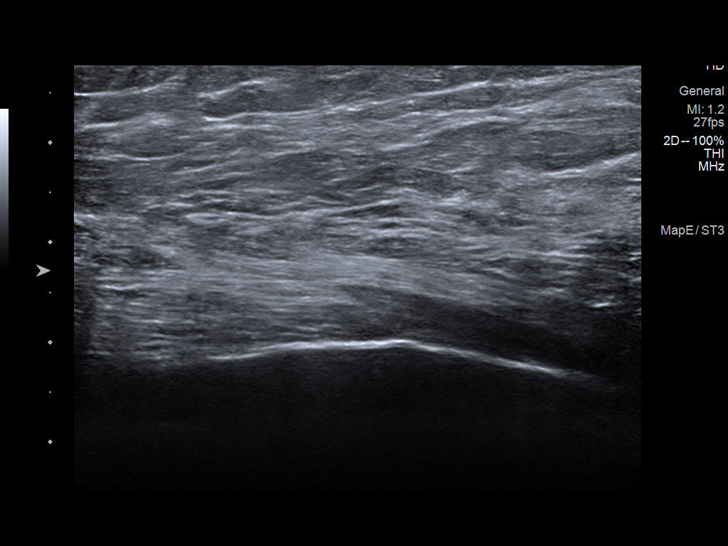
[im 3/11]
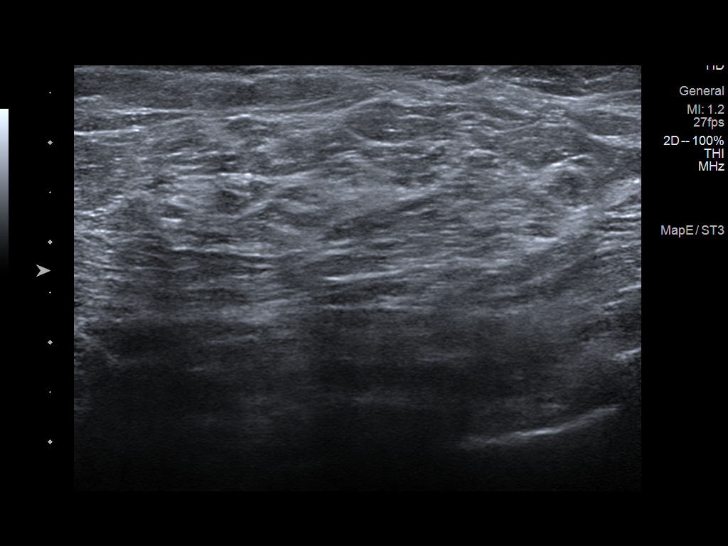
[im 4/11]
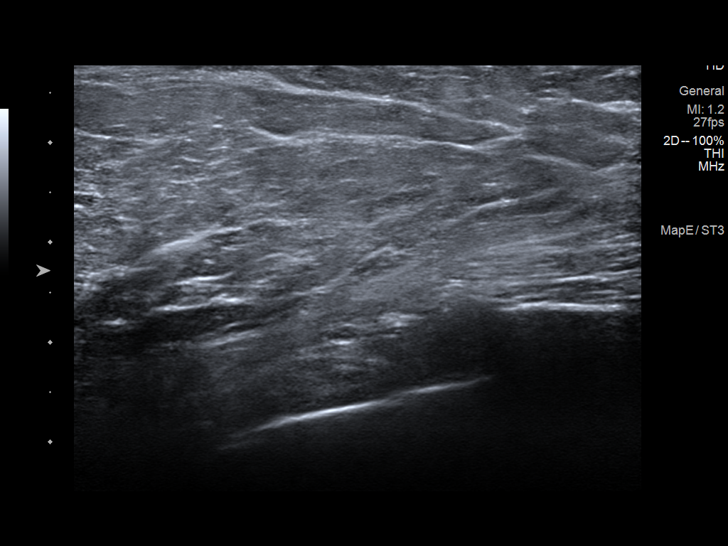
[im 5/11]
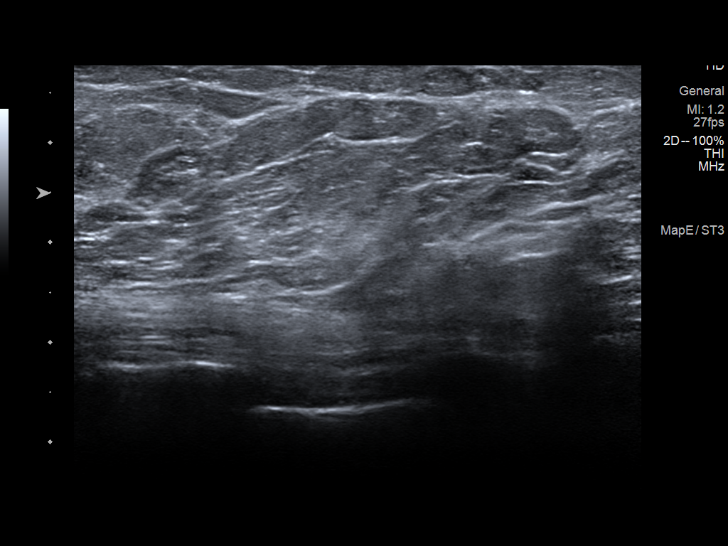
[im 6/11]
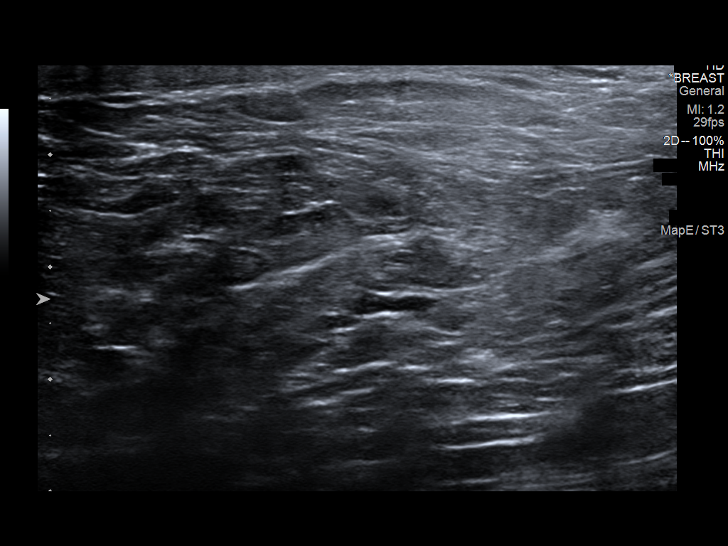
[im 7/11]
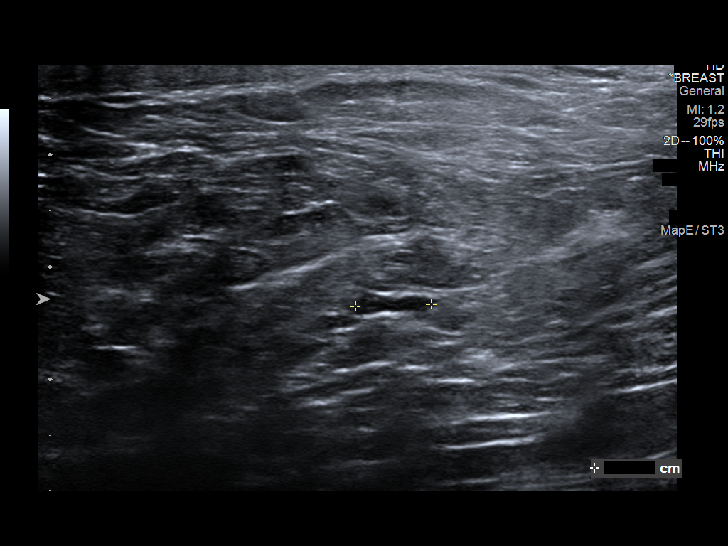
[im 8/11]
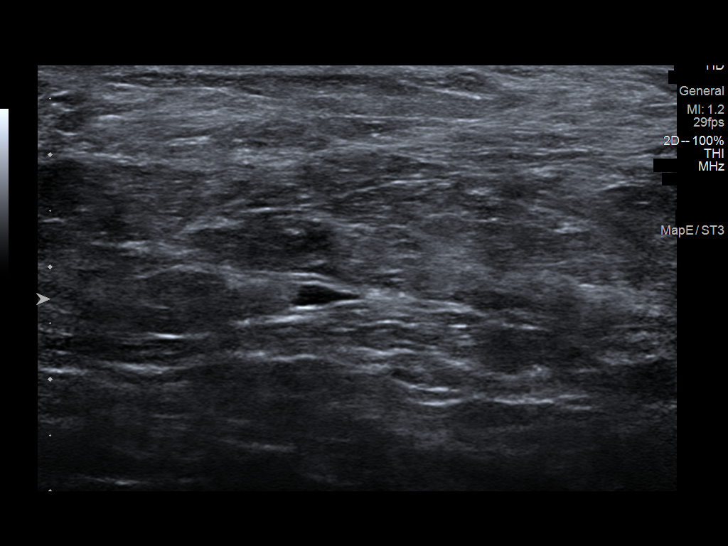
[im 9/11]
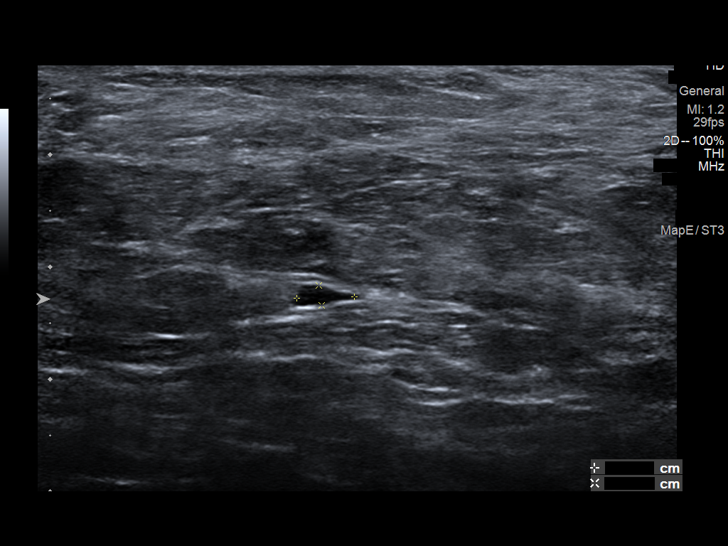
[im 10/11]
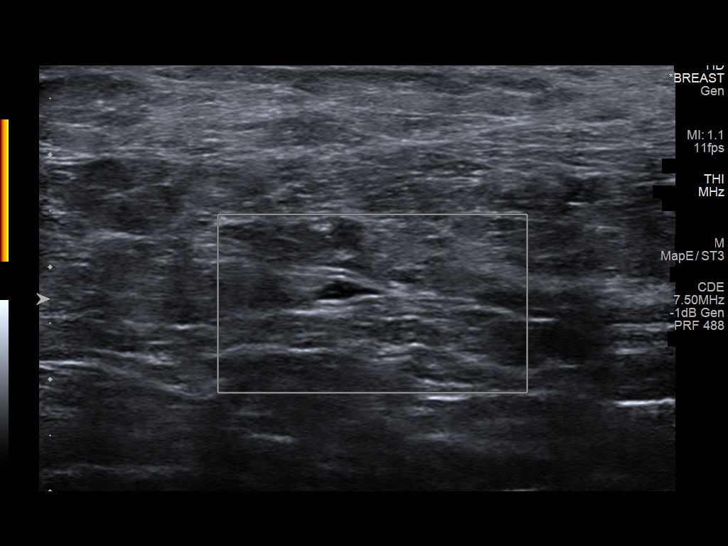
[im 11/11]
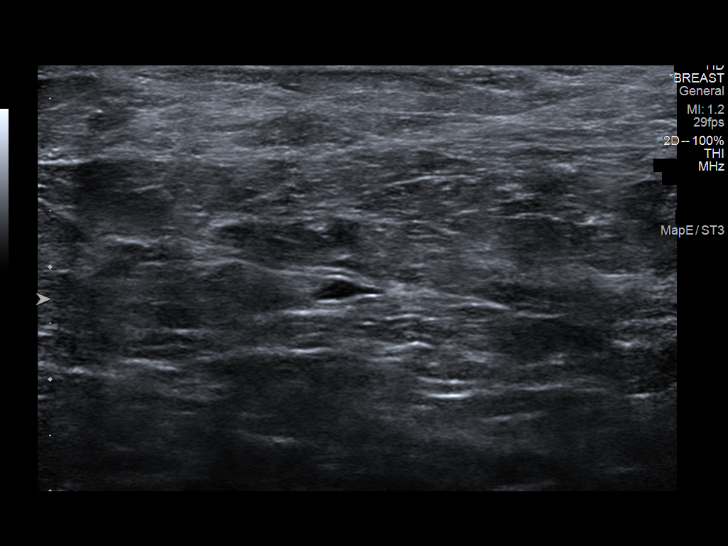

[11 of 11 positions shown; findings below may reference images not displayed]

ACR Breast Density Category b: There are scattered areas of
fibroglandular density.
FINDINGS: A 9 mm group of pleomorphic calcifications is demonstrated in the
superior central right breast at middle depth.

There are persistent oval, circumscribed equal density masses in the
upper outer and lower central left breast. The mass in the upper
outer quadrant demonstrates a possible hilar notch, suggesting an
intramammary lymph node. Further evaluation with ultrasound was
performed.

Mammographic images were processed with CAD.

Targeted ultrasound is performed, showing normal fibroglandular
tissue without focal or suspicious sonographic abnormality in the
entire upper outer quadrant of the left breast. An oval,
circumscribed hypoechoic mass identified at the 4 o'clock
retroareolar position. It measures 7 x 5 x 2 mm without internal
vascularity. This may correspond with the mammographic finding in
the lower central left breast.
IMPRESSION: 1. Indeterminate right breast calcifications.
2. Probably benign left breast masses x2, one of which may
correspond with a complicated cyst at the 4 o'clock retroareolar
position.

RECOMMENDATION:
1. Stereotactic biopsy of the right breast.
2. If the above biopsy results demonstrate benign pathology,
recommendation is for six-month mammographic and sonographic
follow-up of the left breast.
3. If the above biopsy results demonstrate malignancy or atypia,
additional biopsies are recommended on the left.

I have discussed the findings and recommendations with the patient.
If applicable, a reminder letter will be sent to the patient
regarding the next appointment.

BI-RADS CATEGORY  4: Suspicious.

## 2020-04-01 IMAGING — MG DIGITAL DIAGNOSTIC BILAT W/ TOMO W/ CAD
8 of 13 series · 8 of 33 positions shown · non-contrast
Comparison: Previous exam(s).

CLINICAL DATA: 39-year-old female recalled from baseline screening
mammogram for right breast calcifications and possible left breast
asymmetries.

EXAM:
DIGITAL DIAGNOSTIC BILATERAL MAMMOGRAM WITH CAD AND TOMO
ULTRASOUND LEFT BREAST

[R CC]
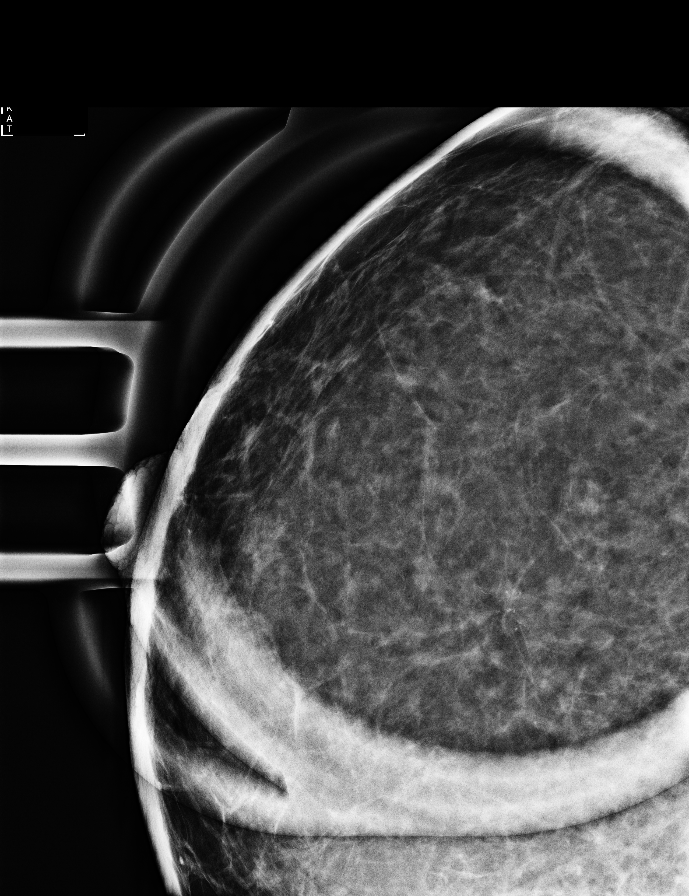

[R ML (1 of 2)]
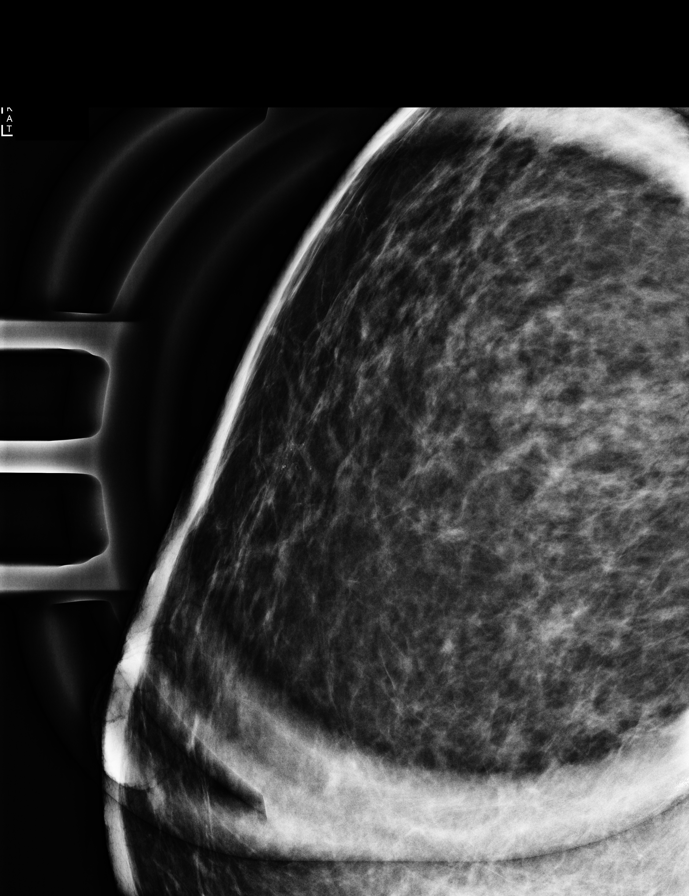

[R ML (2 of 2)]
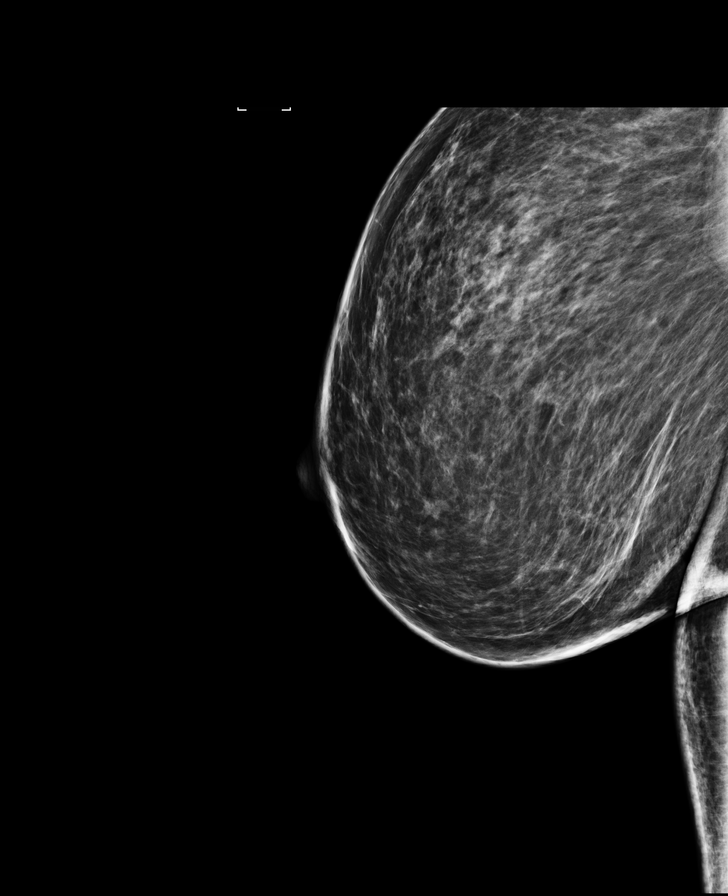

[L MLO synth-2D (1 of 3)]
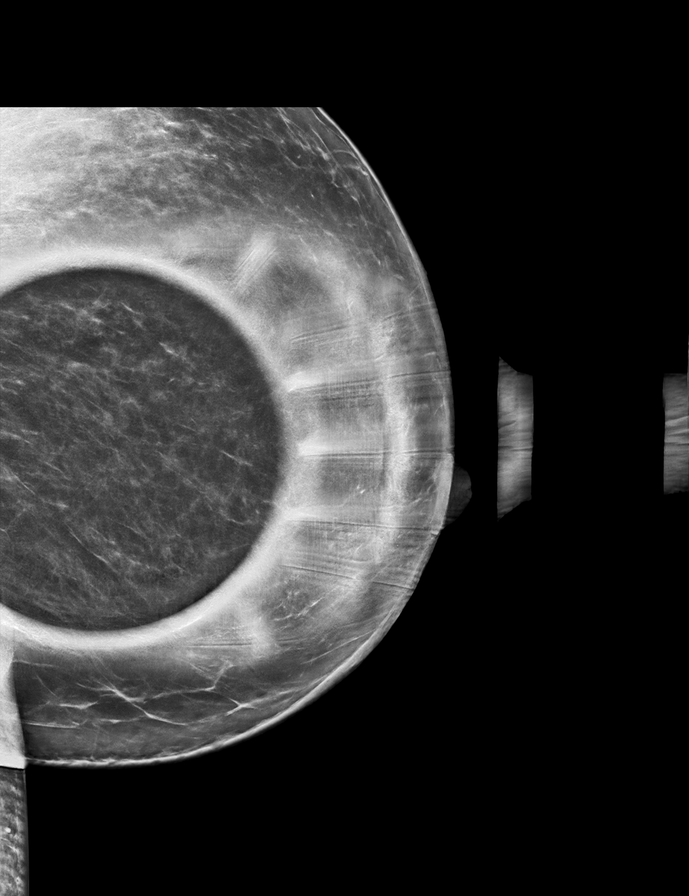

[L MLO synth-2D (2 of 3)]
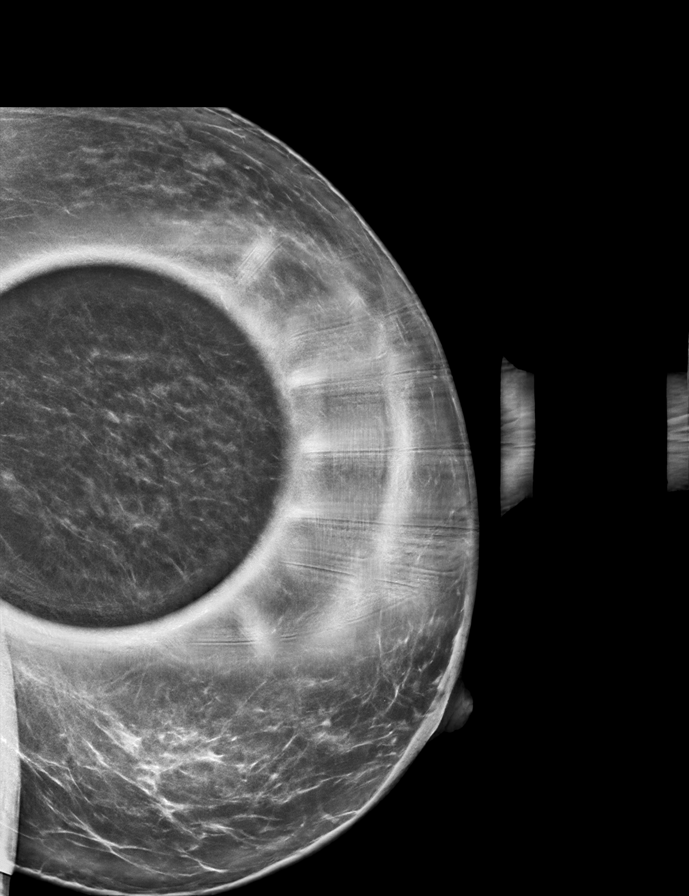

[L CC synth-2D]
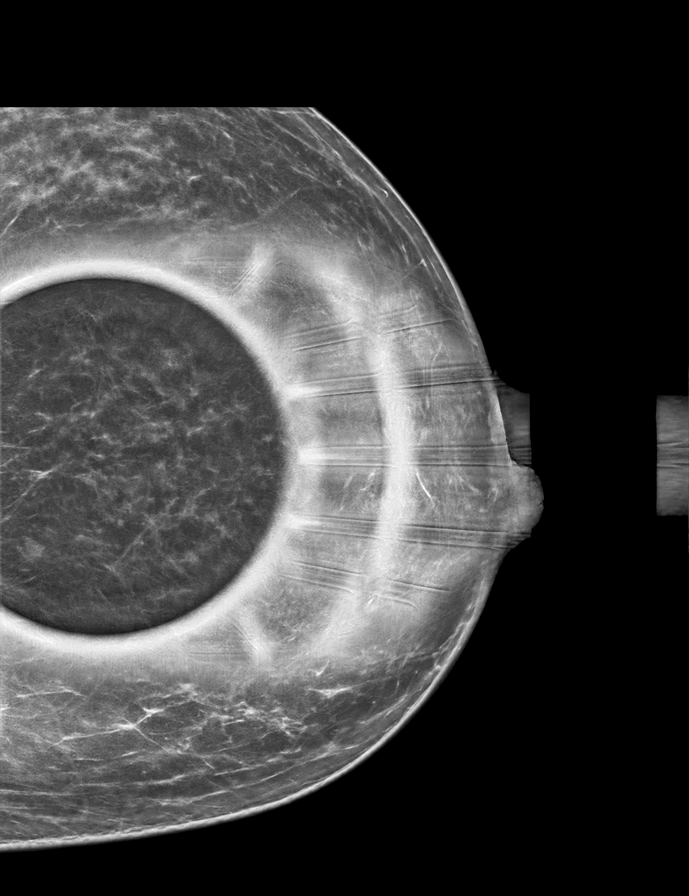

[L MLO synth-2D (3 of 3)]
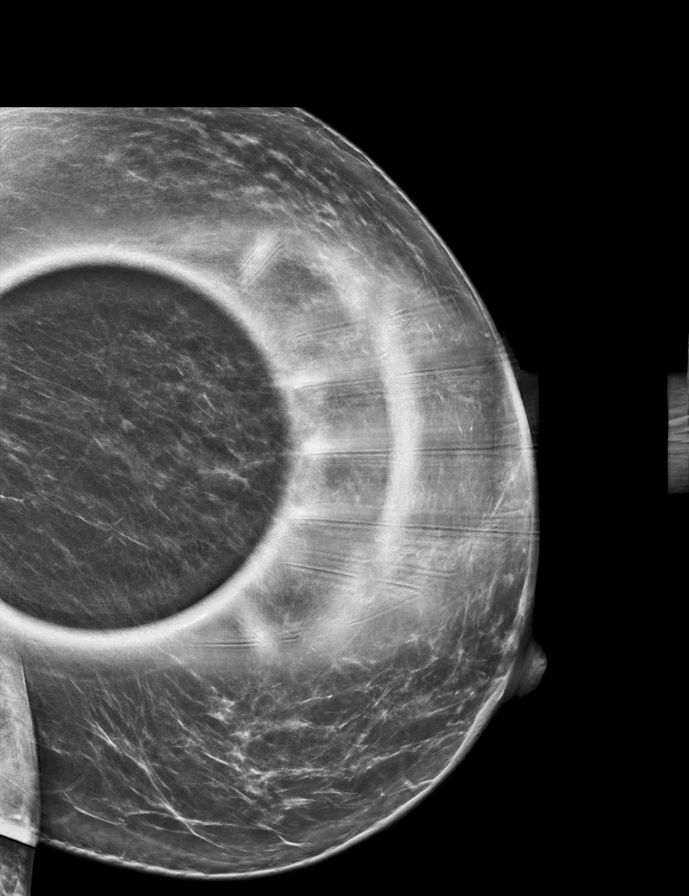

[L ML synth-2D]
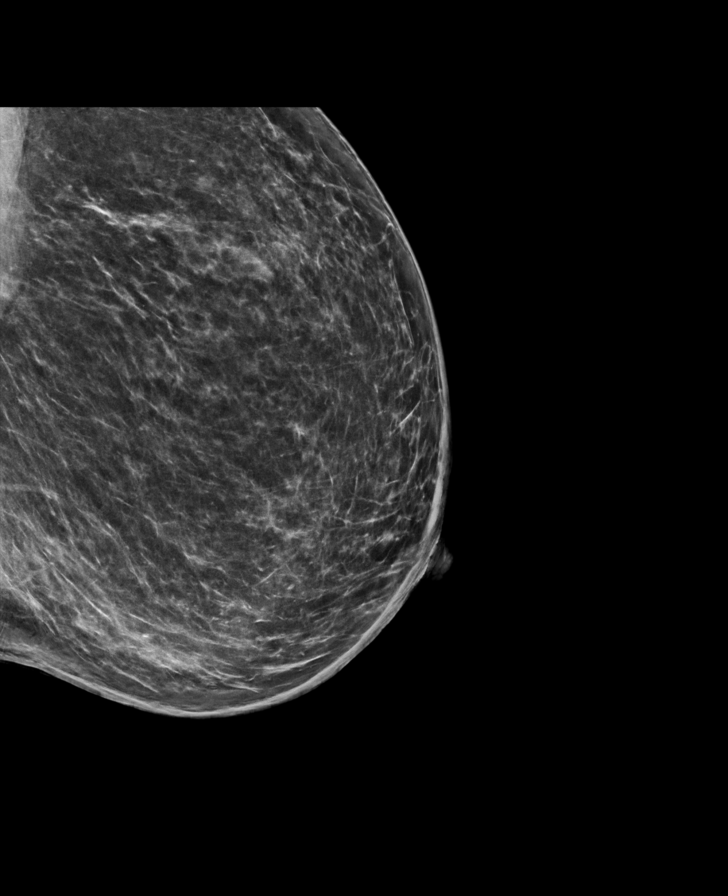

[8 of 33 positions shown; findings below may reference images not displayed]

ACR Breast Density Category b: There are scattered areas of
fibroglandular density.
FINDINGS: A 9 mm group of pleomorphic calcifications is demonstrated in the
superior central right breast at middle depth.

There are persistent oval, circumscribed equal density masses in the
upper outer and lower central left breast. The mass in the upper
outer quadrant demonstrates a possible hilar notch, suggesting an
intramammary lymph node. Further evaluation with ultrasound was
performed.

Mammographic images were processed with CAD.

Targeted ultrasound is performed, showing normal fibroglandular
tissue without focal or suspicious sonographic abnormality in the
entire upper outer quadrant of the left breast. An oval,
circumscribed hypoechoic mass identified at the 4 o'clock
retroareolar position. It measures 7 x 5 x 2 mm without internal
vascularity. This may correspond with the mammographic finding in
the lower central left breast.
IMPRESSION: 1. Indeterminate right breast calcifications.
2. Probably benign left breast masses x2, one of which may
correspond with a complicated cyst at the 4 o'clock retroareolar
position.

RECOMMENDATION:
1. Stereotactic biopsy of the right breast.
2. If the above biopsy results demonstrate benign pathology,
recommendation is for six-month mammographic and sonographic
follow-up of the left breast.
3. If the above biopsy results demonstrate malignancy or atypia,
additional biopsies are recommended on the left.

I have discussed the findings and recommendations with the patient.
If applicable, a reminder letter will be sent to the patient
regarding the next appointment.

BI-RADS CATEGORY  4: Suspicious.

## 2020-04-09 ENCOUNTER — Ambulatory Visit
Admission: RE | Admit: 2020-04-09 | Discharge: 2020-04-09 | Disposition: A | Discharge status: Home / Self Care | Payer: 59 | Source: Ambulatory Visit | Attending: Family Medicine | Admitting: Family Medicine

## 2020-04-09 ENCOUNTER — Other Ambulatory Visit: Payer: Self-pay

## 2020-04-09 DIAGNOSIS — R921 Mammographic calcification found on diagnostic imaging of breast: Secondary | ICD-10-CM

## 2020-04-09 DIAGNOSIS — C50919 Malignant neoplasm of unspecified site of unspecified female breast: Secondary | ICD-10-CM

## 2020-04-09 HISTORY — PX: BREAST BIOPSY: SHX20

## 2020-04-09 HISTORY — DX: Malignant neoplasm of unspecified site of unspecified female breast: C50.919

## 2020-04-09 IMAGING — MG MM BREAST BX W/ LOC DEV 1ST LESION IMAGE BX SPEC STEREO GUIDE*R*
6 of 9 series · 6 of 21 positions shown · non-contrast
Comparison: Previous exams.
COMPARISON: Previous exams.

Addendum:
CLINICAL DATA: Biopsy of right breast calcifications

EXAM:
RIGHT BREAST STEREOTACTIC CORE NEEDLE BIOPSY

[R (1 of 5)]
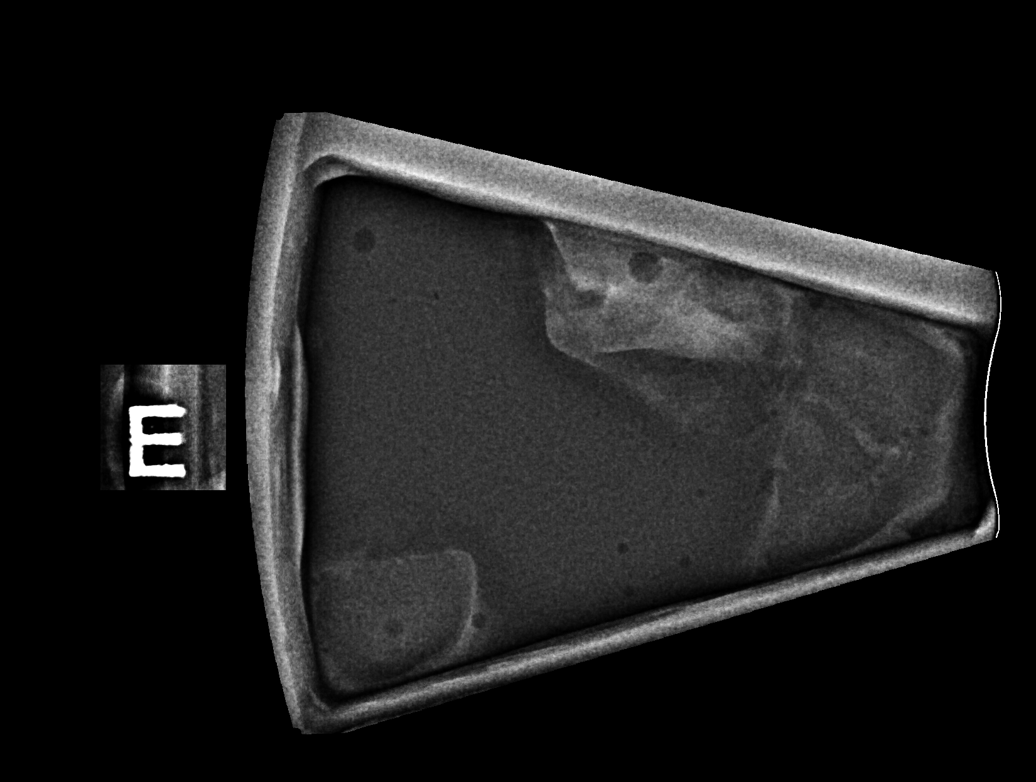

[R (2 of 5)]
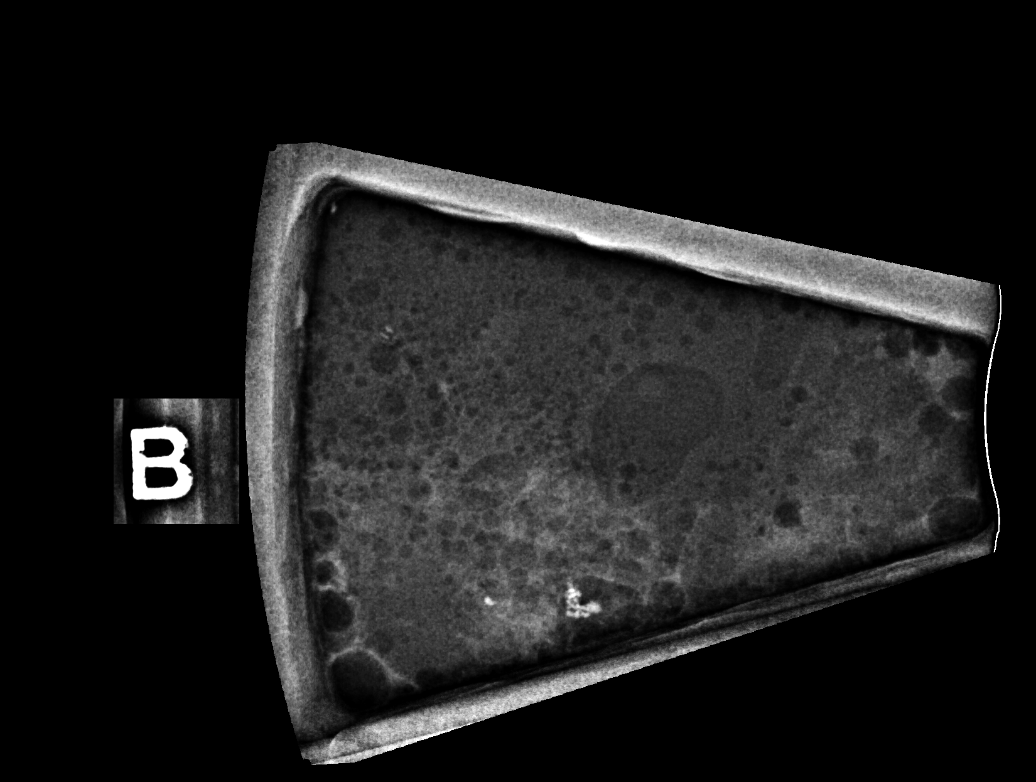

[R (3 of 5)]
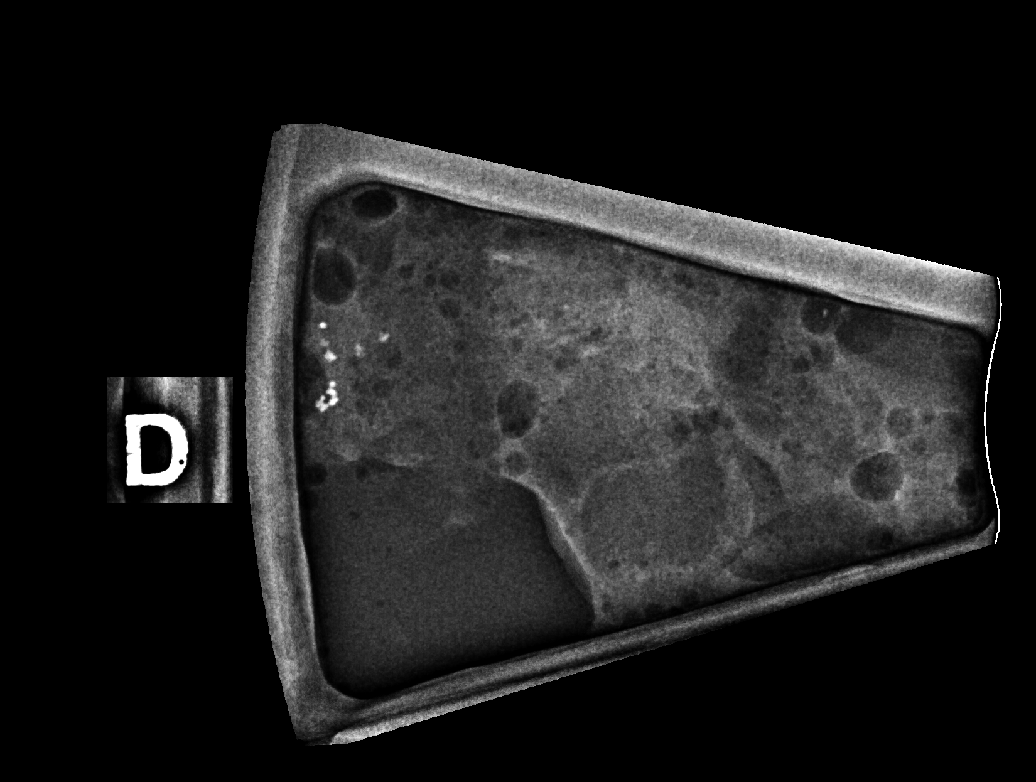

[R (4 of 5)]
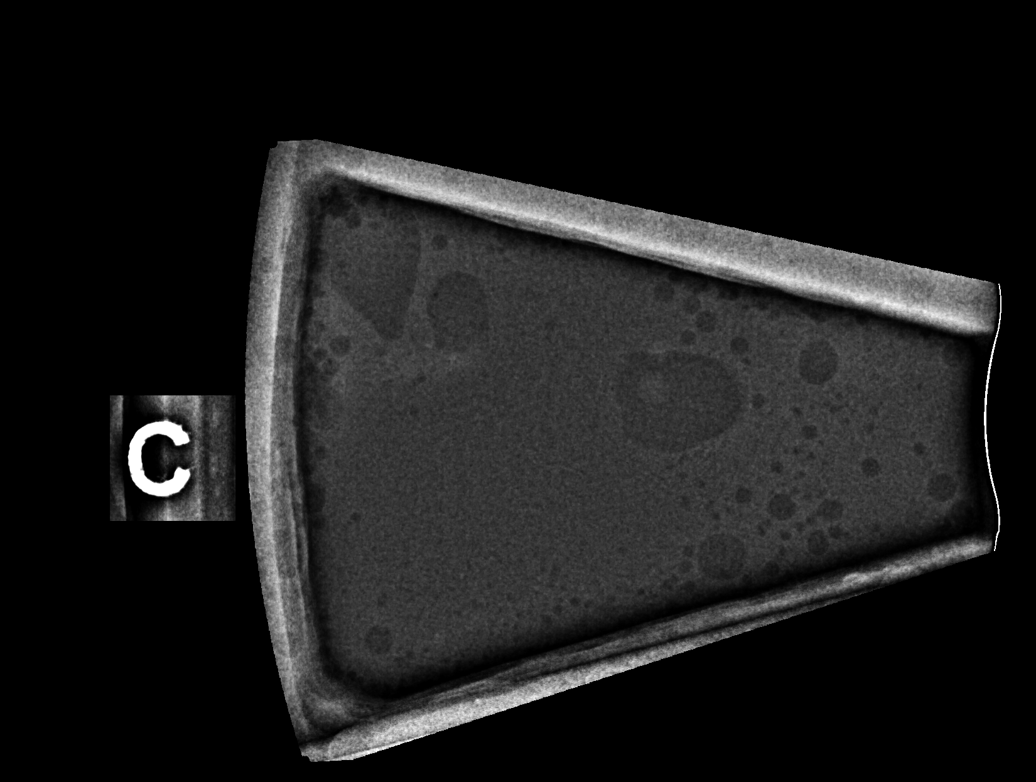

[R (5 of 5)]
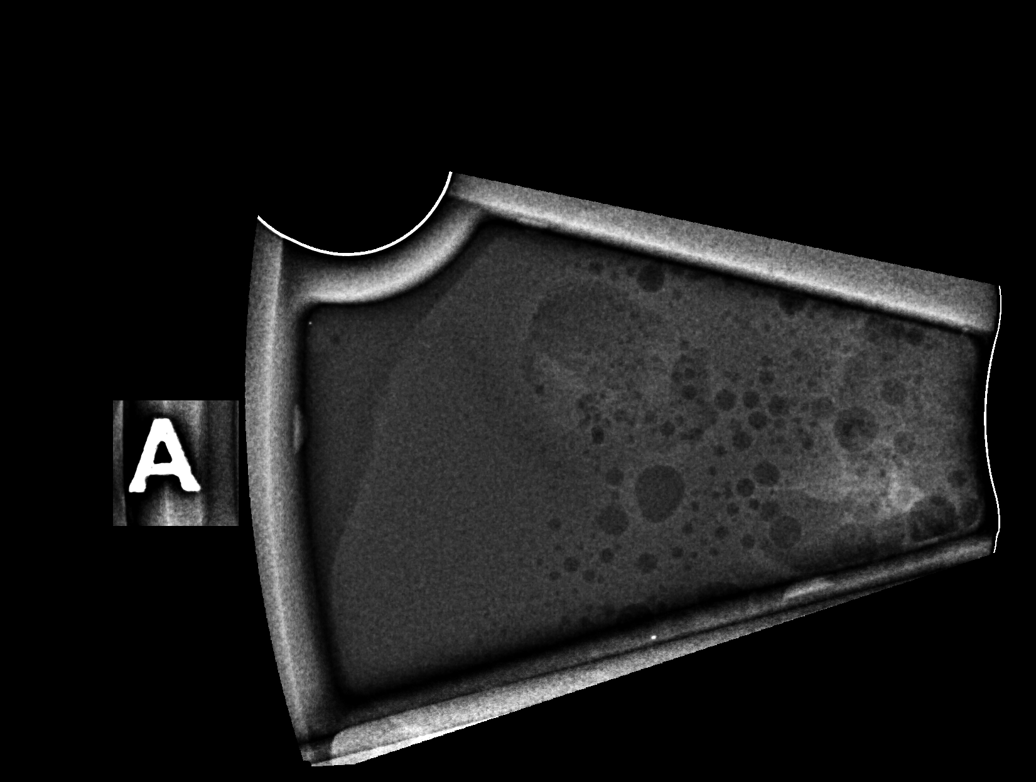

[R CC]
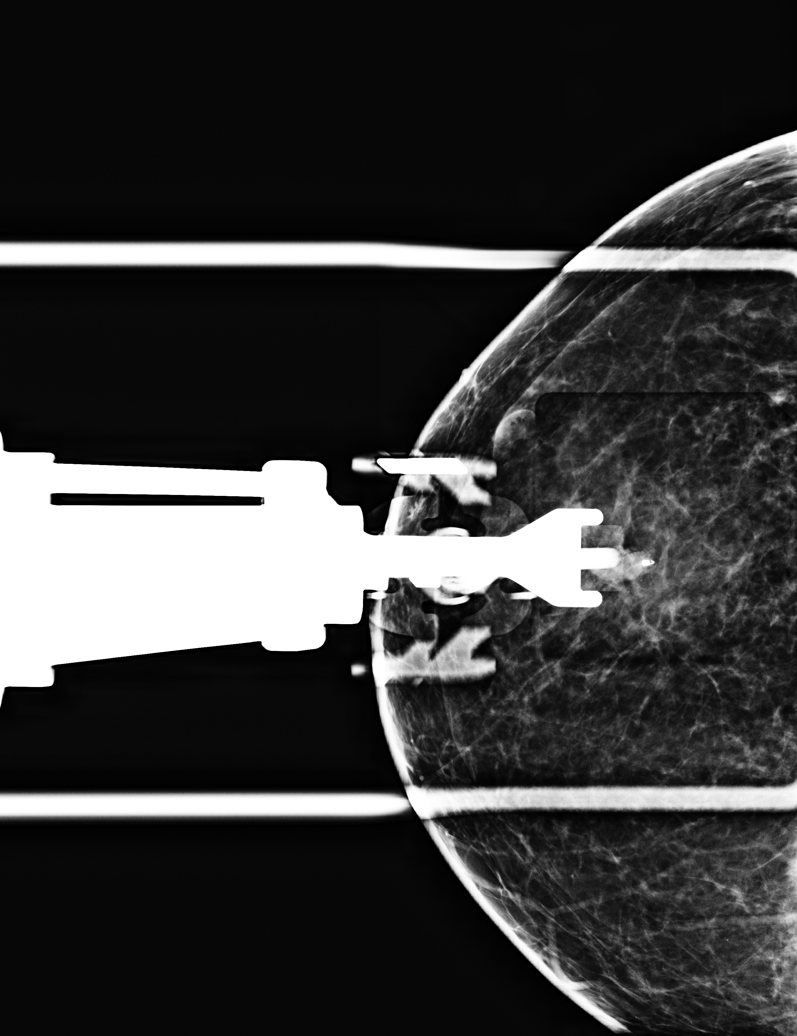

[6 of 21 positions shown; findings below may reference images not displayed]



Using sterile technique and 1% Lidocaine as local anesthetic, under
stereotactic guidance, a 9 gauge vacuum assisted device was used to
perform core needle biopsy of calcifications in the upper outer
right breast using a superior approach. Specimen radiograph was
performed showing calcifications in 3 core specimens. Specimens with
calcifications are identified for pathology.

Lesion quadrant: Upper-outer right

At the conclusion of the procedure, coil shaped tissue marker clip
was deployed into the biopsy cavity. Follow-up 2-view mammogram was
performed and dictated separately.
IMPRESSION: Stereotactic-guided biopsy of calcifications in the upper outer
right breast. No apparent complications.

ADDENDUM:
Pathology revealed HIGH-GRADE DUCTAL CARCINOMA IN SITU WITH FOCAL
NECROSIS AND CALCIFICATIONS of the Right breast, upper outer. This
was found to be concordant by Dr. KAI MAGNE.

Pathology results were discussed with the patient by telephone. The
patient reported doing well after the biopsy with tenderness at the
site. Post biopsy instructions and care were reviewed and questions
were answered. The patient was encouraged to call The [REDACTED] for any additional concerns. My direct phone
number was provided.

The patient was referred to [REDACTED]
[REDACTED] at [REDACTED] on
[DATE].

The patient is scheduled for Left breast stereotatic guided biopsies
x 2 on [DATE]. Further recommendations will be guided by
the results of these biopsies.

Consideration for a bilateral breast MRI for further evaluation of
extent of disease given the High Grade histology and age.

Pathology results reported by KAI MAGNE, RN on [DATE].



Using sterile technique and 1% Lidocaine as local anesthetic, under
stereotactic guidance, a 9 gauge vacuum assisted device was used to
perform core needle biopsy of calcifications in the upper outer
right breast using a superior approach. Specimen radiograph was
performed showing calcifications in 3 core specimens. Specimens with
calcifications are identified for pathology.

Lesion quadrant: Upper-outer right

At the conclusion of the procedure, coil shaped tissue marker clip
was deployed into the biopsy cavity. Follow-up 2-view mammogram was
performed and dictated separately.
IMPRESSION: Stereotactic-guided biopsy of calcifications in the upper outer
right breast. No apparent complications.

## 2020-04-09 IMAGING — MG MM BREAST LOCALIZATION CLIP
4 series · 4 of 12 positions shown · non-contrast
Comparison: Previous exam(s).

CLINICAL DATA: Evaluate biopsy marker

EXAM:
DIAGNOSTIC RIGHT MAMMOGRAM POST ULTRASOUND BIOPSY

[R CC synth-2D]
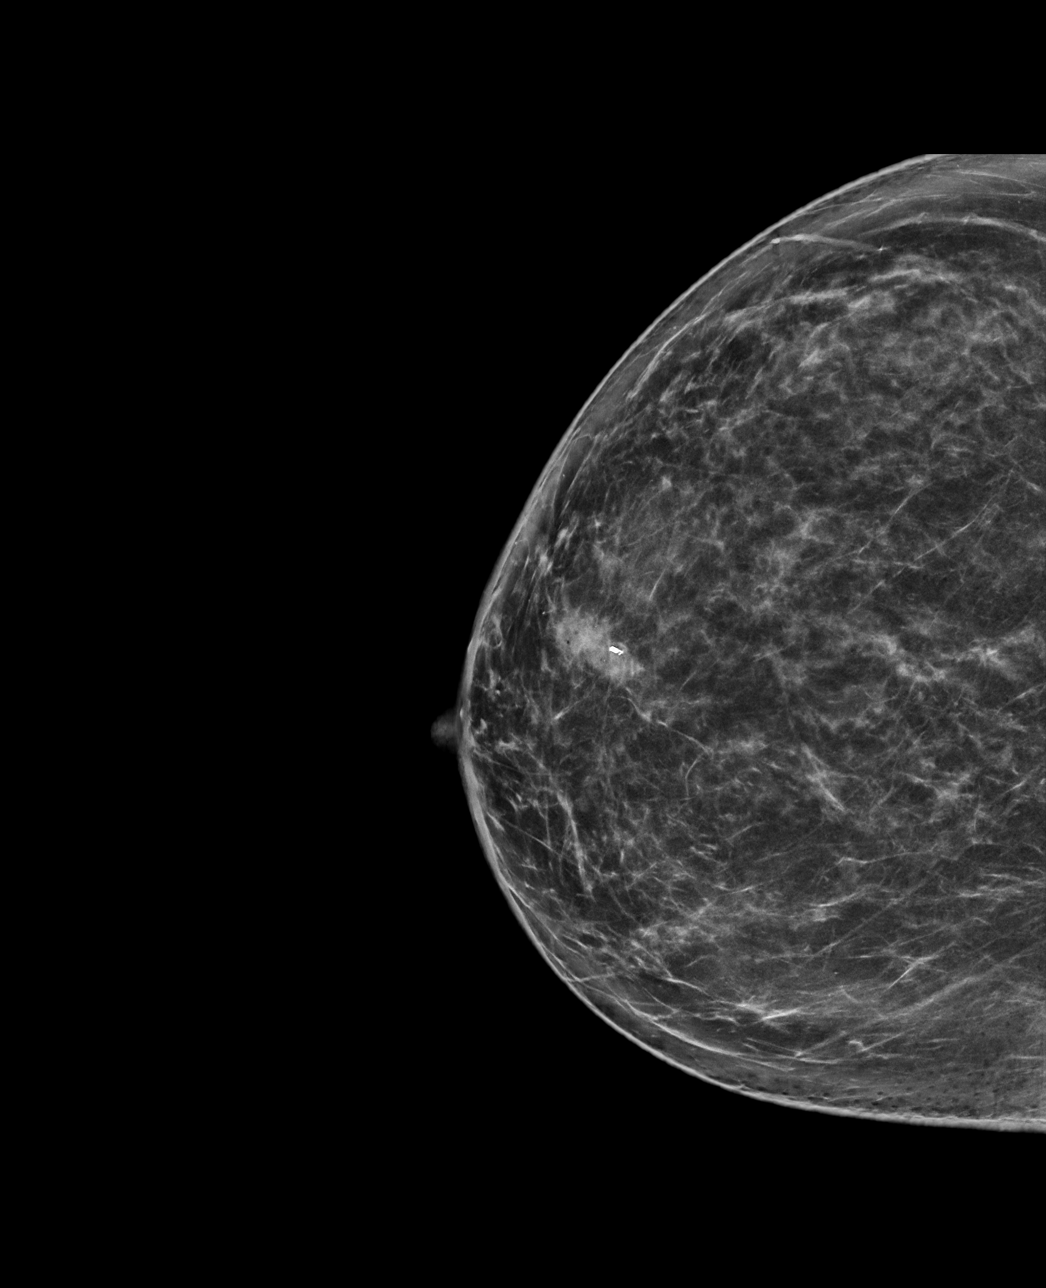

[R ML synth-2D]
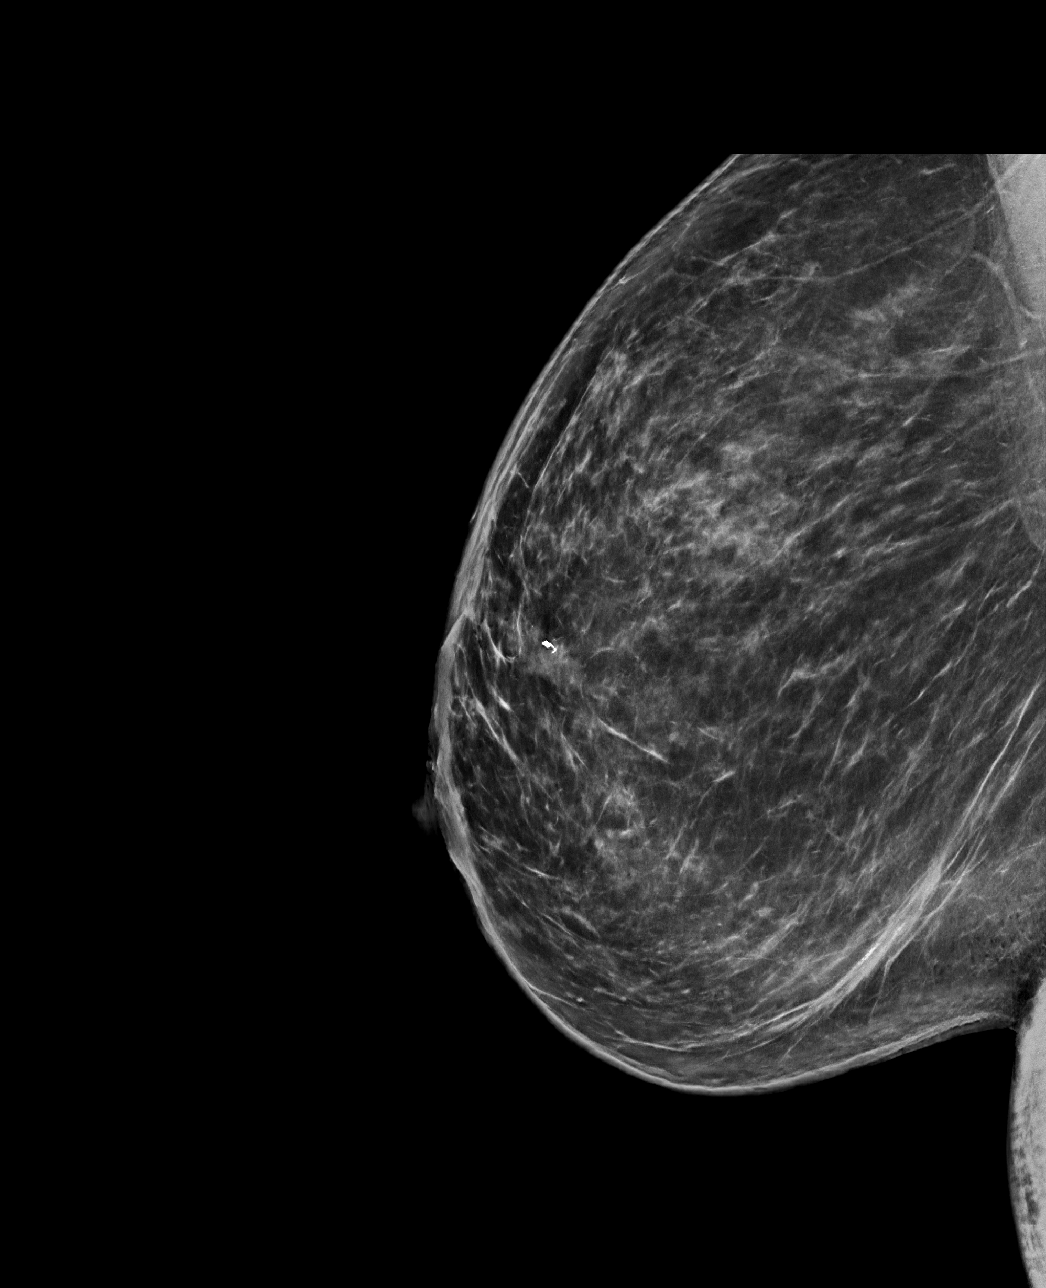

[R CC tomo · tomo slice 41/82.0]
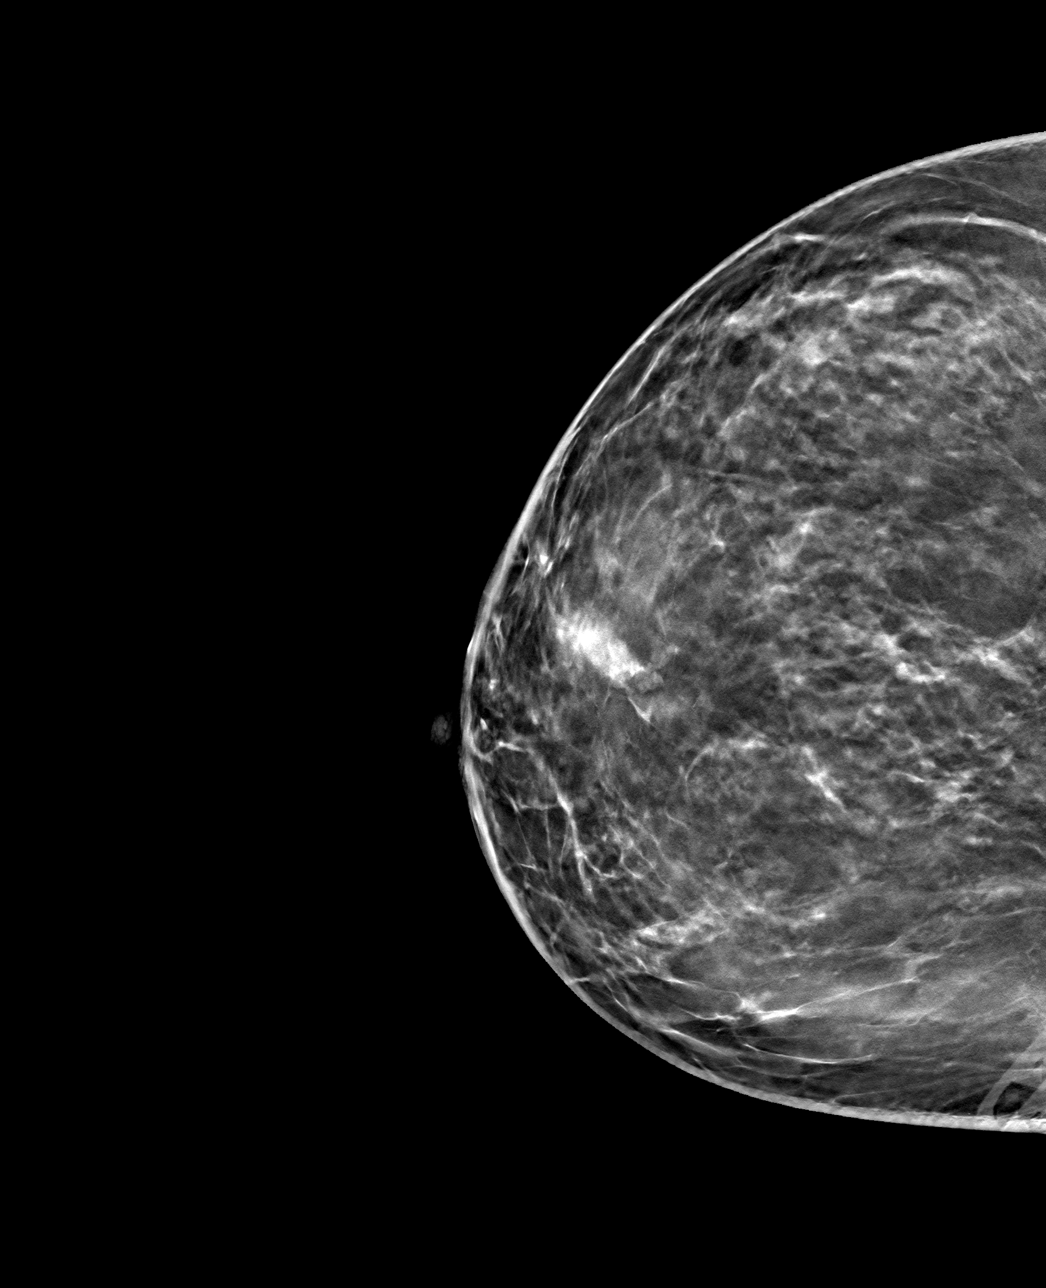

[R ML tomo · tomo slice 45/89.0]
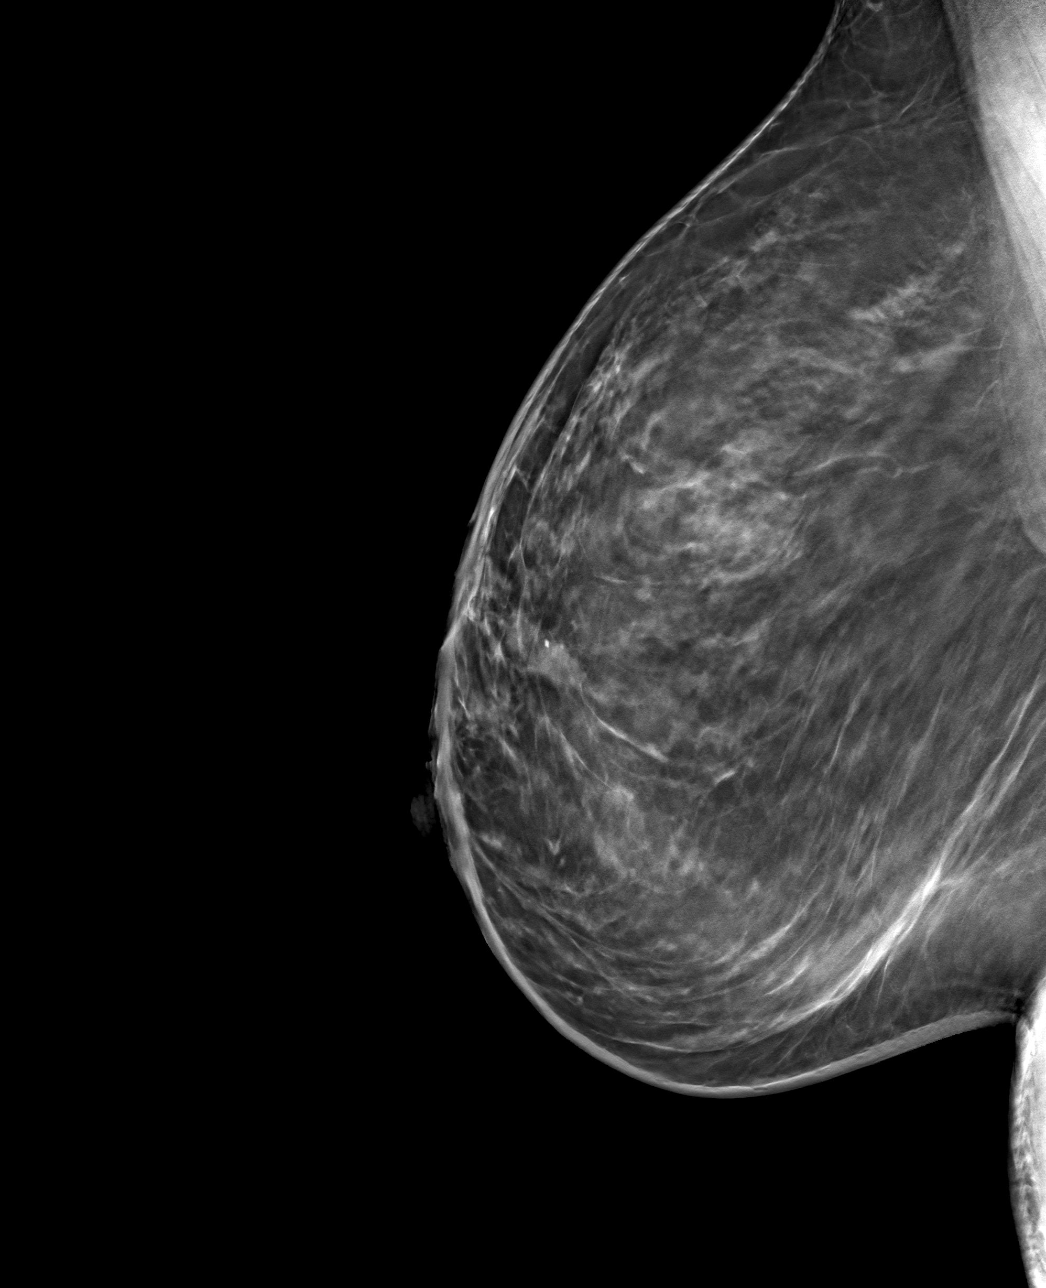

[4 of 12 positions shown; findings below may reference images not displayed]

FINDINGS: Mammographic images were obtained following stereotactic guided
biopsy of right breast calcifications. The biopsy marking clip is
5.5 mm inferior to the biopsied calcifications
IMPRESSION: The coil shaped marker is 5.5 mm inferior to the biopsied
calcifications.

Final Assessment: Post Procedure Mammograms for Marker Placement

## 2020-04-10 ENCOUNTER — Other Ambulatory Visit: Payer: Self-pay | Admitting: Family Medicine

## 2020-04-10 DIAGNOSIS — C50911 Malignant neoplasm of unspecified site of right female breast: Secondary | ICD-10-CM

## 2020-04-11 ENCOUNTER — Telehealth: Payer: Self-pay | Admitting: Hematology

## 2020-04-11 ENCOUNTER — Encounter: Payer: Self-pay | Admitting: *Deleted

## 2020-04-11 DIAGNOSIS — D0511 Intraductal carcinoma in situ of right breast: Secondary | ICD-10-CM | POA: Insufficient documentation

## 2020-04-11 NOTE — Telephone Encounter (Signed)
Spoke to patient to confirm afternoon [facility] appointment on 9/8, explained how the day will go and to expect a phone call from the surgeons off as well as fill out paper work that I emailed to [email or web address]

## 2020-04-15 NOTE — Progress Notes (Signed)
[facility]   [phone] [phone] Note   Patient Care Team: [place], Betty G, MD as PCP - General (Family Medicine) Rockwell Germany, RN as Oncology Nurse Navigator Mauro Kaufmann, RN as Oncology Nurse Navigator Truitt Merle, MD as Consulting Physician (Hematology) Coralie Keens, MD as Consulting Physician (General Surgery) Kyung Rudd, MD as Consulting Physician (Radiation Oncology)  Date of Service:  04/16/2020   CHIEF COMPLAINTS/PURPOSE OF CONSULTATION:  Newly diagnosed right breast DCIS   Oncology History Overview Note  Cancer Staging No matching staging information was found for the patient.    Ductal carcinoma in situ (DCIS) of right breast  04/01/2020 Mammogram   IMPRESSION: 1. Indeterminate 45mm right breast calcifications. 2. Probably benign left breast masses x2, one of which may correspond with a complicated cyst at the 4 o'clock retroareolar Position measures 7 x 5 x 2 mm.   04/09/2020 Initial Biopsy   Diagnosis Breast, right, needle core biopsy, upper outer - DUCTAL CARCINOMA IN SITU, HIGH-GRADE WITH FOCAL NECROSIS AND CALCIFICATIONS. SEE NOTE Diagnosis Note DCIS measures 0.2 cm in greatest linear dimension. A breast prognostic profile (ER, PR) is pending and will be reported in an addendum. Dr. Saralyn Pilar reviewed the case and concurs with the diagnosis. [facility] was notified on 04/10/2020.     PROGNOSTIC INDICATORS Results: IMMUNOHISTOCHEMICAL AND MORPHOMETRIC ANALYSIS PERFORMED MANUALLY Estrogen Receptor: 90%, POSITIVE, MODERATE STAINING INTENSITY Progesterone Receptor: 80%, POSITIVE, STRONG STAINING INTENSITY   04/11/2020 Initial Diagnosis   Ductal carcinoma in situ (DCIS) of right breast      HISTORY OF PRESENTING ILLNESS:  Sue Jackson 39 y.o. female is a here because of newly diagnosed right breast DCIS. The patient presents to the clinic today  accompanied by her wife.  Her breast mass was found by her first screening mammogram. She has not had prior breast imaging or biopsy. She did not feel the initial mass herself. She denies change in breast, nippled  She moved from [place] to [place] 4 years ago. Socially she is married and has no children. She works in the [employer]. She smoked cigarettes for less than 1 year. She notes when in [place] she was given Cannabis for her sleep. Last used 2-3 weeks ago. She has a PMHx of Anxiety/Depression, Bipolar, HTN. She is not seeing a therapist and feels she can manage her mood off medications. She notes she has arthritis mainly in her back. She has been dealing with this. I reviewed medication list with her. She uses half dose propranolol for her jitters. She has 2 maternal aunts had breast cancer in 20s and 75s.     GYN HISTORY  Menarchal: 10 LMP: 04/07/20 Contraceptive: No HRT: NA G0    REVIEW OF SYSTEMS:    Constitutional: Denies fevers, chills or abnormal night sweats Eyes: Denies blurriness of vision, double vision or watery eyes Ears, nose, mouth, throat, and face: Denies mucositis or sore throat Respiratory: Denies cough, dyspnea or wheezes Cardiovascular: Denies palpitation, chest discomfort or lower extremity swelling Gastrointestinal:  Denies nausea, heartburn or change in bowel habits Skin: Denies abnormal skin rashes Lymphatics: Denies new lymphadenopathy or easy bruising Neurological:Denies numbness, tingling or new weaknesses Behavioral/Psych: Mood is stable, no new changes  All other systems were reviewed with the patient and are negative.   MEDICAL HISTORY:  Past Medical History:  Diagnosis Date  . Anxiety   . Arthritis   . Depression   . Hypertension  SURGICAL HISTORY: History reviewed. No pertinent surgical history.  SOCIAL HISTORY: Social History   Socioeconomic History  . Marital status: Married    Spouse name: Not on file  .  Number of children: 0  . Years of education: Not on file  . Highest education level: Not on file  Occupational History  . Not on file  Tobacco Use  . Smoking status: Never Smoker  . Smokeless tobacco: Never Used  Vaping Use  . Vaping Use: Never used  Substance and Sexual Activity  . Alcohol use: Yes    Comment: occasionally  . Drug use: Yes    Types: Marijuana  . Sexual activity: Yes    Birth control/protection: None  Other Topics Concern  . Not on file  Social History Narrative  . Not on file   Social Determinants of Health   Financial Resource Strain: Low Risk   . Difficulty of Paying Living Expenses: Not very hard  Food Insecurity: No Food Insecurity  . Worried About Charity fundraiser in the Last Year: Never true  . Ran Out of Food in the Last Year: Never true  Transportation Needs: No Transportation Needs  . Lack of Transportation (Medical): No  . Lack of Transportation (Non-Medical): No  Physical Activity:   . Days of Exercise per Week: Not on file  . Minutes of Exercise per Session: Not on file  Stress:   . Feeling of Stress : Not on file  Social Connections:   . Frequency of Communication with Friends and Family: Not on file  . Frequency of Social Gatherings with Friends and Family: Not on file  . Attends Religious Services: Not on file  . Active Member of Clubs or Organizations: Not on file  . Attends Archivist Meetings: Not on file  . Marital Status: Not on file  Intimate Partner Violence:   . Fear of Current or Ex-Partner: Not on file  . Emotionally Abused: Not on file  . Physically Abused: Not on file  . Sexually Abused: Not on file    FAMILY HISTORY: Family History  Problem Relation Age of Onset  . Arthritis Mother   . Asthma Mother   . Cancer Maternal Aunt 59       breast  . Depression Maternal Aunt   . Hypertension Maternal Aunt   . Breast cancer Maternal Aunt   . Diabetes Maternal Grandfather   . Heart disease Maternal  Grandfather   . Hypertension Maternal Grandfather   . Stroke Maternal Grandfather   . Cancer Maternal Aunt 39       breast cancer     ALLERGIES:  has No Known Allergies.  MEDICATIONS:  Current Outpatient Medications  Medication Sig Dispense Refill  . amLODipine-benazepril (LOTREL) 5-10 MG capsule TAKE 1 CAPSULE BY MOUTH EVERY DAY 90 capsule 2  . fluticasone (FLONASE) 50 MCG/ACT nasal spray Place 1 spray into both nostrils 2 (two) times daily. 16 g 2  . LORazepam (ATIVAN) 0.5 MG tablet Take 1 tablet (0.5 mg total) by mouth 2 (two) times daily as needed for anxiety. 60 tablet 1  . meclizine (ANTIVERT) 25 MG tablet Take 1 tablet (25 mg total) by mouth 2 (two) times daily as needed for dizziness. 30 tablet 0  . propranolol (INDERAL) 40 MG tablet Take 0.5 tablets (20 mg total) by mouth daily. 90 tablet 2   No current facility-administered medications for this visit.    PHYSICAL EXAMINATION: ECOG PERFORMANCE STATUS: 0 - Asymptomatic  Vitals:  04/16/20 1258  BP: 136/85  Pulse: 81  Resp: 19  Temp: (!) 97.1 F (36.2 C)  SpO2: 100%   Filed Weights   04/16/20 1258  Weight: 222 lb 14.4 oz (101.1 kg)    GENERAL:alert, no distress and comfortable SKIN: skin color, texture, turgor are normal, no rashes or significant lesions EYES: normal, Conjunctiva are pink and non-injected, sclera clear  NECK: supple, thyroid normal size, non-tender, without nodularity LYMPH:  no palpable lymphadenopathy in the cervical, axillary  LUNGS: clear to auscultation and percussion with normal breathing effort HEART: regular rate & rhythm and no murmurs and no lower extremity edema ABDOMEN:abdomen soft, non-tender and normal bowel sounds Musculoskeletal:no cyanosis of digits and no clubbing  NEURO: alert & oriented x 3 with fluent speech, no focal motor/sensory deficits BREAST: (+) Right breast skin ecchymosis with 1cm hematoma. No palpable nodules or adenopathy bilaterally. Breast exam  benign.  LABORATORY DATA:  I have reviewed the data as listed CBC Latest Ref Rng & Units 04/16/2020 09/22/2019 09/04/2018  WBC 4.0 - 10.5 K/uL 9.3 12.2(H) 13.0(H)  Hemoglobin 12.0 - 15.0 g/dL 14.0 14.1 15.1(H)  Hematocrit 36 - 46 % 43.5 43.6 45.4  Platelets 150 - 400 K/uL 309 338 353    CMP Latest Ref Rng & Units 04/16/2020 01/30/2020 09/22/2019  Glucose 70 - 99 mg/dL 110(H) 115(H) 107(H)  BUN 6 - 20 mg/dL 17 15 20   Creatinine 0.44 - 1.00 mg/dL 1.11(H) 0.85 0.92  Sodium 135 - 145 mmol/L 138 134(L) 135  Potassium 3.5 - 5.1 mmol/L 4.2 4.9 4.0  Chloride 98 - 111 mmol/L 105 104 100  CO2 22 - 32 mmol/L 25 27 23   Calcium 8.9 - 10.3 mg/dL 8.9 9.0 9.1  Total Protein 6.5 - 8.1 g/dL 7.3 6.7 7.5  Total Bilirubin 0.3 - 1.2 mg/dL 0.3 0.3 0.7  Alkaline Phos 38 - 126 U/L 75 72 66  AST 15 - 41 U/L 14(L) 15 21  ALT 0 - 44 U/L 27 25 38     RADIOGRAPHIC STUDIES: I have personally reviewed the radiological images as listed and agreed with the findings in the report. US BREAST LTD UNI LEFT INC AXILLA  Result Date: 04/01/2020 CLINICAL DATA:  39 year old female recalled from baseline screening mammogram for right breast calcifications and possible left breast asymmetries. EXAM: DIGITAL DIAGNOSTIC BILATERAL MAMMOGRAM WITH CAD AND TOMO ULTRASOUND LEFT BREAST COMPARISON:  Previous exam(s). ACR Breast Density Category b: There are scattered areas of fibroglandular density. FINDINGS: A 9 mm group of pleomorphic calcifications is demonstrated in the superior central right breast at middle depth. There are persistent oval, circumscribed equal density masses in the upper outer and lower central left breast. The mass in the upper outer quadrant demonstrates a possible hilar notch, suggesting an intramammary lymph node. Further evaluation with ultrasound was performed. Mammographic images were processed with CAD. Targeted ultrasound is performed, showing normal fibroglandular tissue without focal or suspicious sonographic  abnormality in the entire upper outer quadrant of the left breast. An oval, circumscribed hypoechoic mass identified at the 4 o'clock retroareolar position. It measures 7 x 5 x 2 mm without internal vascularity. This may correspond with the mammographic finding in the lower central left breast. IMPRESSION: 1. Indeterminate right breast calcifications. 2. Probably benign left breast masses x2, one of which may correspond with a complicated cyst at the 4 o'clock retroareolar position. RECOMMENDATION: 1. Stereotactic biopsy of the right breast. 2. If the above biopsy results demonstrate benign pathology, recommendation is for six-month mammographic  and sonographic follow-up of the left breast. 3. If the above biopsy results demonstrate malignancy or atypia, additional biopsies are recommended on the left. I have discussed the findings and recommendations with the patient. If applicable, a reminder letter will be sent to the patient regarding the next appointment. BI-RADS CATEGORY  4: Suspicious. Electronically Signed   By: Kristopher Oppenheim M.D.   On: 04/01/2020 15:45   MM DIAG BREAST TOMO BILATERAL  Result Date: 04/01/2020 CLINICAL DATA:  39 year old female recalled from baseline screening mammogram for right breast calcifications and possible left breast asymmetries. EXAM: DIGITAL DIAGNOSTIC BILATERAL MAMMOGRAM WITH CAD AND TOMO ULTRASOUND LEFT BREAST COMPARISON:  Previous exam(s). ACR Breast Density Category b: There are scattered areas of fibroglandular density. FINDINGS: A 9 mm group of pleomorphic calcifications is demonstrated in the superior central right breast at middle depth. There are persistent oval, circumscribed equal density masses in the upper outer and lower central left breast. The mass in the upper outer quadrant demonstrates a possible hilar notch, suggesting an intramammary lymph node. Further evaluation with ultrasound was performed. Mammographic images were processed with CAD. Targeted  ultrasound is performed, showing normal fibroglandular tissue without focal or suspicious sonographic abnormality in the entire upper outer quadrant of the left breast. An oval, circumscribed hypoechoic mass identified at the 4 o'clock retroareolar position. It measures 7 x 5 x 2 mm without internal vascularity. This may correspond with the mammographic finding in the lower central left breast. IMPRESSION: 1. Indeterminate right breast calcifications. 2. Probably benign left breast masses x2, one of which may correspond with a complicated cyst at the 4 o'clock retroareolar position. RECOMMENDATION: 1. Stereotactic biopsy of the right breast. 2. If the above biopsy results demonstrate benign pathology, recommendation is for six-month mammographic and sonographic follow-up of the left breast. 3. If the above biopsy results demonstrate malignancy or atypia, additional biopsies are recommended on the left. I have discussed the findings and recommendations with the patient. If applicable, a reminder letter will be sent to the patient regarding the next appointment. BI-RADS CATEGORY  4: Suspicious. Electronically Signed   By: Kristopher Oppenheim M.D.   On: 04/01/2020 15:45   MM CLIP PLACEMENT RIGHT  Result Date: 04/09/2020 CLINICAL DATA:  Evaluate biopsy marker EXAM: DIAGNOSTIC RIGHT MAMMOGRAM POST ULTRASOUND BIOPSY COMPARISON:  Previous exam(s). FINDINGS: Mammographic images were obtained following stereotactic guided biopsy of right breast calcifications. The biopsy marking clip is 5.5 mm inferior to the biopsied calcifications IMPRESSION: The coil shaped marker is 5.5 mm inferior to the biopsied calcifications. Final Assessment: Post Procedure Mammograms for Marker Placement Electronically Signed   By: Dorise Bullion III M.D   On: 04/09/2020 14:27   MM RT BREAST BX W LOC DEV 1ST LESION IMAGE BX SPEC STEREO GUIDE  Result Date: 04/09/2020 CLINICAL DATA:  Biopsy of right breast calcifications EXAM: RIGHT BREAST  STEREOTACTIC CORE NEEDLE BIOPSY COMPARISON:  Previous exams. FINDINGS: The patient and I discussed the procedure of stereotactic-guided biopsy including benefits and alternatives. We discussed the high likelihood of a successful procedure. We discussed the risks of the procedure including infection, bleeding, tissue injury, clip migration, and inadequate sampling. Informed written consent was given. The usual time out protocol was performed immediately prior to the procedure. Using sterile technique and 1% Lidocaine as local anesthetic, under stereotactic guidance, a 9 gauge vacuum assisted device was used to perform core needle biopsy of calcifications in the upper outer right breast using a superior approach. Specimen radiograph was performed showing calcifications in  3 core specimens. Specimens with calcifications are identified for pathology. Lesion quadrant: Upper-outer right At the conclusion of the procedure, coil shaped tissue marker clip was deployed into the biopsy cavity. Follow-up 2-view mammogram was performed and dictated separately. IMPRESSION: Stereotactic-guided biopsy of calcifications in the upper outer right breast. No apparent complications. Electronically Signed   By: Dorise Bullion III M.D   On: 04/09/2020 14:18    ASSESSMENT & PLAN:  Sayla Golonka is a 39 y.o.  female with a history of Anxiety/Depression, HTN, Arthritis    1. Right breast DCIS, High grade, ER+/PR+  -I discussed her breast imaging and needle biopsy results with patient and her wife in great detail. She was found to have high grade DCIS in her right breast with necrosis and calcifications.  -She is a candidate for breast conservation surgery. She has been seen by breast surgeon Dr. Ninfa Linden, who recommends lumpectomy. Given her young age, will also obtain breast MRI before proceeding with Surgery.  -Her DCIS will be cured by complete surgical resection. Any form of adjuvant therapy is preventive. -I  reviewed her risk and treatment benefits using the Breast Cancer Nomogram from [facility]). Based on family, PMx and lifestyle she has a 24% risk of developing future breast cancer in the next 10 years. Her risk would drop to 10-12% with RT or Antiestrogen therapy alone. With both adjuvant treatments her risk would decrease to 5%. She is interested in both preventative treatments.  -She will likely benefit from breast radiation if she undergo lumpectomy to decrease the risk of local breast cancer. She will discuss this further with Dr Lisbeth Renshaw today.  -Given her strongly positive ER and PR, I do recommend antiestrogen therapy with Tamoxifen for 5 years, which decrease her risk of future breast cancer by ~40%.   -The potential side effects, which includes but not limited to, hot flash, skin and vaginal dryness, slightly increased risk of cardiovascular disease and cataract, small risk of thrombosis and endometrial cancer, were discussed with her in great details. Preventive strategies for thrombosis, such as being physically active, using compression stocks, avoid cigarette smoking, etc., were reviewed with her. I also recommend her to follow-up with her gynecologist once a year, and watch for vaginal spotting or bleeding, as a clinically sign of endometrial cancer, etc. She voiced good understanding, and agrees to proceed. Will start after she completes adjuvant breast radiation.  -We also discussed that biopsy may have sampling limitation, we will review her surgical path, to see if she has any invasive carcinoma components. -We also discussed the breast cancer surveillance after her surgery. She will continue annual screening mammogram, self exams, and a routine office visit with lab and exam with [place]. I discussed the option of additional screening with annual breast MRIs. I also discussed Abbreviated MRIs which have $400 out-of-pocket cost if insurance does not cover standard breast  MRI. She is interested. -Labs reviewed, CBC and CMP WNL except BG 110, Cr 1.11. I encouraged her to drink more water. Initial exam benign.  -F/u after surgery and radiation    2. Genetic Testing  -Given she has 2 maternal aunts with breast cancer and her DCIS she is eligible for genetic testing. She is interested, I will send referral.    3. Anxiety/Depression -She notes to having chronic Anxiety and Depression along with bipolar. She did not tolerate Zoloft or Effexor in the past.  -She is on Ativan and half tablet Propranolol. Medication is managed by  her PCP. She is not seeing a therapist or Psychiatrist.    4. HTN, Arthritis in back -Managed by PCP. Continue medications.    PLAN:  -Send genetic referral  -MRI breast in 1-2 weeks  -Proceed with surgery soon  -F/u after surgery and Radiation    No orders of the defined types were placed in this encounter.   All questions were answered. The patient knows to call the clinic with any problems, questions or concerns. The total time spent in the appointment was 45 minutes.     Truitt Merle, MD 04/16/2020 5:25 PM  I, Joslyn Devon, am acting as scribe for Truitt Merle, MD.   I have reviewed the above documentation for accuracy and completeness, and I agree with the above.

## 2020-04-16 ENCOUNTER — Other Ambulatory Visit: Payer: Self-pay

## 2020-04-16 ENCOUNTER — Inpatient Hospital Stay: Payer: 59 | Admitting: Internal Medicine

## 2020-04-16 ENCOUNTER — Inpatient Hospital Stay: Payer: 59 | Attending: Hematology | Admitting: Hematology

## 2020-04-16 ENCOUNTER — Inpatient Hospital Stay: Payer: 59 | Admitting: Genetic Counselor

## 2020-04-16 ENCOUNTER — Ambulatory Visit: Payer: 59 | Admitting: Physical Therapy

## 2020-04-16 ENCOUNTER — Encounter: Payer: Self-pay | Admitting: *Deleted

## 2020-04-16 ENCOUNTER — Ambulatory Visit
Admission: RE | Admit: 2020-04-16 | Discharge: 2020-04-16 | Disposition: A | Discharge status: Home / Self Care | Payer: 59 | Source: Ambulatory Visit | Attending: Radiation Oncology | Admitting: Radiation Oncology

## 2020-04-16 ENCOUNTER — Encounter: Payer: Self-pay | Admitting: Hematology

## 2020-04-16 ENCOUNTER — Encounter: Payer: Self-pay | Admitting: General Practice

## 2020-04-16 ENCOUNTER — Other Ambulatory Visit: Payer: Self-pay | Admitting: *Deleted

## 2020-04-16 VITALS — BP 136/85 | HR 81 | Temp 97.1°F | Resp 19 | Ht 66.0 in | Wt 222.9 lb

## 2020-04-16 DIAGNOSIS — Z803 Family history of malignant neoplasm of breast: Secondary | ICD-10-CM | POA: Diagnosis not present

## 2020-04-16 DIAGNOSIS — F329 Major depressive disorder, single episode, unspecified: Secondary | ICD-10-CM | POA: Insufficient documentation

## 2020-04-16 DIAGNOSIS — I10 Essential (primary) hypertension: Secondary | ICD-10-CM | POA: Insufficient documentation

## 2020-04-16 DIAGNOSIS — F319 Bipolar disorder, unspecified: Secondary | ICD-10-CM | POA: Insufficient documentation

## 2020-04-16 DIAGNOSIS — D0511 Intraductal carcinoma in situ of right breast: Secondary | ICD-10-CM

## 2020-04-16 DIAGNOSIS — Z87891 Personal history of nicotine dependence: Secondary | ICD-10-CM | POA: Insufficient documentation

## 2020-04-16 DIAGNOSIS — Z170 Estrogen receptor positive status [ER+]: Secondary | ICD-10-CM

## 2020-04-16 DIAGNOSIS — M1990 Unspecified osteoarthritis, unspecified site: Secondary | ICD-10-CM | POA: Diagnosis not present

## 2020-04-16 DIAGNOSIS — F419 Anxiety disorder, unspecified: Secondary | ICD-10-CM | POA: Diagnosis not present

## 2020-04-16 LAB — CMP (CANCER CENTER ONLY)
ALT: 27 U/L (ref 0–44)
AST: 14 U/L — ABNORMAL LOW (ref 15–41)
Albumin: 3.5 g/dL (ref 3.5–5.0)
Alkaline Phosphatase: 75 U/L (ref 38–126)
Anion gap: 8 (ref 5–15)
BUN: 17 mg/dL (ref 6–20)
CO2: 25 mmol/L (ref 22–32)
Calcium: 8.9 mg/dL (ref 8.9–10.3)
Chloride: 105 mmol/L (ref 98–111)
Creatinine: 1.11 mg/dL — ABNORMAL HIGH (ref 0.44–1.00)
GFR, Est AFR Am: >60 mL/min (ref >60)
GFR, Estimated: >60 mL/min (ref >60)
Glucose, Bld: 110 mg/dL — ABNORMAL HIGH (ref 70–99)
Potassium: 4.2 mmol/L (ref 3.5–5.1)
Sodium: 138 mmol/L (ref 135–145)
Total Bilirubin: 0.3 mg/dL (ref 0.3–1.2)
Total Protein: 7.3 g/dL (ref 6.5–8.1)

## 2020-04-16 LAB — CBC WITH DIFFERENTIAL (CANCER CENTER ONLY)
Abs Immature Granulocytes: 0.07 10*3/uL (ref 0.00–0.07)
Basophils Absolute: 0 10*3/uL (ref 0.0–0.1)
Basophils Relative: 0 %
Eosinophils Absolute: 0.1 10*3/uL (ref 0.0–0.5)
Eosinophils Relative: 1 %
HCT: 43.5 % (ref 36.0–46.0)
Hemoglobin: 14 g/dL (ref 12.0–15.0)
Immature Granulocytes: 1 %
Lymphocytes Relative: 17 %
Lymphs Abs: 1.6 10*3/uL (ref 0.7–4.0)
MCH: 26.5 pg (ref 26.0–34.0)
MCHC: 32.2 g/dL (ref 30.0–36.0)
MCV: 82.4 fL (ref 80.0–100.0)
Monocytes Absolute: 0.9 10*3/uL (ref 0.1–1.0)
Monocytes Relative: 10 %
Neutro Abs: 6.5 10*3/uL (ref 1.7–7.7)
Neutrophils Relative %: 71 %
Platelet Count: 309 10*3/uL (ref 150–400)
RBC: 5.28 MIL/uL — ABNORMAL HIGH (ref 3.87–5.11)
RDW: 14.6 % (ref 11.5–15.5)
WBC Count: 9.3 10*3/uL (ref 4.0–10.5)
nRBC: 0 % (ref 0.0–0.2)

## 2020-04-16 LAB — GENETIC SCREENING ORDER

## 2020-04-16 NOTE — Progress Notes (Signed)
Breast Multidisciplinary Clinic Spiritual Care  Met with Sue Jackson and her spouse Sue Jackson in [facility] to introduce [facility] team/resources and assess for distress and other psychosocial needs.   Sue Jackson was already planning to find a counselor to help with anxiety and needs unrelated to new diagnosis. Affirmed seeking this additional layer of support as a strength and potential resource for processing cancer distress as well. Provided empathic listening, normalization of feelings, affirmation of strengths, and emotional support. Encouraged couple to consider [facility] for meaning-making and connection.  Follow up needed: No. Kati prefers to reach out as needed/desired.   [place], [place], [facility] Pager [phone] Voicemail [phone]

## 2020-04-16 NOTE — Progress Notes (Signed)
Radiation Oncology         [phone] ________________________________  Name: Sue Jackson        MRN: [id number]  Date of Service: 04/16/2020 DOB: 1980/10/28  UM:PNTIRW, Malka So, MD  Coralie Keens, MD     REFERRING PHYSICIAN: Coralie Keens, MD   DIAGNOSIS: The encounter diagnosis was Ductal carcinoma in situ (DCIS) of right breast.   HISTORY OF PRESENT ILLNESS: Sue Jackson is a 39 y.o. female seen in the multidisciplinary breast clinic for a new diagnosis of right breast cancer. The patient was noted to have screening calcifications in the right breast and left breast assymmetry on her first mammogram. She underwent further imaging revealing the right breast calcifications measuring 9 mm. No axillary ultrasound was performed. A biopsy of the right breast on 04/09/20 revealed a high grade DCIS that was ER/PR positive. She is scheduled for a left steretactic biopsy on 04/24/20. She is seen today to discuss options of treatment for her cancer.   PREVIOUS RADIATION THERAPY: No   PAST MEDICAL HISTORY:  Past Medical History:  Diagnosis Date  . Anxiety   . Arthritis   . Depression   . Hypertension        PAST SURGICAL HISTORY:No past surgical history on file.   FAMILY HISTORY:  Family History  Problem Relation Age of Onset  . Arthritis Mother   . Asthma Mother   . Cancer Maternal Aunt        breast  . Depression Maternal Aunt   . Hypertension Maternal Aunt   . Breast cancer Maternal Aunt   . Diabetes Maternal Grandfather   . Heart disease Maternal Grandfather   . Hypertension Maternal Grandfather   . Stroke Maternal Grandfather      SOCIAL HISTORY:  reports that she has quit smoking. She has never used smokeless tobacco. She reports current alcohol use. She reports that she does not use drugs. The patient is married and lives in [place]. She's accompanied by her wife [name]. 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     ALLERGIES: Patient has no known allergies.   MEDICATIONS:  Current Outpatient Medications  Medication Sig Dispense Refill  . amLODipine-benazepril (LOTREL) 5-10 MG capsule TAKE 1 CAPSULE BY MOUTH EVERY DAY 90 capsule 2  . fluticasone (FLONASE) 50 MCG/ACT nasal spray Place 1 spray into both nostrils 2 (two) times daily. 16 g 2  . hydrOXYzine (ATARAX/VISTARIL) 25 MG tablet Take 1 tablet (25 mg total) by mouth 2 (two) times daily as needed. 60 tablet 1  . LORazepam (ATIVAN) 0.5 MG tablet Take 1 tablet (0.5 mg total) by mouth 2 (two) times daily as needed for anxiety. 60 tablet 1  . meclizine (ANTIVERT) 25 MG tablet Take 1 tablet (25 mg total) by mouth 2 (two) times daily as needed for dizziness. 30 tablet 0  . propranolol (INDERAL) 40 MG tablet Take 0.5 tablets (20 mg total) by mouth daily. 90 tablet 2   No current facility-administered medications for this encounter.     REVIEW OF SYSTEMS: On review of systems, the patient reports that she is doing well overall. She denies any chest pain, shortness of breath, cough, fevers, chills, night sweats, unintended weight changes. She denies any bowel or bladder disturbances, and denies abdominal pain, nausea or vomiting. She denies any new musculoskeletal or joint aches or pains. A complete review of systems is obtained and is otherwise negative.     PHYSICAL EXAM:  Wt Readings from  Last 3 Encounters:  01/30/20 223 lb (101.2 kg)  10/30/19 227 lb (103 kg)  09/22/19 220 lb (99.8 kg)   Temp Readings from Last 3 Encounters:  01/30/20 97.7 F (36.5 C) (Temporal)  10/30/19 (!) 96.2 F (35.7 C) (Oral)  09/22/19 98 F (36.7 C) (Oral)   BP Readings from Last 3 Encounters:  01/30/20 124/82  10/30/19 132/70  09/22/19 120/68   Pulse Readings from Last 3 Encounters:  01/30/20 60  10/30/19 68  09/22/19 73    In general this is a well appearing hispanic female in no acute distress. She's alert and oriented x4 and appropriate throughout the  examination. Cardiopulmonary assessment is negative for acute distress and she exhibits normal effort. Bilateral breast exam is deferred.    ECOG = 0  0 - Asymptomatic (Fully active, able to carry on all predisease activities without restriction)  1 - Symptomatic but completely ambulatory (Restricted in physically strenuous activity but ambulatory and able to carry out work of a light or sedentary nature. For example, light housework, office work)  2 - Symptomatic, <50% in bed during the day (Ambulatory and capable of all self care but unable to carry out any work activities. Up and about more than 50% of waking hours)  3 - Symptomatic, >50% in bed, but not bedbound (Capable of only limited self-care, confined to bed or chair 50% or more of waking hours)  4 - Bedbound (Completely disabled. Cannot carry on any self-care. Totally confined to bed or chair)  5 - Death   Eustace Pen MM, Creech RH, Tormey DC, et al. [phone]). "Toxicity and response criteria of the [facility] [employer] [facility] [employer]". [place] Oncol. 5 (6): 649-55    LABORATORY DATA:  Lab Results  Component Value Date   WBC 12.2 (H) 09/22/2019   HGB 14.1 09/22/2019   HCT 43.6 09/22/2019   MCV 83.2 09/22/2019   PLT 338 09/22/2019   Lab Results  Component Value Date   NA 134 (L) 01/30/2020   K 4.9 01/30/2020   CL 104 01/30/2020   CO2 27 01/30/2020   Lab Results  Component Value Date   ALT 25 01/30/2020   AST 15 01/30/2020   ALKPHOS 72 01/30/2020   BILITOT 0.3 01/30/2020      RADIOGRAPHY: US BREAST LTD UNI LEFT INC AXILLA  Result Date: 04/01/2020 CLINICAL DATA:  39 year old female recalled from baseline screening mammogram for right breast calcifications and possible left breast asymmetries. EXAM: DIGITAL DIAGNOSTIC BILATERAL MAMMOGRAM WITH CAD AND TOMO ULTRASOUND LEFT BREAST COMPARISON:  Previous exam(s). ACR Breast Density Category b: There are scattered areas of fibroglandular density. FINDINGS: A 9 mm group of  pleomorphic calcifications is demonstrated in the superior central right breast at middle depth. There are persistent oval, circumscribed equal density masses in the upper outer and lower central left breast. The mass in the upper outer quadrant demonstrates a possible hilar notch, suggesting an intramammary lymph node. Further evaluation with ultrasound was performed. Mammographic images were processed with CAD. Targeted ultrasound is performed, showing normal fibroglandular tissue without focal or suspicious sonographic abnormality in the entire upper outer quadrant of the left breast. An oval, circumscribed hypoechoic mass identified at the 4 o'clock retroareolar position. It measures 7 x 5 x 2 mm without internal vascularity. This may correspond with the mammographic finding in the lower central left breast. IMPRESSION: 1. Indeterminate right breast calcifications. 2. Probably benign left breast masses x2, one of which may correspond with a complicated cyst at the  4 o'clock retroareolar position. RECOMMENDATION: 1. Stereotactic biopsy of the right breast. 2. If the above biopsy results demonstrate benign pathology, recommendation is for six-month mammographic and sonographic follow-up of the left breast. 3. If the above biopsy results demonstrate malignancy or atypia, additional biopsies are recommended on the left. I have discussed the findings and recommendations with the patient. If applicable, a reminder letter will be sent to the patient regarding the next appointment. BI-RADS CATEGORY  4: Suspicious. Electronically Signed   By: Kristopher Oppenheim M.D.   On: 04/01/2020 15:45   MM DIAG BREAST TOMO BILATERAL  Result Date: 04/01/2020 CLINICAL DATA:  39 year old female recalled from baseline screening mammogram for right breast calcifications and possible left breast asymmetries. EXAM: DIGITAL DIAGNOSTIC BILATERAL MAMMOGRAM WITH CAD AND TOMO ULTRASOUND LEFT BREAST COMPARISON:  Previous exam(s). ACR Breast  Density Category b: There are scattered areas of fibroglandular density. FINDINGS: A 9 mm group of pleomorphic calcifications is demonstrated in the superior central right breast at middle depth. There are persistent oval, circumscribed equal density masses in the upper outer and lower central left breast. The mass in the upper outer quadrant demonstrates a possible hilar notch, suggesting an intramammary lymph node. Further evaluation with ultrasound was performed. Mammographic images were processed with CAD. Targeted ultrasound is performed, showing normal fibroglandular tissue without focal or suspicious sonographic abnormality in the entire upper outer quadrant of the left breast. An oval, circumscribed hypoechoic mass identified at the 4 o'clock retroareolar position. It measures 7 x 5 x 2 mm without internal vascularity. This may correspond with the mammographic finding in the lower central left breast. IMPRESSION: 1. Indeterminate right breast calcifications. 2. Probably benign left breast masses x2, one of which may correspond with a complicated cyst at the 4 o'clock retroareolar position. RECOMMENDATION: 1. Stereotactic biopsy of the right breast. 2. If the above biopsy results demonstrate benign pathology, recommendation is for six-month mammographic and sonographic follow-up of the left breast. 3. If the above biopsy results demonstrate malignancy or atypia, additional biopsies are recommended on the left. I have discussed the findings and recommendations with the patient. If applicable, a reminder letter will be sent to the patient regarding the next appointment. BI-RADS CATEGORY  4: Suspicious. Electronically Signed   By: Kristopher Oppenheim M.D.   On: 04/01/2020 15:45   MM CLIP PLACEMENT RIGHT  Result Date: 04/09/2020 CLINICAL DATA:  Evaluate biopsy marker EXAM: DIAGNOSTIC RIGHT MAMMOGRAM POST ULTRASOUND BIOPSY COMPARISON:  Previous exam(s). FINDINGS: Mammographic images were obtained following  stereotactic guided biopsy of right breast calcifications. The biopsy marking clip is 5.5 mm inferior to the biopsied calcifications IMPRESSION: The coil shaped marker is 5.5 mm inferior to the biopsied calcifications. Final Assessment: Post Procedure Mammograms for Marker Placement Electronically Signed   By: Dorise Bullion III M.D   On: 04/09/2020 14:27   MM RT BREAST BX W LOC DEV 1ST LESION IMAGE BX SPEC STEREO GUIDE  Result Date: 04/09/2020 CLINICAL DATA:  Biopsy of right breast calcifications EXAM: RIGHT BREAST STEREOTACTIC CORE NEEDLE BIOPSY COMPARISON:  Previous exams. FINDINGS: The patient and I discussed the procedure of stereotactic-guided biopsy including benefits and alternatives. We discussed the high likelihood of a successful procedure. We discussed the risks of the procedure including infection, bleeding, tissue injury, clip migration, and inadequate sampling. Informed written consent was given. The usual time out protocol was performed immediately prior to the procedure. Using sterile technique and 1% Lidocaine as local anesthetic, under stereotactic guidance, a 9 gauge vacuum assisted device  was used to perform core needle biopsy of calcifications in the upper outer right breast using a superior approach. Specimen radiograph was performed showing calcifications in 3 core specimens. Specimens with calcifications are identified for pathology. Lesion quadrant: Upper-outer right At the conclusion of the procedure, coil shaped tissue marker clip was deployed into the biopsy cavity. Follow-up 2-view mammogram was performed and dictated separately. IMPRESSION: Stereotactic-guided biopsy of calcifications in the upper outer right breast. No apparent complications. Electronically Signed   By: Dorise Bullion III M.D   On: 04/09/2020 14:18       IMPRESSION/PLAN: 1. High grade, ER/PR positive DCIS of the right breast.Dr. Lisbeth Renshaw discusses the pathology findings and reviews the nature of noninvasive  right breast disease. The consensus from the breast conference includes left breast biopsy and MRI of the breasts. She appears to be a good candidate for breast conservation with lumpectomy. She would benefit from adjuvant external radiotherapy to the breast followed by antiestrogen therapy. We discussed the risks, benefits, short, and long term effects of radiotherapy, and the patient is interested in proceeding. Dr. Lisbeth Renshaw discusses the delivery and logistics of radiotherapy and anticipates a course of 6 1/2 weeks of radiotherapy to the right breast. We will see her back a few weeks after surgery to discuss the simulation process and anticipate we starting radiotherapy about 4-6 weeks after surgery.  2. Contraceptive Counseling. The patient is not currently at risk of pregnancy in her relationship. She and her wife have considered having children in the future but favor adoption at this time. She is aware that she should not become pregnant during treatment, but could review any needs for further work up prior to radiotherapy. 3. Possible genetic predisposition to malignancy. The patient is a candidate for genetic testing given her personal and family history. She was offered referral and may be interested in meeting but will hold off today.   In a visit lasting 60 minutes, greater than 50% of the time was spent face to face reviewing her case, as well as in preparation of, discussing, and coordinating the patient's care.  The above documentation reflects my direct findings during this shared patient visit. Please see the separate note by Dr. Lisbeth Renshaw on this date for the remainder of the patient's plan of care.    Carola Rhine, PAC

## 2020-04-18 ENCOUNTER — Telehealth: Payer: Self-pay | Admitting: Hematology

## 2020-04-18 NOTE — Telephone Encounter (Signed)
No 9/8 los

## 2020-04-22 ENCOUNTER — Other Ambulatory Visit: Payer: Self-pay

## 2020-04-22 ENCOUNTER — Ambulatory Visit (HOSPITAL_COMMUNITY)
Admission: RE | Admit: 2020-04-22 | Discharge: 2020-04-22 | Disposition: A | Discharge status: Home / Self Care | Payer: 59 | Source: Ambulatory Visit | Attending: Surgery | Admitting: Surgery

## 2020-04-22 DIAGNOSIS — D0511 Intraductal carcinoma in situ of right breast: Secondary | ICD-10-CM | POA: Diagnosis not present

## 2020-04-22 IMAGING — MR MR BREAST BILAT WO/W CM
4 of 9 series · 23 of 48 positions shown · IV contrast (gadavist)
Comparison: [DATE] and earlier
COMPARISON: [DATE] and earlier

Addendum:
CLINICAL DATA: Breast cancer staging. Stereotactic guided core
biopsy of calcifications detected on baseline mammogram shows
high-grade ductal carcinoma in situ with focal necrosis and
calcifications in the UPPER-OUTER QUADRANT of the RIGHT breast.

LABS:  None obtained at the time of imaging.
EXAM:
BILATERAL BREAST MRI WITH AND WITHOUT CONTRAST
TECHNIQUE: Multiplanar, multisequence MR images of both breasts were obtained
prior to and following the intravenous administration of 10 ml of
Gadavist

[Series 3: T2 · axial · 3.0mm · 0.89mm/px · z∈[-48,+138]mm · 4 of 61 slices shown]
[im 1/61]
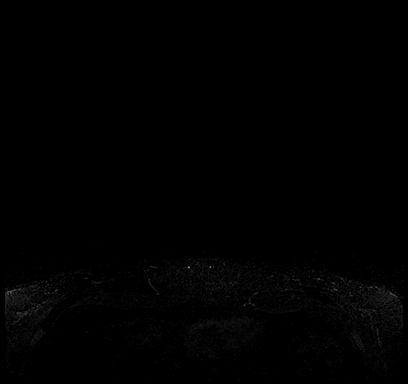
[im 21/61]
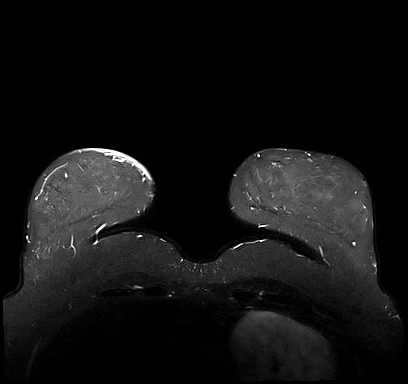
[im 41/61]
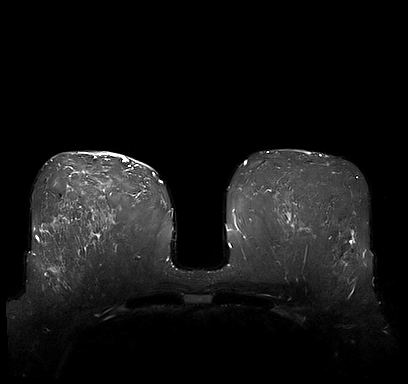
[im 61/61]
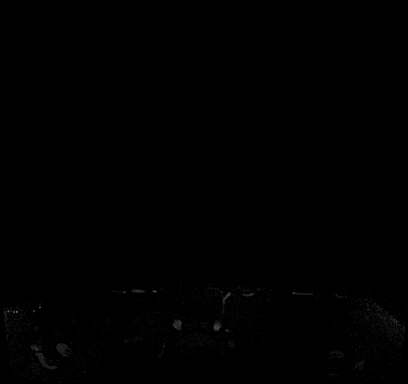

[Series 4: T1 fat-sat · axial · 1.2mm · 0.76mm/px · z∈[-51,+140]mm · 9 of 159 slices shown (1 of 3)]
[im 1/159]
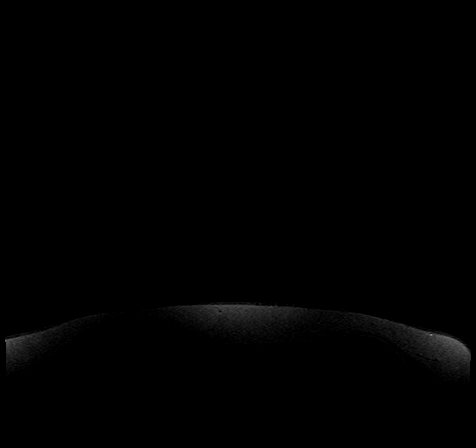
[im 20/159]
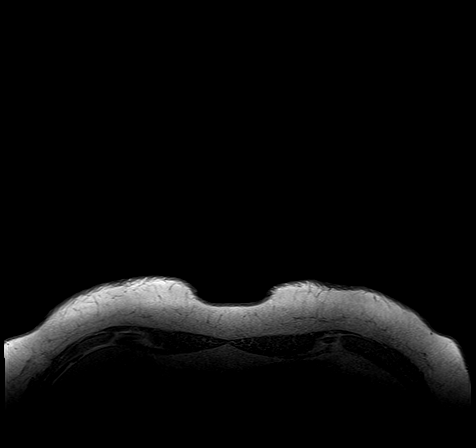
[im 40/159]
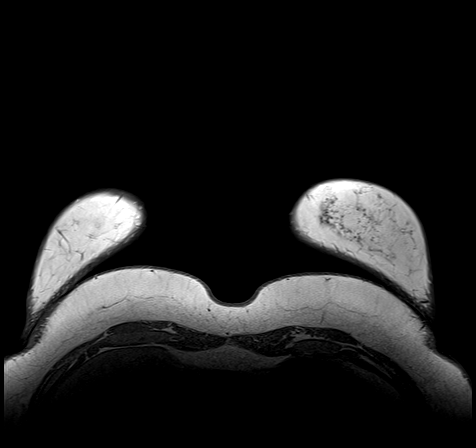
[im 60/159]
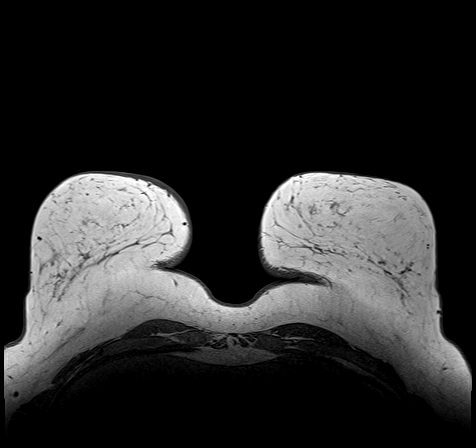
[im 80/159]
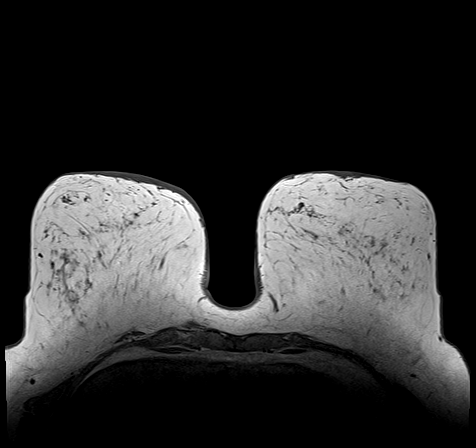
[im 99/159]
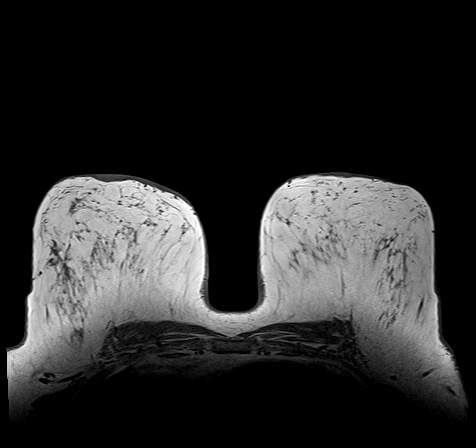
[im 119/159]
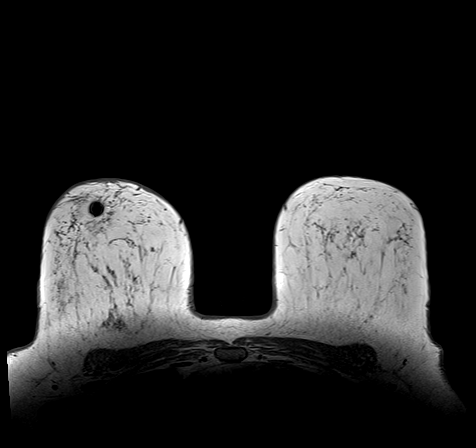
[im 139/159]
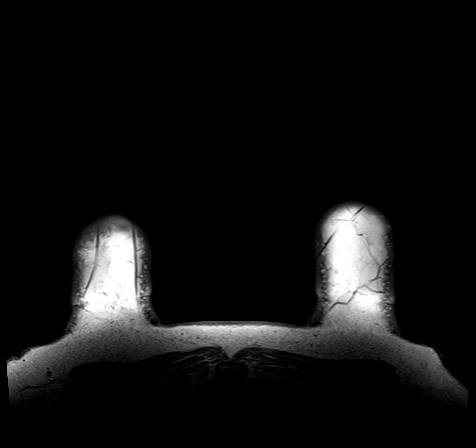
[im 159/159]
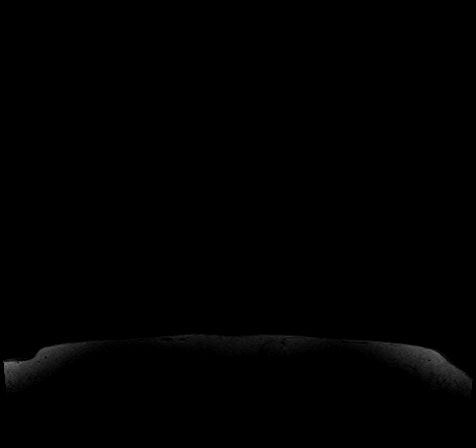

[Series 6: T1 fat-sat · axial · 1.6mm · 0.87mm/px · z∈[-37,+141]mm · 5 of 110 slices shown (2 of 3)]
[im 1/110]
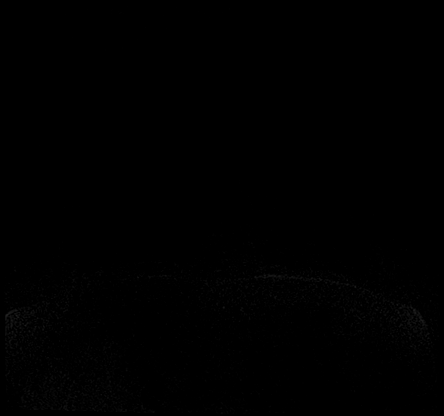
[im 28/110]
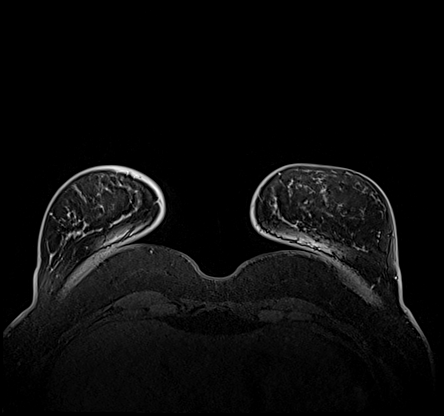
[im 55/110]
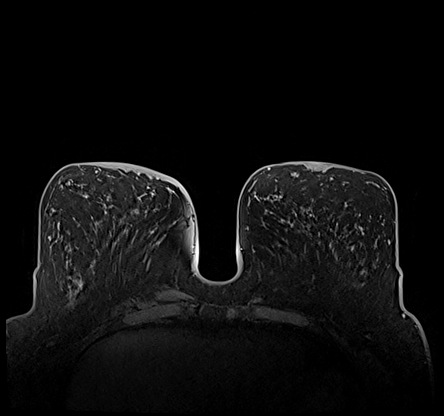
[im 82/110]
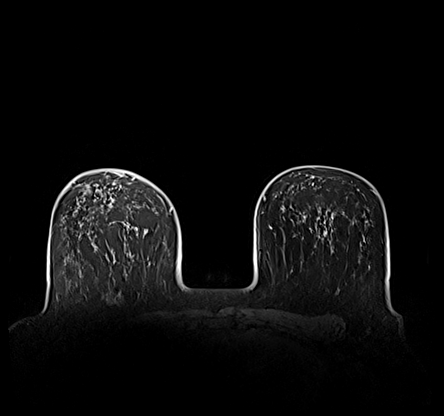
[im 110/110]
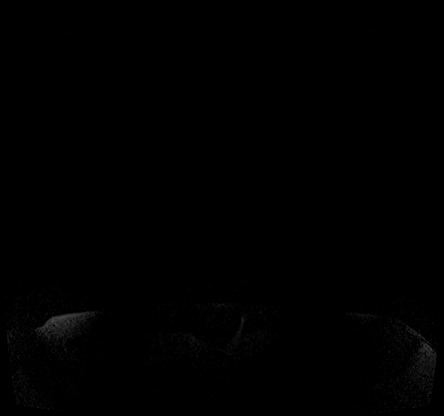

[Series 7: T1 fat-sat · axial · 1.6mm · 0.87mm/px · z∈[-37,+141]mm · 5 of 110 slices shown (3 of 3)]
[im 1/110]
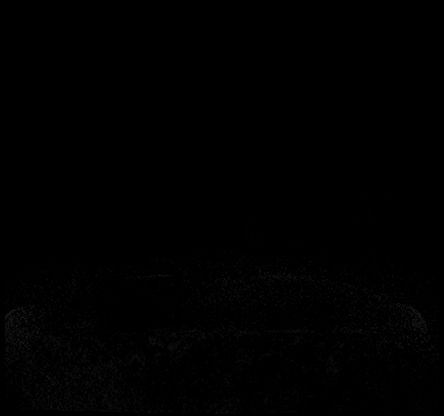
[im 28/110]
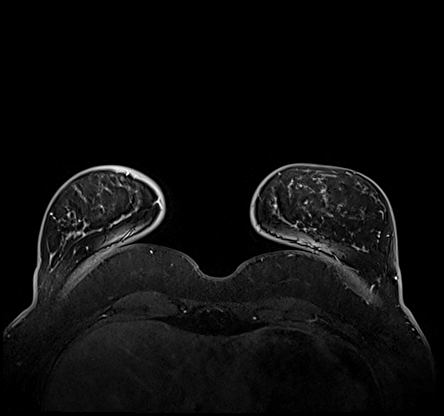
[im 55/110]
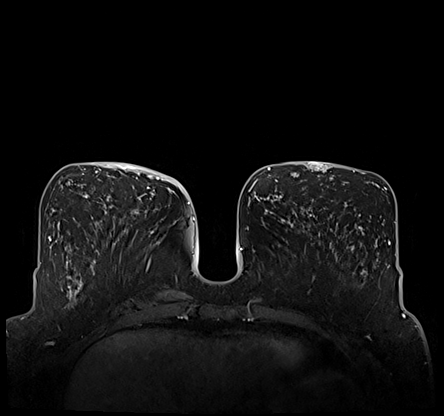
[im 82/110]
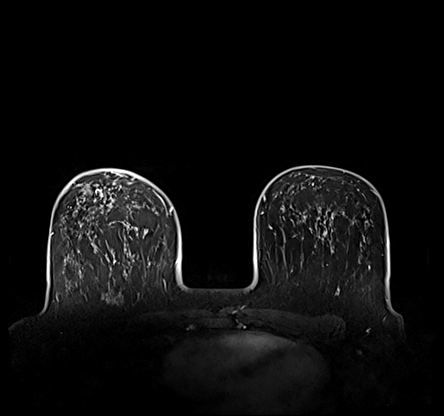
[im 110/110]
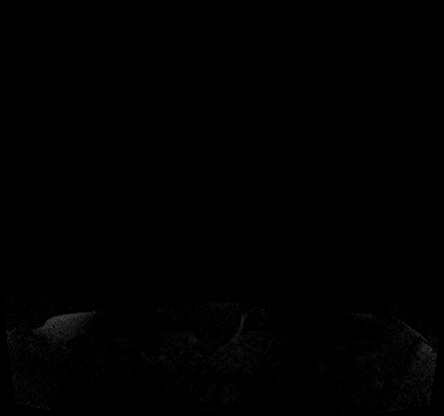

[23 of 48 positions shown; findings below may reference images not displayed]

Three-dimensional MR images were rendered by post-processing of the
original MR data on an independent workstation. The
three-dimensional MR images were interpreted, and findings are
reported in the following complete MRI report for this study. Three
dimensional images were evaluated at the independent interpreting
workstation using the DynaCAD thin client.
FINDINGS: Breast composition: b. Scattered fibroglandular tissue.

Background parenchymal enhancement: Mild

Right breast: Post biopsy changes are identified in the anterior
UPPER OUTER QUADRANT of the RIGHT breast. In this region there is
tissue marker clip artifact and biopsy tract. There is mild
enhancement along the biopsy tract but no significant additional
parenchymal enhancement in this region. There are scattered foci of
enhancement and scattered fibrocystic changes.

Left breast: Within the posterior UPPER INNER QUADRANT of the RIGHT
breast, there is an irregular mass measuring 5 millimeters in
diameter. Mass shows persistent type enhancement kinetics and is T2
bright. This lesion is a likely correlate for the lesion seen
mammographically in the superior portion of the LEFT breast. (Image
36 of series 8)

There are additional scattered foci of enhancement and scattered
fibrocystic changes, none with suspicious features on MRI.

Lymph nodes: No abnormal appearing lymph nodes.

Ancillary findings:  None.
IMPRESSION: 1. Expected post biopsy changes in the anterior UPPER OUTER QUADRANT
of the RIGHT breast.
2. Indeterminate irregular mass in the posterior UPPER INNER
QUADRANT of the LEFT breast for which MR guided core biopsy is
recommended. (Image 36 of series 8)

RECOMMENDATION:
1. Treatment plan for known ductal carcinoma in situ of the RIGHT
breast.
2. Recommend MR guided core biopsy of enhancing mass in the
posterior UPPER INNER QUADRANT of the LEFT breast.

BI-RADS CATEGORY  4: Suspicious.

ADDENDUM:
Correction to report:

Left breast: Within the posterior UPPER INNER QUADRANT of the LEFT
breast, there is an irregular mass measuring 5 millimeters in
diameter. Mass shows.

ADDENDUM to report:

The patient returned today for stereotactic guided core biopsy of 2
sites in the LEFT breast. Based on clip films, the more superior
site biopsied today and marked with a coil shaped clip is likely the
enhancing mass identified in the UPPER-OUTER QUADRANT of the LEFT
breast on MRI.

Recommend the patient return to MRI for possible biopsy of the left
breast. Recommend obtaining non-fat sat T1 weighted images prior to
contrast administration and targeting to determine if the tissue
marker clip is within the lesion in question. If so, biopsy can be
canceled. If the lesions are discrete, recommend MR guided core
biopsy.

*** End of Addendum ***
Three-dimensional MR images were rendered by post-processing of the
original MR data on an independent workstation. The
three-dimensional MR images were interpreted, and findings are
reported in the following complete MRI report for this study. Three
dimensional images were evaluated at the independent interpreting
workstation using the DynaCAD thin client.
FINDINGS: Breast composition: b. Scattered fibroglandular tissue.

Background parenchymal enhancement: Mild

Right breast: Post biopsy changes are identified in the anterior
UPPER OUTER QUADRANT of the RIGHT breast. In this region there is
tissue marker clip artifact and biopsy tract. There is mild
enhancement along the biopsy tract but no significant additional
parenchymal enhancement in this region. There are scattered foci of
enhancement and scattered fibrocystic changes.

Left breast: Within the posterior UPPER INNER QUADRANT of the RIGHT
breast, there is an irregular mass measuring 5 millimeters in
diameter. Mass shows persistent type enhancement kinetics and is T2
bright. This lesion is a likely correlate for the lesion seen
mammographically in the superior portion of the LEFT breast. (Image
36 of series 8)

There are additional scattered foci of enhancement and scattered
fibrocystic changes, none with suspicious features on MRI.

Lymph nodes: No abnormal appearing lymph nodes.

Ancillary findings:  None.
IMPRESSION: 1. Expected post biopsy changes in the anterior UPPER OUTER QUADRANT
of the RIGHT breast.
2. Indeterminate irregular mass in the posterior UPPER INNER
QUADRANT of the LEFT breast for which MR guided core biopsy is
recommended. (Image 36 of series 8)

RECOMMENDATION:
1. Treatment plan for known ductal carcinoma in situ of the RIGHT
breast.
2. Recommend MR guided core biopsy of enhancing mass in the
posterior UPPER INNER QUADRANT of the LEFT breast.

BI-RADS CATEGORY  4: Suspicious.

## 2020-04-22 MED ORDER — GADOBUTROL 1 MMOL/ML IV SOLN
10.0000 mL | Freq: Once | INTRAVENOUS | Status: AC | PRN
  Administered 2020-04-22: 10 mL via INTRAVENOUS

## 2020-04-24 ENCOUNTER — Telehealth: Payer: Self-pay | Admitting: *Deleted

## 2020-04-24 ENCOUNTER — Ambulatory Visit
Admission: RE | Admit: 2020-04-24 | Discharge: 2020-04-24 | Disposition: A | Discharge status: Home / Self Care | Payer: 59 | Source: Ambulatory Visit | Attending: Family Medicine | Admitting: Family Medicine

## 2020-04-24 ENCOUNTER — Encounter: Payer: Self-pay | Admitting: *Deleted

## 2020-04-24 ENCOUNTER — Other Ambulatory Visit: Payer: Self-pay

## 2020-04-24 DIAGNOSIS — C50911 Malignant neoplasm of unspecified site of right female breast: Secondary | ICD-10-CM

## 2020-04-24 HISTORY — PX: BREAST BIOPSY: SHX20

## 2020-04-24 IMAGING — MG MM BREAST LOCALIZATION CLIP
4 series · 4 of 12 positions shown · non-contrast
Comparison: Previous exam(s).

CLINICAL DATA: Evaluate biopsy markers

EXAM:
DIAGNOSTIC LEFT MAMMOGRAM POST STEREOTACTIC BIOPSIES

[L LM synth-2D]
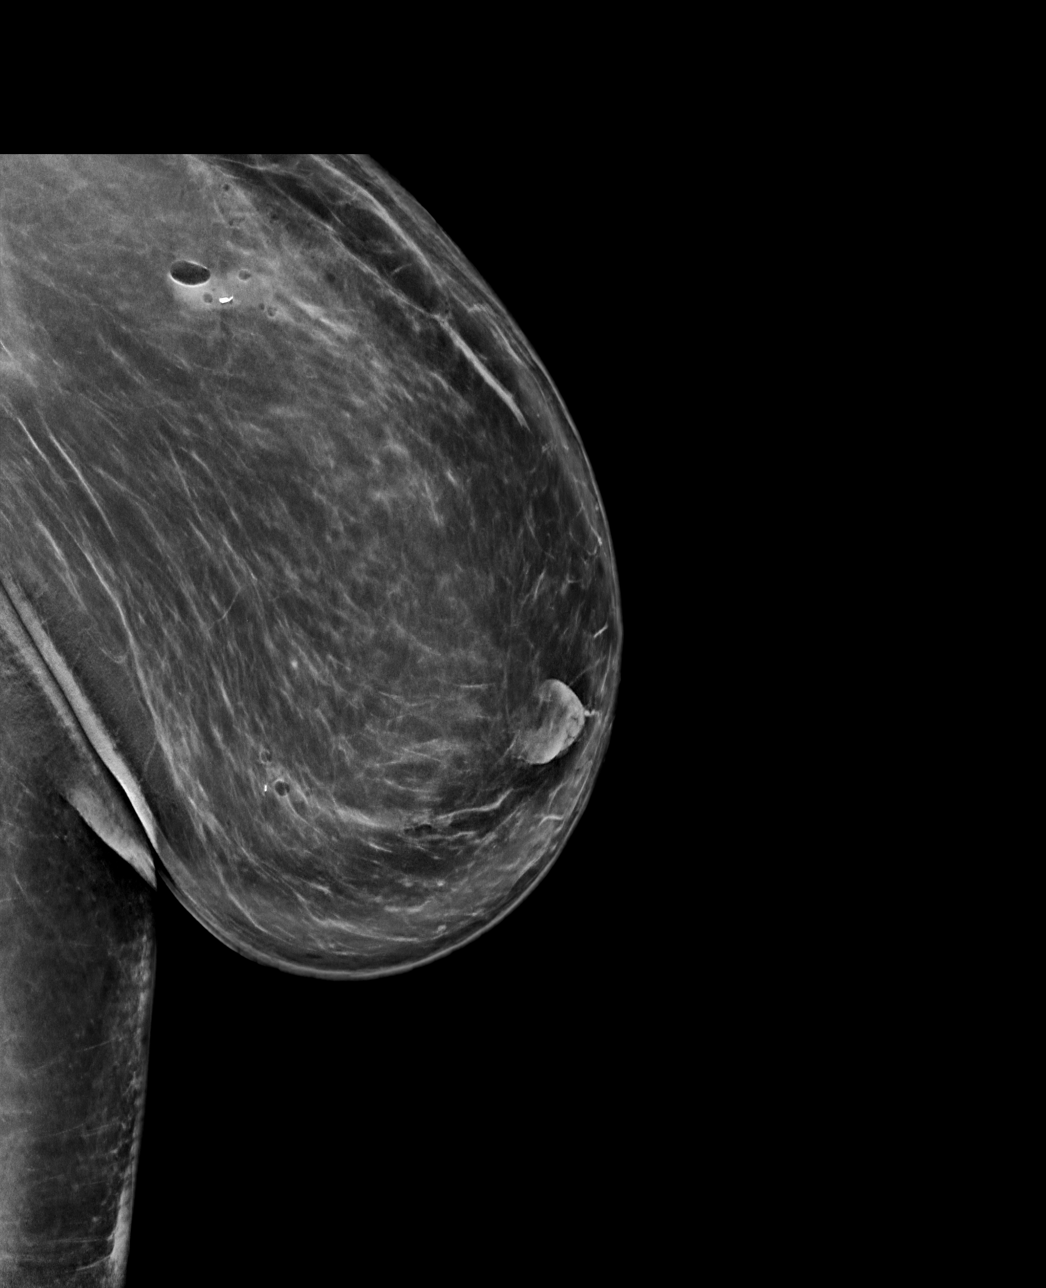

[L CC synth-2D]
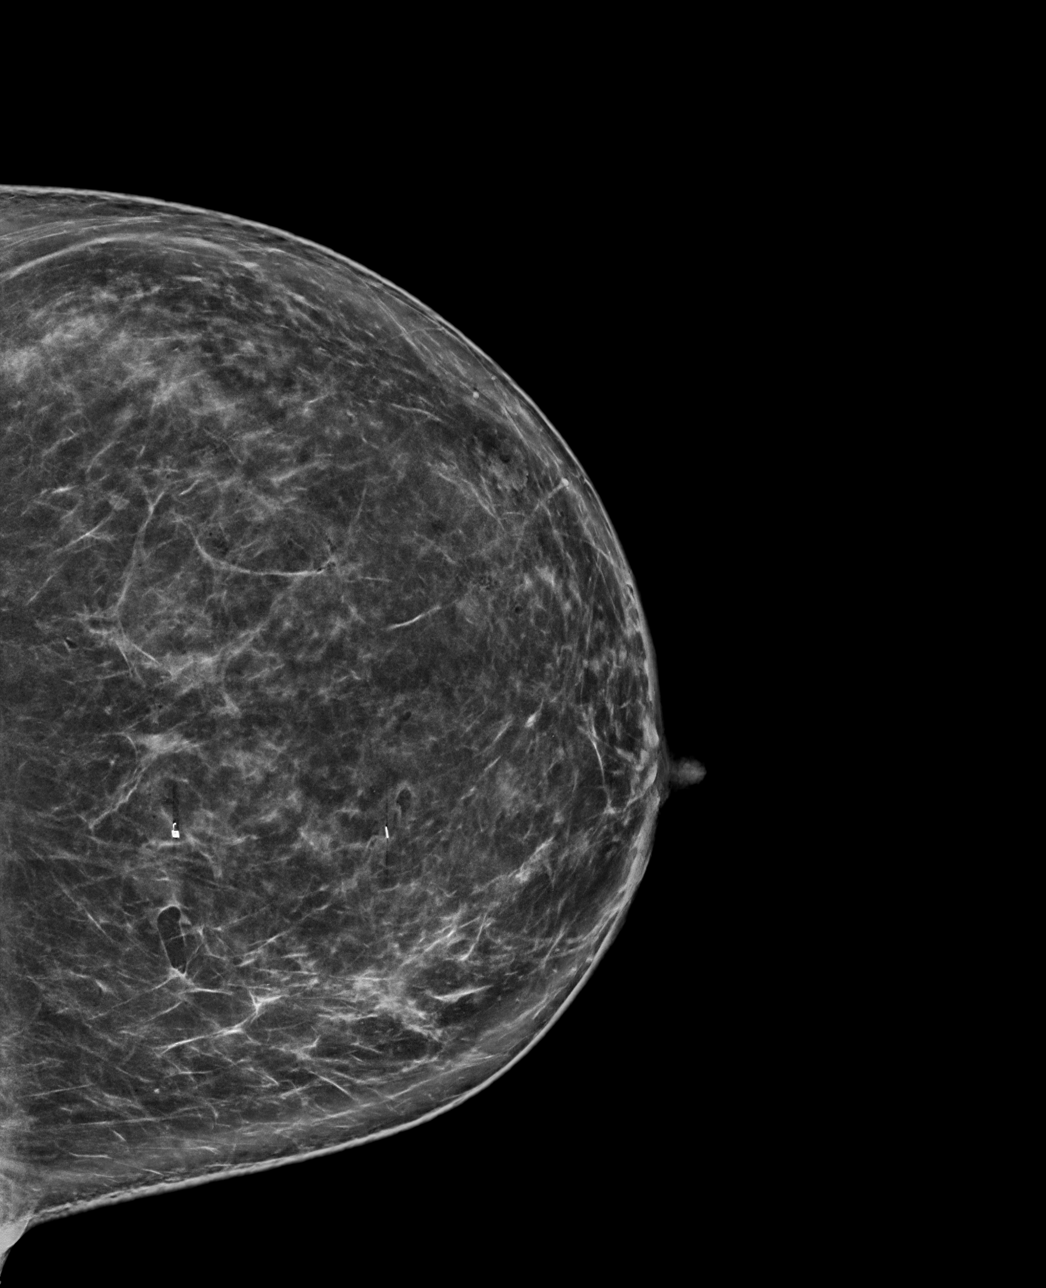

[L LM tomo · tomo slice 64/127.0]
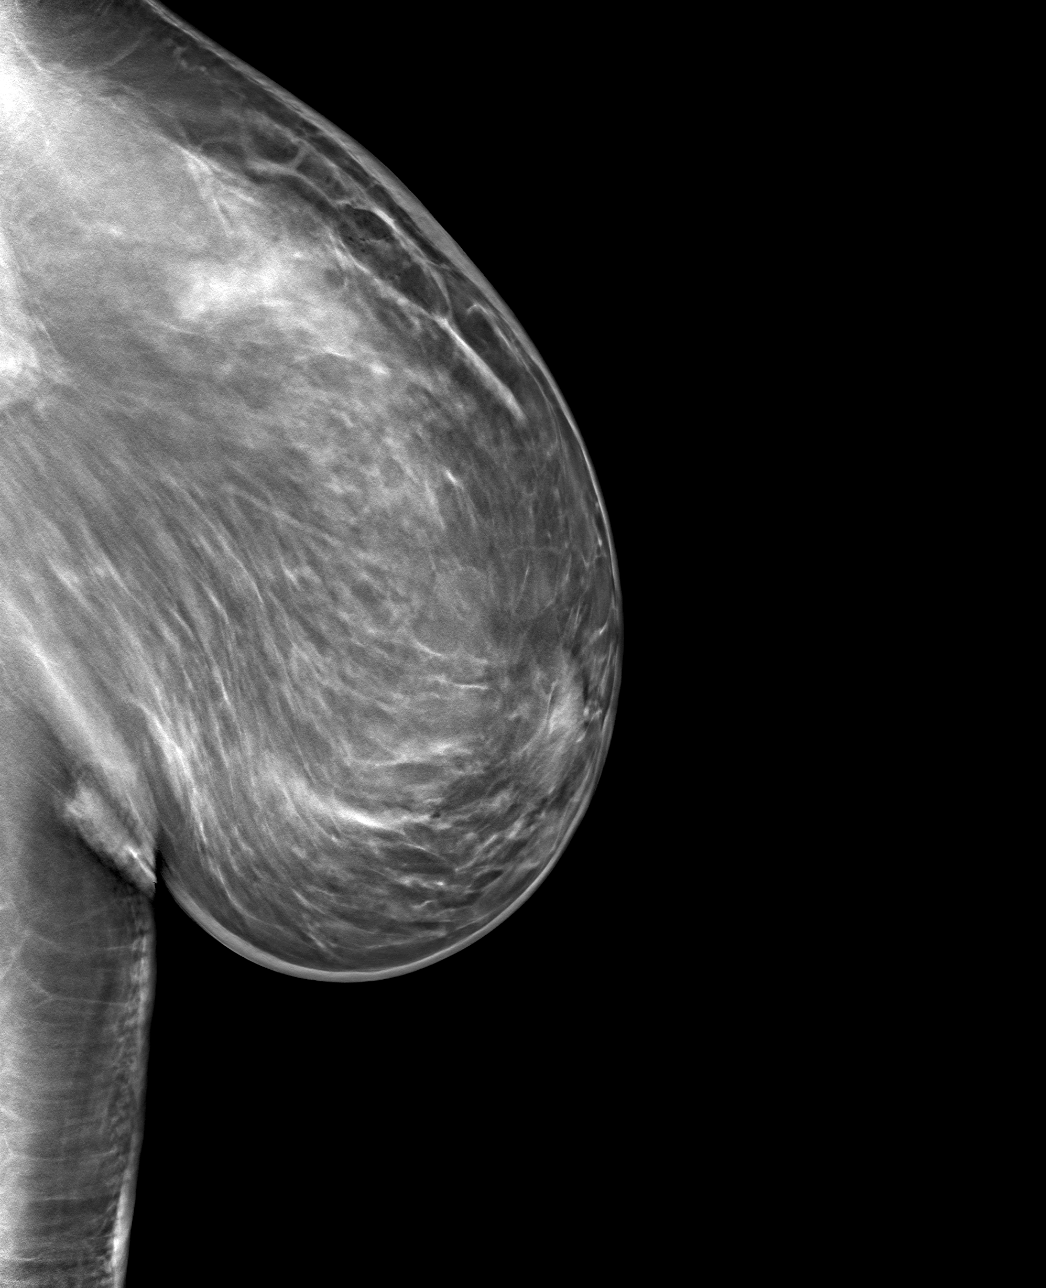

[L CC tomo · tomo slice 47/92.0]
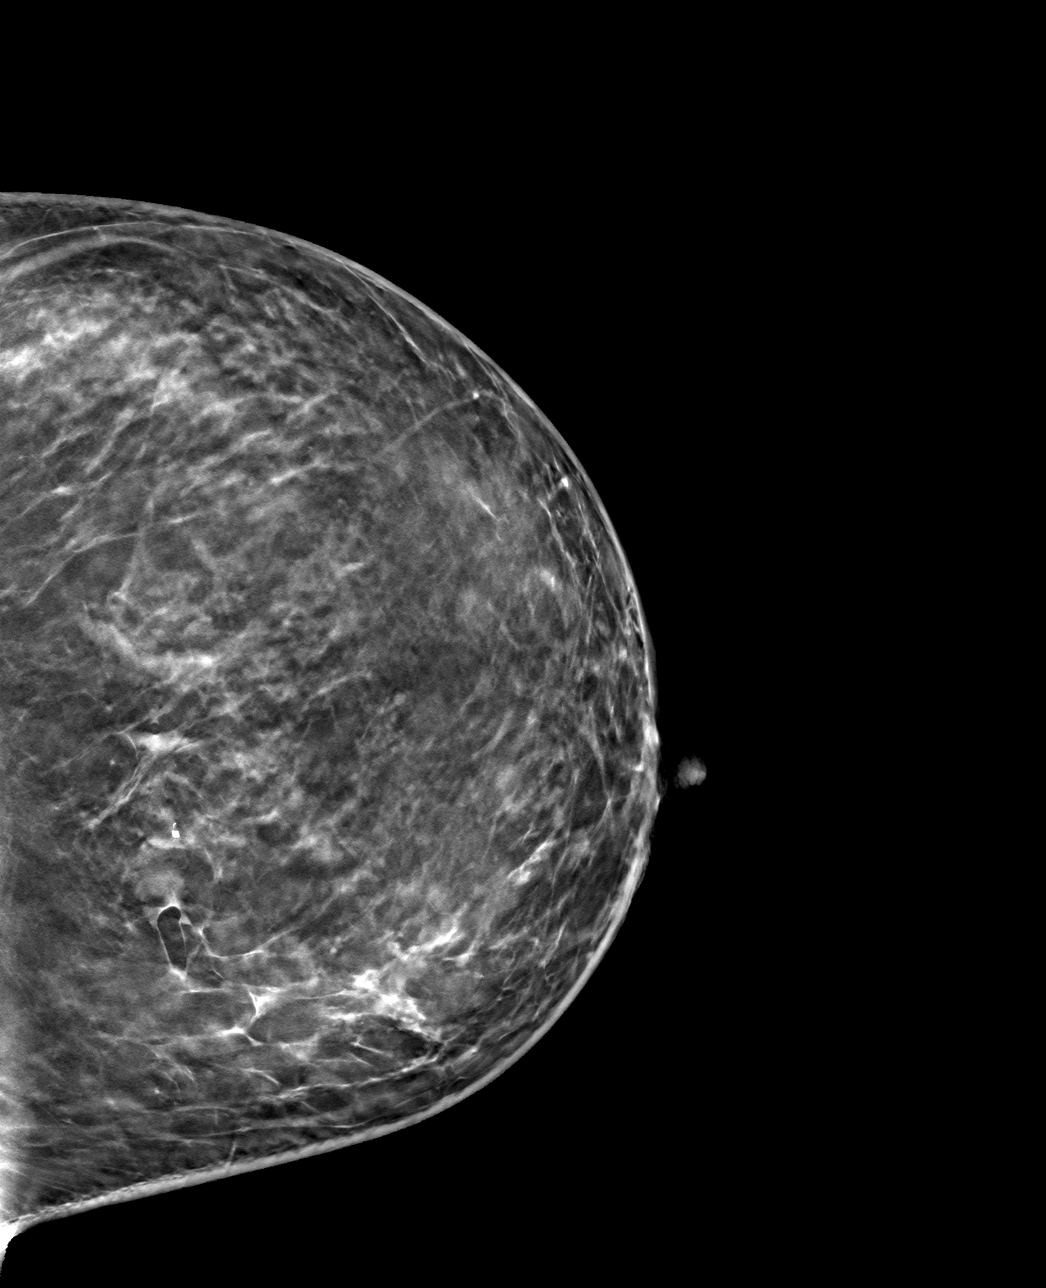

[4 of 12 positions shown; findings below may reference images not displayed]

FINDINGS: Mammographic images were obtained following stereotactic guided
biopsy of of 2 left breast masses. The coil shaped clip is at the
site of the mass in the upper inner left breast. The X shaped clip
is at the site of the mass in the lower inner left breast.
IMPRESSION: Appropriate positioning of the coil and X shaped biopsy marking clip
at the site of both biopsies.

Final Assessment: Post Procedure Mammograms for Marker Placement

## 2020-04-24 IMAGING — MG MM BREAST BX W LOC DEV 1ST LESION IMAGE BX SPEC STEREO GUIDE*L*
7 of 10 series · 7 of 22 positions shown · non-contrast
Comparison: Previous exams.
COMPARISON: Previous exams.

Addendum:
CLINICAL DATA: Biopsy of 2 left breast masses.

EXAM:
LEFT BREAST STEREOTACTIC CORE NEEDLE BIOPSIES

[L (1 of 6)]
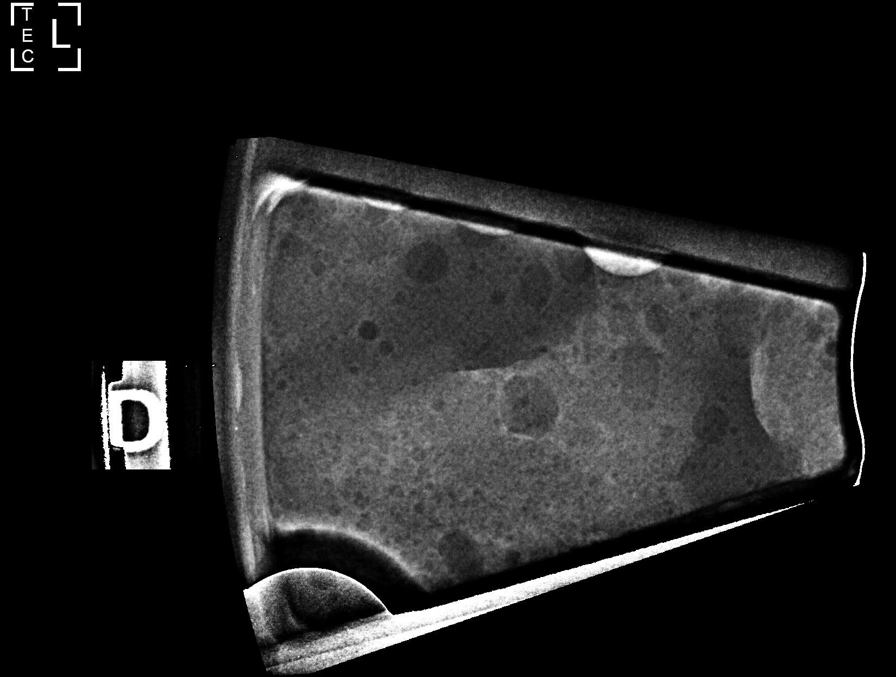

[L (2 of 6)]
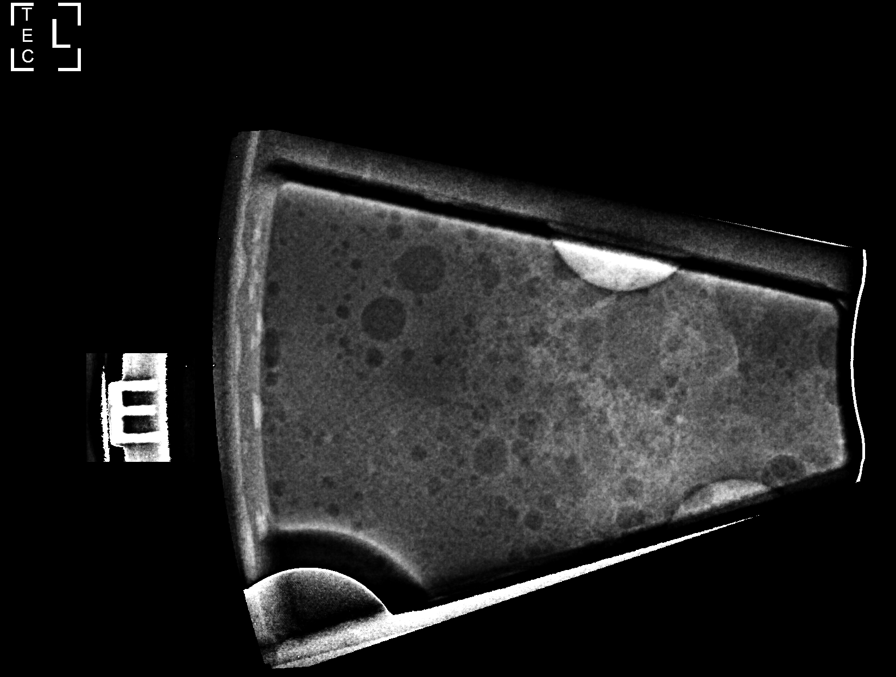

[L (3 of 6)]
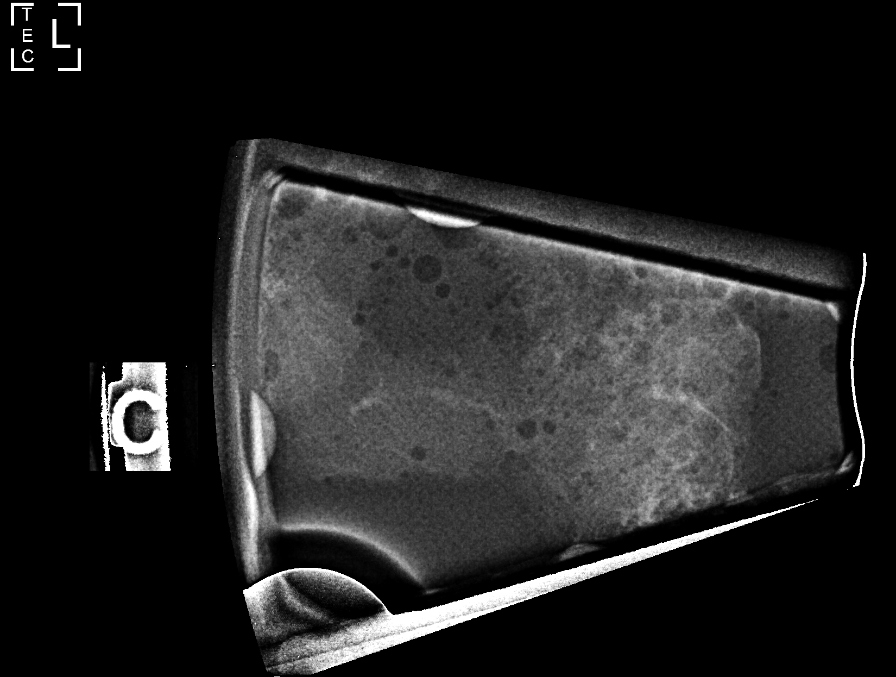

[L (4 of 6)]
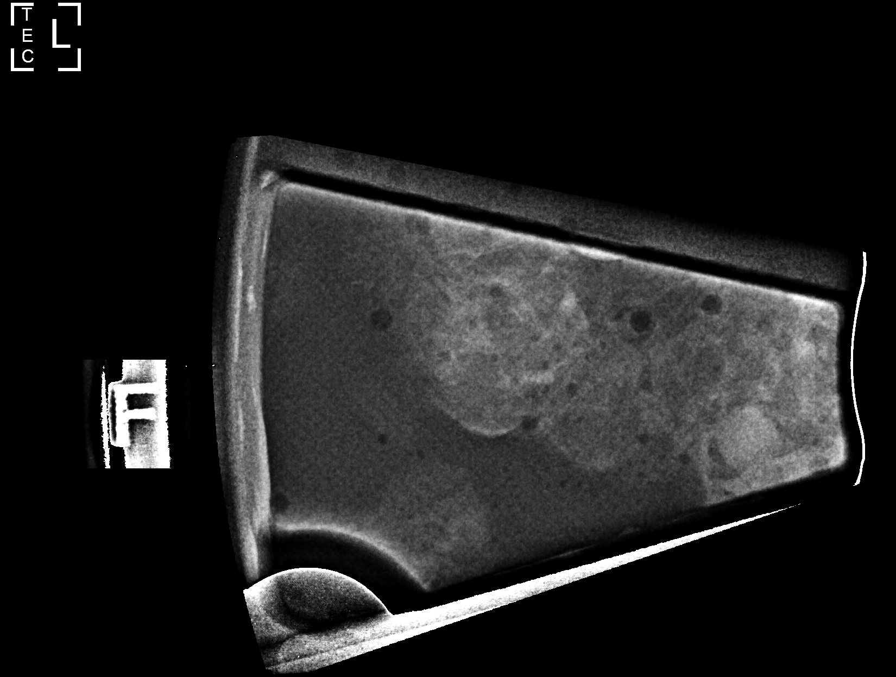

[L (5 of 6)]
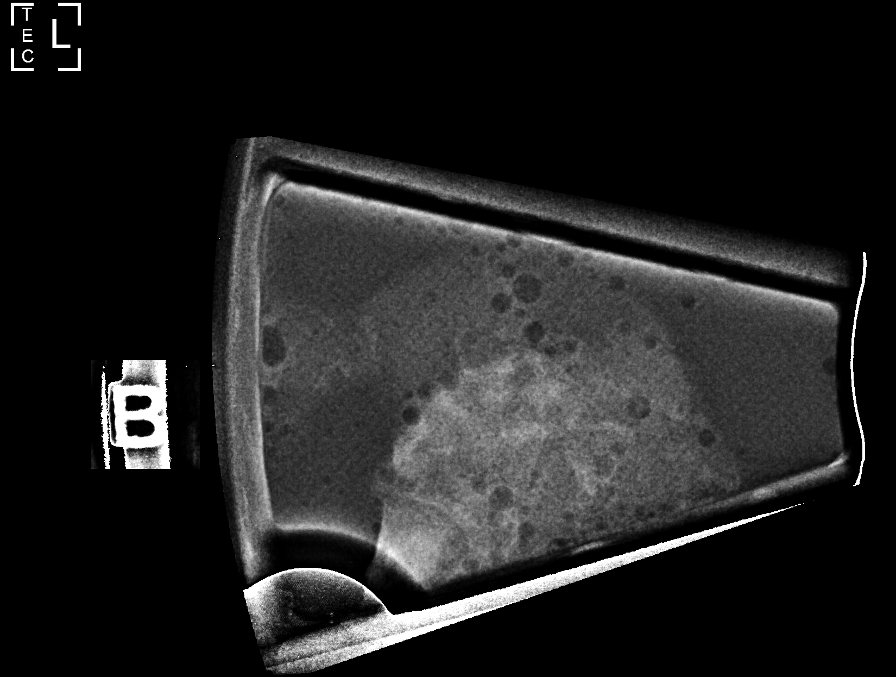

[L (6 of 6)]
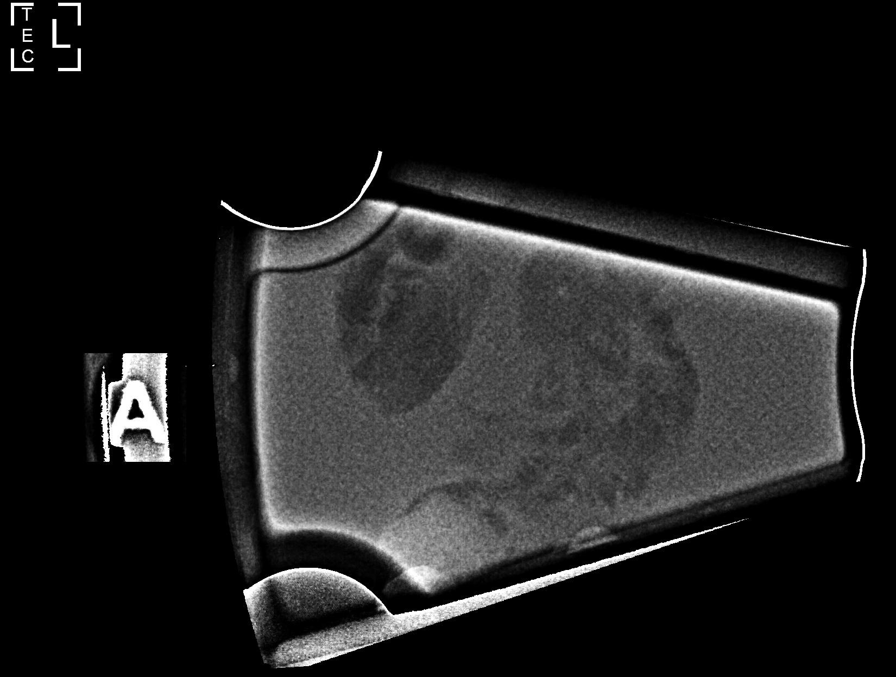

[L LM]
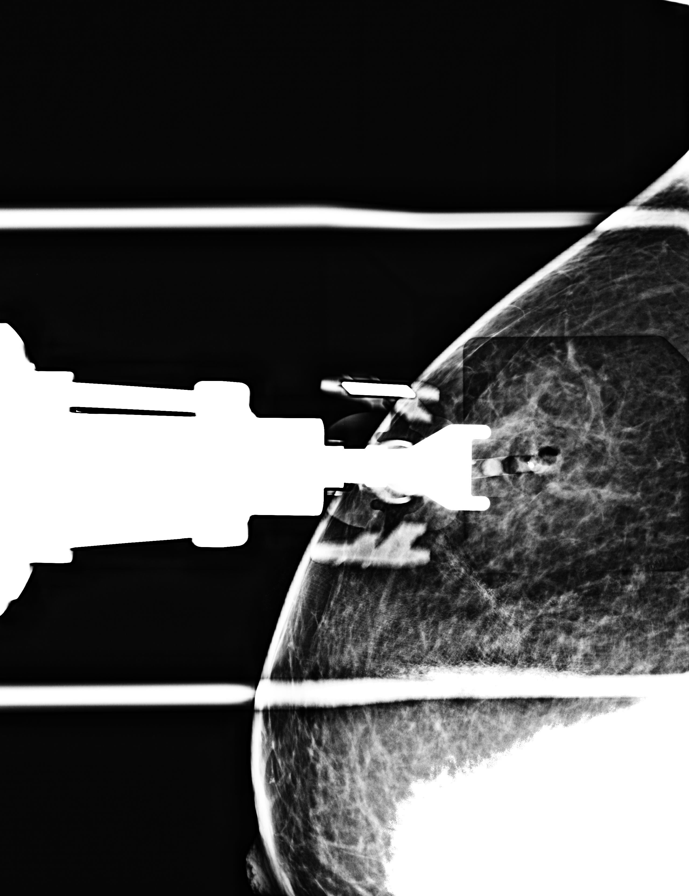

[7 of 22 positions shown; findings below may reference images not displayed]



Using sterile technique and 1% Lidocaine as local anesthetic, under
stereotactic guidance, a 9 gauge vacuum assisted device was used to
perform core needle biopsy of the mass in the upper inner left
breast using a lateral approach.

Lesion quadrant: Upper inner

At the conclusion of the procedure, coil shaped tissue marker clip
was deployed into the biopsy cavity. Follow-up 2-view mammogram was
performed and dictated separately.

Using sterile technique and 1% Lidocaine as local anesthetic, under
stereotactic guidance, a 9 gauge vacuum assisted device was used to
perform core needle biopsy of a mass in the lower inner left breast
using a lateral approach.

Lesion quadrant: Lower-inner

At the conclusion of the procedure, X shaped tissue marker clip was
deployed into the biopsy cavity. Follow-up 2-view mammogram was
performed and dictated separately.
IMPRESSION: Stereotactic-guided biopsy of 2 left breast masses as above. No
apparent complications.

ADDENDUM:
Pathology revealed FIBROADENOMATOID CHANGE of the LEFT breast, upper
inner. This was found to be concordant by Dr. EVER JESUS.

Pathology revealed FIBROCYSTIC CHANGE WITH APOCRINE METAPLASIA of
the LEFT breast, lower inner. This was found to be concordant by Dr.
EVER JESUS.

Pathology results were discussed with the patient by telephone. The
patient reported doing well after the biopsies with tenderness at
the sites. Post biopsy instructions and care were reviewed and
questions were answered. The patient was encouraged to call The
direct phone number was provided.

The patient has a recent diagnosis of RIGHT breast cancer and should
follow her outlined treatment plan.

The patient is scheduled for a LEFT breast MRI guided biopsy on
[DATE].

NOTE: Recommend obtaining T 2 weighted images prior to contrast
administration and targeting to determine if the tissue marker clip
is within the lesion in question. If so, biopsy can be canceled. If
the lesions are discrete, recommend LEFT MRI guided core biopsy.

Pathology results reported by EVER JESUS, RN on [DATE].



Using sterile technique and 1% Lidocaine as local anesthetic, under
stereotactic guidance, a 9 gauge vacuum assisted device was used to
perform core needle biopsy of the mass in the upper inner left
breast using a lateral approach.

Lesion quadrant: Upper inner

At the conclusion of the procedure, coil shaped tissue marker clip
was deployed into the biopsy cavity. Follow-up 2-view mammogram was
performed and dictated separately.

Using sterile technique and 1% Lidocaine as local anesthetic, under
stereotactic guidance, a 9 gauge vacuum assisted device was used to
perform core needle biopsy of a mass in the lower inner left breast
using a lateral approach.

Lesion quadrant: Lower-inner

At the conclusion of the procedure, X shaped tissue marker clip was
deployed into the biopsy cavity. Follow-up 2-view mammogram was
performed and dictated separately.
IMPRESSION: Stereotactic-guided biopsy of 2 left breast masses as above. No
apparent complications.

## 2020-04-24 IMAGING — MG MM BREAST BX W LOC DEV EA AD LESION IMG BX SPEC STEREO GUIDE*L*
7 of 12 series · 7 of 32 positions shown · non-contrast
Comparison: Previous exams.
COMPARISON: Previous exams.

Addendum:
CLINICAL DATA: Biopsy of 2 left breast masses.

EXAM:
LEFT BREAST STEREOTACTIC CORE NEEDLE BIOPSIES

[L (1 of 6)]
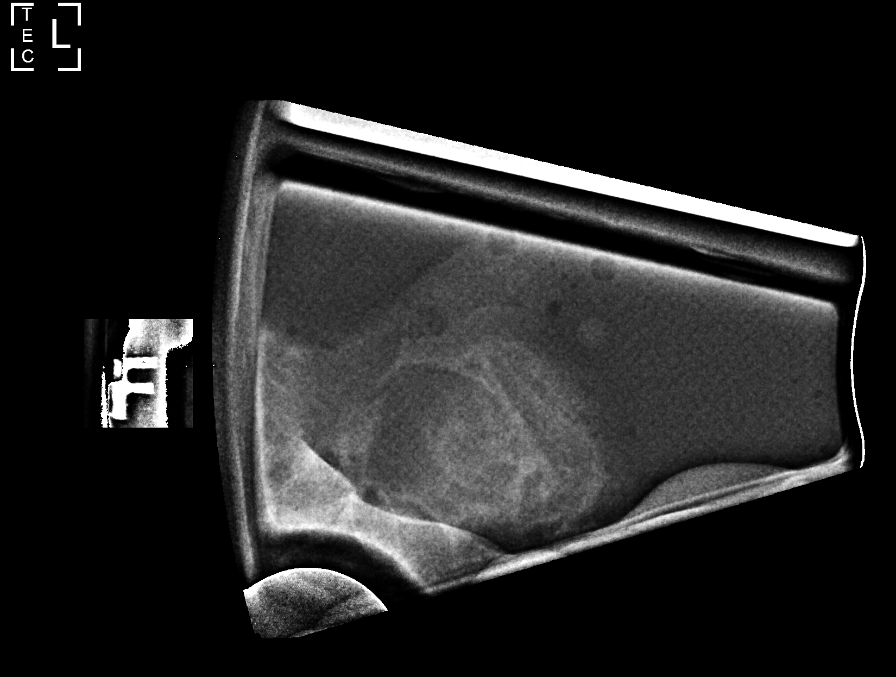

[L (2 of 6)]
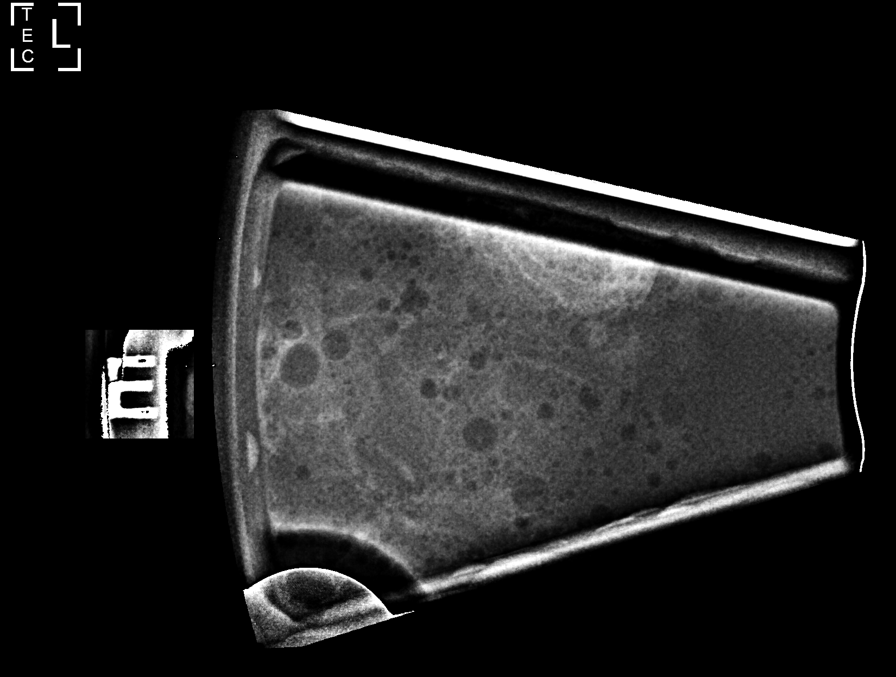

[L (3 of 6)]
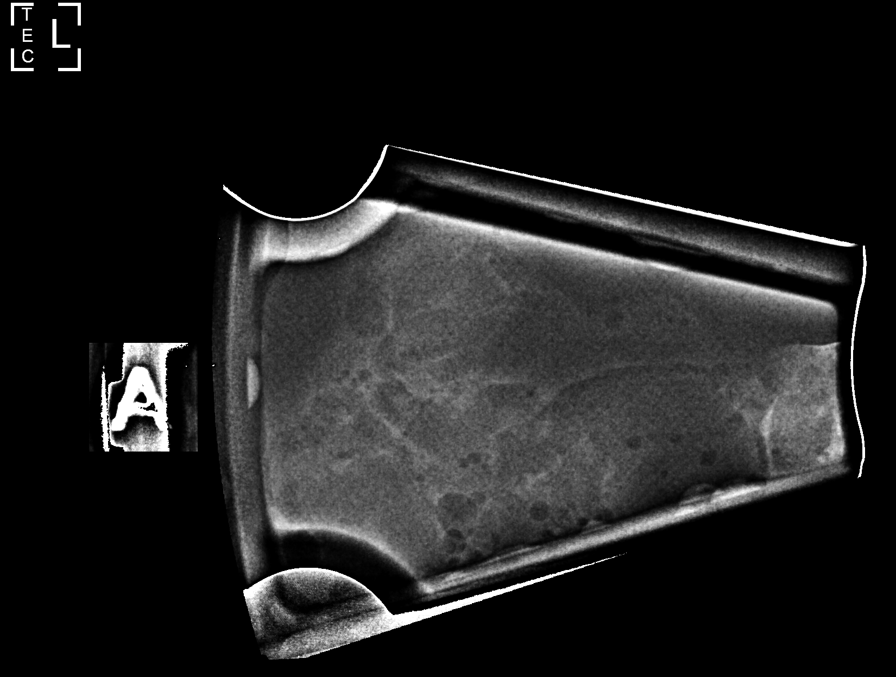

[L (4 of 6)]
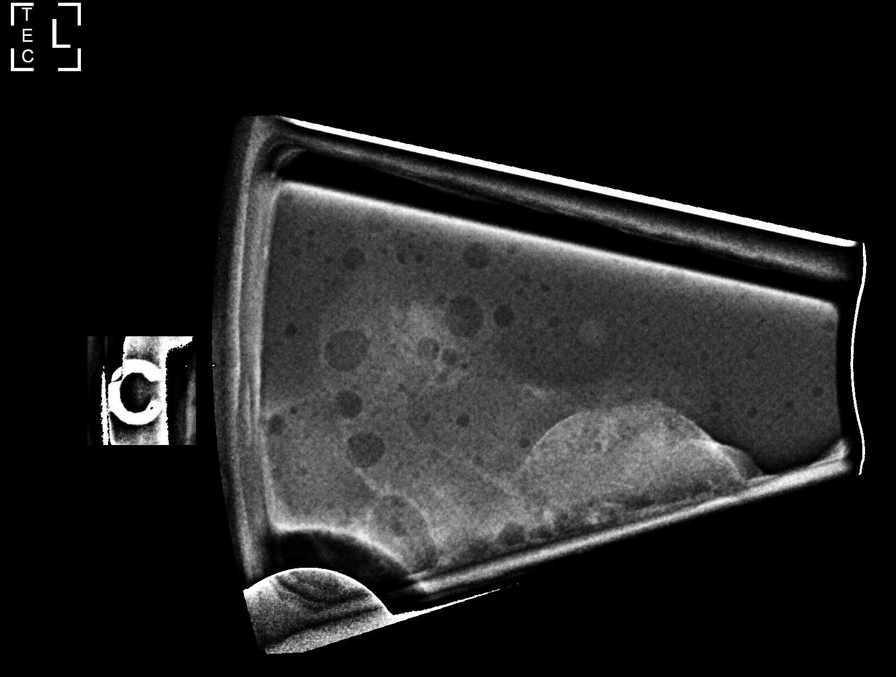

[L (5 of 6)]
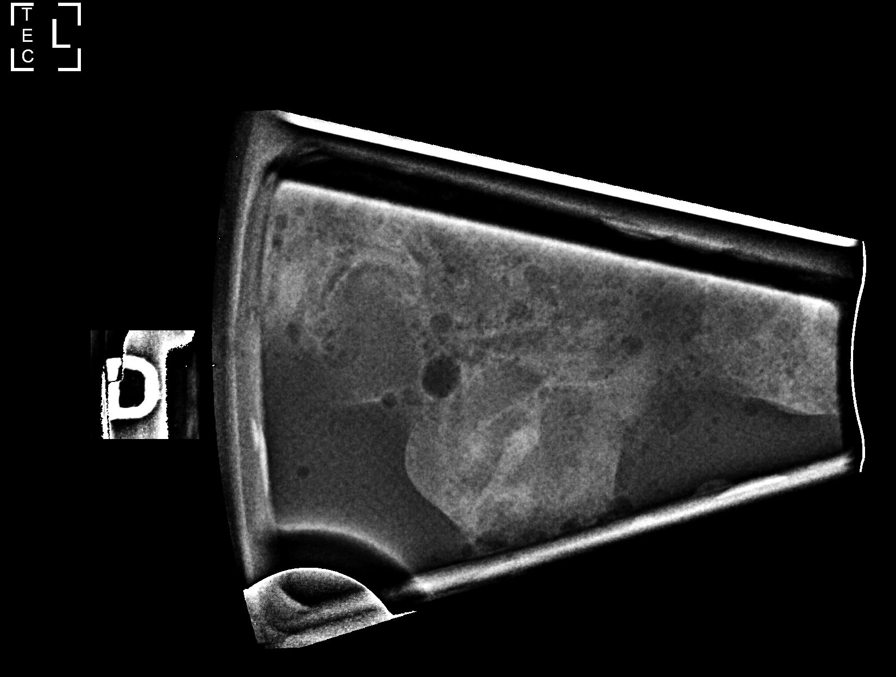

[L (6 of 6)]
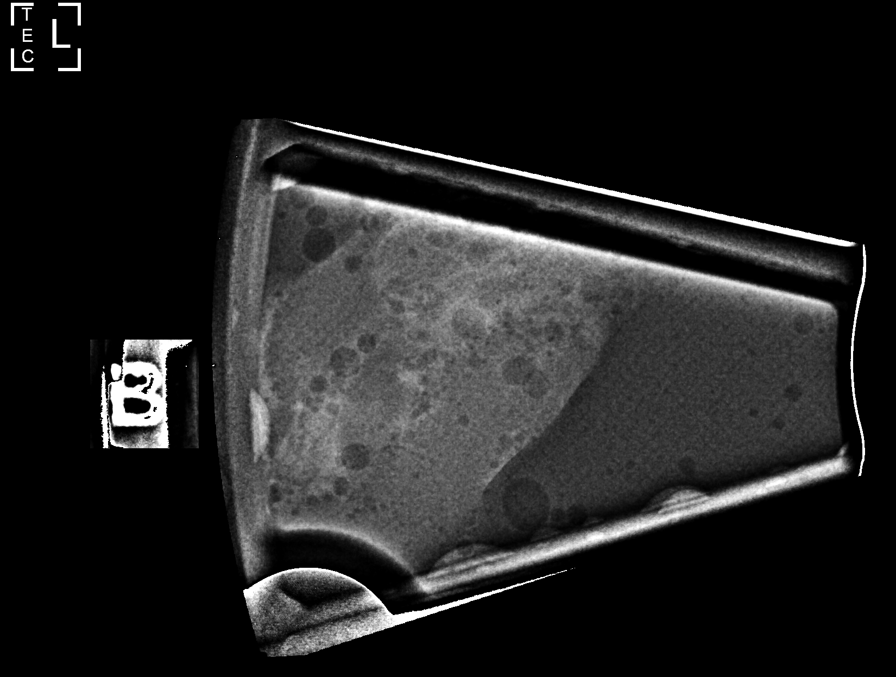

[L LM]
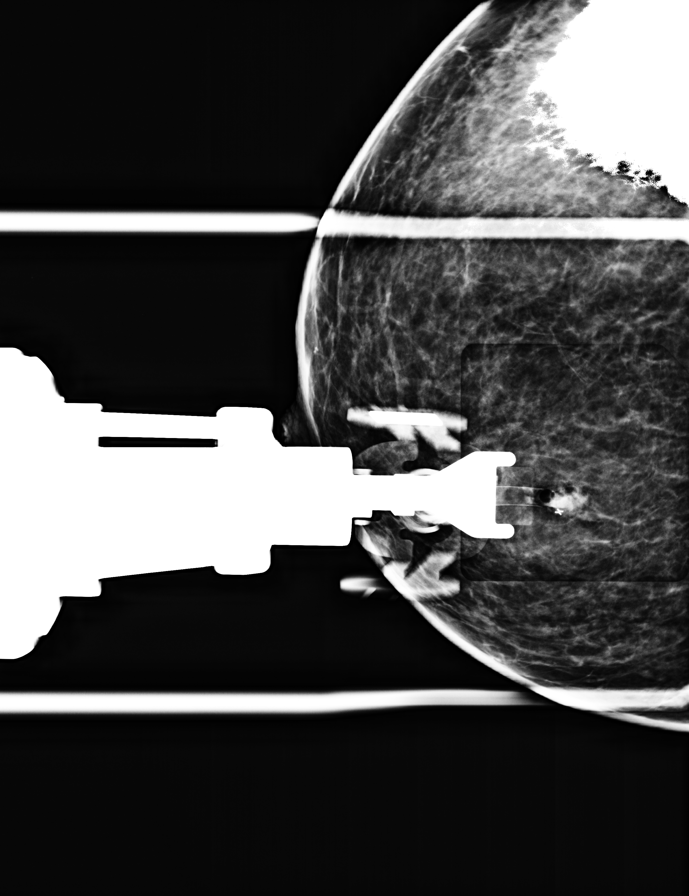

[7 of 32 positions shown; findings below may reference images not displayed]



Using sterile technique and 1% Lidocaine as local anesthetic, under
stereotactic guidance, a 9 gauge vacuum assisted device was used to
perform core needle biopsy of the mass in the upper inner left
breast using a lateral approach.

Lesion quadrant: Upper inner

At the conclusion of the procedure, coil shaped tissue marker clip
was deployed into the biopsy cavity. Follow-up 2-view mammogram was
performed and dictated separately.

Using sterile technique and 1% Lidocaine as local anesthetic, under
stereotactic guidance, a 9 gauge vacuum assisted device was used to
perform core needle biopsy of a mass in the lower inner left breast
using a lateral approach.

Lesion quadrant: Lower-inner

At the conclusion of the procedure, X shaped tissue marker clip was
deployed into the biopsy cavity. Follow-up 2-view mammogram was
performed and dictated separately.
IMPRESSION: Stereotactic-guided biopsy of 2 left breast masses as above. No
apparent complications.

ADDENDUM:
Pathology revealed FIBROADENOMATOID CHANGE of the LEFT breast, upper
inner. This was found to be concordant by Dr. EVER JESUS.

Pathology revealed FIBROCYSTIC CHANGE WITH APOCRINE METAPLASIA of
the LEFT breast, lower inner. This was found to be concordant by Dr.
EVER JESUS.

Pathology results were discussed with the patient by telephone. The
patient reported doing well after the biopsies with tenderness at
the sites. Post biopsy instructions and care were reviewed and
questions were answered. The patient was encouraged to call The
direct phone number was provided.

The patient has a recent diagnosis of RIGHT breast cancer and should
follow her outlined treatment plan.

The patient is scheduled for a LEFT breast MRI guided biopsy on
[DATE].

NOTE: Recommend obtaining T 2 weighted images prior to contrast
administration and targeting to determine if the tissue marker clip
is within the lesion in question. If so, biopsy can be canceled. If
the lesions are discrete, recommend LEFT MRI guided core biopsy.

Pathology results reported by EVER JESUS, RN on [DATE].



Using sterile technique and 1% Lidocaine as local anesthetic, under
stereotactic guidance, a 9 gauge vacuum assisted device was used to
perform core needle biopsy of the mass in the upper inner left
breast using a lateral approach.

Lesion quadrant: Upper inner

At the conclusion of the procedure, coil shaped tissue marker clip
was deployed into the biopsy cavity. Follow-up 2-view mammogram was
performed and dictated separately.

Using sterile technique and 1% Lidocaine as local anesthetic, under
stereotactic guidance, a 9 gauge vacuum assisted device was used to
perform core needle biopsy of a mass in the lower inner left breast
using a lateral approach.

Lesion quadrant: Lower-inner

At the conclusion of the procedure, X shaped tissue marker clip was
deployed into the biopsy cavity. Follow-up 2-view mammogram was
performed and dictated separately.
IMPRESSION: Stereotactic-guided biopsy of 2 left breast masses as above. No
apparent complications.

## 2020-04-24 NOTE — Telephone Encounter (Signed)
Left message for a return phone call to follow from [facility] last week.

## 2020-04-25 ENCOUNTER — Encounter: Payer: Self-pay | Admitting: *Deleted

## 2020-04-25 ENCOUNTER — Other Ambulatory Visit: Payer: Self-pay | Admitting: Hematology

## 2020-04-25 ENCOUNTER — Other Ambulatory Visit: Payer: Self-pay | Admitting: *Deleted

## 2020-04-25 ENCOUNTER — Telehealth (INDEPENDENT_AMBULATORY_CARE_PROVIDER_SITE_OTHER): Payer: 59 | Admitting: Psychiatry

## 2020-04-25 ENCOUNTER — Encounter (HOSPITAL_COMMUNITY): Payer: Self-pay | Admitting: Psychiatry

## 2020-04-25 DIAGNOSIS — F3181 Bipolar II disorder: Secondary | ICD-10-CM | POA: Diagnosis not present

## 2020-04-25 DIAGNOSIS — F419 Anxiety disorder, unspecified: Secondary | ICD-10-CM

## 2020-04-25 DIAGNOSIS — D0511 Intraductal carcinoma in situ of right breast: Secondary | ICD-10-CM

## 2020-04-25 MED ORDER — LORAZEPAM 0.5 MG PO TABS: 0.5000 mg | ORAL_TABLET | Freq: Two times a day (BID) | ORAL | 2 refills | Status: DC | PRN

## 2020-04-25 NOTE — Progress Notes (Signed)
[facility] Note  Virtual Visit via Video Note  I connected with [name] on 04/25/20 at 11:20 AM EDT by a video enabled telemedicine application and verified that I am speaking with the correct person using two identifiers.  Location: Patient: Home Provider: Clinic   I discussed the limitations of evaluation and management by telemedicine and the availability of in person appointments. The patient expressed understanding and agreed to proceed.  I provided 17 minutes of non-face-to-face time during this encounter.    Patient Identification: Sue Jackson MRN:  [id number] Date of Evaluation:  04/25/2020    Chief Complaint:   " I have had a rough few months."  Visit Diagnosis:    ICD-10-CM   1. History of Bipolar 2 disorder ([place]), now in remission  F31.81   2. Anxiety  F41.9     History of Present Illness: Patient informed that she was recently diagnosed with early stages of breast cancer after completing her first mammogram.  She stated that she had no symptoms or no findings on exam and after her mammogram was done she was informed that she has early stages of cancer. As per EMR, she has been diagnosed with DCIS.  She underwent biopsy recently and is under the care of an oncologist at [facility]. She stated that when she was given this news on the phone she almost lost it and had to miss that day of work.  She has to undergo surgery and is dealing with it better now. She denied feeling depressed or having any hypomanic or manic symptoms.  She stated that lorazepam has helped her a lot with her anxiety.  She stated that she does not think she needs anything different at this time and would like to continue the lorazepam the way it is prescribed. She is looking forward to undergoing with the procedure.  She did inform that she has been prescribed estrogen and she is having a lot of side effects secondary to it and she is going discussed that with her provider at  the time of her next visit.  Past Psychiatric History: Bipolar depression, anxiety.  History of 1 prior psychiatry hospitalization in 2013 after overdosing on Effexor.  Previous Psychotropic Medications: Yes , Effexor-overdosed, did not like the side effects, Prozac-did not help much, Zoloft-made her feel "crazy", lithium-help with mood but did not like the way it made her feel, Seroquel-cannot recall if she took it regularly.  Substance Abuse History in the last 12 months:  No.  Consequences of Substance Abuse: NA  Past Medical History:  Past Medical History:  Diagnosis Date  . Anxiety   . Arthritis   . Depression   . Hypertension    History reviewed. No pertinent surgical history.  Family Psychiatric History: Maternal aunt- depression  Family History:  Family History  Problem Relation Age of Onset  . Arthritis Mother   . Asthma Mother   . Cancer Maternal Aunt 59       breast  . Depression Maternal Aunt   . Hypertension Maternal Aunt   . Breast cancer Maternal Aunt   . Diabetes Maternal Grandfather   . Heart disease Maternal Grandfather   . Hypertension Maternal Grandfather   . Stroke Maternal Grandfather   . Cancer Maternal Aunt 39       breast cancer     Social History:   Social History   Socioeconomic History  . Marital status: Married    Spouse name: Not on file  .  Number of children: 0  . Years of education: Not on file  . Highest education level: Not on file  Occupational History  . Not on file  Tobacco Use  . Smoking status: Never Smoker  . Smokeless tobacco: Never Used  Vaping Use  . Vaping Use: Never used  Substance and Sexual Activity  . Alcohol use: Yes    Comment: occasionally  . Drug use: Yes    Types: Marijuana  . Sexual activity: Yes    Birth control/protection: None  Other Topics Concern  . Not on file  Social History Narrative  . Not on file   Social Determinants of Health   Financial Resource Strain: Low Risk   . Difficulty of  Paying Living Expenses: Not very hard  Food Insecurity: No Food Insecurity  . Worried About Charity fundraiser in the Last Year: Never true  . Ran Out of Food in the Last Year: Never true  Transportation Needs: No Transportation Needs  . Lack of Transportation (Medical): No  . Lack of Transportation (Non-Medical): No  Physical Activity:   . Days of Exercise per Week: Not on file  . Minutes of Exercise per Session: Not on file  Stress:   . Feeling of Stress : Not on file  Social Connections:   . Frequency of Communication with Friends and Family: Not on file  . Frequency of Social Gatherings with Friends and Family: Not on file  . Attends Religious Services: Not on file  . Active Member of Clubs or Organizations: Not on file  . Attends Archivist Meetings: Not on file  . 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  Lives with her partner/wife.  Does not have any children.  Allergies:  No Known Allergies  Metabolic Disorder Labs: Lab Results  Component Value Date   HGBA1C 6.4 01/30/2020   No results found for: PROLACTIN Lab Results  Component Value Date   CHOL 134 01/30/2020   TRIG 62.0 01/30/2020   HDL 45.50 01/30/2020   CHOLHDL 3 01/30/2020   VLDL 12.4 01/30/2020   LDLCALC 76 01/30/2020   LDLCALC 95 03/11/2017   Lab Results  Component Value Date   TSH 2.51 11/21/2018    Therapeutic Level Labs: No results found for: LITHIUM No results found for: CBMZ No results found for: VALPROATE  Current Medications: Current Outpatient Medications  Medication Sig Dispense Refill  . amLODipine-benazepril (LOTREL) 5-10 MG capsule TAKE 1 CAPSULE BY MOUTH EVERY DAY 90 capsule 2  . fluticasone (FLONASE) 50 MCG/ACT nasal spray Place 1 spray into both nostrils 2 (two) times daily. 16 g 2  . LORazepam (ATIVAN) 0.5 MG tablet Take 1 tablet (0.5 mg total) by mouth 2 (two) times daily as needed for anxiety.  60 tablet 1  . meclizine (ANTIVERT) 25 MG tablet Take 1 tablet (25 mg total) by mouth 2 (two) times daily as needed for dizziness. 30 tablet 0  . propranolol (INDERAL) 40 MG tablet Take 0.5 tablets (20 mg total) by mouth daily. 90 tablet 2   No current facility-administered medications for this visit.    Musculoskeletal: Strength & Muscle Tone: unable to assess due to telemed visit Gait & Station: unable to assess due to telemed visit Patient leans: unable to assess due to telemed visit   Psychiatric Specialty Exam: Review of Systems  There were no vitals taken for this visit.There is no height  or weight on file to calculate BMI.  General Appearance: Fairly Groomed  Eye Contact:  Good  Speech:  Clear and Coherent and Normal Rate  Volume:  Normal  Mood:  Anxious  Affect:  Congruent  Thought Process:  Goal Directed and Descriptions of Associations: Intact  Orientation:  Full (Time, Place, and Person)  Thought Content:  Logical  Suicidal Thoughts:  No  Homicidal Thoughts:  No  Memory:  Immediate;   Good Recent;   Good Remote;   Good  Judgement:  Fair  Insight:  Fair  Psychomotor Activity:  Normal  Concentration:  Concentration: Good and Attention Span: Good  Recall:  Good  Fund of Knowledge:Good  Language: Good  Akathisia:  Negative  Handed:  Right  AIMS (if indicated):  not done  Assets:  Communication Skills Desire for Improvement Financial Resources/Insurance Housing Social Support Vocational/Educational  ADL's:  Intact  Cognition: WNL  Sleep:  Good   PHQ2-9     Oncology Navigator from 04/16/2020 in [facility]  PHQ-2 Total Score 1     Assessment and Plan: Patient recently found out that she has a life-changing health condition.  She is dealing with it fairly well and is scheduled to undergo procedures for the same.  She is under the care of her oncologist at [facility].  1. Anxiety - LORazepam (ATIVAN) 0.5 MG tablet; Take 1  tablet (0.5 mg total) by mouth 2 (two) times daily as needed for anxiety.  Dispense: 60 tablet; Refill: 1   2. History of Bipolar 2 disorder ([place]), now in remission  F/up in 3 months. Patient was informed that she can call the writer's office any time if she wants to talk to the writer sooner than her scheduled appointment.   [place] Crane, MD 9/17/202111:16 AM

## 2020-04-25 NOTE — Progress Notes (Signed)
ll

## 2020-04-29 NOTE — Progress Notes (Signed)
Nutrition  Patient identified by attending Breast Clinic on 04/16/2020.  Patient was given nutrition packet by nurse navigator with RD contact information.   Chart reviewed.   Patient with new diagnosis of DCIS.  Planning lumpectomy, radiation and antiestrogens.   Ht: 66 inches Wt: 222 lb BMI 35  Patient is currently not at nutritional risk.  Please consult RD if nutritional status changes.    Azadeh Hyder B. Zenia Resides, [place], [place] Registered Dietitian [phone] (mobile)

## 2020-05-02 ENCOUNTER — Ambulatory Visit: Payer: 59 | Admitting: Family Medicine

## 2020-05-06 ENCOUNTER — Ambulatory Visit: Payer: 59 | Admitting: Family Medicine

## 2020-05-06 ENCOUNTER — Encounter: Payer: Self-pay | Admitting: Family Medicine

## 2020-05-06 ENCOUNTER — Other Ambulatory Visit: Payer: Self-pay

## 2020-05-06 VITALS — BP 136/80 | HR 98 | Resp 16 | Ht 66.0 in | Wt 223.0 lb

## 2020-05-06 DIAGNOSIS — D0511 Intraductal carcinoma in situ of right breast: Secondary | ICD-10-CM

## 2020-05-06 DIAGNOSIS — R42 Dizziness and giddiness: Secondary | ICD-10-CM | POA: Diagnosis not present

## 2020-05-06 DIAGNOSIS — I10 Essential (primary) hypertension: Secondary | ICD-10-CM

## 2020-05-06 DIAGNOSIS — Z6835 Body mass index (BMI) 35.0-35.9, adult: Secondary | ICD-10-CM

## 2020-05-06 MED ORDER — MECLIZINE HCL 25 MG PO TABS: 25.0000 mg | ORAL_TABLET | Freq: Three times a day (TID) | ORAL | 1 refills | Status: DC | PRN

## 2020-05-06 NOTE — Progress Notes (Signed)
HPI: Sue Jackson is a 39 y.o. female, who is here today for 6 months follow up.   She was last seen on 01/30/20. 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 Lesions have also seen in left breast thought to be benign, MRI pending.  Plan is to perform lumpectomy and radiation. Also recommended Tamoxifen but she is afraid of side effects and has decided not to take it.  Lightheadedness is getting worse. Exacerbated by movement, feels like "the floor is moving." Associated nausea and yesterday noted left tinnitus. No changes in hearing,sore throat,nasal congestion,or rhinorrhea.  It lasts all day, alleviated by sitting down for a few minutes. It does not happen in bed, occasionally when getting up but mild. 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 Meclizine 25 mg helps, she wonders if she can take a higher dose.   Lab Results  Component Value Date   TSH 2.51 11/21/2018   HTN: Negative for severe/frequent headache, visual changes, chest pain, dyspnea, palpitation, focal weakness, or edema. She is on Amlodipine-Benazepril 5-10 mg daily.  Lab Results  Component Value Date   CREATININE 1.11 (H) 04/16/2020   BUN 17 04/16/2020   NA 138 04/16/2020   K 4.2 04/16/2020   CL 105 04/16/2020   CO2 25 04/16/2020   Propranolol 20 mg 1/2 tab every morning to help with anxiety. She is now on Lorazepam 0.5 mg bid as needed.  She is still following with psychiatrist, Dr Toy Care. She has tried to do better with her diet.  She is exercising sometimes, not consistently. She thinks in general she follows a healthful diet. She has decreased carbs intake. Prediabetes. She is checking BS's 113-160's.  Lab Results  Component Value Date   HGBA1C 6.4 01/30/2020   Review of Systems  Constitutional: Positive for fatigue. Negative for activity change and appetite change.  HENT: Negative for mouth  sores and nosebleeds.   Eyes: Negative for redness and visual disturbance.  Respiratory: Negative for cough and wheezing.   Gastrointestinal: Negative for abdominal pain.       Negative for changes in bowel habits.  Genitourinary: Negative for decreased urine volume and hematuria.  Musculoskeletal: Negative for gait problem and myalgias.  Skin: Negative for rash.  Neurological: Negative for syncope, facial asymmetry and weakness.  Psychiatric/Behavioral: Negative for confusion. The patient is nervous/anxious.   Rest of ROS, see pertinent positives sand negatives in HPI  Current Outpatient Medications on File Prior to Visit  Medication Sig Dispense Refill  . amLODipine-benazepril (LOTREL) 5-10 MG capsule TAKE 1 CAPSULE BY MOUTH EVERY DAY 90 capsule 2  . fluticasone (FLONASE) 50 MCG/ACT nasal spray Place 1 spray into both nostrils 2 (two) times daily. 16 g 2  . LORazepam (ATIVAN) 0.5 MG tablet Take 1 tablet (0.5 mg total) by mouth 2 (two) times daily as needed for anxiety. 60 tablet 2  . propranolol (INDERAL) 40 MG tablet Take 0.5 tablets (20 mg total) by mouth daily. 90 tablet 2   No current facility-administered medications on file prior to visit.     Past Medical History:  Diagnosis Date  . Anxiety   . Arthritis   . Depression   . Hypertension    No Known Allergies  Social History   Socioeconomic History  . Marital status: Married    Spouse name: Not on file  . Number of children: 0  .  Years of education: Not on file  . Highest education level: Not on file  Occupational History  . Not on file  Tobacco Use  . Smoking status: Never Smoker  . Smokeless tobacco: Never Used  Vaping Use  . Vaping Use: Never used  Substance and Sexual Activity  . Alcohol use: Yes    Comment: occasionally  . Drug use: Yes    Types: Marijuana  . Sexual activity: Yes    Birth control/protection: None  Other Topics Concern  . Not on file  Social History Narrative  . Not on file    Social Determinants of Health   Financial Resource Strain: Low Risk   . Difficulty of Paying Living Expenses: Not very hard  Food Insecurity: No Food Insecurity  . Worried About Charity fundraiser in the Last Year: Never true  . Ran Out of Food in the Last Year: Never true  Transportation Needs: No Transportation Needs  . Lack of Transportation (Medical): No  . Lack of Transportation (Non-Medical): No  Physical Activity:   . Days of Exercise per Week: Not on file  . Minutes of Exercise per Session: Not on file  Stress:   . Feeling of Stress : Not on file  Social Connections:   . Frequency of Communication with Friends and Family: Not on file  . Frequency of Social Gatherings with Friends and Family: Not on file  . Attends Religious Services: Not on file  . Active Member of Clubs or Organizations: Not on file  . Attends Archivist Meetings: Not on file  . Marital Status: Not on file    Vitals:   05/06/20 1453  BP: 136/80  Pulse: 98  Resp: 16  SpO2: 97%   Wt Readings from Last 3 Encounters:  05/06/20 223 lb (101.2 kg)  04/16/20 222 lb 14.4 oz (101.1 kg)  01/30/20 223 lb (101.2 kg)   Body mass index is 35.99 kg/m.  Physical Exam Vitals and nursing note reviewed.  Constitutional:      General: She is not in acute distress.    Appearance: She is well-developed.  HENT:     Head: Normocephalic and atraumatic.     Right Ear: Tympanic membrane, ear canal and external ear normal.     Left Ear: Tympanic membrane, ear canal and external ear normal.     Ears:     Comments: Dix-Hallpike maneuver negative.  She feels lightheaded when sitting on examination table but not worse with head movement.    Mouth/Throat:     Mouth: Mucous membranes are moist.     Pharynx: Oropharynx is clear.  Eyes:     Conjunctiva/sclera: Conjunctivae normal.     Pupils: Pupils are equal, round, and reactive to light.  Cardiovascular:     Rate and Rhythm: Normal rate and regular  rhythm.     Heart sounds: No murmur heard.   Pulmonary:     Effort: Pulmonary effort is normal. No respiratory distress.     Breath sounds: Normal breath sounds.  Abdominal:     Palpations: Abdomen is soft. There is no hepatomegaly or mass.     Tenderness: There is no abdominal tenderness.  Lymphadenopathy:     Cervical: No cervical adenopathy.  Skin:    General: Skin is warm.     Findings: No erythema or rash.  Neurological:     General: No focal deficit present.     Mental Status: She is alert and oriented to person, place, and  time.     Cranial Nerves: No cranial nerve deficit.     Motor: No weakness.     Coordination: Coordination is intact.     Gait: Gait normal.  Psychiatric:        Mood and Affect: Affect normal. Mood is anxious.     Comments: Well groomed, good eye contact.   ASSESSMENT AND PLAN:   [name] was seen today for 6 months follow-up.  Orders Placed This Encounter  Procedures  . Ambulatory referral to ENT   Lightheaded ? Vertigo. Continue Meclizine 25 mg tid prn, some side effects discussed. Fall prevention. Avoid activities that may increase risk of injury. Will arrange appt with ENT.  -     meclizine (ANTIVERT) 25 MG tablet; Take 1 tablet (25 mg total) by mouth 3 (three) times daily as needed for dizziness.  Hypertension, essential, benign BP otherwise adequately controlled. Continue Lotrel 5-10 mg daily. Monitor BP regularly. Low salt diet to continue.  Severe obesity (BMI 35.0-35.9 with comorbidity) ([place]) Wt has been stable. We discussed benefits of wt loss as well as adverse effects of obesity. Consistency with healthy diet and physical activity recommended.  Ductal carcinoma in situ (DCIS) of right breast Recommend discussing concerns about Tamoxifen with oncologist. Some side effects discussed as well as benefits of medication.   Return in about 6 months (around 11/03/2020) for HTN.   Jeslyn Amsler G. Martinique,  MD  [facility]. [place] office.

## 2020-05-07 ENCOUNTER — Telehealth: Payer: Self-pay

## 2020-05-07 ENCOUNTER — Telehealth: Payer: Self-pay | Admitting: Family Medicine

## 2020-05-07 NOTE — Telephone Encounter (Signed)
Do you need to update the referral?

## 2020-05-07 NOTE — Telephone Encounter (Signed)
I am not sure about indication for scan but I will need to re-evaluate. MRI was ordered by oncologist. In regard to radiation exposure, MRI is safer. Thanks, BJ

## 2020-05-07 NOTE — Telephone Encounter (Signed)
I spoke with patient. You had previously ordered a scan for her. Is it okay to reorder this so pt can have it done? She doesn't know if it is a good idea for her to have an MRI because of the radiation.

## 2020-05-07 NOTE — Telephone Encounter (Signed)
The patient did research on the ENT that Dr. Martinique referred her to and she did not like the reviews so she scheduled her own appointment at:  [facility] [address] [place]  Appointment October 5 at 10:40 AM  Just an [place]

## 2020-05-09 NOTE — Telephone Encounter (Signed)
You had ordered an MRI of the brain back at the end of 2019, should she go through with getting it now? Or wait to see ENT first?

## 2020-05-12 ENCOUNTER — Other Ambulatory Visit: Payer: Self-pay

## 2020-05-12 ENCOUNTER — Ambulatory Visit
Admission: RE | Admit: 2020-05-12 | Discharge: 2020-05-12 | Disposition: A | Discharge status: Home / Self Care | Payer: 59 | Source: Ambulatory Visit | Attending: Hematology | Admitting: Hematology

## 2020-05-12 ENCOUNTER — Other Ambulatory Visit: Payer: Self-pay | Admitting: Hematology

## 2020-05-12 ENCOUNTER — Ambulatory Visit: Admission: RE | Admit: 2020-05-12 | Payer: 59 | Source: Ambulatory Visit

## 2020-05-12 DIAGNOSIS — D0511 Intraductal carcinoma in situ of right breast: Secondary | ICD-10-CM

## 2020-05-12 IMAGING — MR MR BREAST*L* W/O CM
2 series · 34 of 48 positions shown · non-contrast
Comparison: Previous exam(s).

CLINICAL DATA: Patient presents today for limited LEFT breast MRI
to evaluate clip placement after earlier stereotactic biopsy and to
ensure that the clip placed at stereotactic biopsy corresponds to a
mass within the upper LEFT breast identified on earlier breast MRI.
Patient has a known RIGHT breast cancer (high-grade DCIS)

LABS:  N/a
EXAM:
MR OF THE LEFT BREAST WITH AND WITHOUT CONTRAST
TECHNIQUE: Multiplanar multisequence MR images of the left breast were obtained
without IV contrast.

[Series 2: fl3d pre-cm no · axial · non-contrast · 1.2mm · 0.94mm/px · z∈[-69,+110]mm · 26 of 160 slices shown]
[im 1/160]
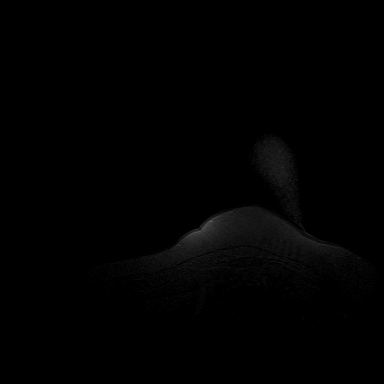
[im 5/160]
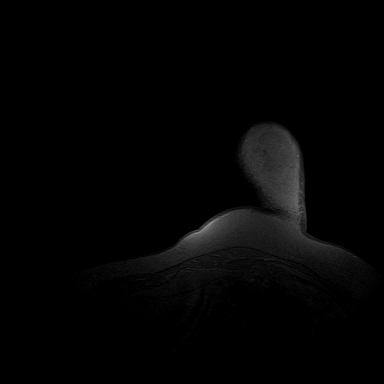
[im 10/160]
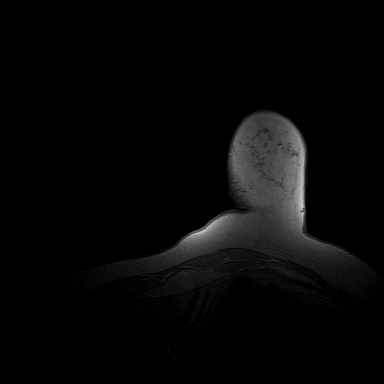
[im 15/160]
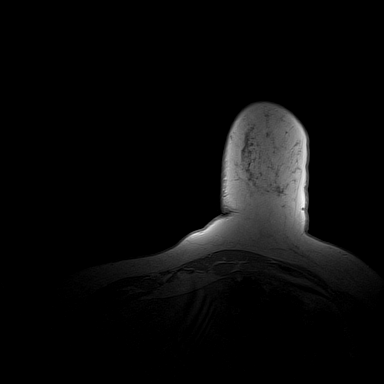
[im 20/160]
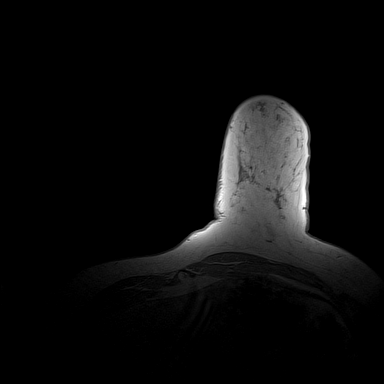
[im 25/160]
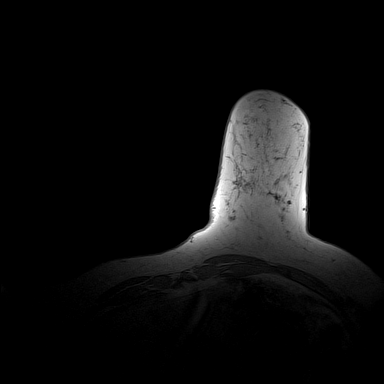
[im 29/160]
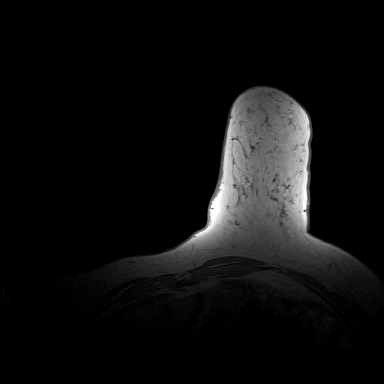
[im 34/160]
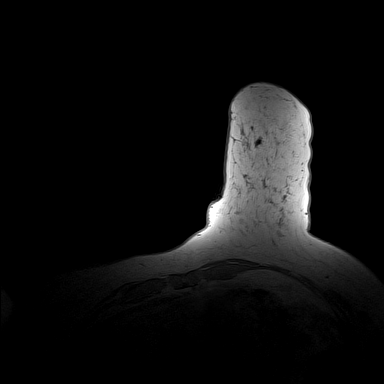
[im 39/160]
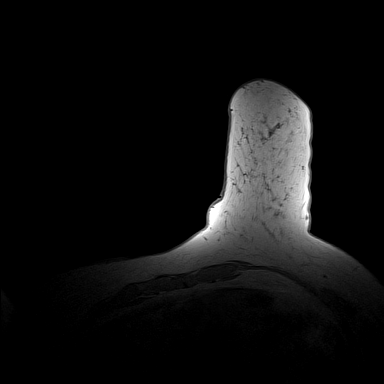
[im 44/160]
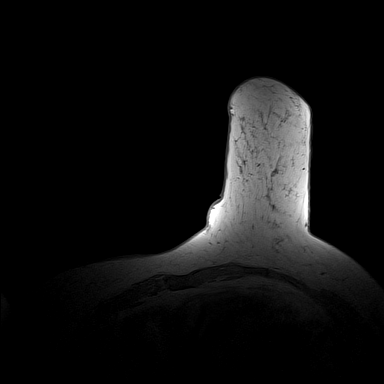
[im 49/160]
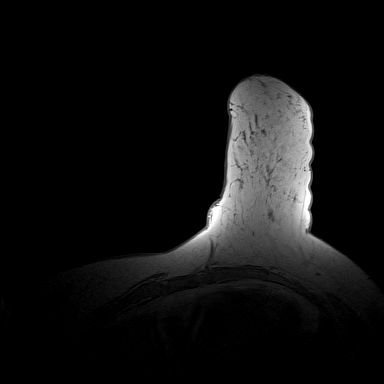
[im 54/160]
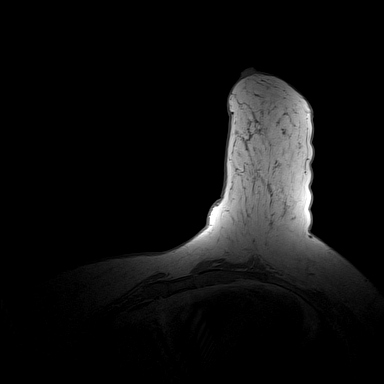
[im 58/160]
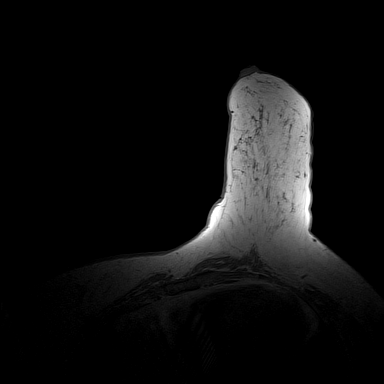
[im 63/160]
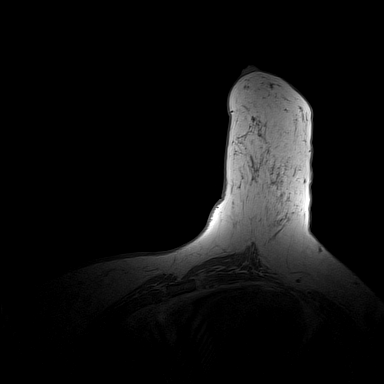
[im 68/160]
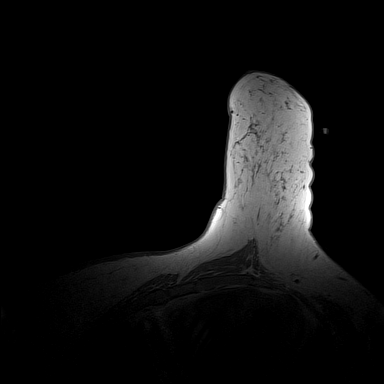
[im 73/160]
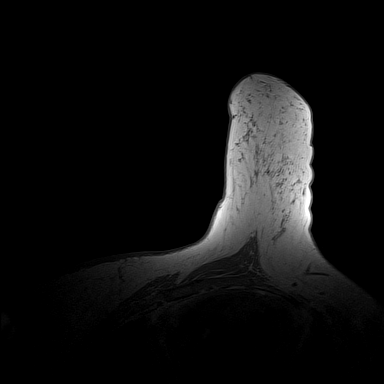
[im 78/160]
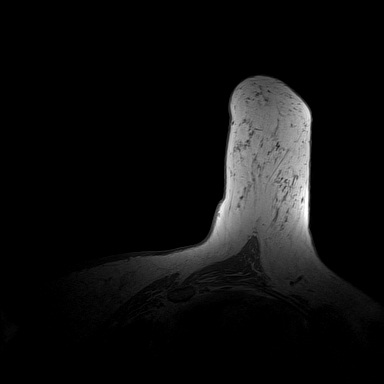
[im 82/160]
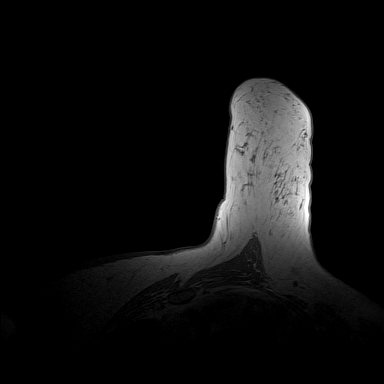
[im 87/160]
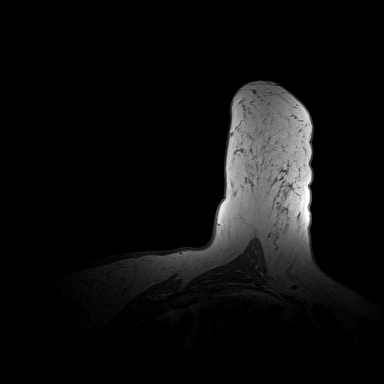
[im 92/160]
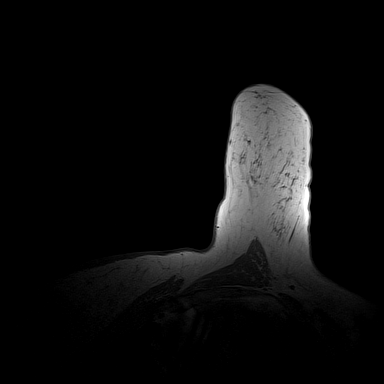
[im 97/160]
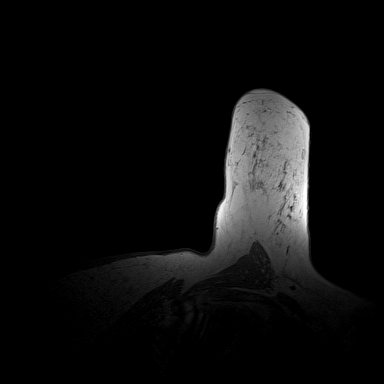
[im 102/160]
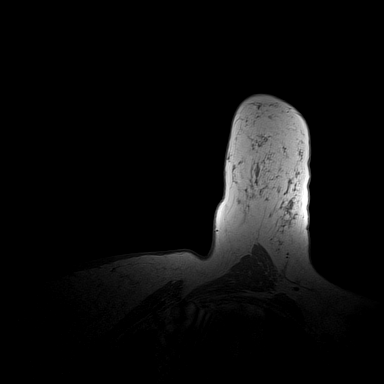
[im 111/160]
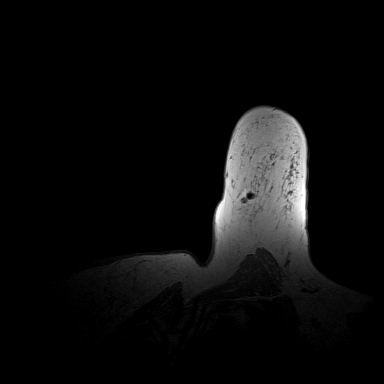
[im 131/160]
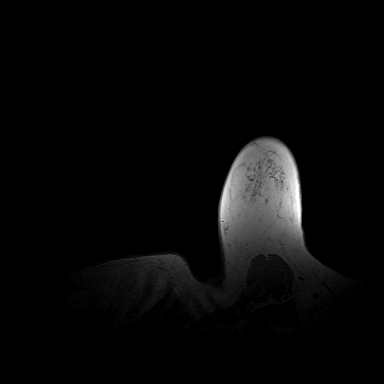
[im 135/160]
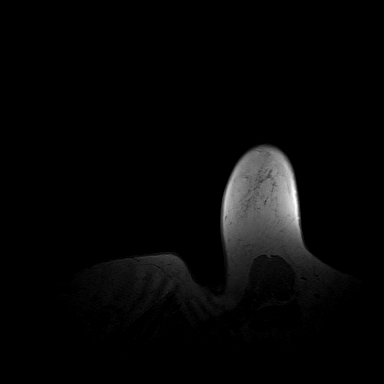
[im 150/160]
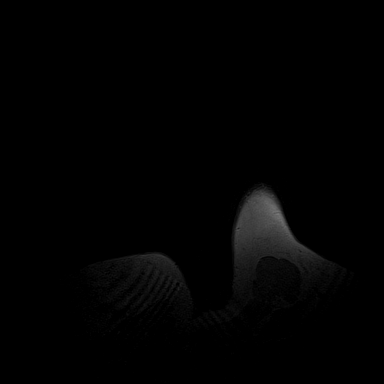

[Series 3: T2 fat-sat · axial · 3.0mm · 0.94mm/px · z∈[-71,+124]mm · 8 of 66 slices shown]
[im 1/66]
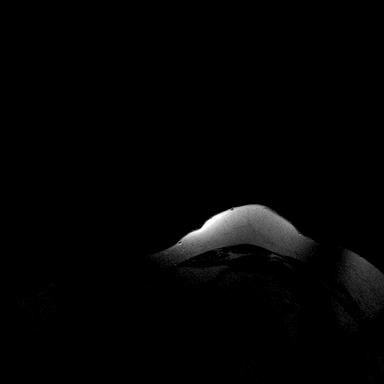
[im 11/66]
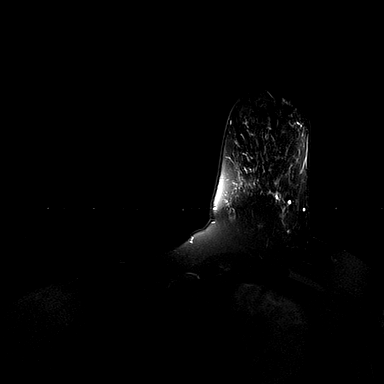
[im 21/66]
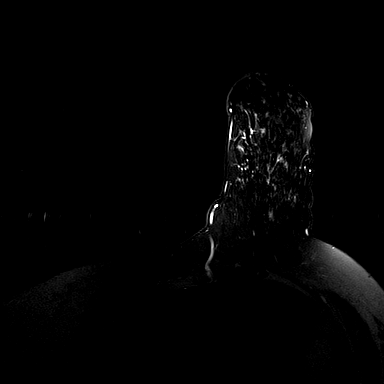
[im 31/66]
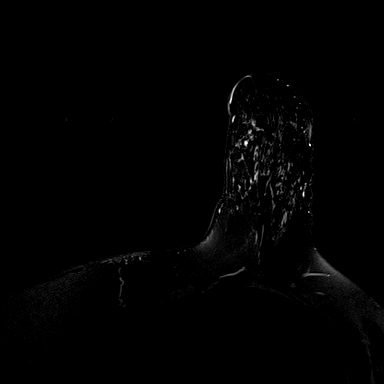
[im 36/66]
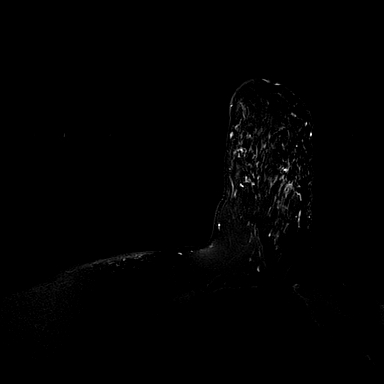
[im 46/66]
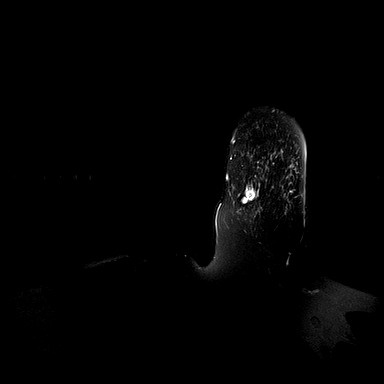
[im 56/66]
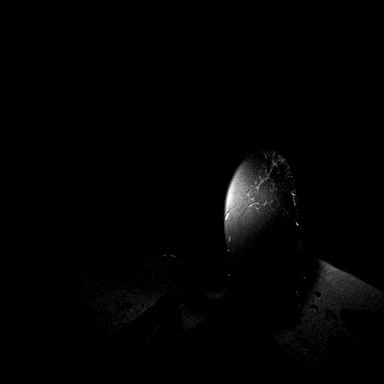
[im 66/66]
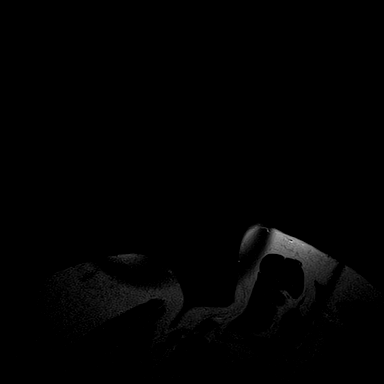

[34 of 48 positions shown; findings below may reference images not displayed]

Three-dimensional MR images were rendered by post-processing of the
original MR data on an independent workstation. The
three-dimensional MR images were interpreted, and findings are
reported in the following complete MRI report for this study. Three
dimensional images were evaluated at the independent [employer]
workstation
FINDINGS: Left breast: Limited sequences were obtained to evaluate clip
placement after earlier stereotactic biopsy. The clip corresponds to
the mass identified on earlier breast MRI. Stereotactic biopsy
revealed benign fibroadenoma.
IMPRESSION: 1. Biopsy clip placed at earlier stereotactic biopsy which revealed
benign fibroadenoma corresponds to the mass identified within the
upper LEFT breast on earlier MRI. As such, no additional MRI-guided
biopsy is necessary today.
2. Patient has a known RIGHT breast cancer.

RECOMMENDATION:
Per treatment plan for patient's known RIGHT breast cancer
(high-grade DCIS).

BI-RADS CATEGORY  6: Known biopsy-proven malignancy.

## 2020-05-13 ENCOUNTER — Encounter: Payer: Self-pay | Admitting: *Deleted

## 2020-05-13 ENCOUNTER — Other Ambulatory Visit: Payer: Self-pay | Admitting: Surgery

## 2020-05-13 DIAGNOSIS — D0511 Intraductal carcinoma in situ of right breast: Secondary | ICD-10-CM

## 2020-05-13 NOTE — Telephone Encounter (Signed)
I talked to pt. She went to see the ENT today. They gave her Topamax to try. They want her to try that and see how it works. Advised pt I will give pcp the notes from the visit once they come to [place].

## 2020-05-13 NOTE — Telephone Encounter (Signed)
This is a chronic problem, at some point hx was more suggestive of vertigo, so I recommended ENT evaluation. Provider will order labs/imaging if appropriate. We also discussed the possibility of neurology evaluation after she sees ENT.  If she prefers,she could go to neuro first and then ENT (if needed). Thanks, BJ

## 2020-05-14 ENCOUNTER — Other Ambulatory Visit: Payer: Self-pay | Admitting: Hematology

## 2020-05-20 ENCOUNTER — Other Ambulatory Visit: Payer: Self-pay | Admitting: Surgery

## 2020-05-20 DIAGNOSIS — D0511 Intraductal carcinoma in situ of right breast: Secondary | ICD-10-CM

## 2020-05-22 ENCOUNTER — Encounter: Payer: Self-pay | Admitting: *Deleted

## 2020-05-22 ENCOUNTER — Telehealth: Payer: Self-pay | Admitting: *Deleted

## 2020-05-22 DIAGNOSIS — D0511 Intraductal carcinoma in situ of right breast: Secondary | ICD-10-CM

## 2020-05-22 NOTE — Telephone Encounter (Signed)
LVM for call back to schedule appointment with Dr. Vincent Gros for FUN and CT Sim.

## 2020-05-29 ENCOUNTER — Encounter: Payer: Self-pay | Admitting: *Deleted

## 2020-05-30 ENCOUNTER — Telehealth: Payer: 59 | Admitting: Family Medicine

## 2020-05-30 ENCOUNTER — Encounter: Payer: Self-pay | Admitting: Family Medicine

## 2020-05-30 VITALS — Ht 66.0 in

## 2020-05-30 DIAGNOSIS — R42 Dizziness and giddiness: Secondary | ICD-10-CM | POA: Diagnosis not present

## 2020-05-30 DIAGNOSIS — F419 Anxiety disorder, unspecified: Secondary | ICD-10-CM

## 2020-05-30 DIAGNOSIS — R519 Headache, unspecified: Secondary | ICD-10-CM

## 2020-05-30 NOTE — Progress Notes (Signed)
Virtual Visit via Video Note   I connected with Sue Jackson on 05/30/20 by a video enabled telemedicine application and verified that I am speaking with the correct person using two identifiers.  Location patient: home Location provider:work office Persons participating in the virtual visit: patient, provider  I discussed the limitations of evaluation and management by telemedicine and the availability of in person appointments. The patient expressed understanding and agreed to proceed.  HPI: Sue Jackson is a 39 year old female with history of anxiety, hypertension, and depression concerned about possible brain lesion causing headache and lightheadedness. She is requesting brain imaging.  She is complaining about generalized headache yesterday. Reports no prior history of headaches. Headache started later during the day when she was at work. She had mild nausea when she was smelling food. No associated photophobia, phonophobia, vomiting, or focal weakness.  She also had neck pain, "a lot", no radiated. No history of trauma or unusual physical activity. 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 She has not taken OTC analgesic.  Recently evaluated by ENT because of recurrent lightheadedness. According to patient, she was diagnosed with vestibular migraine. Dietary recommendations were given and she was started on topiramate, now she is taking 50 mg daily. She is tolerating medication well and she feels like medication has helped with lightheadedness.  She feels like is"not right", "something is wrong." She is not sure if this could be related with anxiety or depression. Currently she is on lorazepam 0.5 mg twice daily as needed and propranolol 20 mg daily. She has not tolerated SSRIs or SNRIs in the past. She follows with psychiatrist regularly.  "Little" tingling of right hand also noted yesterday, no associated weakness or skin changes.  Negative  for fever, abnormal weight loss, sore throat, CP, dyspnea, abdominal pain, or urinary symptoms.  ROS: See pertinent positives and negatives per HPI.  Past Medical History:  Diagnosis Date  . Anxiety   . Arthritis   . Depression   . Hypertension    History reviewed. No pertinent surgical history.  Family History  Problem Relation Age of Onset  . Arthritis Mother   . Asthma Mother   . Cancer Maternal Aunt 59       breast  . Depression Maternal Aunt   . Hypertension Maternal Aunt   . Breast cancer Maternal Aunt   . Diabetes Maternal Grandfather   . Heart disease Maternal Grandfather   . Hypertension Maternal Grandfather   . Stroke Maternal Grandfather   . Cancer Maternal Aunt 39       breast cancer     Social History   Socioeconomic History  . Marital status: Married    Spouse name: Not on file  . Number of children: 0  . Years of education: Not on file  . Highest education level: Not on file  Occupational History  . Not on file  Tobacco Use  . Smoking status: Never Smoker  . Smokeless tobacco: Never Used  Vaping Use  . Vaping Use: Never used  Substance and Sexual Activity  . Alcohol use: Yes    Comment: occasionally  . Drug use: Yes    Types: Marijuana  . Sexual activity: Yes    Birth control/protection: None  Other Topics Concern  . Not on file  Social History Narrative  . Not on file   Social Determinants of Health   Financial Resource Strain: Low Risk   . Difficulty of Paying Living Expenses: Not very  hard  Food Insecurity: No Food Insecurity  . Worried About Charity fundraiser in the Last Year: Never true  . Ran Out of Food in the Last Year: Never true  Transportation Needs: No Transportation Needs  . Lack of Transportation (Medical): No  . Lack of Transportation (Non-Medical): No  Physical Activity:   . Days of Exercise per Week: Not on file  . Minutes of Exercise per Session: Not on file  Stress:   . Feeling of Stress : Not on file  Social  Connections:   . Frequency of Communication with Friends and Family: Not on file  . Frequency of Social Gatherings with Friends and Family: Not on file  . Attends Religious Services: Not on file  . Active Member of Clubs or Organizations: Not on file  . Attends Archivist Meetings: Not on file  . Marital Status: Not on file  Intimate Partner Violence:   . Fear of Current or Ex-Partner: Not on file  . Emotionally Abused: Not on file  . Physically Abused: Not on file  . Sexually Abused: Not on file    Current Outpatient Medications:  .  amLODipine-benazepril (LOTREL) 5-10 MG capsule, TAKE 1 CAPSULE BY MOUTH EVERY DAY (Patient taking differently: Take 1 capsule by mouth daily. ), Disp: 90 capsule, Rfl: 2 .  LORazepam (ATIVAN) 0.5 MG tablet, Take 1 tablet (0.5 mg total) by mouth 2 (two) times daily as needed for anxiety., Disp: 60 tablet, Rfl: 2 .  meclizine (ANTIVERT) 25 MG tablet, Take 1 tablet (25 mg total) by mouth 3 (three) times daily as needed for dizziness., Disp: 9045 tablet, Rfl: 1 .  propranolol (INDERAL) 40 MG tablet, Take 0.5 tablets (20 mg total) by mouth daily., Disp: 90 tablet, Rfl: 2 .  topiramate (TOPAMAX) 25 MG tablet, Take 50 mg by mouth at bedtime. , Disp: , Rfl:   EXAM:  VITALS per patient if applicable:Ht 5\' 6"  (1.676 m)   BMI 35.99 kg/m   GENERAL: alert, oriented, appears well and in no acute distress  HEENT: atraumatic, conjunctiva clear, no obvious abnormalities on inspection.  NECK: normal movements of the head and neck  LUNGS: on inspection no signs of respiratory distress, breathing rate appears normal, no obvious gross SOB, gasping or wheezing  CV: no obvious cyanosis  Sue: moves all visible extremities without noticeable abnormality  PSYCH/NEURO: pleasant and cooperative, no obvious depression, she is anxious. Speech and thought processing grossly intact. No focal deficit.  ASSESSMENT AND PLAN:  Discussed the following assessment and  plan:  Headache, unspecified headache type - Plan: MR Brain Wo Contrast One time episode of headache yesterday. Hx does not suggest a serious process. ? Tension like headache. She is very concerned and states that she is going to feel better if brain imaging is done to rule out brain lesions.  Lightheaded - Plan: MR Brain Wo Contrast Already evaluated by ENT, diagnosed with vestibular migraine, topiramate 50 mg daily has helped. Fall precautions discussed. She is following with ENT in 2 weeks.  Anxiety This problem could be a contributing factor for above problems. Continue lorazepam 0.5 mg twice daily as needed and propranolol 20 mg daily. Following with psychiatrist.  Hand tingling sensation could be caused bu carpal tunnel synd. Continue monitoring for changes.  I discussed the assessment and treatment plan with the patient. Sue Jackson was provided an opportunity to ask questions and all were answered. She agreed with the plan and demonstrated an understanding of the instructions.  Return if symptoms worsen or fail to improve, for Keep next f/u appt..    Willadeen Colantuono Martinique, MD

## 2020-06-04 NOTE — Pre-Procedure Instructions (Signed)
[id number] - [place], [place] [address] Phone: [phone] Fax: [phone]    Your procedure is scheduled on Thurs., Nov. 4, 2021 from 7:30AM-8:30AM  Report to [facility] Entrance "A" at 5:30AM  Call this number if you have problems the morning of surgery:  [phone]   Remember:  Do not eat after midnight on Nov. 3rd  You may drink clear liquids until 3 hours (4:30AM) prior to surgery time.  Clear liquids allowed are: Water, Juice (Non-Citric, no pulp), Black Coffee Only (no dairy or creamer), Clear Tea (no dairy or creamer), Carbonated Beverages, Gatorade, Plain Popsicles, Plain Jell-O.  Please complete your PRE-SURGERY ENSURE drink that was provided to you by (4:30AM) the morning of surgery.  Please, if able, drink it in one setting. DO NOT SIP.    Take these medicines the morning of surgery with A SIP OF WATER: Propranolol (INDERAL)       If Needed: LORazepam (ATIVAN) Meclizine (ANTIVERT)  As of today, STOP taking all Aspirin (unless instructed by your doctor) and Other Aspirin containing products, Vitamins, Fish oils, and Herbal medications. Also stop all NSAIDS i.e. Advil, Ibuprofen, Motrin, Aleve, Anaprox, Naproxen, BC, Goody Powders, and all Supplements.   No Smoking of any kind, Tobacco/Vaping, or Alcohol products 24 hours prior to your procedure. If you use a Cpap at night, you may bring all equipment for your overnight stay.   Special instructions:  [facility]- Preparing For Surgery  Before surgery, you can play an important role. Because skin is not sterile, your skin needs to be as free of germs as possible. You can reduce the number of germs on your skin by washing with CHG (chlorahexidine gluconate) Soap before surgery.  CHG is an antiseptic cleaner which kills germs and bonds with the skin to continue killing germs even after washing.    Please do not use if you have an allergy to CHG or  antibacterial soaps. If your skin becomes reddened/irritated stop using the CHG.  Do not shave (including legs and underarms) for at least 48 hours prior to first CHG shower. It is OK to shave your face.  Please follow these instructions carefully.   1. Shower the NIGHT BEFORE SURGERY and the MORNING OF SURGERY with CHG.   2. If you chose to wash your hair, wash your hair first as usual with your normal shampoo.  3. After you shampoo, rinse your hair and body thoroughly to remove the shampoo.  4. Use CHG as you would any other liquid soap. You can apply CHG directly to the skin and wash gently with a scrungie or a clean washcloth.   5. Apply the CHG Soap to your body ONLY FROM THE NECK DOWN.  Do not use on open wounds or open sores. Avoid contact with your eyes, ears, mouth and genitals (private parts). Wash Face and genitals (private parts)  with your normal soap.  6. Wash thoroughly, paying special attention to the area where your surgery will be performed.  7. Thoroughly rinse your body with warm water from the neck down.  8. DO NOT shower/wash with your normal soap after using and rinsing off the CHG Soap.  9. Pat yourself dry with a CLEAN TOWEL.  10. Wear CLEAN PAJAMAS to bed the night before surgery, wear comfortable clothes the morning of surgery  11. Place CLEAN SHEETS on your bed the night of your first shower and DO NOT SLEEP WITH PETS.  Day of Surgery:            Remember to brush your teeth WITH YOUR REGULAR TOOTHPASTE.  Do not wear jewelry, make-up or nail polish.  Do not wear lotions, powders, or perfumes, or deodorant.  Do not shave 48 hours prior to surgery.    Do not bring valuables to the hospital.  [facility] is not responsible for any belongings or valuables.  Contacts, dentures or bridgework may not be worn into surgery.    For patients admitted to the hospital, discharge time will be determined by your treatment team.  Patients discharged the day of  surgery will not be allowed to drive home, and someone age 83 and over needs to stay with them for 24 hours.  Please wear clean clothes to the hospital/surgery center.     Please read over the following fact sheets that you were given.

## 2020-06-05 ENCOUNTER — Encounter (HOSPITAL_COMMUNITY): Payer: Self-pay

## 2020-06-05 ENCOUNTER — Encounter (HOSPITAL_COMMUNITY)
Admission: RE | Admit: 2020-06-05 | Discharge: 2020-06-05 | Disposition: A | Discharge status: Home / Self Care | Payer: 59 | Source: Ambulatory Visit | Attending: Surgery | Admitting: Surgery

## 2020-06-05 ENCOUNTER — Other Ambulatory Visit: Payer: Self-pay

## 2020-06-05 DIAGNOSIS — Z01818 Encounter for other preprocedural examination: Secondary | ICD-10-CM | POA: Insufficient documentation

## 2020-06-05 HISTORY — DX: Anemia, unspecified: D64.9

## 2020-06-05 HISTORY — DX: Prediabetes: R73.03

## 2020-06-05 LAB — BASIC METABOLIC PANEL
Anion gap: 11 (ref 5–15)
BUN: 18 mg/dL (ref 6–20)
CO2: 19 mmol/L — ABNORMAL LOW (ref 22–32)
Calcium: 9.3 mg/dL (ref 8.9–10.3)
Chloride: 104 mmol/L (ref 98–111)
Creatinine, Ser: 0.77 mg/dL (ref 0.44–1.00)
GFR, Estimated: >60 mL/min (ref >60)
Glucose, Bld: 106 mg/dL — ABNORMAL HIGH (ref 70–99)
Potassium: 4.2 mmol/L (ref 3.5–5.1)
Sodium: 134 mmol/L — ABNORMAL LOW (ref 135–145)

## 2020-06-05 LAB — CBC
HCT: 45.1 % (ref 36.0–46.0)
Hemoglobin: 14.6 g/dL (ref 12.0–15.0)
MCH: 26.4 pg (ref 26.0–34.0)
MCHC: 32.4 g/dL (ref 30.0–36.0)
MCV: 81.6 fL (ref 80.0–100.0)
Platelets: 402 10*3/uL — ABNORMAL HIGH (ref 150–400)
RBC: 5.53 MIL/uL — ABNORMAL HIGH (ref 3.87–5.11)
RDW: 15.2 % (ref 11.5–15.5)
WBC: 11.1 10*3/uL — ABNORMAL HIGH (ref 4.0–10.5)
nRBC: 0 % (ref 0.0–0.2)

## 2020-06-05 LAB — POCT PREGNANCY, URINE: Preg Test, Ur: NEGATIVE

## 2020-06-05 LAB — GLUCOSE, CAPILLARY: Glucose-Capillary: 143 mg/dL — ABNORMAL HIGH (ref 70–99)

## 2020-06-05 NOTE — Progress Notes (Signed)
PCP - Dr. Betty Martinique Cardiologist - Denies  Chest x-ray -  EKG - 06-05-20 Stress Test - Denies ECHO - Denies Cardiac Cath - Denies  Sleep Study - Denies  DM - Prediabetic takes no medications. Checks blood occasionally.  CBG at appt 143  ERAS Protcol - Yes PRE-SURGERY Ensure given   COVID TEST- Nov 1st  Anesthesia review: No  Patient denies shortness of breath, fever, cough and chest pain at PAT appointment   All instructions explained to the patient, with a verbal understanding of the material. Patient agrees to go over the instructions while at home for a better understanding. Patient also instructed to self quarantine after being tested for COVID-19. The opportunity to ask questions was provided.

## 2020-06-09 ENCOUNTER — Other Ambulatory Visit (HOSPITAL_COMMUNITY)
Admission: RE | Admit: 2020-06-09 | Discharge: 2020-06-09 | Disposition: A | Discharge status: Home / Self Care | Payer: 59 | Source: Ambulatory Visit | Attending: Surgery | Admitting: Surgery

## 2020-06-09 DIAGNOSIS — Z20822 Contact with and (suspected) exposure to covid-19: Secondary | ICD-10-CM | POA: Insufficient documentation

## 2020-06-09 DIAGNOSIS — Z01812 Encounter for preprocedural laboratory examination: Secondary | ICD-10-CM | POA: Diagnosis present

## 2020-06-09 LAB — SARS CORONAVIRUS 2 (TAT 6-24 HRS): SARS Coronavirus 2: NEGATIVE

## 2020-06-11 ENCOUNTER — Ambulatory Visit
Admission: RE | Admit: 2020-06-11 | Discharge: 2020-06-11 | Disposition: A | Discharge status: Home / Self Care | Payer: 59 | Source: Ambulatory Visit | Attending: Surgery | Admitting: Surgery

## 2020-06-11 ENCOUNTER — Other Ambulatory Visit: Payer: Self-pay

## 2020-06-11 DIAGNOSIS — D0511 Intraductal carcinoma in situ of right breast: Secondary | ICD-10-CM

## 2020-06-11 IMAGING — MG MM PLC BREAST LOC DEV 1ST LESION INC*R*
6 series · 6 of 6 positions shown · non-contrast
Comparison: Previous exam(s).

CLINICAL DATA: Localization prior to surgery

EXAM:
MAMMOGRAPHIC GUIDED RADIOACTIVE SEED LOCALIZATION OF THE RIGHT
BREAST

[R ML (1 of 3)]
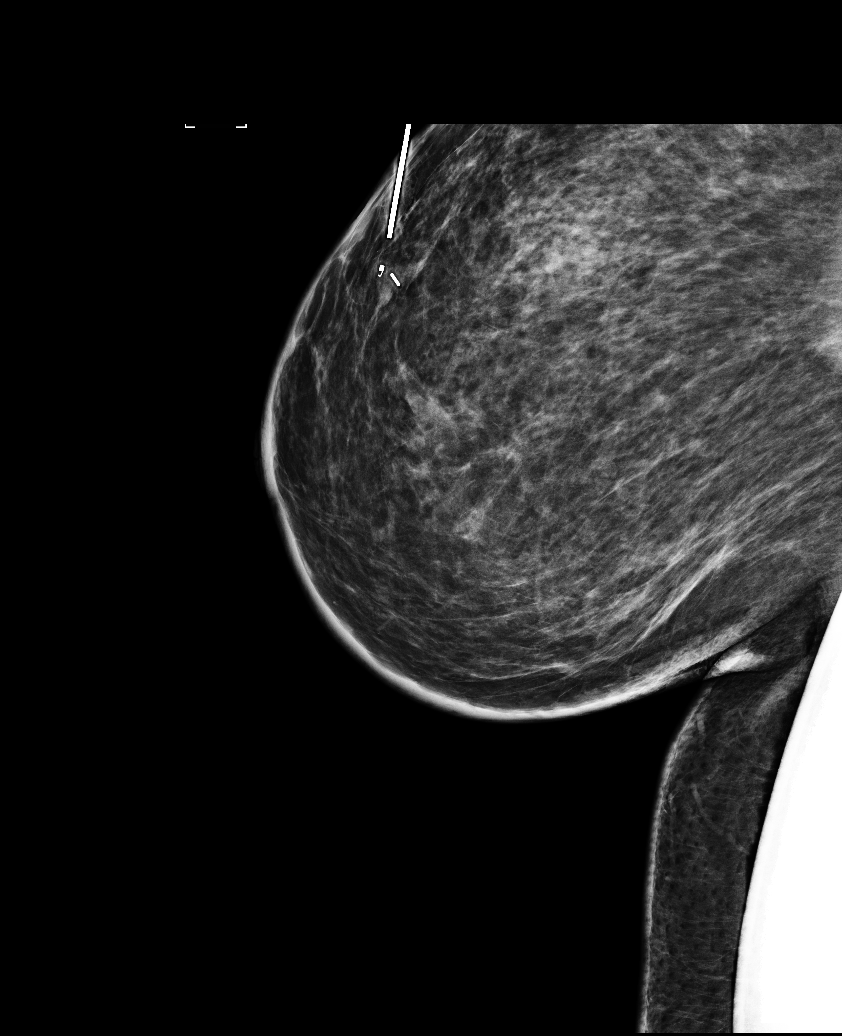

[R ML (2 of 3)]
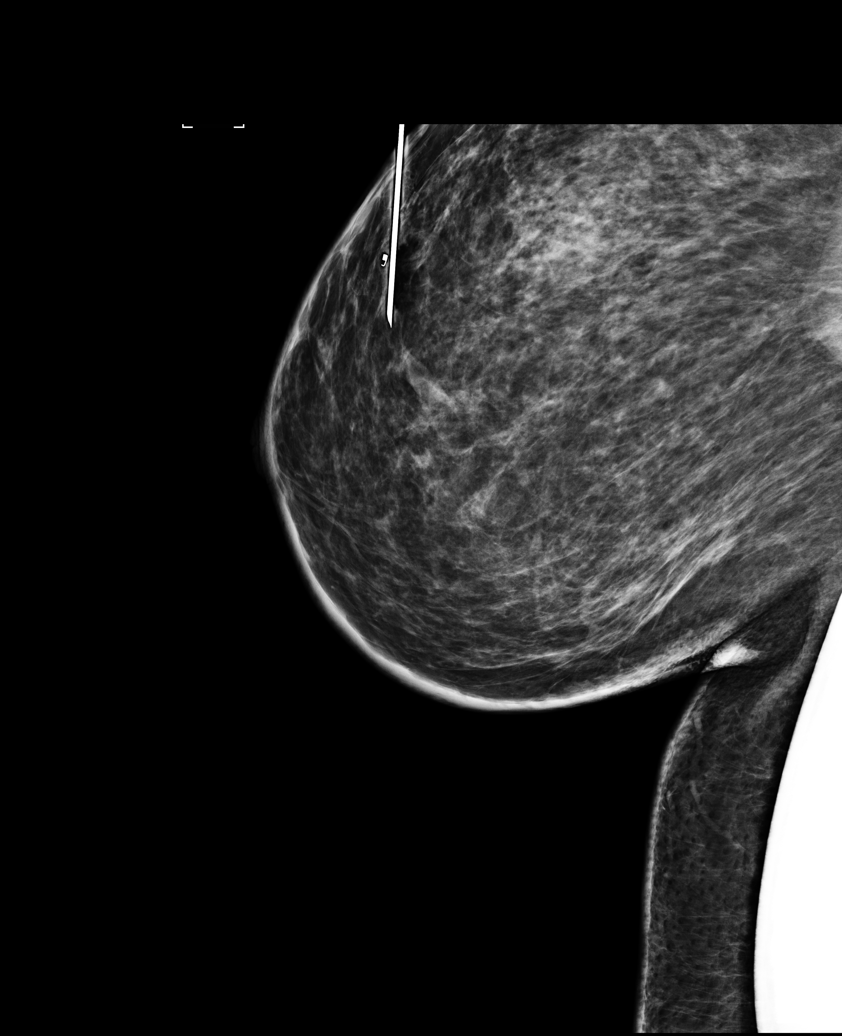

[R CC (1 of 3)]
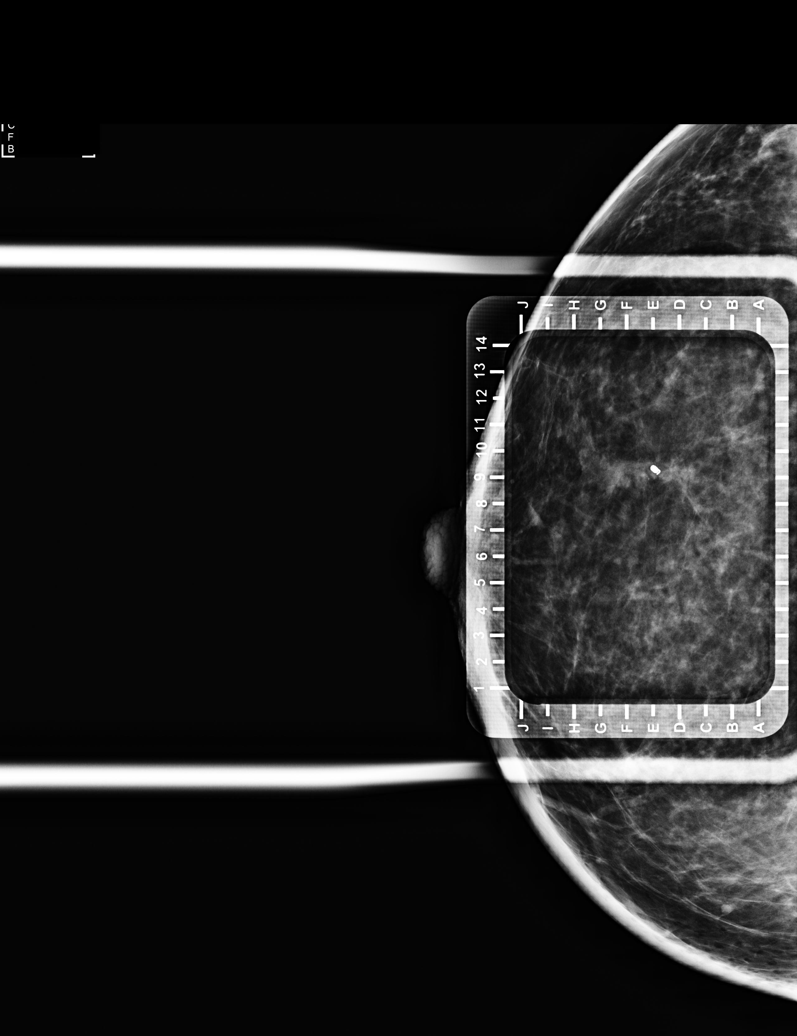

[R CC (2 of 3)]
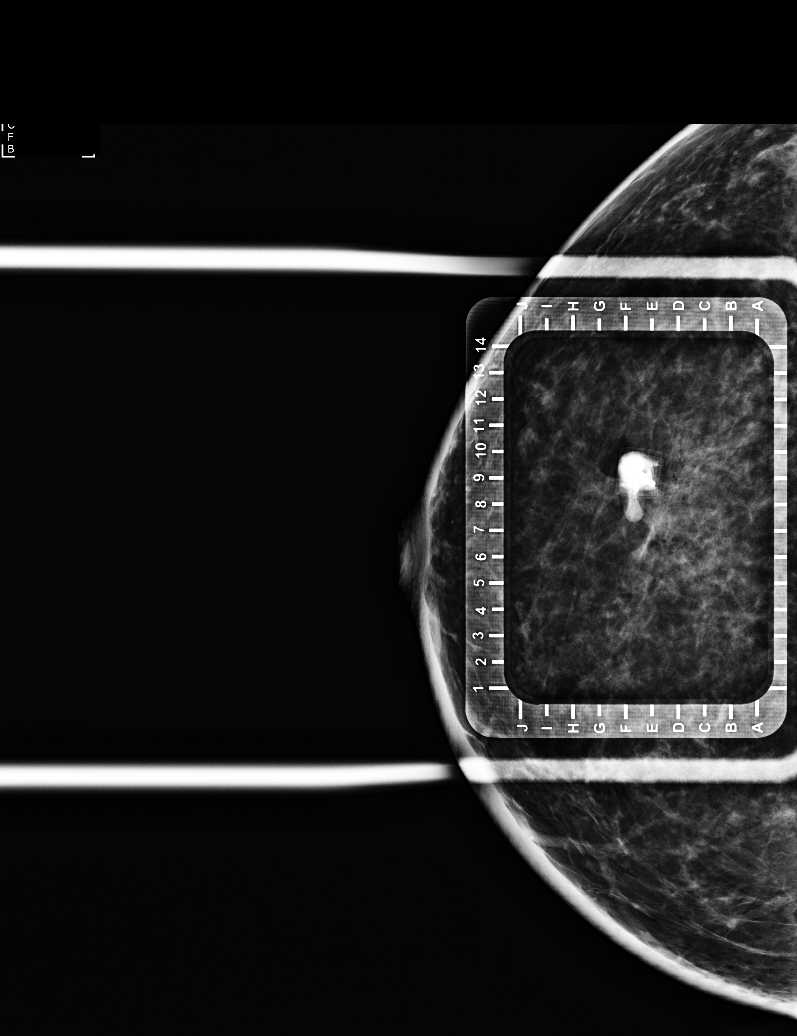

[R CC (3 of 3)]
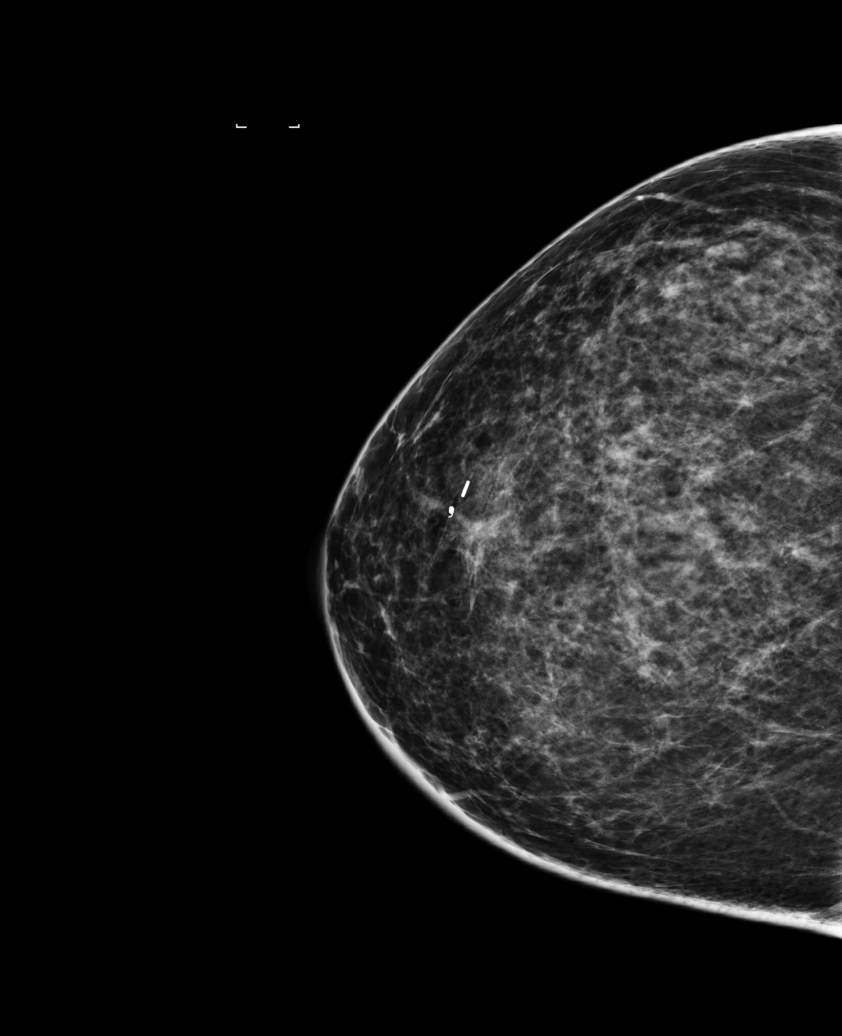

[R ML (3 of 3)]
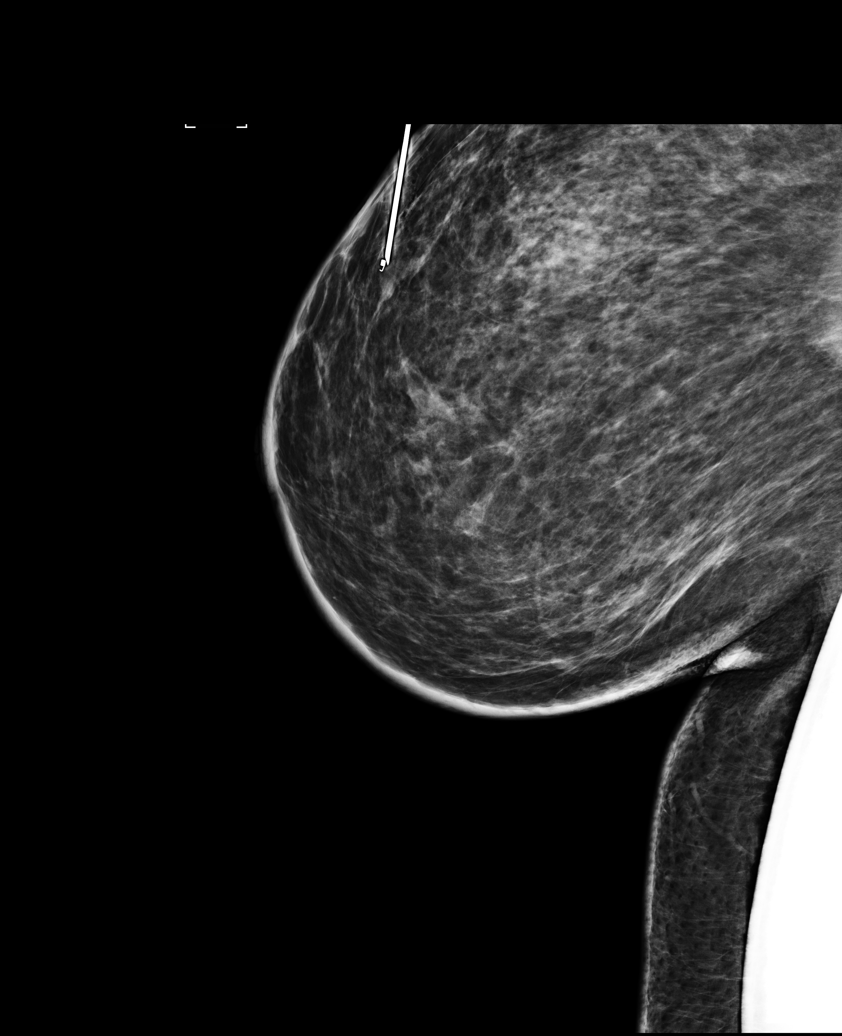

[6 of 6 positions shown; findings below may reference images not displayed]



The usual time-out protocol was performed immediately prior to the
procedure.

Using mammographic guidance, sterile technique, 1% lidocaine and an
[57] radioactive seed, the biopsy marker was localized using a
superior approach. The follow-up mammogram images confirm the seed
in the expected location and were marked for the surgeon.

Follow-up survey of the patient confirms presence of the radioactive
seed.

Order number of [57] seed:  [PHONE_NUMBER].

Total activity:  0.241 millicuries reference Date: [DATE]

The patient tolerated the procedure well and was released from the
[REDACTED]. She was given instructions regarding seed removal.
IMPRESSION: Radioactive seed localization right breast. No apparent
complications.

## 2020-06-11 NOTE — Anesthesia Preprocedure Evaluation (Addendum)
Anesthesia Evaluation  Patient identified by MRN, date of birth, ID band Patient awake    Reviewed: Allergy & Precautions, NPO status , Patient's Chart, lab work & pertinent test results  Airway Mallampati: II  TM Distance: >3 FB Neck ROM: Full    Dental  (+) Chipped,    Pulmonary neg pulmonary ROS,    Pulmonary exam normal breath sounds clear to auscultation       Cardiovascular hypertension, Pt. on medications and Pt. on home beta blockers Normal cardiovascular exam Rhythm:Regular Rate:Normal     Neuro/Psych  Headaches, PSYCHIATRIC DISORDERS Anxiety Depression Bipolar Disorder    GI/Hepatic negative GI ROS, Neg liver ROS,   Endo/Other  Obesity   Renal/GU negative Renal ROS     Musculoskeletal  (+) Arthritis ,   Abdominal   Peds  Hematology negative hematology ROS (+)   Anesthesia Other Findings   Reproductive/Obstetrics negative OB ROS                            Anesthesia Physical Anesthesia Plan  ASA: II  Anesthesia Plan: General   Post-op Pain Management:    Induction: Intravenous  PONV Risk Score and Plan: 3 and Midazolam, Dexamethasone and Ondansetron  Airway Management Planned: LMA  Additional Equipment:   Intra-op Plan:   Post-operative Plan: Extubation in OR  Informed Consent: I have reviewed the patients History and Physical, chart, labs and discussed the procedure including the risks, benefits and alternatives for the proposed anesthesia with the patient or authorized representative who has indicated his/her understanding and acceptance.       Plan Discussed with: CRNA  Anesthesia Plan Comments:        Anesthesia Quick Evaluation

## 2020-06-11 NOTE — H&P (Signed)
   Sue Jackson  Location: [facility] Patient #: [id number] DOB: 09-27-1980 Undefined / Language: Cleophus Molt / Race: Undefined Female   History of Present Illness  The patient is a 39 year old female who presents with breast cancer. Chief complaint: DCIS right breast  This is a 39 year old female who was found to have a 9 mm group of calcifications in the right breast on recent screening mammography. 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 There were 2 benign-appearing areas in the left breast. She is now scheduled have these biopsied given the diagnosis on the right breast. She has a strong family history of breast cancer in 2 maternal aunts. She has had no previous problems with her breast. She denies nipple discharge. She is otherwise healthy and without complaints. She has no previous surgical history.   Medication History Medications Reconciled    Physical Exam The physical exam findings are as follows: Note: Chief complaint: DCIS right breast  This is a 39 year old female who was found to have a 9 mm group of calcifications in the right breast on recent screening mammography. 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 There were 2 benign-appearing areas in the left breast. She is now scheduled have these biopsied given the diagnosis on the right breast. She has a strong family history of breast cancer in 2 maternal aunts. She has had no previous problems with her breast. She denies nipple discharge. She is otherwise healthy and without complaints. She has no previous surgical history.    Assessment & Plan  BREAST NEOPLASM, TIS (DCIS), RIGHT (D05.11)  Impression: I have reviewed her notes in the electronic medical records. I have reviewed her mammograms, ultrasound, and pathology results. We also discussed her in detail and  her multidisciplinary breast cancer conference. 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 I discussed the diagnosis with the patient and her partner. We then discussed lumpectomy versus breast conservation surgery. It is recommended she undergo a radioactive seed guided right breast lumpectomy. She agrees and would like to proceed with breast conservation. Given her family history of breast cancer, the benign-appearing masses in her left breast, and her density, preoperative MRI is strongly recommended. She is already going to be having biopsy of the masses in the left breast. We will hold on scheduling surgery until the results of the MRI and the biopsies are known. I did discuss the surgical procedure with her in detail. I discussed the risk which includes but is not limited to bleeding, infection, injury to surrounding structures, the need for further surgery if margins are positive, cardiopulmonary issues, postoperative recovery, etc. She understands and agrees to proceed with surgery as eventually scheduled.

## 2020-06-12 ENCOUNTER — Ambulatory Visit (HOSPITAL_COMMUNITY)
Admission: RE | Admit: 2020-06-12 | Discharge: 2020-06-12 | Disposition: A | Discharge status: Home / Self Care | Payer: 59 | Attending: Surgery | Admitting: Surgery

## 2020-06-12 ENCOUNTER — Ambulatory Visit
Admission: RE | Admit: 2020-06-12 | Discharge: 2020-06-12 | Disposition: A | Discharge status: Home / Self Care | Payer: 59 | Source: Ambulatory Visit | Attending: Surgery | Admitting: Surgery

## 2020-06-12 ENCOUNTER — Ambulatory Visit (HOSPITAL_COMMUNITY): Payer: 59 | Admitting: Anesthesiology

## 2020-06-12 ENCOUNTER — Other Ambulatory Visit: Payer: Self-pay

## 2020-06-12 ENCOUNTER — Encounter (HOSPITAL_COMMUNITY): Payer: Self-pay | Admitting: Surgery

## 2020-06-12 ENCOUNTER — Encounter (HOSPITAL_COMMUNITY)
Admission: RE | Disposition: A | Discharge status: Home / Self Care | Payer: Self-pay | Source: Home / Self Care | Attending: Surgery

## 2020-06-12 DIAGNOSIS — Z170 Estrogen receptor positive status [ER+]: Secondary | ICD-10-CM | POA: Diagnosis not present

## 2020-06-12 DIAGNOSIS — D0511 Intraductal carcinoma in situ of right breast: Secondary | ICD-10-CM

## 2020-06-12 DIAGNOSIS — Z803 Family history of malignant neoplasm of breast: Secondary | ICD-10-CM | POA: Insufficient documentation

## 2020-06-12 HISTORY — PX: BREAST LUMPECTOMY: SHX2

## 2020-06-12 HISTORY — PX: BREAST LUMPECTOMY WITH RADIOACTIVE SEED LOCALIZATION: SHX6424

## 2020-06-12 LAB — GLUCOSE, CAPILLARY: Glucose-Capillary: 120 mg/dL — ABNORMAL HIGH (ref 70–99)

## 2020-06-12 IMAGING — MG MM BREAST SURGICAL SPECIMEN
1 series · 1 of 1 positions shown · non-contrast
Comparison: Previous exam(s).

CLINICAL DATA: Specimen radiograph status post right breast
lumpectomy.

EXAM:
SPECIMEN RADIOGRAPH OF THE RIGHT BREAST

[R]
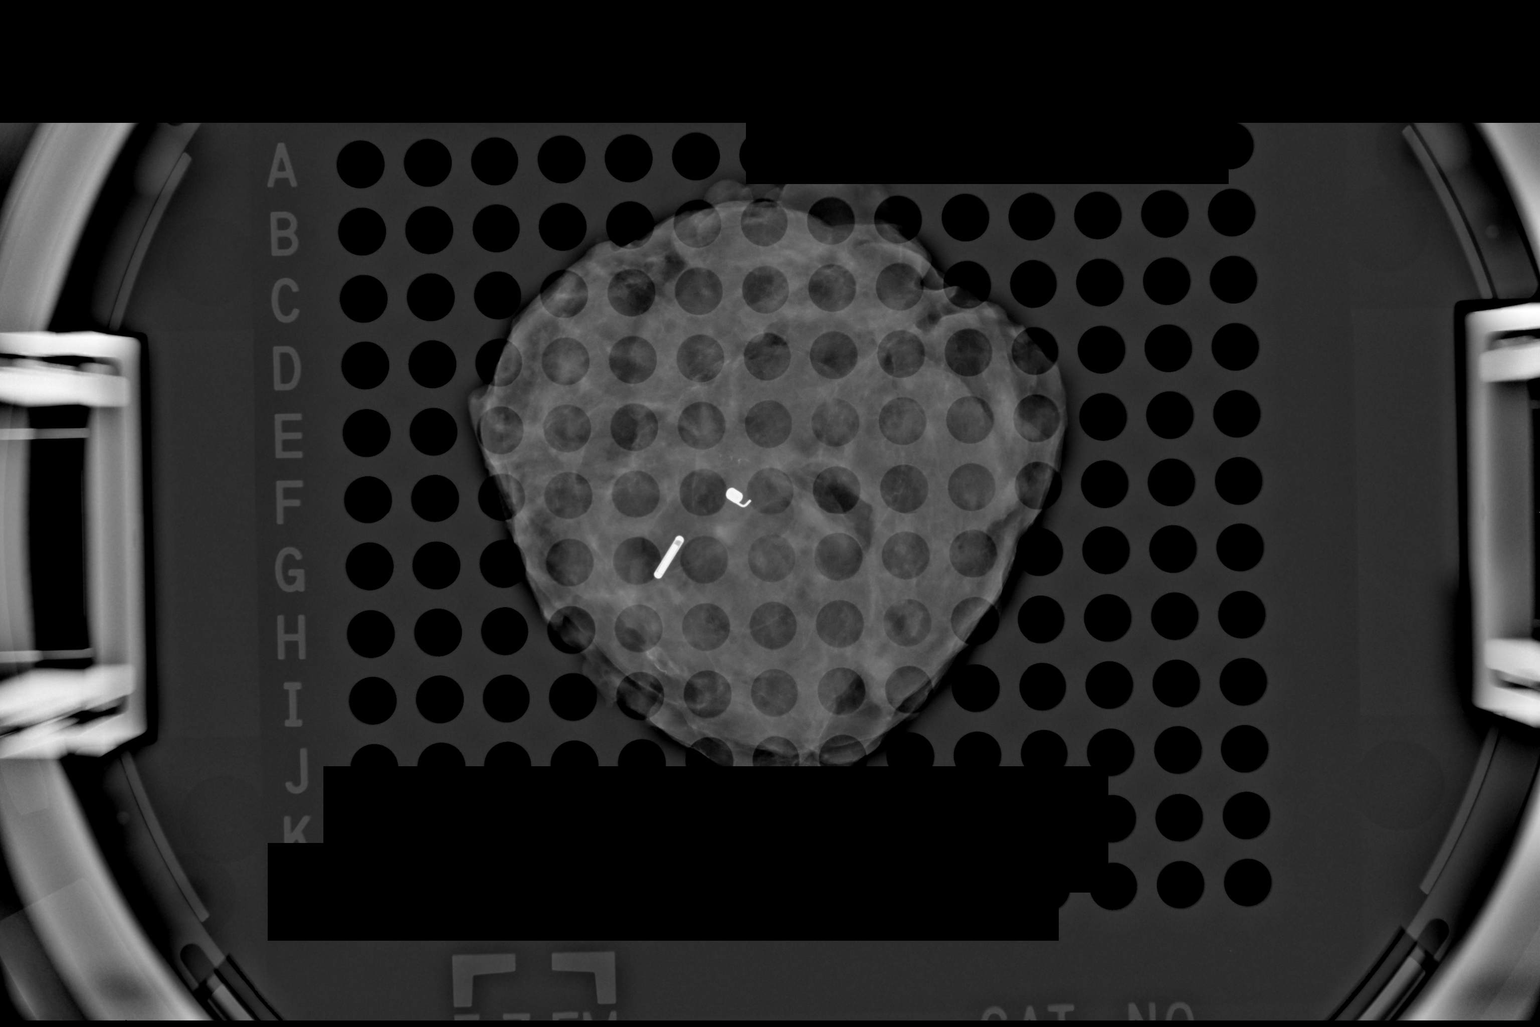

[1 of 1 positions shown; findings below may reference images not displayed]

FINDINGS: Status post excision of the right breast. The radioactive seed and
biopsy marker clip are present, completely intact, and were marked
for pathology. These findings were communicated with the OR at [DATE]
a.m.
IMPRESSION: Specimen radiograph of the right breast.

## 2020-06-12 SURGERY — BREAST LUMPECTOMY WITH RADIOACTIVE SEED LOCALIZATION
Anesthesia: General | Site: Breast | Laterality: Right

## 2020-06-12 MED ORDER — ONDANSETRON HCL 4 MG/2ML IJ SOLN
INTRAMUSCULAR | Status: DC | PRN
  Administered 2020-06-12: 4 mg via INTRAVENOUS

## 2020-06-12 MED ORDER — FENTANYL CITRATE (PF) 100 MCG/2ML IJ SOLN
INTRAMUSCULAR | Status: DC | PRN
  Administered 2020-06-12: 50 ug via INTRAVENOUS
  Administered 2020-06-12: 75 ug via INTRAVENOUS

## 2020-06-12 MED ORDER — CHLORHEXIDINE GLUCONATE 0.12 % MT SOLN
15.0000 mL | Freq: Once | OROMUCOSAL | Status: AC
  Administered 2020-06-12: 15 mL via OROMUCOSAL
  Filled 2020-06-12: qty 15

## 2020-06-12 MED ORDER — ONDANSETRON HCL 4 MG/2ML IJ SOLN
INTRAMUSCULAR | Status: AC
  Filled 2020-06-12: qty 2

## 2020-06-12 MED ORDER — DEXAMETHASONE SODIUM PHOSPHATE 10 MG/ML IJ SOLN
INTRAMUSCULAR | Status: AC
  Filled 2020-06-12: qty 1

## 2020-06-12 MED ORDER — CELECOXIB 200 MG PO CAPS
400.0000 mg | ORAL_CAPSULE | ORAL | Status: AC
  Administered 2020-06-12: 400 mg via ORAL
  Filled 2020-06-12: qty 2

## 2020-06-12 MED ORDER — PROPOFOL 10 MG/ML IV BOLUS
INTRAVENOUS | Status: AC
  Filled 2020-06-12: qty 40

## 2020-06-12 MED ORDER — BUPIVACAINE HCL (PF) 0.25 % IJ SOLN
INTRAMUSCULAR | Status: AC
  Filled 2020-06-12: qty 30

## 2020-06-12 MED ORDER — BUPIVACAINE HCL (PF) 0.25 % IJ SOLN
INTRAMUSCULAR | Status: DC | PRN
  Administered 2020-06-12: 16 mL

## 2020-06-12 MED ORDER — MIDAZOLAM HCL 2 MG/2ML IJ SOLN
INTRAMUSCULAR | Status: DC | PRN
  Administered 2020-06-12: 2 mg via INTRAVENOUS

## 2020-06-12 MED ORDER — DEXAMETHASONE SODIUM PHOSPHATE 10 MG/ML IJ SOLN
INTRAMUSCULAR | Status: DC | PRN
  Administered 2020-06-12: 8 mg via INTRAVENOUS

## 2020-06-12 MED ORDER — ENSURE PRE-SURGERY PO LIQD: 296.0000 mL | Freq: Once | ORAL | Status: DC

## 2020-06-12 MED ORDER — OXYCODONE HCL 5 MG PO TABS
5.0000 mg | ORAL_TABLET | Freq: Four times a day (QID) | ORAL | 0 refills | Status: DC | PRN
Start: 2020-06-12 — End: 2020-09-04

## 2020-06-12 MED ORDER — PROPRANOLOL HCL 20 MG PO TABS
20.0000 mg | ORAL_TABLET | ORAL | Status: AC
  Administered 2020-06-12: 20 mg via ORAL
  Filled 2020-06-12: qty 1

## 2020-06-12 MED ORDER — FENTANYL CITRATE (PF) 250 MCG/5ML IJ SOLN
INTRAMUSCULAR | Status: AC
  Filled 2020-06-12: qty 5

## 2020-06-12 MED ORDER — ORAL CARE MOUTH RINSE: 15.0000 mL | Freq: Once | OROMUCOSAL | Status: AC

## 2020-06-12 MED ORDER — LIDOCAINE 2% (20 MG/ML) 5 ML SYRINGE
INTRAMUSCULAR | Status: DC | PRN
  Administered 2020-06-12: 80 mg via INTRAVENOUS

## 2020-06-12 MED ORDER — MIDAZOLAM HCL 2 MG/2ML IJ SOLN
INTRAMUSCULAR | Status: AC
  Filled 2020-06-12: qty 2

## 2020-06-12 MED ORDER — PROPOFOL 10 MG/ML IV BOLUS
INTRAVENOUS | Status: DC | PRN
  Administered 2020-06-12: 170 mg via INTRAVENOUS

## 2020-06-12 MED ORDER — CHLORHEXIDINE GLUCONATE CLOTH 2 % EX PADS: 6.0000 | MEDICATED_PAD | Freq: Once | CUTANEOUS | Status: DC

## 2020-06-12 MED ORDER — LIDOCAINE 2% (20 MG/ML) 5 ML SYRINGE
INTRAMUSCULAR | Status: AC
  Filled 2020-06-12: qty 5

## 2020-06-12 MED ORDER — LACTATED RINGERS IV SOLN: INTRAVENOUS | Status: DC

## 2020-06-12 MED ORDER — ACETAMINOPHEN 500 MG PO TABS
1000.0000 mg | ORAL_TABLET | ORAL | Status: AC
  Administered 2020-06-12: 1000 mg via ORAL
  Filled 2020-06-12: qty 2

## 2020-06-12 MED ORDER — EPHEDRINE SULFATE-NACL 50-0.9 MG/10ML-% IV SOSY
PREFILLED_SYRINGE | INTRAVENOUS | Status: DC | PRN
  Administered 2020-06-12: 5 mg via INTRAVENOUS

## 2020-06-12 MED ORDER — ROCURONIUM BROMIDE 10 MG/ML (PF) SYRINGE
PREFILLED_SYRINGE | INTRAVENOUS | Status: AC
  Filled 2020-06-12: qty 10

## 2020-06-12 MED ORDER — CEFAZOLIN SODIUM-DEXTROSE 2-4 GM/100ML-% IV SOLN
2.0000 g | INTRAVENOUS | Status: AC
  Administered 2020-06-12: 2 g via INTRAVENOUS
  Filled 2020-06-12: qty 100

## 2020-06-12 MED ORDER — 0.9 % SODIUM CHLORIDE (POUR BTL) OPTIME
TOPICAL | Status: DC | PRN
  Administered 2020-06-12: 1000 mL

## 2020-06-12 MED ORDER — FENTANYL CITRATE (PF) 100 MCG/2ML IJ SOLN: 25.0000 ug | INTRAMUSCULAR | Status: DC | PRN

## 2020-06-12 MED ORDER — PROMETHAZINE HCL 25 MG/ML IJ SOLN: 6.2500 mg | INTRAMUSCULAR | Status: DC | PRN

## 2020-06-12 MED ORDER — SUCCINYLCHOLINE CHLORIDE 200 MG/10ML IV SOSY
PREFILLED_SYRINGE | INTRAVENOUS | Status: AC
  Filled 2020-06-12: qty 10

## 2020-06-12 MED ORDER — GABAPENTIN 300 MG PO CAPS
300.0000 mg | ORAL_CAPSULE | ORAL | Status: AC
  Administered 2020-06-12: 300 mg via ORAL
  Filled 2020-06-12: qty 1

## 2020-06-12 SURGICAL SUPPLY — 31 items
APPLIER CLIP 9.375 MED OPEN (MISCELLANEOUS) ×3
BINDER BREAST LRG (GAUZE/BANDAGES/DRESSINGS) IMPLANT
BINDER BREAST XLRG (GAUZE/BANDAGES/DRESSINGS) IMPLANT
CANISTER SUCT 3000ML PPV (MISCELLANEOUS) ×3 IMPLANT
CHLORAPREP W/TINT 26 (MISCELLANEOUS) ×3 IMPLANT
CLIP APPLIE 9.375 MED OPEN (MISCELLANEOUS) ×1 IMPLANT
COVER PROBE W GEL 5X96 (DRAPES) ×3 IMPLANT
COVER SURGICAL LIGHT HANDLE (MISCELLANEOUS) ×3 IMPLANT
COVER WAND RF STERILE (DRAPES) ×3 IMPLANT
DERMABOND ADVANCED (GAUZE/BANDAGES/DRESSINGS) ×2
DERMABOND ADVANCED .7 DNX12 (GAUZE/BANDAGES/DRESSINGS) ×1 IMPLANT
DEVICE DUBIN SPECIMEN MAMMOGRA (MISCELLANEOUS) ×3 IMPLANT
DRAPE CHEST BREAST 15X10 FENES (DRAPES) ×3 IMPLANT
ELECT CAUTERY BLADE 6.4 (BLADE) ×3 IMPLANT
ELECT REM PT RETURN 9FT ADLT (ELECTROSURGICAL) ×3
ELECTRODE REM PT RTRN 9FT ADLT (ELECTROSURGICAL) ×1 IMPLANT
GLOVE SURG SIGNA 7.5 PF LTX (GLOVE) ×3 IMPLANT
GOWN STRL REUS W/ TWL LRG LVL3 (GOWN DISPOSABLE) ×1 IMPLANT
GOWN STRL REUS W/ TWL XL LVL3 (GOWN DISPOSABLE) ×1 IMPLANT
GOWN STRL REUS W/TWL LRG LVL3 (GOWN DISPOSABLE) ×2
GOWN STRL REUS W/TWL XL LVL3 (GOWN DISPOSABLE) ×2
KIT BASIN OR (CUSTOM PROCEDURE TRAY) ×3 IMPLANT
KIT MARKER MARGIN INK (KITS) ×3 IMPLANT
NEEDLE HYPO 25GX1X1/2 BEV (NEEDLE) ×3 IMPLANT
NS IRRIG 1000ML POUR BTL (IV SOLUTION) IMPLANT
PACK GENERAL/GYN (CUSTOM PROCEDURE TRAY) ×3 IMPLANT
SUT MNCRL AB 4-0 PS2 18 (SUTURE) ×3 IMPLANT
SUT VIC AB 3-0 SH 18 (SUTURE) ×3 IMPLANT
SYR CONTROL 10ML LL (SYRINGE) ×3 IMPLANT
TOWEL GREEN STERILE (TOWEL DISPOSABLE) ×3 IMPLANT
TOWEL GREEN STERILE FF (TOWEL DISPOSABLE) ×3 IMPLANT

## 2020-06-12 NOTE — Discharge Instructions (Signed)
[facility] Office Phone Number [phone]  BREAST BIOPSY/ PARTIAL MASTECTOMY: POST OP INSTRUCTIONS  Always review your discharge instruction sheet given to you by the facility where your surgery was performed.  IF YOU HAVE DISABILITY OR FAMILY LEAVE FORMS, YOU MUST BRING THEM TO THE OFFICE FOR PROCESSING.  DO NOT GIVE THEM TO YOUR DOCTOR.  1. A prescription for pain medication may be given to you upon discharge.  Take your pain medication as prescribed, if needed.  If narcotic pain medicine is not needed, then you may take acetaminophen (Tylenol) or ibuprofen (Advil) as needed. 2. Take your usually prescribed medications unless otherwise directed 3. If you need a refill on your pain medication, please contact your pharmacy.  They will contact our office to request authorization.  Prescriptions will not be filled after 5pm or on week-ends. 4. You should eat very light the first 24 hours after surgery, such as soup, crackers, pudding, etc.  Resume your normal diet the day after surgery. 5. Most patients will experience some swelling and bruising in the breast.  Ice packs and a good support bra will help.  Swelling and bruising can take several days to resolve.  6. It is common to experience some constipation if taking pain medication after surgery.  Increasing fluid intake and taking a stool softener will usually help or prevent this problem from occurring.  A mild laxative (Milk of Magnesia or Miralax) should be taken according to package directions if there are no bowel movements after 48 hours. 7. Unless discharge instructions indicate otherwise, you may remove your bandages 24-48 hours after surgery, and you may shower at that time.  You may have steri-strips (small skin tapes) in place directly over the incision.  These strips should be left on the skin for 7-10 days.  If your surgeon used skin glue on the incision, you may shower in 24 hours.  The glue will flake off over the  next 2-3 weeks.  Any sutures or staples will be removed at the office during your follow-up visit. 8. ACTIVITIES:  You may resume regular daily activities (gradually increasing) beginning the next day.  Wearing a good support bra or sports bra minimizes pain and swelling.  You may have sexual intercourse when it is comfortable. a. You may drive when you no longer are taking prescription pain medication, you can comfortably wear a seatbelt, and you can safely maneuver your car and apply brakes. b. RETURN TO WORK:  ______________________________________________________________________________________ 9. You should see your doctor in the office for a follow-up appointment approximately two weeks after your surgery.  Your doctor's nurse will typically make your follow-up appointment when she calls you with your pathology report.  Expect your pathology report 2-3 business days after your surgery.  You may call to check if you do not hear from us after three days. 10. OTHER INSTRUCTIONS:OK TO SHOWER STARTING TOMORROW 11. ICE PACK, TYLENOL, AND IBUPROFEN ALSO FOR PAIN 12. NO VIGOROUS ACTIVITY FOR ONE WEEK _______________________________________________________________________________________________ _____________________________________________________________________________________________________________________________________ _____________________________________________________________________________________________________________________________________ _____________________________________________________________________________________________________________________________________  WHEN TO CALL YOUR DOCTOR: 1. Fever over 101.0 2. Nausea and/or vomiting. 3. Extreme swelling or bruising. 4. Continued bleeding from incision. 5. Increased pain, redness, or drainage from the incision.  The clinic staff is available to answer your questions during regular business hours.  Please don't hesitate to  call and ask to speak to one of the nurses for clinical concerns.  If you have a medical emergency, go to the nearest emergency room or call 911.  A   surgeon from [facility] is always on call at the hospital.  For further questions, please visit [email or web address]  

## 2020-06-12 NOTE — Anesthesia Postprocedure Evaluation (Signed)
Anesthesia Post Note  Patient: Sue Jackson  Procedure(s) Performed: RIGHT BREAST LUMPECTOMY WITH RADIOACTIVE SEED LOCALIZATION (Right Breast)     Patient location during evaluation: PACU Anesthesia Type: General Level of consciousness: awake and alert Pain management: pain level controlled Vital Signs Assessment: post-procedure vital signs reviewed and stable Respiratory status: spontaneous breathing, nonlabored ventilation and respiratory function stable Cardiovascular status: blood pressure returned to baseline and stable Postop Assessment: no apparent nausea or vomiting Anesthetic complications: no   No complications documented.  Last Vitals:  Vitals:   06/12/20 0835 06/12/20 0840  BP: 107/62 117/77  Pulse: (!) 58   Resp: 12   Temp:    SpO2: 97%     Last Pain:  Vitals:   06/12/20 0840  TempSrc:   PainSc: 0-No pain                 Catalina Gravel

## 2020-06-12 NOTE — Anesthesia Procedure Notes (Signed)
Procedure Name: LMA Insertion Date/Time: 06/12/2020 7:41 AM Performed by: Leonor Liv, CRNA Pre-anesthesia Checklist: Patient identified, Emergency Drugs available, Suction available and Patient being monitored Patient Re-evaluated:Patient Re-evaluated prior to induction Oxygen Delivery Method: Circle System Utilized Preoxygenation: Pre-oxygenation with 100% oxygen Induction Type: IV induction Ventilation: Mask ventilation without difficulty LMA: LMA inserted LMA Size: 4.0 Number of attempts: 1 Airway Equipment and Method: Bite block Placement Confirmation: positive ETCO2 Tube secured with: Tape Dental Injury: Teeth and Oropharynx as per pre-operative assessment

## 2020-06-12 NOTE — Transfer of Care (Signed)
Immediate Anesthesia Transfer of Care Note  Patient: Sue Jackson  Procedure(s) Performed: RIGHT BREAST LUMPECTOMY WITH RADIOACTIVE SEED LOCALIZATION (Right Breast)  Patient Location: PACU  Anesthesia Type:General  Level of Consciousness: awake, alert  and oriented  Airway & Oxygen Therapy: Patient Spontanous Breathing and Patient connected to nasal cannula oxygen  Post-op Assessment: Report given to RN, Post -op Vital signs reviewed and stable and Patient moving all extremities  Post vital signs: Reviewed and stable  Last Vitals:  Vitals Value Taken Time  BP 115/71 06/12/20 0821  Temp    Pulse 62 06/12/20 0823  Resp 16 06/12/20 0823  SpO2 99 % 06/12/20 0823  Vitals shown include unvalidated device data.  Last Pain:  Vitals:   06/12/20 0651  TempSrc: Oral  PainSc:       Patients Stated Pain Goal: 5 (95/28/41 3244)  Complications: No complications documented.

## 2020-06-12 NOTE — Interval H&P Note (Signed)
History and Physical Interval Note:no change in H and P  06/12/2020 7:11 AM  Sue Jackson  has presented today for surgery, with the diagnosis of RIGHT BREAST DUCTAL CARCINOMA IN SITU.  The various methods of treatment have been discussed with the patient and family. After consideration of risks, benefits and other options for treatment, the patient has consented to  Procedure(s) with comments: RIGHT BREAST LUMPECTOMY WITH RADIOACTIVE SEED LOCALIZATION (Right) - LMA as a surgical intervention.  The patient's history has been reviewed, patient examined, no change in status, stable for surgery.  I have reviewed the patient's chart and labs.  Questions were answered to the patient's satisfaction.     Coralie Keens

## 2020-06-12 NOTE — Op Note (Signed)
RIGHT BREAST LUMPECTOMY WITH RADIOACTIVE SEED LOCALIZATION  Procedure Note  Vernica Wachtel 06/12/2020   Pre-op Diagnosis: RIGHT BREAST DUCTAL CARCINOMA IN SITU     Post-op Diagnosis: same  Procedure(s): RIGHT BREAST LUMPECTOMY WITH RADIOACTIVE SEED LOCALIZATION  Surgeon(s): Coralie Keens, MD  Anesthesia: General  Staff:  Circulator: Hal Morales, RN Scrub Person: Lovett Sox, CST  Estimated Blood Loss: Minimal               Specimens: sent to path  Indications: This is a 39 year old female who was found on screening mammography to have a 9 mm area of microcalcifications in the right breast centrally.  She had a biopsy of these showing ductal carcinoma in situ.  The decision was made to proceed with a radioactive seed guided right breast lumpectomy  Procedure: The patient was brought to the operating room and identifies correct patient.  She was placed upon on the operating room table and general anesthesia was induced.  Her right breast was prepped and draped in usual sterile fashion.  The radioactive seed was approximately at the 11 o'clock position of the right breast adjacent to the areola.  I located with the neoprobe.  I anesthetized the skin around the areola with Marcaine and then made a circumareolar incision with a scalpel.  I then created skin flaps around the incision with the cautery.  I then performed a lumpectomy staying widely around the radioactive seed with the neoprobe.  I then came under the lumpectomy with the neoprobe again staying widely around the seed and completed the lumpectomy.  The specimen was then completely removed.  I marked all the margins with paint.  An x-ray was performed confirming that the radioactive seed and previous biopsy clip were in the lumpectomy specimen.  The specimen was then sent to pathology for evaluation.  I achieved hemostasis with cautery.  I anesthetized the incision further with Marcaine.  I placed surgical clips at  the periphery of the lumpectomy cavity.  I then closed the subcutaneous tissue with interrupted 3-0 Vicryl sutures and closed the skin with running 4-0 Monocryl.  Dermabond was then applied.  The patient tolerated the procedure well.  All the counts were correct at the end of the procedure.  The patient was then extubated in the operating room and taken in a stable addition to the recovery room.          Coralie Keens   Date: 06/12/2020  Time: 8:13 AM

## 2020-06-13 ENCOUNTER — Encounter (HOSPITAL_COMMUNITY): Payer: Self-pay | Admitting: Surgery

## 2020-06-16 ENCOUNTER — Encounter: Payer: Self-pay | Admitting: *Deleted

## 2020-06-16 LAB — SURGICAL PATHOLOGY

## 2020-06-22 ENCOUNTER — Other Ambulatory Visit: Payer: Self-pay

## 2020-06-22 ENCOUNTER — Ambulatory Visit
Admission: RE | Admit: 2020-06-22 | Discharge: 2020-06-22 | Disposition: A | Discharge status: Home / Self Care | Payer: 59 | Source: Ambulatory Visit | Attending: Family Medicine | Admitting: Family Medicine

## 2020-06-22 DIAGNOSIS — R42 Dizziness and giddiness: Secondary | ICD-10-CM

## 2020-06-22 DIAGNOSIS — R519 Headache, unspecified: Secondary | ICD-10-CM

## 2020-06-22 IMAGING — MR MR HEAD W/O CM
10 series · 48 of 48 positions shown · non-contrast
Comparison: None.

CLINICAL DATA: Headaches.  Dizziness.

EXAM:
MRI HEAD WITHOUT CONTRAST
TECHNIQUE: Multiplanar, multiecho pulse sequences of the brain and surrounding
structures were obtained without intravenous contrast.

[Series 5: T1 · sagittal · 4.0mm · 0.75mm/px · 3 of 31 slices shown (1 of 2)]
[im 1/31]
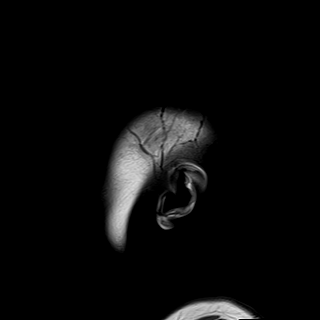
[im 16/31]
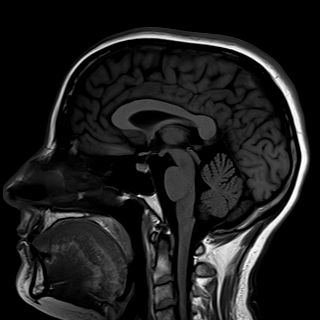
[im 31/31]
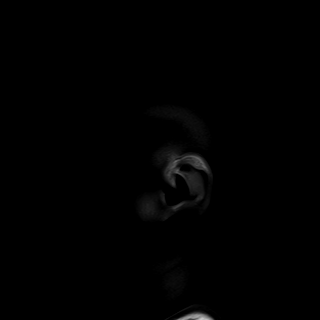

[Series 6: DWI · axial · 3.0mm · 1.44mm/px · z∈[-78,+60]mm · 7 of 86 slices shown (1 of 4)]
[im 1/86]
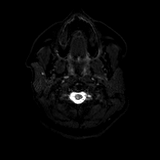
[im 15/86]
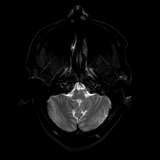
[im 29/86]
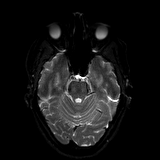
[im 43/86]
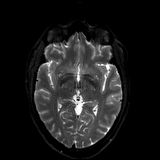
[im 57/86]
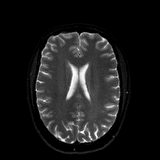
[im 71/86]
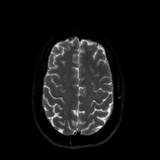
[im 86/86]
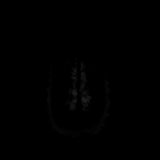

[Series 7: DWI · axial · 3.0mm · 1.44mm/px · z∈[-78,+60]mm · 3 of 42 slices shown (2 of 4)]
[im 1/42]
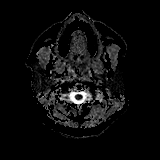
[im 21/42]
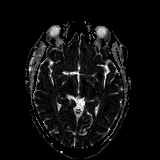
[im 42/42]
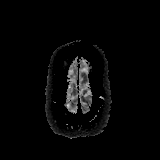

[Series 8: DWI · coronal · 5.0mm · 1.44mm/px · 5 of 64 slices shown (3 of 4)]
[im 1/64]
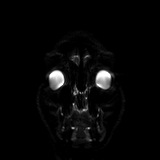
[im 16/64]
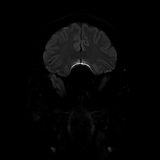
[im 32/64]
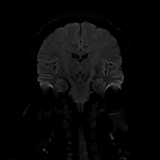
[im 48/64]
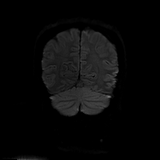
[im 64/64]
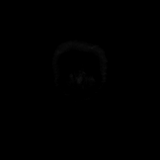

[Series 9: DWI · coronal · 5.0mm · 1.44mm/px · 3 of 32 slices shown (4 of 4)]
[im 1/32]
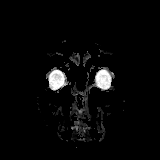
[im 16/32]
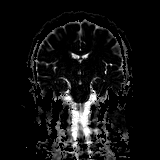
[im 32/32]
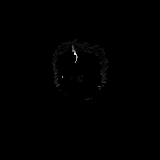

[Series 10: T2 · axial · 4.0mm · 0.36mm/px · z∈[-81,+65]mm · 2 of 29 slices shown (1 of 2)]
[im 1/29]
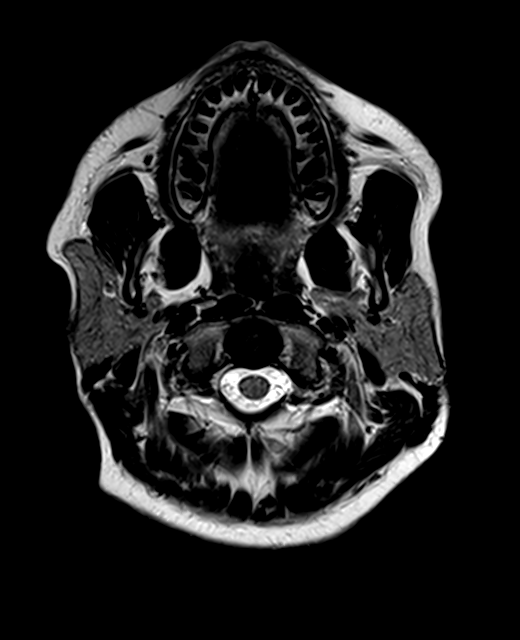
[im 29/29]
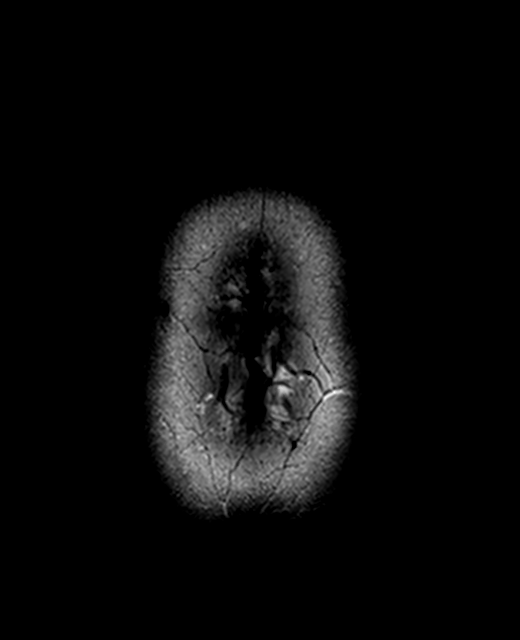

[Series 11: FLAIR · axial · 3.0mm · 0.72mm/px · z∈[-93,+75]mm · 2 of 29 slices shown]
[im 1/29]
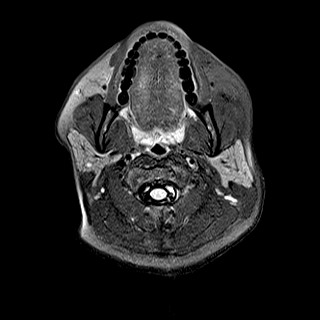
[im 29/29]
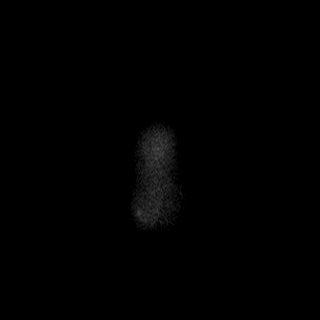

[Series 13: swi_images · axial · 1.5mm · 0.90mm/px · z∈[-79,+63]mm · 8 of 96 slices shown]
[im 1/96]
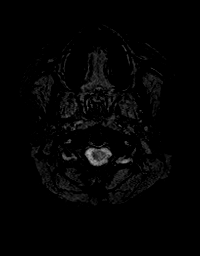
[im 14/96]
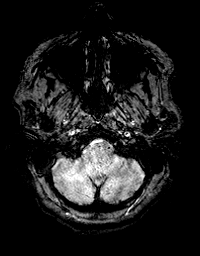
[im 28/96]
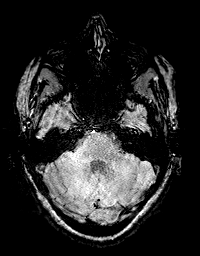
[im 41/96]
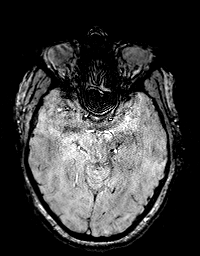
[im 55/96]
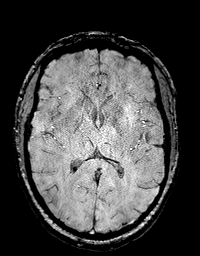
[im 68/96]
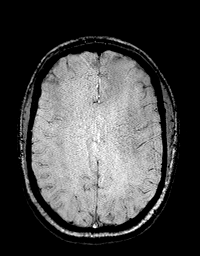
[im 82/96]
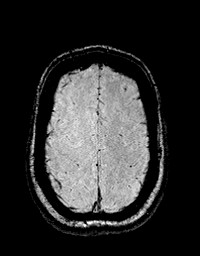
[im 96/96]
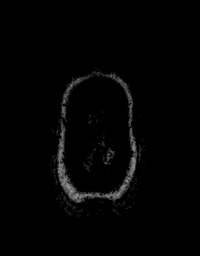

[Series 14: T1 · axial · 1.0mm · 0.94mm/px · z∈[-87,+71]mm · 13 of 160 slices shown (2 of 2)]
[im 1/160]
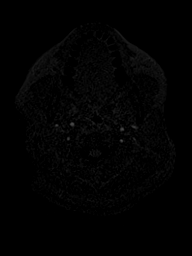
[im 14/160]
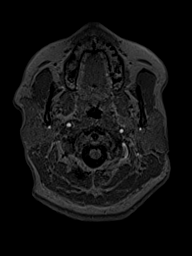
[im 27/160]
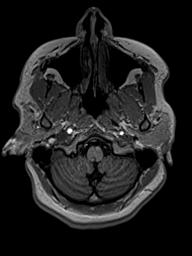
[im 40/160]
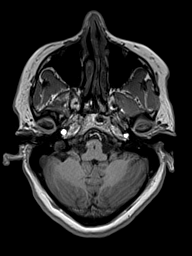
[im 54/160]
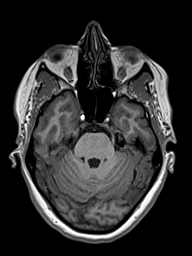
[im 67/160]
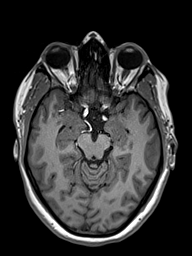
[im 80/160]
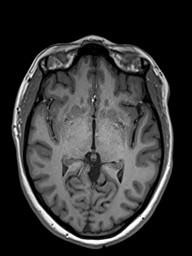
[im 93/160]
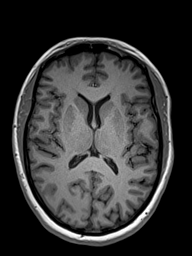
[im 107/160]
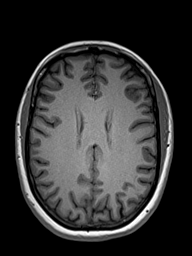
[im 120/160]
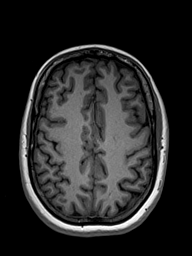
[im 133/160]
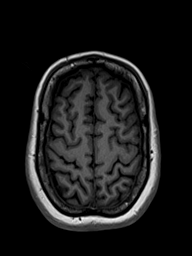
[im 146/160]
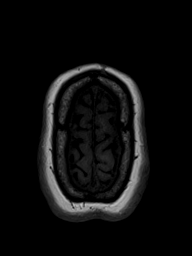
[im 160/160]
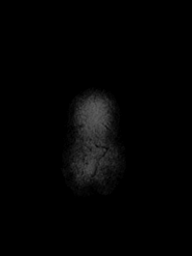

[Series 15: T2 · coronal · 4.5mm · 0.36mm/px · 2 of 30 slices shown (2 of 2)]
[im 1/30]
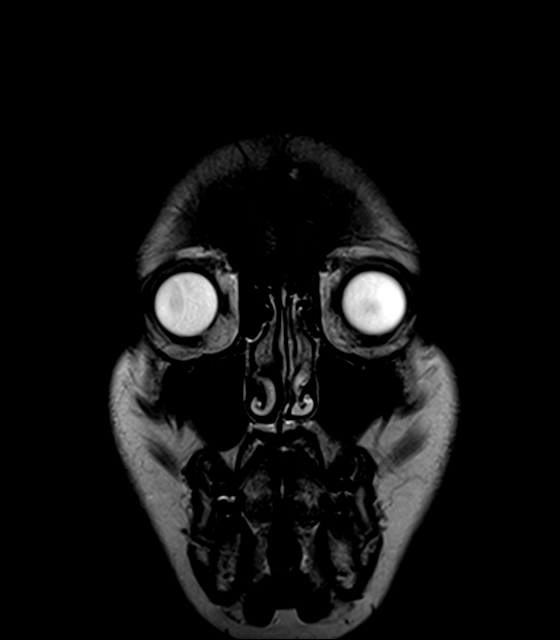
[im 30/30]
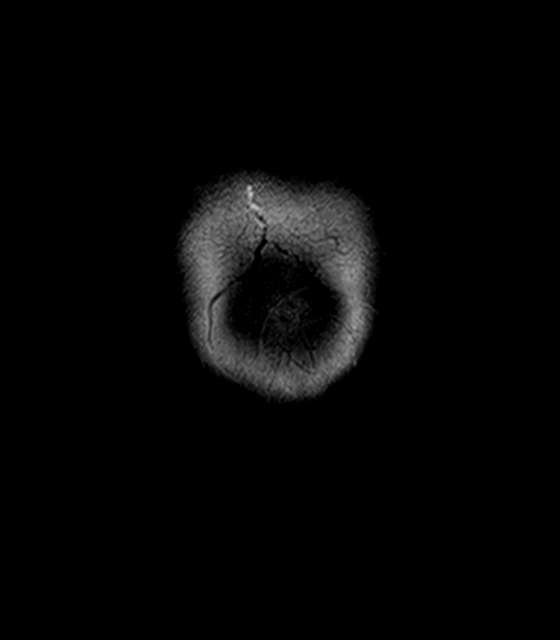

[48 of 48 positions shown; findings below may reference images not displayed]

FINDINGS: Brain: No acute infarction, hemorrhage, hydrocephalus, extra-axial
collection or mass lesion. The brain parenchyma has normal
morphology and signal characteristics.

Vascular: Normal flow voids.

Skull and upper cervical spine: Normal marrow signal.

Sinuses/Orbits: Negative.

Other: None.
IMPRESSION: Normal MRI of the brain.

## 2020-06-25 ENCOUNTER — Telehealth: Payer: Self-pay | Admitting: Family Medicine

## 2020-06-25 ENCOUNTER — Ambulatory Visit (INDEPENDENT_AMBULATORY_CARE_PROVIDER_SITE_OTHER): Payer: 59 | Admitting: Otolaryngology

## 2020-06-25 DIAGNOSIS — Z8669 Personal history of other diseases of the nervous system and sense organs: Secondary | ICD-10-CM

## 2020-06-25 NOTE — Telephone Encounter (Signed)
Referral placed.

## 2020-06-25 NOTE — Telephone Encounter (Signed)
Patient is calling and wanted to see if Dr. Martinique could give her a referral to see a Neurologist for migraines, please advise. CB is [phone]

## 2020-06-25 NOTE — Telephone Encounter (Signed)
Okay to place? 

## 2020-06-25 NOTE — Telephone Encounter (Signed)
It is okay to place referral due to migraines as requested. Thanks, BJ

## 2020-06-26 ENCOUNTER — Ambulatory Visit (INDEPENDENT_AMBULATORY_CARE_PROVIDER_SITE_OTHER): Payer: 59 | Admitting: Otolaryngology

## 2020-06-26 ENCOUNTER — Telehealth (HOSPITAL_COMMUNITY): Payer: Self-pay | Admitting: *Deleted

## 2020-06-26 MED ORDER — CLONAZEPAM 0.5 MG PO TABS: 0.5000 mg | ORAL_TABLET | Freq: Two times a day (BID) | ORAL | 0 refills | Status: DC

## 2020-06-26 NOTE — Telephone Encounter (Addendum)
Pt reported that she has been dealing with a lot of anxiety lately. She recently underwent lumpectomy and is now undergoing radiation therapy. She reported that she feels anxious at work and can not overcome it. She does not feel Lorazepam is effective anymore. She also verbalized being on too many sedating meds now including Topamax for migraine as well as meclizine for vertigo. She stated she was not sure if increasing the dose of lorazepam would be too helpful. Writer recommended a trial of clonazepam 0.5 mg twice a day as clonazepam is not as sedating as lorazepam.  We will see how she does with this switch and then touch base in December at the time for scheduled appointment. Patient verbalized understanding.  Prescription for clonazepam 0.5 mg BID sent to her pharmacy.

## 2020-06-26 NOTE — Addendum Note (Signed)
Addended by: Nevada Crane on: 06/26/2020 09:54 AM   Modules accepted: Orders

## 2020-06-26 NOTE — Telephone Encounter (Signed)
PATIENT CALLED  TODAY STATED THAT SHE IS HAVING A MEDICATION CONCERN & WANTED TO ASK IF SHE  COULD HAVE A INCREASE ON HER MEDICATION.

## 2020-06-30 ENCOUNTER — Other Ambulatory Visit: Payer: 59

## 2020-07-02 ENCOUNTER — Encounter (INDEPENDENT_AMBULATORY_CARE_PROVIDER_SITE_OTHER): Payer: Self-pay | Admitting: Otolaryngology

## 2020-07-02 ENCOUNTER — Other Ambulatory Visit: Payer: Self-pay

## 2020-07-02 ENCOUNTER — Ambulatory Visit (INDEPENDENT_AMBULATORY_CARE_PROVIDER_SITE_OTHER): Payer: 59 | Admitting: Otolaryngology

## 2020-07-02 VITALS — Temp 97.5°F

## 2020-07-02 DIAGNOSIS — F4541 Pain disorder exclusively related to psychological factors: Secondary | ICD-10-CM | POA: Diagnosis not present

## 2020-07-02 NOTE — Progress Notes (Signed)
HPI: Sue Jackson is a 39 y.o. female who presents for evaluation of history of migraine headaches.  She works for the [employer] and describes a lot of anxiety associated with working for the [employer].  She also describes headaches in the back of her neck consistent more with tension type headaches.  She had an MRI scan performed about a month ago that were reviewed that was normal and showed clear paranasal sinuses.  She also describes episodes of dizziness or lightheadedness when she is under a lot of tension. She had previously seen an ENT doctor who works [facility] in [facility] and she diagnosed her with vestibular migraines and treated her with topiramate that she took for short while but has since stopped because of some side effects.  She was referred to a neurologist and has an appointment with neurology next year.  Past Medical History:  Diagnosis Date  . Anemia 2019-2020  . Anxiety   . Arthritis   . Bipolar disorder ([place]) 2013  . Cancer ([place]) 2021  . Depression   . Headache 2021  . Hypertension   . Pre-diabetes    Past Surgical History:  Procedure Laterality Date  . BREAST LUMPECTOMY WITH RADIOACTIVE SEED LOCALIZATION Right 06/12/2020   Procedure: RIGHT BREAST LUMPECTOMY WITH RADIOACTIVE SEED LOCALIZATION;  Surgeon: Coralie Keens, MD;  Location: [facility];  Service: General;  Laterality: Right;  LMA   Social History   Socioeconomic History  . Marital status: Married    Spouse name: Not on file  . Number of children: 0  . Years of education: Not on file  . Highest education level: Not on file  Occupational History  . Not on file  Tobacco Use  . Smoking status: Former Research scientist (life sciences)  . Smokeless tobacco: Never Used  . Tobacco comment: social smoker  Vaping Use  . Vaping Use: Never used  Substance and Sexual Activity  . Alcohol use: Yes    Comment: occasionally  . Drug use: Yes    Types: Marijuana  . Sexual activity: Yes    Birth control/protection:  None  Other Topics Concern  . Not on file  Social History Narrative  . Not on file   Social Determinants of Health   Financial Resource Strain: Low Risk   . Difficulty of Paying Living Expenses: Not very hard  Food Insecurity: No Food Insecurity  . Worried About Charity fundraiser in the Last Year: Never true  . Ran Out of Food in the Last Year: Never true  Transportation Needs: No Transportation Needs  . Lack of Transportation (Medical): No  . Lack of Transportation (Non-Medical): No  Physical Activity:   . Days of Exercise per Week: Not on file  . Minutes of Exercise per Session: Not on file  Stress:   . Feeling of Stress : Not on file  Social Connections:   . Frequency of Communication with Friends and Family: Not on file  . Frequency of Social Gatherings with Friends and Family: Not on file  . Attends Religious Services: Not on file  . Active Member of Clubs or Organizations: Not on file  . Attends Archivist Meetings: Not on file  . Marital Status: Not on file   Family History  Problem Relation Age of Onset  . Arthritis Mother   . Asthma Mother   . Cancer Maternal Aunt 59       breast  . Depression Maternal Aunt   . Hypertension Maternal Aunt   .  Breast cancer Maternal Aunt   . Diabetes Maternal Grandfather   . Heart disease Maternal Grandfather   . Hypertension Maternal Grandfather   . Stroke Maternal Grandfather   . Cancer Maternal Aunt 39       breast cancer    No Known Allergies Prior to Admission medications   Medication Sig Start Date End Date Taking? Authorizing Provider  amLODipine-benazepril (LOTREL) 5-10 MG capsule TAKE 1 CAPSULE BY MOUTH EVERY DAY Patient taking differently: Take 1 capsule by mouth daily.  10/30/19  Yes Martinique, Betty G, MD  clonazePAM (KLONOPIN) 0.5 MG tablet Take 1 tablet (0.5 mg total) by mouth 2 (two) times daily. 06/26/20 06/26/21 Yes Nevada Crane, MD  meclizine (ANTIVERT) 25 MG tablet Take 1 tablet (25 mg total) by  mouth 3 (three) times daily as needed for dizziness. 05/06/20  Yes Martinique, Betty G, MD  oxyCODONE (OXY IR/ROXICODONE) 5 MG immediate release tablet Take 1 tablet (5 mg total) by mouth every 6 (six) hours as needed for moderate pain or severe pain. 06/12/20  Yes Coralie Keens, MD  propranolol (INDERAL) 40 MG tablet Take 0.5 tablets (20 mg total) by mouth daily. 10/30/19  Yes Martinique, Betty G, MD  topiramate (TOPAMAX) 25 MG tablet Take 50 mg by mouth at bedtime.  05/24/20  Yes [provider]     Positive ROS: Otherwise negative  All other systems have been reviewed and were otherwise negative with the exception of those mentioned in the HPI and as above.  Physical Exam: Constitutional: Alert, well-appearing, no acute distress Ears: External ears without lesions or tenderness. Ear canals are clear bilaterally.  TMs are clear bilaterally with good hearing in both ears.  She has no evidence of BPPV. Nasal: External nose without lesions. Clear nasal passages Oral: Lips and gums without lesions. Tongue and palate mucosa without lesions. Posterior oropharynx clear. Neck: No palpable adenopathy or masses Respiratory: Breathing comfortably  Skin: No facial/neck lesions or rash noted.  Procedures  Assessment: Discussed with her that I think the headaches in the back of her head are probably more tension related and related to migraines.  Plan: Reviewed with her concerning trying to avoid what ever is causing the tension and stress if possible. Also discussed with her that I do not treat migraine headaches with medication and would refer to neurology. She will follow-up as needed.  Radene Journey, MD

## 2020-07-14 ENCOUNTER — Encounter: Payer: Self-pay | Admitting: Radiation Oncology

## 2020-07-14 ENCOUNTER — Other Ambulatory Visit: Payer: Self-pay

## 2020-07-14 ENCOUNTER — Telehealth: Payer: Self-pay

## 2020-07-14 NOTE — Telephone Encounter (Signed)
Patient has telephone visit with Shona Simpson PA on 07/15/20 @ 10:30am. Patient verbalized understanding of the appointment date and time. Meaningful use was reviewed. TM  Called patient back to advise of Sim appointment on WednesdayTM

## 2020-07-15 ENCOUNTER — Telehealth: Payer: Self-pay | Admitting: Genetic Counselor

## 2020-07-15 ENCOUNTER — Ambulatory Visit
Admission: RE | Admit: 2020-07-15 | Discharge: 2020-07-15 | Disposition: A | Discharge status: Home / Self Care | Payer: 59 | Source: Ambulatory Visit | Attending: Radiation Oncology | Admitting: Radiation Oncology

## 2020-07-15 ENCOUNTER — Other Ambulatory Visit: Payer: Self-pay

## 2020-07-15 DIAGNOSIS — D0511 Intraductal carcinoma in situ of right breast: Secondary | ICD-10-CM

## 2020-07-15 NOTE — Telephone Encounter (Signed)
scheduled appt per 12/7 sch msg - pt is aware of apt.

## 2020-07-15 NOTE — Progress Notes (Signed)
Radiation Oncology         [phone] ________________________________  Outpatient Follow Up- Conducted via telephone due to current COVID-19 concerns for limiting patient exposure  I spoke with the patient to conduct this consult visit via telephone to spare the patient unnecessary potential exposure in the healthcare setting during the current COVID-19 pandemic. The patient was notified in advance and was offered a [place] meeting to allow for face to face communication but unfortunately reported that they did not have the appropriate resources/technology to support such a visit and instead preferred to proceed with a telephone visit.   Name: Sue Jackson        MRN: [id number]  Date of Service: 07/15/2020 DOB: 14-Jul-1981  WG:[id number], Malka So, MD  Truitt Merle, MD     REFERRING PHYSICIAN: Truitt Merle, MD   DIAGNOSIS: The encounter diagnosis was Ductal carcinoma in situ (DCIS) of right breast.   HISTORY OF PRESENT ILLNESS: Sue Jackson is a 39 y.o. female originally seen in the multidisciplinary breast clinic for a new diagnosis of right breast cancer. The patient was noted to have screening calcifications in the right breast and left breast assymmetry on her first mammogram. She underwent further imaging revealing the right breast calcifications measuring 9 mm. No axillary ultrasound was performed. A biopsy of the right breast on 04/09/20 revealed a high grade DCIS that was ER/PR positive.  She did undergo a stereotactic biopsy of the last asymmetry on 04/24/2020, which confirmed only fibroadenomatoid changes and fibrocystic change with apocrine metaplasia, no cancer was seen in the biopsy specimens.  She then underwent right lumpectomy on 06/12/2020 which revealed an intermediate grade DCIS measuring 9 mm, her margins were negative, the closest margin was the anterior margin at 3 mm.  She is contacted today by phone to discuss options of adjuvant radiotherapy.  Of note the  patient does qualify for genetic testing given her personal and family history she has been offered referral during her initial evaluation but desired to hold off at the time.   PREVIOUS RADIATION THERAPY: No   PAST MEDICAL HISTORY:  Past Medical History:  Diagnosis Date  . Anemia 2019-2020  . Anxiety   . Arthritis   . Bipolar disorder ([place]) 2013  . Cancer ([place]) 2021  . Depression   . Headache 2021  . Hypertension   . Pre-diabetes        PAST SURGICAL HISTORY: Past Surgical History:  Procedure Laterality Date  . BREAST LUMPECTOMY WITH RADIOACTIVE SEED LOCALIZATION Right 06/12/2020   Procedure: RIGHT BREAST LUMPECTOMY WITH RADIOACTIVE SEED LOCALIZATION;  Surgeon: Coralie Keens, MD;  Location: [facility];  Service: General;  Laterality: Right;  LMA     FAMILY HISTORY:  Family History  Problem Relation Age of Onset  . Arthritis Mother   . Asthma Mother   . Cancer Maternal Aunt 59       breast  . Depression Maternal Aunt   . Hypertension Maternal Aunt   . Breast cancer Maternal Aunt   . Diabetes Maternal Grandfather   . Heart disease Maternal Grandfather   . Hypertension Maternal Grandfather   . Stroke Maternal Grandfather   . Cancer Maternal Aunt 39       breast cancer      SOCIAL HISTORY:  reports that she has quit smoking. She has never used smokeless tobacco. She reports current alcohol use. She reports current drug use. Drug: Marijuana. The patient is married and lives in [place]. She and her wife  both work for the [employer]. 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    ALLERGIES: Patient has no known allergies.   MEDICATIONS:  Current Outpatient Medications  Medication Sig Dispense Refill  . amLODipine-benazepril (LOTREL) 5-10 MG capsule TAKE 1 CAPSULE BY MOUTH EVERY DAY (Patient taking differently: Take 1 capsule by mouth daily. ) 90 capsule 2  . clonazePAM (KLONOPIN) 0.5 MG tablet Take 1 tablet (0.5 mg total) by mouth 2 (two) times daily. 60 tablet 0   . meclizine (ANTIVERT) 25 MG tablet Take 1 tablet (25 mg total) by mouth 3 (three) times daily as needed for dizziness. 9045 tablet 1  . oxyCODONE (OXY IR/ROXICODONE) 5 MG immediate release tablet Take 1 tablet (5 mg total) by mouth every 6 (six) hours as needed for moderate pain or severe pain. 25 tablet 0  . propranolol (INDERAL) 40 MG tablet Take 0.5 tablets (20 mg total) by mouth daily. 90 tablet 2  . topiramate (TOPAMAX) 25 MG tablet Take 50 mg by mouth at bedtime.  (Patient not taking: Reported on 07/14/2020)     No current facility-administered medications for this encounter.     REVIEW OF SYSTEMS: On review of systems, the patient reports that she is doing well overall. She has been seen by ENT to work up migraine headaches that have been felt to have a vestibular component. She has been referred back to a neurologist for evaluation and management of this. She feels that these are less frequent now with better anxiety medication management. She feels though that she is doing very well with her surgical recovery. She denies any concerns about her incision site and feels that it is barely visible. No other complaints are verbalized.    PHYSICAL EXAM:  Unable to assess due to encounter type.   ECOG = 0  0 - Asymptomatic (Fully active, able to carry on all predisease activities without restriction)  1 - Symptomatic but completely ambulatory (Restricted in physically strenuous activity but ambulatory and able to carry out work of a light or sedentary nature. For example, light housework, office work)  2 - Symptomatic, <50% in bed during the day (Ambulatory and capable of all self care but unable to carry out any work activities. Up and about more than 50% of waking hours)  3 - Symptomatic, >50% in bed, but not bedbound (Capable of only limited self-care, confined to bed or chair 50% or more of waking hours)  4 - Bedbound (Completely disabled. Cannot carry on any self-care. Totally  confined to bed or chair)  5 - Death   Eustace Pen MM, Creech RH, Tormey DC, et al. [phone]). "Toxicity and response criteria of the [facility]". [place] Oncol. 5 (6): 649-55    LABORATORY DATA:  Lab Results  Component Value Date   WBC 11.1 (H) 06/05/2020   HGB 14.6 06/05/2020   HCT 45.1 06/05/2020   MCV 81.6 06/05/2020   PLT 402 (H) 06/05/2020   Lab Results  Component Value Date   NA 134 (L) 06/05/2020   K 4.2 06/05/2020   CL 104 06/05/2020   CO2 19 (L) 06/05/2020   Lab Results  Component Value Date   ALT 27 04/16/2020   AST 14 (L) 04/16/2020   ALKPHOS 75 04/16/2020   BILITOT 0.3 04/16/2020      RADIOGRAPHY: MR Brain Wo Contrast  Result Date: 06/22/2020 CLINICAL DATA:  Headaches.  Dizziness. EXAM: MRI HEAD WITHOUT CONTRAST TECHNIQUE: Multiplanar, multiecho pulse sequences of the brain  and surrounding structures were obtained without intravenous contrast. COMPARISON:  None. FINDINGS: Brain: No acute infarction, hemorrhage, hydrocephalus, extra-axial collection or mass lesion. The brain parenchyma has normal morphology and signal characteristics. Vascular: Normal flow voids. Skull and upper cervical spine: Normal marrow signal. Sinuses/Orbits: Negative. Other: None. IMPRESSION: Normal MRI of the brain. Electronically Signed   By: Pedro Earls M.D.   On: 06/22/2020 19:09       IMPRESSION/PLAN: 1. Intermediate grade, ER/PR positive DCIS of the right breast. Dr. Lisbeth Renshaw discusses the final results of pathology   and reviews the nature of noninvasive right breast disease. Dr. Lisbeth Renshaw discusses the role of adjuvant external radiotherapy to the breast  to reduce risks of local recurrence followed by antiestrogen therapy. We discussed the risks, benefits, short, and long term effects of radiotherapy, as well as the curative intent, and the patient is interested in proceeding. Dr. Lisbeth Renshaw discusses the delivery and logistics of radiotherapy and  anticipates a course of 6 1/2 weeks of radiotherapy and she is scheduled for simulation tomorrow at which time she will sign written consent to proceed.  2. Contraceptive Counseling. The patient is not currently at risk of pregnancy in her relationship. She and her wife have considered having children in the future but favor adoption at this time. She is aware that she should not become pregnant during treatment, but does not need urine pregnancy work up prior to radiotherapy. 3. Possible genetic predisposition to malignancy. The patient is a candidate for genetic testing given her personal and family history. She was offered referral and is interested in meeting and this scheduled.     Given current concerns for patient exposure during the COVID-19 pandemic, this encounter was conducted via telephone.  The patient has provided two factor identification and has given verbal consent for this type of encounter and has been advised to only accept a meeting of this type in a secure network environment. The time spent during this encounter was 45 minutes including preparation, discussion, and coordination of the patient's care. The attendants for this meeting include Dr. Lisbeth Renshaw, Hayden Pedro  and Berenice Bouton [place].  During the encounter,  Dr. Lisbeth Renshaw, and Hayden Pedro were located at [facility] Radiation Oncology Department.  Belinda Schlichting was located at home.  The above documentation reflects my direct findings during this shared patient visit. Please see the separate note by Dr. Lisbeth Renshaw on this date for the remainder of the patient's plan of care.    Carola Rhine, PAC

## 2020-07-16 ENCOUNTER — Other Ambulatory Visit: Payer: Self-pay

## 2020-07-16 ENCOUNTER — Ambulatory Visit
Admission: RE | Admit: 2020-07-16 | Discharge: 2020-07-16 | Disposition: A | Discharge status: Home / Self Care | Payer: 59 | Source: Ambulatory Visit | Attending: Radiation Oncology | Admitting: Radiation Oncology

## 2020-07-16 DIAGNOSIS — Z510 Encounter for antineoplastic radiation therapy: Secondary | ICD-10-CM | POA: Insufficient documentation

## 2020-07-16 DIAGNOSIS — D0511 Intraductal carcinoma in situ of right breast: Secondary | ICD-10-CM | POA: Insufficient documentation

## 2020-07-17 ENCOUNTER — Telehealth (HOSPITAL_COMMUNITY): Payer: 59 | Admitting: Psychiatry

## 2020-07-17 ENCOUNTER — Encounter: Payer: Self-pay | Admitting: *Deleted

## 2020-07-18 ENCOUNTER — Telehealth: Payer: Self-pay | Admitting: Hematology

## 2020-07-18 NOTE — Telephone Encounter (Signed)
Scheduled appt per 12/09 sch msg - mailed letter with appt date and time

## 2020-07-22 DIAGNOSIS — Z510 Encounter for antineoplastic radiation therapy: Secondary | ICD-10-CM | POA: Diagnosis not present

## 2020-07-23 ENCOUNTER — Ambulatory Visit
Admission: RE | Admit: 2020-07-23 | Discharge: 2020-07-23 | Disposition: A | Discharge status: Home / Self Care | Payer: 59 | Source: Ambulatory Visit | Attending: Radiation Oncology | Admitting: Radiation Oncology

## 2020-07-23 ENCOUNTER — Other Ambulatory Visit: Payer: Self-pay

## 2020-07-23 DIAGNOSIS — Z510 Encounter for antineoplastic radiation therapy: Secondary | ICD-10-CM | POA: Diagnosis not present

## 2020-07-24 ENCOUNTER — Ambulatory Visit
Admission: RE | Admit: 2020-07-24 | Discharge: 2020-07-24 | Disposition: A | Discharge status: Home / Self Care | Payer: 59 | Source: Ambulatory Visit | Attending: Radiation Oncology | Admitting: Radiation Oncology

## 2020-07-24 ENCOUNTER — Other Ambulatory Visit: Payer: Self-pay

## 2020-07-24 DIAGNOSIS — Z510 Encounter for antineoplastic radiation therapy: Secondary | ICD-10-CM | POA: Diagnosis not present

## 2020-07-24 NOTE — Progress Notes (Signed)
Pt here for patient teaching.  Pt given Radiation and You booklet, skin care instructions, Alra deodorant and Radiaplex gel.  Reviewed areas of pertinence such as fatigue, hair loss, skin changes, breast tenderness and breast swelling . Pt able to give teach back of to pat skin and use unscented/gentle soap,apply Radiaplex bid, avoid applying anything to skin within 4 hours of treatment, avoid wearing an under wire bra and to use an electric razor if they must shave. Pt verbalizes understanding of information given and will contact nursing with any questions or concerns.     Levi Crass M. Laksh Hinners RN, BSN      

## 2020-07-25 ENCOUNTER — Ambulatory Visit
Admission: RE | Admit: 2020-07-25 | Discharge: 2020-07-25 | Disposition: A | Discharge status: Home / Self Care | Payer: 59 | Source: Ambulatory Visit | Attending: Radiation Oncology | Admitting: Radiation Oncology

## 2020-07-25 DIAGNOSIS — Z510 Encounter for antineoplastic radiation therapy: Secondary | ICD-10-CM | POA: Diagnosis not present

## 2020-07-25 DIAGNOSIS — D0511 Intraductal carcinoma in situ of right breast: Secondary | ICD-10-CM

## 2020-07-25 MED ORDER — ALRA NON-METALLIC DEODORANT (RAD-ONC)
1.0000 "application " | Freq: Once | TOPICAL | Status: AC
  Administered 2020-07-25: 1 via TOPICAL

## 2020-07-25 MED ORDER — RADIAPLEXRX EX GEL: Freq: Once | CUTANEOUS | Status: AC

## 2020-07-28 ENCOUNTER — Ambulatory Visit
Admission: RE | Admit: 2020-07-28 | Discharge: 2020-07-28 | Disposition: A | Discharge status: Home / Self Care | Payer: 59 | Source: Ambulatory Visit | Attending: Radiation Oncology | Admitting: Radiation Oncology

## 2020-07-28 DIAGNOSIS — Z510 Encounter for antineoplastic radiation therapy: Secondary | ICD-10-CM | POA: Diagnosis not present

## 2020-07-29 ENCOUNTER — Ambulatory Visit
Admission: RE | Admit: 2020-07-29 | Discharge: 2020-07-29 | Disposition: A | Discharge status: Home / Self Care | Payer: 59 | Source: Ambulatory Visit | Attending: Radiation Oncology | Admitting: Radiation Oncology

## 2020-07-29 DIAGNOSIS — Z510 Encounter for antineoplastic radiation therapy: Secondary | ICD-10-CM | POA: Diagnosis not present

## 2020-07-30 ENCOUNTER — Ambulatory Visit
Admission: RE | Admit: 2020-07-30 | Discharge: 2020-07-30 | Disposition: A | Discharge status: Home / Self Care | Payer: 59 | Source: Ambulatory Visit | Attending: Radiation Oncology | Admitting: Radiation Oncology

## 2020-07-30 DIAGNOSIS — Z510 Encounter for antineoplastic radiation therapy: Secondary | ICD-10-CM | POA: Diagnosis not present

## 2020-07-31 ENCOUNTER — Ambulatory Visit
Admission: RE | Admit: 2020-07-31 | Discharge: 2020-07-31 | Disposition: A | Discharge status: Home / Self Care | Payer: 59 | Source: Ambulatory Visit | Attending: Radiation Oncology | Admitting: Radiation Oncology

## 2020-07-31 ENCOUNTER — Other Ambulatory Visit: Payer: Self-pay

## 2020-07-31 DIAGNOSIS — Z510 Encounter for antineoplastic radiation therapy: Secondary | ICD-10-CM | POA: Diagnosis not present

## 2020-08-04 ENCOUNTER — Ambulatory Visit
Admission: RE | Admit: 2020-08-04 | Discharge: 2020-08-04 | Disposition: A | Discharge status: Home / Self Care | Payer: 59 | Source: Ambulatory Visit | Attending: Radiation Oncology | Admitting: Radiation Oncology

## 2020-08-04 DIAGNOSIS — Z510 Encounter for antineoplastic radiation therapy: Secondary | ICD-10-CM | POA: Diagnosis not present

## 2020-08-05 ENCOUNTER — Ambulatory Visit
Admission: RE | Admit: 2020-08-05 | Discharge: 2020-08-05 | Disposition: A | Discharge status: Home / Self Care | Payer: 59 | Source: Ambulatory Visit | Attending: Radiation Oncology | Admitting: Radiation Oncology

## 2020-08-05 DIAGNOSIS — Z510 Encounter for antineoplastic radiation therapy: Secondary | ICD-10-CM | POA: Diagnosis not present

## 2020-08-06 ENCOUNTER — Other Ambulatory Visit (HOSPITAL_COMMUNITY): Payer: Self-pay | Admitting: Psychiatry

## 2020-08-06 ENCOUNTER — Ambulatory Visit: Payer: 59

## 2020-08-06 ENCOUNTER — Telehealth (HOSPITAL_COMMUNITY): Payer: Self-pay | Admitting: *Deleted

## 2020-08-06 MED ORDER — CLONAZEPAM 0.5 MG PO TABS: 0.5000 mg | ORAL_TABLET | Freq: Two times a day (BID) | ORAL | 1 refills | Status: DC

## 2020-08-06 NOTE — Addendum Note (Signed)
Addended by: Zena Amos on: 08/06/2020 11:18 AM   Modules accepted: Orders

## 2020-08-06 NOTE — Telephone Encounter (Signed)
Rx sent 

## 2020-08-06 NOTE — Telephone Encounter (Signed)
VM left for writer requesting a refill on her Klonipin before the next holiday, as she is out now.  She has not been seen in sometime and has had several missed appts. She is being treated currently for breast cancer. Next available appt with Dr Evelene Croon scheduled for 09/12/20 at 910. Will make request of Dr Evelene Croon to call her medicine in if she agrees, if not will call her and give her walk in information to be seen.

## 2020-08-07 ENCOUNTER — Ambulatory Visit
Admission: RE | Admit: 2020-08-07 | Discharge: 2020-08-07 | Disposition: A | Discharge status: Home / Self Care | Payer: 59 | Source: Ambulatory Visit | Attending: Radiation Oncology | Admitting: Radiation Oncology

## 2020-08-07 DIAGNOSIS — Z510 Encounter for antineoplastic radiation therapy: Secondary | ICD-10-CM | POA: Diagnosis not present

## 2020-08-11 ENCOUNTER — Other Ambulatory Visit: Payer: Self-pay | Admitting: Genetic Counselor

## 2020-08-11 ENCOUNTER — Other Ambulatory Visit: Payer: Self-pay

## 2020-08-11 ENCOUNTER — Encounter: Payer: Self-pay | Admitting: Genetic Counselor

## 2020-08-11 ENCOUNTER — Inpatient Hospital Stay: Payer: 59

## 2020-08-11 ENCOUNTER — Ambulatory Visit
Admission: RE | Admit: 2020-08-11 | Discharge: 2020-08-11 | Disposition: A | Discharge status: Home / Self Care | Payer: 59 | Source: Ambulatory Visit | Attending: Radiation Oncology | Admitting: Radiation Oncology

## 2020-08-11 ENCOUNTER — Inpatient Hospital Stay (HOSPITAL_BASED_OUTPATIENT_CLINIC_OR_DEPARTMENT_OTHER): Payer: 59 | Admitting: Genetic Counselor

## 2020-08-11 DIAGNOSIS — D0511 Intraductal carcinoma in situ of right breast: Secondary | ICD-10-CM

## 2020-08-11 DIAGNOSIS — Z510 Encounter for antineoplastic radiation therapy: Secondary | ICD-10-CM | POA: Insufficient documentation

## 2020-08-11 DIAGNOSIS — Z833 Family history of diabetes mellitus: Secondary | ICD-10-CM | POA: Insufficient documentation

## 2020-08-11 DIAGNOSIS — I10 Essential (primary) hypertension: Secondary | ICD-10-CM | POA: Insufficient documentation

## 2020-08-11 DIAGNOSIS — Z8249 Family history of ischemic heart disease and other diseases of the circulatory system: Secondary | ICD-10-CM | POA: Insufficient documentation

## 2020-08-11 DIAGNOSIS — Z803 Family history of malignant neoplasm of breast: Secondary | ICD-10-CM | POA: Insufficient documentation

## 2020-08-11 DIAGNOSIS — Z800 Family history of malignant neoplasm of digestive organs: Secondary | ICD-10-CM | POA: Insufficient documentation

## 2020-08-11 DIAGNOSIS — Z87891 Personal history of nicotine dependence: Secondary | ICD-10-CM | POA: Insufficient documentation

## 2020-08-11 DIAGNOSIS — F418 Other specified anxiety disorders: Secondary | ICD-10-CM | POA: Insufficient documentation

## 2020-08-11 LAB — GENETIC SCREENING ORDER

## 2020-08-11 NOTE — Progress Notes (Signed)
REFERRING PROVIDER: Truitt Merle, MD [address] [place],  [place] [address]  PRIMARY PROVIDER:  Martinique, Betty G, MD  PRIMARY REASON FOR VISIT:  1. Ductal carcinoma in situ (DCIS) of right breast   2. Family history of breast cancer      HISTORY OF PRESENT ILLNESS:   Ms. Felber, a 40 y.o. female, was seen for a [facility] cancer genetics consultation at the request of Dr. Burr Medico due to a personal and family history of breast cancer.  Ms. Sarnowski presents to clinic today to discuss the possibility of a hereditary predisposition to cancer, genetic testing, and to further clarify her future cancer risks, as well as potential cancer risks for family members.   In August 2021, at the age of 9, Ms. Riemann was diagnosed with DICS of the right breast. The treatment plan included a lumpectomy and radiation.    CANCER HISTORY:  Oncology History Overview Note  Cancer Staging No matching staging information was found for the patient.    Ductal carcinoma in situ (DCIS) of right breast  04/01/2020 Mammogram   IMPRESSION: 1. Indeterminate 47m right breast calcifications. 2. Probably benign left breast masses x2, one of which may correspond with a complicated cyst at the 4 o'clock retroareolar Position measures 7 x 5 x 2 mm.   04/09/2020 Initial Biopsy   Diagnosis Breast, right, needle core biopsy, upper outer - DUCTAL CARCINOMA IN SITU, HIGH-GRADE WITH FOCAL NECROSIS AND CALCIFICATIONS. SEE NOTE Diagnosis Note DCIS measures 0.2 cm in greatest linear dimension. A breast prognostic profile (ER, PR) is pending and will be reported in an addendum. Dr. PSaralyn Pilarreviewed the case and concurs with the diagnosis. [facility] on 04/10/2020.     PROGNOSTIC INDICATORS Results: IMMUNOHISTOCHEMICAL AND MORPHOMETRIC ANALYSIS PERFORMED MANUALLY Estrogen Receptor: 90%, POSITIVE, MODERATE STAINING INTENSITY Progesterone Receptor: 80%, POSITIVE, STRONG  STAINING INTENSITY   04/11/2020 Initial Diagnosis   Ductal carcinoma in situ (DCIS) of right breast      RISK FACTORS:  Menarche was at age 40  First live birth at age N/A.  OCP use for approximately 2-3 years.  Ovaries intact: yes.  Hysterectomy: no.  Menopausal status: perimenopausal.  HRT use: 0 years. Colonoscopy: no; not examined. Mammogram within the last year: yes. Number of breast biopsies: 1. Up to date with pelvic exams: yes. Any excessive radiation exposure in the past: no  Past Medical History:  Diagnosis Date  . Anemia 2019-2020  . Anxiety   . Arthritis   . [place]  . Cancer ([place] 2021  . Depression   . Family history of breast cancer   . Headache 2021  . Hypertension   . Pre-diabetes     Past Surgical History:  Procedure Laterality Date  . BREAST LUMPECTOMY WITH RADIOACTIVE SEED LOCALIZATION Right 06/12/2020   Procedure: RIGHT BREAST LUMPECTOMY WITH RADIOACTIVE SEED LOCALIZATION;  Surgeon: BCoralie Keens MD;  [facility]  Service: General;  Laterality: Right;  LMA    Social History   Socioeconomic History  . Marital status: Married    Spouse name: Not on file  . Number of children: 0  . Years of education: Not on file  . Highest education level: Not on file  Occupational History  . Not on file  Tobacco Use  . Smoking status: Former SResearch scientist (life sciences) . Smokeless tobacco: Never Used  . Tobacco comment: social smoker  Vaping Use  . Vaping Use: Never used  Substance and Sexual  Activity  . Alcohol use: Yes    Comment: occasionally  . Drug use: Yes    Types: Marijuana  . Sexual activity: Yes    Birth control/protection: None  Other Topics Concern  . Not on file  Social History Narrative  . Not on file   Social Determinants of Health   Financial Resource Strain: Low Risk   . Difficulty of Paying Living Expenses: Not very hard  Food Insecurity: No Food Insecurity  . Worried About Charity fundraiser in the Last Year:  Never true  . Ran Out of Food in the Last Year: Never true  Transportation Needs: No Transportation Needs  . Lack of Transportation (Medical): No  . Lack of Transportation (Non-Medical): No  Physical Activity: Not on file  Stress: Not on file  Social Connections: Not on file     FAMILY HISTORY:  We obtained a detailed, 4-generation family history.  Significant diagnoses are listed below: Family History  Problem Relation Age of Onset  . Arthritis Mother   . Asthma Mother   . Cirrhosis Father        d. 34  . Depression Maternal Aunt   . Hypertension Maternal Aunt   . Breast cancer Maternal Aunt 51  . Diabetes Maternal Grandfather   . Heart disease Maternal Grandfather   . Hypertension Maternal Grandfather   . Stroke Maternal Grandfather   . Breast cancer Maternal Aunt 55  . Breast cancer Paternal Aunt   . Breast cancer Cousin        pat first cousin    The patient does not have children.  She has two sisters who are cancer free.  Her father is deceased and her mother is living.  Her father died at 66 from liver cirrhosis.  He had a sister and two brothers, the sister had breast cancer, and had a daughter with breast cancer.  The patient is not close to this side of the family and does not know much other information.  The patient's mother is in her late 58's.  She has two sisters and two brothers.  Both sister had breast cancer in their 65's.  The maternal grandparents are deceased.  Ms. Purdum is unaware of previous family history of genetic testing for hereditary cancer risks. Patient's maternal ancestors are of [place] descent, and paternal ancestors are of [place] descent. There is no reported Ashkenazi Jewish ancestry. There is no known consanguinity.  GENETIC COUNSELING ASSESSMENT: Ms. Ruperto is a 40 y.o. female with a personal and family history of breast cancer which is somewhat suggestive of a hereditary cancer syndrome and predisposition to cancer given  her age of onset and the number of women with breast cancer in the family. We, therefore, discussed and recommended the following at today's visit.   DISCUSSION: We discussed that 5 - 10% of breast cancer is hereditary, with most cases associated with BRCA mutations.  There are other genes that can be associated with hereditary breast cancer syndromes.  These include ATM, CHEK2 and PALB2.  Based on the family history, she has bilineal risk, meaning that there is a risk for a hereditary syndrome coming from both the maternal and paternal side of the family.  We discussed that testing is beneficial for several reasons including knowing how to follow individuals after completing their treatment, identifying whether potential treatment options such as PARP inhibitors would be beneficial, and understand if other family members could be at risk for cancer and allow them  to undergo genetic testing.   We reviewed the characteristics, features and inheritance patterns of hereditary cancer syndromes. We also discussed genetic testing, including the appropriate family members to test, the process of testing, insurance coverage and turn-around-time for results. We discussed the implications of a negative, positive, carrier and/or variant of uncertain significant result. We recommended Ms. Yearick pursue genetic testing for the CancerNext-Expanded+RNAinsight gene panel. The CancerNext-Expanded gene panel offered by [facility] and includes sequencing and rearrangement analysis for the following 77 genes: AIP, ALK, APC*, ATM*, AXIN2, BAP1, BARD1, BLM, BMPR1A, BRCA1*, BRCA2*, BRIP1*, CDC73, CDH1*, CDK4, CDKN1B, CDKN2A, CHEK2*, CTNNA1, DICER1, FANCC, FH, FLCN, GALNT12, KIF1B, LZTR1, MAX, MEN1, MET, MLH1*, MSH2*, MSH3, MSH6*, MUTYH*, NBN, NF1*, NF2, NTHL1, PALB2*, PHOX2B, PMS2*, POT1, PRKAR1A, PTCH1, PTEN*, RAD51C*, RAD51D*, RB1, RECQL, RET, SDHA, SDHAF2, SDHB, SDHC, SDHD, SMAD4, SMARCA4, SMARCB1, SMARCE1, STK11, SUFU,  TMEM127, TP53*, TSC1, TSC2, VHL and XRCC2 (sequencing and deletion/duplication); EGFR, EGLN1, HOXB13, KIT, MITF, PDGFRA, POLD1, and POLE (sequencing only); EPCAM and GREM1 (deletion/duplication only). DNA and RNA analyses performed for * genes.   Based on Ms. Monette's personal and family history of cancer, she meets medical criteria for genetic testing. Despite that she meets criteria, she may still have an out of pocket cost. We discussed that if her out of pocket cost for testing is over $100, the laboratory will call and confirm whether she wants to proceed with testing.  If the out of pocket cost of testing is less than $100 she will be billed by the genetic testing laboratory.   PLAN: After considering the risks, benefits, and limitations, Ms. Alfredo provided informed consent to pursue genetic testing and the blood sample was sent to [facility] for analysis of the CancerNext-Expanded+RNAinsight. Results should be available within approximately 2-3 weeks' time, at which point they will be disclosed by telephone to Ms. Menor, as will any additional recommendations warranted by these results. Ms. Wires will receive a summary of her genetic counseling visit and a copy of her results once available. This information will also be available in Epic.   Lastly, we encouraged Ms. Spagna to remain in contact with cancer genetics annually so that we can continuously update the family history and inform her of any changes in cancer genetics and testing that may be of benefit for this family.   Ms. Lindquist questions were answered to her satisfaction today. Our contact information was provided should additional questions or concerns arise. Thank you for the referral and allowing [place] to share in the care of your patient.   Donya Hitch P. Florene Glen, [place], [facility] Licensed, Insurance risk surveyor [email or web address] phone: [phone]  The patient was seen for a total of 40 minutes  in face-to-face genetic counseling.  This patient was discussed with Drs. Magrinat, Lindi Adie and/or Burr Medico who agrees with the above.    _______________________________________________________________________ For Office Staff:  Number of people involved in session: 2 Was an Intern/ student involved with case: no

## 2020-08-12 ENCOUNTER — Other Ambulatory Visit: Payer: Self-pay

## 2020-08-12 ENCOUNTER — Ambulatory Visit
Admission: RE | Admit: 2020-08-12 | Discharge: 2020-08-12 | Disposition: A | Discharge status: Home / Self Care | Payer: 59 | Source: Ambulatory Visit | Attending: Radiation Oncology | Admitting: Radiation Oncology

## 2020-08-12 DIAGNOSIS — Z510 Encounter for antineoplastic radiation therapy: Secondary | ICD-10-CM | POA: Diagnosis not present

## 2020-08-13 ENCOUNTER — Ambulatory Visit
Admission: RE | Admit: 2020-08-13 | Discharge: 2020-08-13 | Disposition: A | Discharge status: Home / Self Care | Payer: 59 | Source: Ambulatory Visit | Attending: Radiation Oncology | Admitting: Radiation Oncology

## 2020-08-13 DIAGNOSIS — Z510 Encounter for antineoplastic radiation therapy: Secondary | ICD-10-CM | POA: Diagnosis not present

## 2020-08-14 ENCOUNTER — Ambulatory Visit
Admission: RE | Admit: 2020-08-14 | Discharge: 2020-08-14 | Disposition: A | Discharge status: Home / Self Care | Payer: 59 | Source: Ambulatory Visit | Attending: Radiation Oncology | Admitting: Radiation Oncology

## 2020-08-14 ENCOUNTER — Telehealth: Payer: Self-pay | Admitting: Family Medicine

## 2020-08-14 DIAGNOSIS — Z510 Encounter for antineoplastic radiation therapy: Secondary | ICD-10-CM | POA: Diagnosis not present

## 2020-08-14 NOTE — Telephone Encounter (Signed)
FYI:  Christy Case Manager w/[employer] is calling to just let us know that she is taking care pt needs for her insurance.

## 2020-08-15 ENCOUNTER — Other Ambulatory Visit: Payer: Self-pay

## 2020-08-15 ENCOUNTER — Ambulatory Visit
Admission: RE | Admit: 2020-08-15 | Discharge: 2020-08-15 | Disposition: A | Discharge status: Home / Self Care | Payer: 59 | Source: Ambulatory Visit | Attending: Radiation Oncology | Admitting: Radiation Oncology

## 2020-08-15 DIAGNOSIS — Z510 Encounter for antineoplastic radiation therapy: Secondary | ICD-10-CM | POA: Diagnosis not present

## 2020-08-18 ENCOUNTER — Ambulatory Visit
Admission: RE | Admit: 2020-08-18 | Discharge: 2020-08-18 | Disposition: A | Discharge status: Home / Self Care | Payer: 59 | Source: Ambulatory Visit | Attending: Radiation Oncology | Admitting: Radiation Oncology

## 2020-08-18 ENCOUNTER — Other Ambulatory Visit: Payer: Self-pay

## 2020-08-18 DIAGNOSIS — Z510 Encounter for antineoplastic radiation therapy: Secondary | ICD-10-CM | POA: Diagnosis not present

## 2020-08-19 ENCOUNTER — Ambulatory Visit
Admission: RE | Admit: 2020-08-19 | Discharge: 2020-08-19 | Disposition: A | Discharge status: Home / Self Care | Payer: 59 | Source: Ambulatory Visit | Attending: Radiation Oncology | Admitting: Radiation Oncology

## 2020-08-19 DIAGNOSIS — Z510 Encounter for antineoplastic radiation therapy: Secondary | ICD-10-CM | POA: Diagnosis not present

## 2020-08-20 ENCOUNTER — Telehealth: Payer: Self-pay | Admitting: Genetic Counselor

## 2020-08-20 ENCOUNTER — Encounter: Payer: Self-pay | Admitting: Genetic Counselor

## 2020-08-20 ENCOUNTER — Ambulatory Visit
Admission: RE | Admit: 2020-08-20 | Discharge: 2020-08-20 | Disposition: A | Discharge status: Home / Self Care | Payer: 59 | Source: Ambulatory Visit | Attending: Radiation Oncology | Admitting: Radiation Oncology

## 2020-08-20 ENCOUNTER — Other Ambulatory Visit: Payer: Self-pay

## 2020-08-20 DIAGNOSIS — Z1379 Encounter for other screening for genetic and chromosomal anomalies: Secondary | ICD-10-CM | POA: Insufficient documentation

## 2020-08-20 DIAGNOSIS — Z510 Encounter for antineoplastic radiation therapy: Secondary | ICD-10-CM | POA: Diagnosis not present

## 2020-08-20 NOTE — Telephone Encounter (Signed)
Revealed negative genetic testing.  Discussed that we do not know why she has breast cancer or why there is cancer in the family. It could be due to a different gene that we are not testing, or maybe our current technology may not be able to pick something up.  It will be important for her to keep in contact with genetics to keep up with whether additional testing may be needed.   One VUS identified.  This will not change medical management.   

## 2020-08-21 ENCOUNTER — Ambulatory Visit: Payer: Self-pay | Admitting: Genetic Counselor

## 2020-08-21 ENCOUNTER — Ambulatory Visit
Admission: RE | Admit: 2020-08-21 | Discharge: 2020-08-21 | Disposition: A | Discharge status: Home / Self Care | Payer: 59 | Source: Ambulatory Visit | Attending: Radiation Oncology | Admitting: Radiation Oncology

## 2020-08-21 DIAGNOSIS — D0511 Intraductal carcinoma in situ of right breast: Secondary | ICD-10-CM

## 2020-08-21 DIAGNOSIS — Z1379 Encounter for other screening for genetic and chromosomal anomalies: Secondary | ICD-10-CM

## 2020-08-21 DIAGNOSIS — Z510 Encounter for antineoplastic radiation therapy: Secondary | ICD-10-CM | POA: Diagnosis not present

## 2020-08-21 NOTE — Progress Notes (Signed)
HPI:  Sue Jackson was previously seen in the [facility] due to a personal and family history of breast cancer and concerns regarding a hereditary predisposition to cancer. Please refer to our prior cancer genetics clinic note for more information regarding our discussion, assessment and recommendations, at the time. Sue Jackson recent genetic test results were disclosed to her, as were recommendations warranted by these results. These results and recommendations are discussed in more detail below.  CANCER HISTORY:  Oncology History Overview Note  Cancer Staging No matching staging information was found for the patient.    Ductal carcinoma in situ (DCIS) of right breast  04/01/2020 Mammogram   IMPRESSION: 1. Indeterminate 51mm right breast calcifications. 2. Probably benign left breast masses x2, one of which may correspond with a complicated cyst at the 4 o'clock retroareolar Position measures 7 x 5 x 2 mm.   04/09/2020 Initial Biopsy   Diagnosis Breast, right, needle core biopsy, upper outer - DUCTAL CARCINOMA IN SITU, HIGH-GRADE WITH FOCAL NECROSIS AND CALCIFICATIONS. SEE NOTE Diagnosis Note DCIS measures 0.2 cm in greatest linear dimension. A breast prognostic profile (ER, PR) is pending and will be reported in an addendum. Dr. Luisa Hart reviewed the case and concurs with the diagnosis. [facility] was notified on 04/10/2020.     PROGNOSTIC INDICATORS Results: IMMUNOHISTOCHEMICAL AND MORPHOMETRIC ANALYSIS PERFORMED MANUALLY Estrogen Receptor: 90%, POSITIVE, MODERATE STAINING INTENSITY Progesterone Receptor: 80%, POSITIVE, STRONG STAINING INTENSITY   04/11/2020 Initial Diagnosis   Ductal carcinoma in situ (DCIS) of right breast   08/19/2020 Genetic Testing   Negative genetic testing.  KIF1B VUS identified.  The CancerNext-Expanded gene panel offered by [facility] and includes sequencing and rearrangement analysis for the  following 77 genes: AIP, ALK, APC*, ATM*, AXIN2, BAP1, BARD1, BLM, BMPR1A, BRCA1*, BRCA2*, BRIP1*, CDC73, CDH1*, CDK4, CDKN1B, CDKN2A, CHEK2*, CTNNA1, DICER1, FANCC, FH, FLCN, GALNT12, KIF1B, LZTR1, MAX, MEN1, MET, MLH1*, MSH2*, MSH3, MSH6*, MUTYH*, NBN, NF1*, NF2, NTHL1, PALB2*, PHOX2B, PMS2*, POT1, PRKAR1A, PTCH1, PTEN*, RAD51C*, RAD51D*, RB1, RECQL, RET, SDHA, SDHAF2, SDHB, SDHC, SDHD, SMAD4, SMARCA4, SMARCB1, SMARCE1, STK11, SUFU, TMEM127, TP53*, TSC1, TSC2, VHL and XRCC2 (sequencing and deletion/duplication); EGFR, EGLN1, HOXB13, KIT, MITF, PDGFRA, POLD1, and POLE (sequencing only); EPCAM and GREM1 (deletion/duplication only). DNA and RNA analyses performed for * genes. The report date is August 19, 2020.     FAMILY HISTORY:  We obtained a detailed, 4-generation family history.  Significant diagnoses are listed below: Family History  Problem Relation Age of Onset  . Arthritis Mother   . Asthma Mother   . Cirrhosis Father        d. 58  . Depression Maternal Aunt   . Hypertension Maternal Aunt   . Breast cancer Maternal Aunt 51  . Diabetes Maternal Grandfather   . Heart disease Maternal Grandfather   . Hypertension Maternal Grandfather   . Stroke Maternal Grandfather   . Breast cancer Maternal Aunt 59  . Breast cancer Paternal Aunt   . Breast cancer Cousin        pat first cousin    The patient does not have children.  She has two sisters who are cancer free.  Her father is deceased and her mother is living.  Her father died at 52 from liver cirrhosis.  He had a sister and two brothers, the sister had breast cancer, and had a daughter with breast cancer.  The patient is not close to this side of the family and does not  know much other information.  The patient's mother is in her late 75's.  She has two sisters and two brothers.  Both sister had breast cancer in their 66's.  The maternal grandparents are deceased.  Sue Jackson is unaware of previous family history of genetic  testing for hereditary cancer risks. Patient's maternal ancestors are of [place] descent, and paternal ancestors are of [place] descent. There is no reported Ashkenazi Jewish ancestry. There is no known consanguinity.    GENETIC TEST RESULTS: Genetic testing reported out on August 19, 2020 through the CancerNext-Expanded+RNAinsight cancer panel found no pathogenic mutations. The CancerNext-Expanded gene panel offered by [facility] and includes sequencing and rearrangement analysis for the following 77 genes: AIP, ALK, APC*, ATM*, AXIN2, BAP1, BARD1, BLM, BMPR1A, BRCA1*, BRCA2*, BRIP1*, CDC73, CDH1*, CDK4, CDKN1B, CDKN2A, CHEK2*, CTNNA1, DICER1, FANCC, FH, FLCN, GALNT12, KIF1B, LZTR1, MAX, MEN1, MET, MLH1*, MSH2*, MSH3, MSH6*, MUTYH*, NBN, NF1*, NF2, NTHL1, PALB2*, PHOX2B, PMS2*, POT1, PRKAR1A, PTCH1, PTEN*, RAD51C*, RAD51D*, RB1, RECQL, RET, SDHA, SDHAF2, SDHB, SDHC, SDHD, SMAD4, SMARCA4, SMARCB1, SMARCE1, STK11, SUFU, TMEM127, TP53*, TSC1, TSC2, VHL and XRCC2 (sequencing and deletion/duplication); EGFR, EGLN1, HOXB13, KIT, MITF, PDGFRA, POLD1, and POLE (sequencing only); EPCAM and GREM1 (deletion/duplication only). DNA and RNA analyses performed for * genes. The test report has been scanned into EPIC and is located under the Molecular Pathology section of the Results Review tab.  A portion of the result report is included below for reference.     We discussed with Sue Jackson that because current genetic testing is not perfect, it is possible there may be a gene mutation in one of these genes that current testing cannot detect, but that chance is small.  We also discussed, that there could be another gene that has not yet been discovered, or that we have not yet tested, that is responsible for the cancer diagnoses in the family. It is also possible there is a hereditary cause for the cancer in the family that Sue Jackson did not inherit and therefore was not identified in her testing.   Therefore, it is important to remain in touch with cancer genetics in the future so that we can continue to offer Sue Jackson the most up to date genetic testing.   Genetic testing did identify a variant of uncertain significance (VUS) was identified in the KIF1B gene called [id number].  At this time, it is unknown if this variant is associated with increased cancer risk or if this is a normal finding, but most variants such as this get reclassified to being inconsequential. It should not be used to make medical management decisions. With time, we suspect the lab will determine the significance of this variant, if any. If we do learn more about it, we will try to contact Sue Jackson to discuss it further. However, it is important to stay in touch with [place] periodically and keep the address and phone number up to date.  ADDITIONAL GENETIC TESTING: We discussed with Sue Jackson that her genetic testing was fairly extensive.  If there are genes identified to increase cancer risk that can be analyzed in the future, we would be happy to discuss and coordinate this testing at that time.    CANCER SCREENING RECOMMENDATIONS: Sue Jackson test result is considered negative (normal).  This means that we have not identified a hereditary cause for her personal and family history of breast cancer at this time. Most cancers happen by chance and this negative test suggests that her cancer  may fall into this category.    While reassuring, this does not definitively rule out a hereditary predisposition to cancer. It is still possible that there could be genetic mutations that are undetectable by current technology. There could be genetic mutations in genes that have not been tested or identified to increase cancer risk.  Therefore, it is recommended she continue to follow the cancer management and screening guidelines provided by her oncology and primary healthcare provider.   An individual's cancer risk and medical  management are not determined by genetic test results alone. Overall cancer risk assessment incorporates additional factors, including personal medical history, family history, and any available genetic information that may result in a personalized plan for cancer prevention and surveillance  RECOMMENDATIONS FOR FAMILY MEMBERS:  Individuals in this family might be at some increased risk of developing cancer, over the general population risk, simply due to the family history of cancer.  We recommended women in this family have a yearly mammogram beginning at age 61, or 84 years younger than the earliest onset of cancer, an annual clinical breast exam, and perform monthly breast self-exams. Women in this family should also have a gynecological exam as recommended by their primary provider. All family members should be referred for colonoscopy starting at age 84.  FOLLOW-UP: Lastly, we discussed with Sue Jackson that cancer genetics is a rapidly advancing field and it is possible that new genetic tests will be appropriate for her and/or her family members in the future. We encouraged her to remain in contact with cancer genetics on an annual basis so we can update her personal and family histories and let her know of advances in cancer genetics that may benefit this family.   Our contact number was provided. Sue Jackson questions were answered to her satisfaction, and she knows she is welcome to call us at anytime with additional questions or concerns.   Roma Kayser, [place], [facility] Licensed, Certified Genetic Counselor [email or web address]

## 2020-08-22 ENCOUNTER — Ambulatory Visit
Admission: RE | Admit: 2020-08-22 | Discharge: 2020-08-22 | Disposition: A | Discharge status: Home / Self Care | Payer: 59 | Source: Ambulatory Visit | Attending: Radiation Oncology | Admitting: Radiation Oncology

## 2020-08-22 ENCOUNTER — Encounter: Payer: Self-pay | Admitting: Radiation Oncology

## 2020-08-22 DIAGNOSIS — Z510 Encounter for antineoplastic radiation therapy: Secondary | ICD-10-CM | POA: Diagnosis not present

## 2020-08-25 ENCOUNTER — Ambulatory Visit: Payer: 59

## 2020-08-26 ENCOUNTER — Ambulatory Visit
Admission: RE | Admit: 2020-08-26 | Discharge: 2020-08-26 | Disposition: A | Discharge status: Home / Self Care | Payer: 59 | Source: Ambulatory Visit | Attending: Radiation Oncology | Admitting: Radiation Oncology

## 2020-08-26 DIAGNOSIS — Z510 Encounter for antineoplastic radiation therapy: Secondary | ICD-10-CM | POA: Diagnosis not present

## 2020-08-27 ENCOUNTER — Ambulatory Visit
Admission: RE | Admit: 2020-08-27 | Discharge: 2020-08-27 | Disposition: A | Discharge status: Home / Self Care | Payer: 59 | Source: Ambulatory Visit | Attending: Radiation Oncology | Admitting: Radiation Oncology

## 2020-08-27 DIAGNOSIS — Z510 Encounter for antineoplastic radiation therapy: Secondary | ICD-10-CM | POA: Diagnosis not present

## 2020-08-27 NOTE — Progress Notes (Signed)
  Radiation Oncology         [phone]) [phone] ________________________________  Name: Maham Quintin MRN: [id number]  Date: 08/22/2020  DOB: 1980-09-18  SIMULATION NOTE   NARRATIVE:  The patient underwent simulation today for ongoing radiation therapy.  The existing CT study set was employed for the purpose of virtual treatment planning.  The target and avoidance structures were reviewed and modified as necessary.  Treatment planning then occurred.  The radiation boost prescription was entered and confirmed.  A total of 1 complex treatment devices were fabricated in the form of multi-leaf collimators to shape radiation around the targets while maximally excluding nearby normal structures. I have requested : en face electron plan/ [place] isodose plan.    PLAN:  This modified radiation beam arrangement is intended to continue the current radiation dose to an additional 10 Gy in 5 fractions for a total cumulative dose of 50.4 Gy.    ------------------------------------------------  Jodelle Gross, MD, PhD

## 2020-08-28 ENCOUNTER — Encounter: Payer: Self-pay | Admitting: *Deleted

## 2020-08-28 ENCOUNTER — Ambulatory Visit
Admission: RE | Admit: 2020-08-28 | Discharge: 2020-08-28 | Disposition: A | Discharge status: Home / Self Care | Payer: 59 | Source: Ambulatory Visit | Attending: Radiation Oncology | Admitting: Radiation Oncology

## 2020-08-28 DIAGNOSIS — Z510 Encounter for antineoplastic radiation therapy: Secondary | ICD-10-CM | POA: Diagnosis not present

## 2020-08-29 ENCOUNTER — Ambulatory Visit
Admission: RE | Admit: 2020-08-29 | Discharge: 2020-08-29 | Disposition: A | Discharge status: Home / Self Care | Payer: 59 | Source: Ambulatory Visit | Attending: Radiation Oncology | Admitting: Radiation Oncology

## 2020-08-29 ENCOUNTER — Other Ambulatory Visit: Payer: Self-pay

## 2020-08-29 DIAGNOSIS — Z510 Encounter for antineoplastic radiation therapy: Secondary | ICD-10-CM | POA: Diagnosis not present

## 2020-09-01 ENCOUNTER — Ambulatory Visit
Admission: RE | Admit: 2020-09-01 | Discharge: 2020-09-01 | Disposition: A | Discharge status: Home / Self Care | Payer: 59 | Source: Ambulatory Visit | Attending: Radiation Oncology | Admitting: Radiation Oncology

## 2020-09-01 DIAGNOSIS — Z510 Encounter for antineoplastic radiation therapy: Secondary | ICD-10-CM | POA: Diagnosis not present

## 2020-09-01 NOTE — Progress Notes (Signed)
[facility]   [phone] [phone]   Clinic Follow up Note   Patient Care Team: Jordan, Betty G, MD as PCP - General (Family Medicine) Martini, Keisha N, RN as Oncology Nurse Navigator Stuart, Dawn C, RN as Oncology Nurse Navigator , , MD as Consulting Physician (Hematology) Blackman, Douglas, MD as Consulting Physician (General Surgery) Moody, John, MD as Consulting Physician (Radiation Oncology)  Date of Service:  09/04/2020  CHIEF COMPLAINT: F/u of right breast DCIS   SUMMARY OF ONCOLOGIC HISTORY: Oncology History Overview Note  Cancer Staging Ductal carcinoma in situ (DCIS) of right breast Staging form: Breast, AJCC 8th Edition - Clinical stage from 04/16/2020: Stage 0 (cTis (DCIS), cN0, cM0, ER+, PR+, HER2: Not Assessed) - Unsigned    Ductal carcinoma in situ (DCIS) of right breast  04/01/2020 Mammogram   IMPRESSION: 1. Indeterminate 9mm right breast calcifications. 2. Probably benign left breast masses x2, one of which may correspond with a complicated cyst at the 4 o'clock retroareolar Position measures 7 x 5 x 2 mm.   04/09/2020 Initial Biopsy   Diagnosis Breast, right, needle core biopsy, upper outer - DUCTAL CARCINOMA IN SITU, HIGH-GRADE WITH FOCAL NECROSIS AND CALCIFICATIONS. SEE NOTE Diagnosis Note DCIS measures 0.2 cm in greatest linear dimension. A breast prognostic profile (ER, PR) is pending and will be reported in an addendum. Dr. Patrick reviewed the case and concurs with the diagnosis. [facility] was notified on 04/10/2020.     PROGNOSTIC INDICATORS Results: IMMUNOHISTOCHEMICAL AND MORPHOMETRIC ANALYSIS PERFORMED MANUALLY Estrogen Receptor: 90%, POSITIVE, MODERATE STAINING INTENSITY Progesterone Receptor: 80%, POSITIVE, STRONG STAINING INTENSITY   04/11/2020 Initial Diagnosis   Ductal carcinoma in situ (DCIS) of right breast   04/22/2020 Breast MRI   IMPRESSION: 1. Expected post  biopsy changes in the anterior UPPER OUTER QUADRANT of the RIGHT breast. 2. Indeterminate irregular mass in the posterior UPPER INNER QUADRANT of the LEFT breast for which MR guided core biopsy is recommended. (Image 36 of series 8)     04/24/2020 Pathology Results   Diagnosis 1. Breast, left, needle core biopsy, upper inner - FIBROADENOMATOID CHANGE. - NO EVIDENCE OF MALIGNANCY. 2. Breast, left, needle core biopsy, lower inner - FIBROCYSTIC CHANGE WITH APOCRINE METAPLASIA. - NO EVIDENCE OF MALIGNANCY. Microscopic Comment 1. -2. Called to the [facility] on 04/25/20).   05/12/2020 Breast MRI   Left Breast MRI  IMPRESSION: 1. Biopsy clip placed at earlier stereotactic biopsy which revealed benign fibroadenoma corresponds to the mass identified within the upper LEFT breast on earlier MRI. As such, no additional MRI-guided biopsy is necessary today. 2. Patient has a known RIGHT breast cancer.   06/12/2020 Surgery   RIGHT BREAST LUMPECTOMY WITH RADIOACTIVE SEED LOCALIZATION by Dr Blackman     06/12/2020 Pathology Results   FINAL MICROSCOPIC DIAGNOSIS:   A. BREAST, RIGHT, LUMPECTOMY:  - Ductal carcinoma in situ, 0.9 cm.  - Margins not involved.  - DCIS focally 0.3 cm from anterior margin.  - Biopsy site and biopsy clip.    07/23/2020 - 09/11/2020 Radiation Therapy   Adjuvant Radiation with Dr Moody    08/19/2020 Genetic Testing   Negative genetic testing.  KIF1B VUS identified.  The CancerNext-Expanded gene panel offered by [employer] [employer] and includes sequencing and rearrangement analysis for the following 77 genes: AIP, ALK, APC*, ATM*, AXIN2, BAP1, BARD1, BLM, BMPR1A, BRCA1*, BRCA2*, BRIP1*, CDC73, CDH1*, CDK4, CDKN1B, CDKN2A, CHEK2*, CTNNA1, DICER1, FANCC, FH, FLCN, GALNT12, KIF1B, LZTR1, MAX, MEN1,   MET, MLH1*, MSH2*, MSH3, MSH6*, MUTYH*, NBN, NF1*, NF2, NTHL1, PALB2*, PHOX2B, PMS2*, POT1, PRKAR1A, PTCH1, PTEN*, RAD51C*, RAD51D*, RB1, RECQL, RET, SDHA, SDHAF2, SDHB,  SDHC, SDHD, SMAD4, SMARCA4, SMARCB1, SMARCE1, STK11, SUFU, TMEM127, TP53*, TSC1, TSC2, VHL and XRCC2 (sequencing and deletion/duplication); EGFR, EGLN1, HOXB13, KIT, MITF, PDGFRA, POLD1, and POLE (sequencing only); EPCAM and GREM1 (deletion/duplication only). DNA and RNA analyses performed for * genes. The report date is August 19, 2020.      CURRENT THERAPY:  Adjuvant Radiation with Dr Moody 07/23/20-09/11/20  INTERVAL HISTORY:  Sue Jackson is here for a follow up. She presents to the clinic alone. She has been tolerating radiation well overall, and is scheduled to complete next week. Skin irirtation from radiation, no significant bleeding or discharge, or pain. Mild to moderate fatigue from radiation, able to function well at home.   All other systems were reviewed with the patient and are negative.  MEDICAL HISTORY:  Past Medical History:  Diagnosis Date  . Anemia 2019-2020  . Anxiety   . Arthritis   . Bipolar disorder (HCC) 2013  . Cancer (HCC) 2021  . Depression   . Family history of breast cancer   . Headache 2021  . Hypertension   . Pre-diabetes     SURGICAL HISTORY: Past Surgical History:  Procedure Laterality Date  . BREAST LUMPECTOMY WITH RADIOACTIVE SEED LOCALIZATION Right 06/12/2020   Procedure: RIGHT BREAST LUMPECTOMY WITH RADIOACTIVE SEED LOCALIZATION;  Surgeon: Blackman, Douglas, MD;  Location: [facility] OR;  Service: General;  Laterality: Right;  LMA    I have reviewed the social history and family history with the patient and they are unchanged from previous note.  ALLERGIES:  has No Known Allergies.  MEDICATIONS:  Current Outpatient Medications  Medication Sig Dispense Refill  . amLODipine-benazepril (LOTREL) 5-10 MG capsule TAKE 1 CAPSULE BY MOUTH EVERY DAY (Patient taking differently: Take 1 capsule by mouth daily. ) 90 capsule 2  . clonazePAM (KLONOPIN) 0.5 MG tablet Take 1 tablet (0.5 mg total) by mouth 2 (two) times daily. 60 tablet 1  .  meclizine (ANTIVERT) 25 MG tablet Take 1 tablet (25 mg total) by mouth 3 (three) times daily as needed for dizziness. 9045 tablet 1  . propranolol (INDERAL) 40 MG tablet Take 0.5 tablets (20 mg total) by mouth daily. 90 tablet 2   No current facility-administered medications for this visit.    PHYSICAL EXAMINATION: ECOG PERFORMANCE STATUS: 1 - Symptomatic but completely ambulatory  Vitals:   09/04/20 1112  BP: 132/76  Pulse: 73  Resp: 16  Temp: 97.6 F (36.4 C)  SpO2: 100%   Filed Weights   09/04/20 1112  Weight: 249 lb 4.8 oz (113.1 kg)    GENERAL:alert, no distress and comfortable SKIN: skin color, texture, turgor are normal, no rashes or significant lesions EYES: normal, Conjunctiva are pink and non-injected, sclera clear NECK: supple, thyroid normal size, non-tender, without nodularity LYMPH:  no palpable lymphadenopathy in the cervical, axillary  LUNGS: clear to auscultation and percussion with normal breathing effort HEART: regular rate & rhythm and no murmurs and no lower extremity edema ABDOMEN:abdomen soft, non-tender and normal bowel sounds Musculoskeletal:no cyanosis of digits and no clubbing  NEURO: alert & oriented x 3 with fluent speech, no focal motor/sensory deficits Breasts: Breast inspection showed them to be symmetrical with no nipple discharge. Diffuse skin erythema and mild skin peeling in the right breast and axilla, no ulcers or discharge. Palpation of the left breast and axilla revealed no obvious mass   that I could appreciate.   LABORATORY DATA:  I have reviewed the data as listed CBC Latest Ref Rng & Units 06/05/2020 04/16/2020 09/22/2019  WBC 4.0 - 10.5 K/uL 11.1(H) 9.3 12.2(H)  Hemoglobin 12.0 - 15.0 g/dL 14.6 14.0 14.1  Hematocrit 36.0 - 46.0 % 45.1 43.5 43.6  Platelets 150 - 400 K/uL 402(H) 309 338     CMP Latest Ref Rng & Units 06/05/2020 04/16/2020 01/30/2020  Glucose 70 - 99 mg/dL 106(H) 110(H) 115(H)  BUN 6 - 20 mg/dL _0 Creatinine  0.44 - 1.00 mg/dL 0.77 1.11(H) 0.85  Sodium 135 - 145 mmol/L 134(L) 138 134(L)  Potassium 3.5 - 5.1 mmol/L 4.2 4.2 4.9  Chloride 98 - 111 mmol/L 104 105 104  CO2 22 - 32 mmol/L 19(L) 25 27  Calcium 8.9 - 10.3 mg/dL 9.3 8.9 9.0  Total Protein 6.5 - 8.1 g/dL - 7.3 6.7  Total Bilirubin 0.3 - 1.2 mg/dL - 0.3 0.3  Alkaline Phos 38 - 126 U/L - 75 72  AST 15 - 41 U/L - 14(L) 15  ALT 0 - 44 U/L - 27 25      RADIOGRAPHIC STUDIES: I have personally reviewed the radiological images as listed and agreed with the findings in the report. No results found.   ASSESSMENT & PLAN:  Sue Jackson is a 40 y.o. female with   1. Right breast DCIS, High grade, ER+/PR+  -She was diagnosed in 04/2020 with high grade DCIS in her right breast with necrosis and calcifications. Left breast biopsy was benign.  -She underwent right breast lumpectomy with Dr Ninfa Linden on 06/12/20.  -Her DCIS was cured by complete surgical resection. Any form of adjuvant therapy is preventive. -She proceeded with adjuvant Radiation with Dr Lisbeth Renshaw on 07/23/20. She plans to complete on 09/11/20. She is tolerating moderately well -Given her strongly positive ER and PR and premenopausal status, I do recommend antiestrogen therapy with Tamoxifen for 5 years, which decrease her risk of future breast cancer by ~40%.              -The potential side effects, which includes but not limited to, hot flash, skin and vaginal dryness, slightly increased risk of cardiovascular disease and cataract, small risk of thrombosis and endometrial cancer, were discussed with her in great details. -she has done some research about Tamoxifen and is very concerned about side effects, especially thrombosis and the risk of endometrial cancer.  After lengthy discussion, she declined tamoxifen. -We discussed the breast cancer surveillance after her surgery. She will continue annual screening mammogram, self exams, and breast exam with physican. I discussed the  option of additional screening with annual breast MRIs. She is interested. Will start in Feb 2023 -f/u once in one year, okay to follow-up with Dr. Ninfa Linden and her PCP only from next year  -Next mammogram in August 2022   2. Genetic Testing was negative for pathogenetic testing    3. Anxiety/Depression -She notes to having chronic Anxiety and Depression along with bipolar. She did not tolerate Zoloft or Effexor in the past.  -She is on Ativan and half tablet Propranolol. Medication is managed by her PCP. She is not seeing a therapist or Psychiatrist.    4. HTN, Arthritis in back -Managed by PCP. Continue medications.    PLAN:  -She will complete radiation next week -She declined tamoxifen -Continue breast cancer surveillance, diagnostic mammogram in August 2022 -Follow-up in 1 year, will order breast MRI on next visit  No problem-specific Assessment & Plan notes found for this encounter.   Orders Placed This Encounter  Procedures  . MM DIAG BREAST TOMO BILATERAL    Standing Status:   Future    Standing Expiration Date:   09/04/2021    Order Specific Question:   Reason for Exam (SYMPTOM  OR DIAGNOSIS REQUIRED)    Answer:   screening    Order Specific Question:   Is the patient pregnant?    Answer:   No    Order Specific Question:   Preferred imaging location?    [facility]   All questions were answered. The patient knows to call the clinic with any problems, questions or concerns. No barriers to learning was detected. The total time spent in the appointment was 30 minutes.      , MD 09/04/2020   I, Amoya Bennett, am acting as scribe for  , MD.   I have reviewed the above documentation for accuracy and completeness, and I agree with the above.       

## 2020-09-02 ENCOUNTER — Telehealth (INDEPENDENT_AMBULATORY_CARE_PROVIDER_SITE_OTHER): Payer: 59 | Admitting: Psychiatry

## 2020-09-02 ENCOUNTER — Ambulatory Visit
Admission: RE | Admit: 2020-09-02 | Discharge: 2020-09-02 | Disposition: A | Discharge status: Home / Self Care | Payer: 59 | Source: Ambulatory Visit | Attending: Radiation Oncology | Admitting: Radiation Oncology

## 2020-09-02 ENCOUNTER — Encounter (HOSPITAL_COMMUNITY): Payer: Self-pay | Admitting: Psychiatry

## 2020-09-02 ENCOUNTER — Other Ambulatory Visit: Payer: Self-pay

## 2020-09-02 ENCOUNTER — Telehealth (HOSPITAL_COMMUNITY): Payer: Self-pay

## 2020-09-02 DIAGNOSIS — F3181 Bipolar II disorder: Secondary | ICD-10-CM

## 2020-09-02 DIAGNOSIS — Z510 Encounter for antineoplastic radiation therapy: Secondary | ICD-10-CM | POA: Diagnosis not present

## 2020-09-02 DIAGNOSIS — F419 Anxiety disorder, unspecified: Secondary | ICD-10-CM | POA: Diagnosis not present

## 2020-09-02 MED ORDER — CLONAZEPAM 0.5 MG PO TABS: 0.5000 mg | ORAL_TABLET | Freq: Two times a day (BID) | ORAL | 1 refills | Status: DC

## 2020-09-02 NOTE — Telephone Encounter (Signed)
Error

## 2020-09-02 NOTE — Progress Notes (Signed)
[facility] Progress Note  Virtual Visit via Video Note  I connected with Sue Jackson on 09/02/20 at  1:40 PM EST by a video enabled telemedicine application and verified that I am speaking with the correct person using two identifiers.  Location: Patient: Home Provider: Clinic   I discussed the limitations of evaluation and management by telemedicine and the availability of in person appointments. The patient expressed understanding and agreed to proceed.  I provided 16 minutes of non-face-to-face time during this encounter.    Patient Identification: Special Ranes MRN:  [id number] Date of Evaluation:  09/02/2020    Chief Complaint:   " I am okay right now."  Visit Diagnosis:    ICD-10-CM   1. History of Bipolar 2 disorder ([place]), now in remission  F31.81   2. Anxiety  F41.9     History of Present Illness: Patient called a few weeks ago and reported the lorazepam was making her quite sleepy and asked for another option, patient was prescribed clonazepam 0.5 mg twice daily at that time.  Patient reported that she is doing well now. She is receiving radiation therapy. She underwent right breast lumpectomy in November and was diagnosed with ductal carcinoma in situ of right breast. She stated that she returned back to work a few days ago and will take a day off here and there when she feels work discomfort and fatigue. She stated that she is gradually getting back on her feet however she does worry about the future. She stated that they are recommending that she takes estrogen blockers for the next 5 years however all the literature she has looked at is indicating increased risk of various complications including high risk of blood clots etc. She stated she has not made up her mind about it yet. She stated that clonazepam helps her with her anxiety immensely and she would like to continue taking it. She complained that it seems like the pharmacy made a mistake as she  only has 1 tablet left and is not even 30 days yet she is not sure why she is running short because she only takes it twice a day. She requested for the writer to send the prescription to a different [facility] pharmacy and requested for permission to be able to fill the prescription today.  She denied feeling depressed or hopeless and wants to stay optimistic with all the stress.   Past Psychiatric History: Bipolar depression, anxiety.  History of 1 prior psychiatry hospitalization in 2013 after overdosing on Effexor.  Previous Psychotropic Medications: Yes , Effexor-overdosed, did not like the side effects, Prozac-did not help much, Zoloft-made her feel "crazy", lithium-help with mood but did not like the way it made her feel, Seroquel-cannot recall if she took it regularly.  Substance Abuse History in the last 12 months:  No.  Consequences of Substance Abuse: NA  Past Medical History:  Past Medical History:  Diagnosis Date  . Anemia 2019-2020  . Anxiety   . Arthritis   . Bipolar disorder ([place]) 2013  . Cancer ([place]) 2021  . Depression   . Family history of breast cancer   . Headache 2021  . Hypertension   . Pre-diabetes     Past Surgical History:  Procedure Laterality Date  . BREAST LUMPECTOMY WITH RADIOACTIVE SEED LOCALIZATION Right 06/12/2020   Procedure: RIGHT BREAST LUMPECTOMY WITH RADIOACTIVE SEED LOCALIZATION;  Surgeon: Coralie Keens, MD;  Location: [facility];  Service: General;  Laterality: Right;  LMA  Family Psychiatric History: Maternal aunt- depression  Family History:  Family History  Problem Relation Age of Onset  . Arthritis Mother   . Asthma Mother   . Cirrhosis Father        d. 34  . Depression Maternal Aunt   . Hypertension Maternal Aunt   . Breast cancer Maternal Aunt 51  . Diabetes Maternal Grandfather   . Heart disease Maternal Grandfather   . Hypertension Maternal Grandfather   . Stroke Maternal Grandfather   . Breast cancer Maternal Aunt 9  .  Breast cancer Paternal Aunt   . Breast cancer Cousin        pat first cousin    Social History:   Social History   Socioeconomic History  . Marital status: Married    Spouse name: Not on file  . Number of children: 0  . Years of education: Not on file  . Highest education level: Not on file  Occupational History  . Not on file  Tobacco Use  . Smoking status: Former Research scientist (life sciences)  . Smokeless tobacco: Never Used  . Tobacco comment: social smoker  Vaping Use  . Vaping Use: Never used  Substance and Sexual Activity  . Alcohol use: Yes    Comment: occasionally  . Drug use: Yes    Types: Marijuana  . Sexual activity: Yes    Birth control/protection: None  Other Topics Concern  . Not on file  Social History Narrative  . Not on file   Social Determinants of Health   Financial Resource Strain: Low Risk   . Difficulty of Paying Living Expenses: Not very hard  Food Insecurity: No Food Insecurity  . Worried About Charity fundraiser in the Last Year: Never true  . Ran Out of Food in the Last Year: Never true  Transportation Needs: No Transportation Needs  . Lack of Transportation (Medical): No  . 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  Lives with her partner/wife.  Does not have any children.  Allergies:  No Known Allergies  Metabolic Disorder Labs: Lab Results  Component Value Date   HGBA1C 6.4 01/30/2020   No results found for: PROLACTIN Lab Results  Component Value Date   CHOL 134 01/30/2020   TRIG 62.0 01/30/2020   HDL 45.50 01/30/2020   CHOLHDL 3 01/30/2020   VLDL 12.4 01/30/2020   LDLCALC 76 01/30/2020   LDLCALC 95 03/11/2017   Lab Results  Component Value Date   TSH 2.51 11/21/2018    Therapeutic Level Labs: No results found for: LITHIUM No results found for: CBMZ No results  found for: VALPROATE  Current Medications: Current Outpatient Medications  Medication Sig Dispense Refill  . amLODipine-benazepril (LOTREL) 5-10 MG capsule TAKE 1 CAPSULE BY MOUTH EVERY DAY (Patient taking differently: Take 1 capsule by mouth daily. ) 90 capsule 2  . clonazePAM (KLONOPIN) 0.5 MG tablet Take 1 tablet (0.5 mg total) by mouth 2 (two) times daily. 60 tablet 1  . meclizine (ANTIVERT) 25 MG tablet Take 1 tablet (25 mg total) by mouth 3 (three) times daily as needed for dizziness. 9045 tablet 1  . oxyCODONE (OXY IR/ROXICODONE) 5 MG immediate release tablet Take 1 tablet (5 mg total) by mouth every 6 (six) hours as needed for moderate pain or severe pain. 25 tablet 0  .  propranolol (INDERAL) 40 MG tablet Take 0.5 tablets (20 mg total) by mouth daily. 90 tablet 2  . topiramate (TOPAMAX) 25 MG tablet Take 50 mg by mouth at bedtime.  (Patient not taking: Reported on 07/14/2020)     No current facility-administered medications for this visit.    Musculoskeletal: Strength & Muscle Tone: unable to assess due to telemed visit Gait & Station: unable to assess due to telemed visit Patient leans: unable to assess due to telemed visit   Psychiatric Specialty Exam: Review of Systems  There were no vitals taken for this visit.There is no height or weight on file to calculate BMI.  General Appearance: Fairly Groomed  Eye Contact:  Good  Speech:  Clear and Coherent and Normal Rate  Volume:  Normal  Mood:  Euthymic  Affect:  Congruent  Thought Process:  Goal Directed and Descriptions of Associations: Intact  Orientation:  Full (Time, Place, and Person)  Thought Content:  Logical  Suicidal Thoughts:  No  Homicidal Thoughts:  No  Memory:  Immediate;   Good Recent;   Good Remote;   Good  Judgement:  Fair  Insight:  Fair  Psychomotor Activity:  Normal  Concentration:  Concentration: Good and Attention Span: Good  Recall:  Good  Fund of Knowledge:Good  Language: Good  Akathisia:   Negative  Handed:  Right  AIMS (if indicated):  not done  Assets:  Communication Skills Desire for Improvement Financial Resources/Insurance Housing Social Support Vocational/Educational  ADL's:  Intact  Cognition: WNL  Sleep:  Good   PHQ2-9   Flowsheet Row Oncology Navigator from 04/16/2020 in [facility]  PHQ-2 Total Score 1     Assessment and Plan: Patient is undergoing treatment for ductal carcinoma in situ of right breast, underwent surgical procedure and currently is about to complete her radiation oncology sessions. She returned back to work. She has found clonazepam to be very helpful in controlling her anxiety and would like to continue same regimen for now.  1. History of Bipolar 2 disorder ([place]), now in remission  2. Anxiety  - clonazePAM (KLONOPIN) 0.5 MG tablet; Take 1 tablet (0.5 mg total) by mouth 2 (two) times daily.  Dispense: 60 tablet; Refill: 1    F/up in 2 months.  N[place] Crane, MD 1/25/20221:44 PM

## 2020-09-03 ENCOUNTER — Other Ambulatory Visit: Payer: Self-pay

## 2020-09-03 ENCOUNTER — Ambulatory Visit: Payer: 59

## 2020-09-03 ENCOUNTER — Ambulatory Visit
Admission: RE | Admit: 2020-09-03 | Discharge: 2020-09-03 | Disposition: A | Discharge status: Home / Self Care | Payer: 59 | Source: Ambulatory Visit | Attending: Radiation Oncology | Admitting: Radiation Oncology

## 2020-09-03 DIAGNOSIS — Z510 Encounter for antineoplastic radiation therapy: Secondary | ICD-10-CM | POA: Diagnosis not present

## 2020-09-04 ENCOUNTER — Encounter: Payer: Self-pay | Admitting: Hematology

## 2020-09-04 ENCOUNTER — Ambulatory Visit: Payer: 59

## 2020-09-04 ENCOUNTER — Other Ambulatory Visit: Payer: Self-pay

## 2020-09-04 ENCOUNTER — Ambulatory Visit
Admission: RE | Admit: 2020-09-04 | Discharge: 2020-09-04 | Disposition: A | Discharge status: Home / Self Care | Payer: 59 | Source: Ambulatory Visit | Attending: Radiation Oncology | Admitting: Radiation Oncology

## 2020-09-04 ENCOUNTER — Inpatient Hospital Stay (HOSPITAL_BASED_OUTPATIENT_CLINIC_OR_DEPARTMENT_OTHER): Payer: 59 | Admitting: Hematology

## 2020-09-04 VITALS — BP 132/76 | HR 73 | Temp 97.6°F | Resp 16 | Ht 67.0 in | Wt 249.3 lb

## 2020-09-04 DIAGNOSIS — D0511 Intraductal carcinoma in situ of right breast: Secondary | ICD-10-CM

## 2020-09-04 DIAGNOSIS — Z510 Encounter for antineoplastic radiation therapy: Secondary | ICD-10-CM | POA: Diagnosis not present

## 2020-09-05 ENCOUNTER — Ambulatory Visit: Payer: 59

## 2020-09-05 ENCOUNTER — Telehealth: Payer: Self-pay | Admitting: Hematology

## 2020-09-05 NOTE — Telephone Encounter (Signed)
Scheduled appointment per 1/27 los. Will mail updated calendar to patient.

## 2020-09-08 ENCOUNTER — Other Ambulatory Visit: Payer: Self-pay

## 2020-09-08 ENCOUNTER — Encounter: Payer: Self-pay | Admitting: *Deleted

## 2020-09-08 ENCOUNTER — Telehealth: Payer: Self-pay | Admitting: Hematology

## 2020-09-08 ENCOUNTER — Ambulatory Visit: Payer: 59

## 2020-09-08 ENCOUNTER — Ambulatory Visit
Admission: RE | Admit: 2020-09-08 | Discharge: 2020-09-08 | Disposition: A | Discharge status: Home / Self Care | Payer: 59 | Source: Ambulatory Visit | Attending: Radiation Oncology | Admitting: Radiation Oncology

## 2020-09-08 DIAGNOSIS — Z510 Encounter for antineoplastic radiation therapy: Secondary | ICD-10-CM | POA: Diagnosis not present

## 2020-09-09 ENCOUNTER — Other Ambulatory Visit: Payer: Self-pay

## 2020-09-09 ENCOUNTER — Ambulatory Visit: Payer: 59

## 2020-09-09 ENCOUNTER — Ambulatory Visit
Admission: RE | Admit: 2020-09-09 | Discharge: 2020-09-09 | Disposition: A | Discharge status: Home / Self Care | Payer: 59 | Source: Ambulatory Visit | Attending: Radiation Oncology | Admitting: Radiation Oncology

## 2020-09-09 DIAGNOSIS — D0511 Intraductal carcinoma in situ of right breast: Secondary | ICD-10-CM | POA: Insufficient documentation

## 2020-09-09 DIAGNOSIS — Z510 Encounter for antineoplastic radiation therapy: Secondary | ICD-10-CM | POA: Insufficient documentation

## 2020-09-10 ENCOUNTER — Ambulatory Visit
Admission: RE | Admit: 2020-09-10 | Discharge: 2020-09-10 | Disposition: A | Discharge status: Home / Self Care | Payer: 59 | Source: Ambulatory Visit | Attending: Radiation Oncology | Admitting: Radiation Oncology

## 2020-09-10 ENCOUNTER — Ambulatory Visit: Payer: 59

## 2020-09-10 ENCOUNTER — Other Ambulatory Visit: Payer: Self-pay

## 2020-09-10 DIAGNOSIS — Z510 Encounter for antineoplastic radiation therapy: Secondary | ICD-10-CM | POA: Diagnosis not present

## 2020-09-10 NOTE — Progress Notes (Deleted)
[facility]    Provider:  Dr Jaynee Eagles Requesting Provider: Martinique, Betty G, MD Primary Care Provider:  Martinique, Betty G, MD  CC:  ***  HPI:  Sue Jackson is a 40 y.o. female here as requested by Martinique, Betty G, MD for migraines.  Past medical history prediabetes, hypertension, headache, depression, obesity, ductal carcinoma in situ of the right breast, bipolar disorder, arthritis, anxiety.  I reviewed Martinique Betty's notes, she was requested to be seen in November, last appointment I see with Dr. Martinique was May 06, 2020 where she reported she had been diagnosed with right ductal carcinoma in situ, planning to perform a lumpectomy and radiation with tamoxifen.  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  She was started on propranolol to help with anxiety and she was also on lorazepam, following with psychiatrist.  Reviewed notes, labs and imaging from outside physicians, which showed ***  From a thorough review of records, medications tried that can be used in migraine management include: Amlodipine, propranolol, Celexa, Decadron injection, gabapentin, meclizine, meloxicam, Robaxin, Zofran injections, Compazine tablets, Zoloft, tizanidine, Topamax.  TSH 2.51 in April 2020, April 16, 2020 creatinine 1.11, BUN 17.  Review of Systems: Patient complains of symptoms per HPI as well as the following symptoms ***. Pertinent negatives and positives per HPI. All others negative.   Social History   Socioeconomic History  . Marital status: Married    Spouse name: Not  on file  . Number of children: 0  . Years of education: Not on file  . Highest education level: Not on file  Occupational History  . Not on file  Tobacco Use  . Smoking status: Former Research scientist (life sciences)  . Smokeless tobacco: Never Used  . Tobacco comment: social smoker  Vaping Use  . Vaping Use: Never used  Substance and Sexual Activity  . Alcohol use: Yes    Comment: occasionally  . Drug use: Yes    Types: Marijuana  . Sexual activity: Yes    Birth control/protection: None  Other Topics Concern  . Not on file  Social History Narrative  . Not on file   Social Determinants of Health   Financial Resource Strain: Low Risk   . Difficulty of Paying Living Expenses: Not very hard  Food Insecurity: No Food Insecurity  . Worried About Charity fundraiser in the Last Year: Never true  . Ran Out of Food in the Last Year: Never true  Transportation Needs: No Transportation Needs  . Lack of Transportation (Medical): No  . Lack of Transportation (Non-Medical): No  Physical Activity: Not on file  Stress: Not on file  Social Connections: Not on file  Intimate Partner Violence: Not on file    Family History  Problem Relation Age of Onset  . Arthritis Mother   . Asthma Mother   . Cirrhosis Father        d. 75  . Depression Maternal Aunt   . Hypertension Maternal Aunt   . Breast cancer Maternal Aunt 51  . Diabetes Maternal Grandfather   . Heart disease Maternal Grandfather   . Hypertension Maternal Grandfather   .  Stroke Maternal Grandfather   . Breast cancer Maternal Aunt 63  . Breast cancer Paternal Aunt   . Breast cancer Cousin        pat first cousin    Past Medical History:  Diagnosis Date  . Anemia 2019-2020  . Anxiety   . Arthritis   . Bipolar disorder ([place]) 2013  . Cancer ([place]) 2021  . Depression   . Family history of breast cancer   . Headache 2021  . Hypertension   . Pre-diabetes     Patient Active Problem List   Diagnosis Date Noted  . Genetic testing  08/20/2020  . Family history of breast cancer   . 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  . Bipolar 2 disorder ([place]) 11/16/2019  . Prediabetes 08/15/2018  . Anxiety 02/06/2018  . Low back pain radiating to right lower extremity 07/06/2017  . Upper back pain, chronic 07/06/2017  . Hypertension, essential, benign 03/11/2017  . Bipolar disorder with depression ([place]) 03/11/2017  . Severe obesity (BMI 35.0-35.9 with comorbidity) ([place]) 03/11/2017    Past Surgical History:  Procedure Laterality Date  . BREAST LUMPECTOMY WITH RADIOACTIVE SEED LOCALIZATION Right 06/12/2020   Procedure: RIGHT BREAST LUMPECTOMY WITH RADIOACTIVE SEED LOCALIZATION;  Surgeon: Coralie Keens, MD;  Location: [place];  Service: General;  Laterality: Right;  LMA    Current Outpatient Medications  Medication Sig Dispense Refill  . amLODipine-benazepril (LOTREL) 5-10 MG capsule TAKE 1 CAPSULE BY MOUTH EVERY DAY (Patient taking differently: Take 1 capsule by mouth daily. ) 90 capsule 2  . clonazePAM (KLONOPIN) 0.5 MG tablet Take 1 tablet (0.5 mg total) by mouth 2 (two) times daily. 60 tablet 1  . meclizine (ANTIVERT) 25 MG tablet Take 1 tablet (25 mg total) by mouth 3 (three) times daily as needed for dizziness. 9045 tablet 1  . propranolol (INDERAL) 40 MG tablet Take 0.5 tablets (20 mg total) by mouth daily. 90 tablet 2   No current facility-administered medications for this visit.    Allergies as of 09/11/2020  . (No Known Allergies)    Vitals: There were no vitals taken for this visit. Last Weight:  Wt Readings from Last 1 Encounters:  09/04/20 249 lb 4.8 oz (113.1 kg)   Last Height:   Ht Readings from Last 1 Encounters:  09/04/20 5\' 7"  (1.702 m)     Physical exam: Exam: Gen: NAD, conversant, well nourised, obese, well groomed                     CV: RRR, no MRG. No Carotid Bruits. No peripheral edema, warm, nontender Eyes: Conjunctivae clear without exudates or  hemorrhage  Neuro: Detailed Neurologic Exam  Speech:    Speech is normal; fluent and spontaneous with normal comprehension.  Cognition:    The patient is oriented to person, place, and time;     recent and remote memory intact;     language fluent;     normal attention, concentration,     fund of knowledge Cranial Nerves:    The pupils are equal, round, and reactive to light. The fundi are normal and spontaneous venous pulsations are present. Visual fields are full to finger confrontation. Extraocular movements are intact. Trigeminal sensation is intact and the muscles of mastication are normal. The face is symmetric. The palate elevates in the midline. Hearing intact. Voice is normal. Shoulder shrug is normal. The tongue has normal motion without fasciculations.   Coordination:    Normal finger  to nose and heel to shin. Normal rapid alternating movements.   Gait:    Heel-toe and tandem gait are normal.   Motor Observation:    No asymmetry, no atrophy, and no involuntary movements noted. Tone:    Normal muscle tone.    Posture:    Posture is normal. normal erect    Strength:    Strength is V/V in the upper and lower limbs.      Sensation: intact to LT     Reflex Exam:  DTR's:    Deep tendon reflexes in the upper and lower extremities are normal bilaterally.   Toes:    The toes are downgoing bilaterally.   Clonus:    Clonus is absent.    Assessment/Plan:    No orders of the defined types were placed in this encounter.  No orders of the defined types were placed in this encounter.   Cc: Martinique, Betty G, MD,  Martinique, Betty G, MD  Sarina Ill, MD  [facility] [address] [place], [place] [address]  Phone [phone] Fax [phone]

## 2020-09-11 ENCOUNTER — Encounter: Payer: Self-pay | Admitting: Neurology

## 2020-09-11 ENCOUNTER — Ambulatory Visit
Admission: RE | Admit: 2020-09-11 | Discharge: 2020-09-11 | Disposition: A | Discharge status: Home / Self Care | Payer: 59 | Source: Ambulatory Visit | Attending: Radiation Oncology | Admitting: Radiation Oncology

## 2020-09-11 ENCOUNTER — Ambulatory Visit: Payer: 59 | Admitting: Neurology

## 2020-09-11 ENCOUNTER — Ambulatory Visit: Payer: 59

## 2020-09-11 DIAGNOSIS — Z510 Encounter for antineoplastic radiation therapy: Secondary | ICD-10-CM | POA: Diagnosis not present

## 2020-09-12 ENCOUNTER — Telehealth (HOSPITAL_COMMUNITY): Payer: 59 | Admitting: Psychiatry

## 2020-09-12 ENCOUNTER — Ambulatory Visit
Admission: RE | Admit: 2020-09-12 | Discharge: 2020-09-12 | Disposition: A | Discharge status: Home / Self Care | Payer: 59 | Source: Ambulatory Visit | Attending: Radiation Oncology | Admitting: Radiation Oncology

## 2020-09-12 ENCOUNTER — Other Ambulatory Visit: Payer: Self-pay

## 2020-09-12 DIAGNOSIS — Z510 Encounter for antineoplastic radiation therapy: Secondary | ICD-10-CM | POA: Diagnosis not present

## 2020-09-15 ENCOUNTER — Encounter: Payer: Self-pay | Admitting: Radiation Oncology

## 2020-09-15 NOTE — Progress Notes (Signed)
  Patient Name: Sue Jackson MRN: [id number] DOB: 04/25/1981 Referring Physician: Martinique BETTY (Profile Not Attached) Date of Service: 09/12/2020 [facility], [place]                                                        End Of Treatment Note  Diagnoses: D05.11-Intraductal carcinoma in situ of right breast  Cancer Staging: Intermediate grade, ER/PR positive DCIS of the right breast.   Intent: Curative  Radiation Treatment Dates: 07/23/2020 through 09/12/2020 Site Technique Total Dose (Gy) Dose per Fx (Gy) Completed Fx Beam Energies  Breast, Right: Breast_Rt 3D 50.4/50.4 1.8 28/28 6X  Breast, Right: Breast_Rt_Bst specialPort 10/10 2 5/5 12E, 15E   Narrative: The patient tolerated radiation therapy relatively well. She did have expected treatment effects of the skin in the treatment field.  Plan: The patient will receive a call in about one month from the radiation oncology department. She will continue follow up with Dr. Burr Medico as well.   ________________________________________________    Carola Rhine, [facility]

## 2020-10-01 ENCOUNTER — Telehealth: Payer: Self-pay | Admitting: *Deleted

## 2020-10-01 NOTE — Telephone Encounter (Signed)
CALLED PATIENT TO ALTER POST TREATMENT CALL DUE TO PATIENT REQUEST, UNABLE TO LEAVE MESSAGE DUE TO VM BEING FULL

## 2020-10-23 ENCOUNTER — Telehealth: Payer: Self-pay | Admitting: *Deleted

## 2020-10-23 NOTE — Telephone Encounter (Signed)
RETURNED PATIENT'S PHONE CALL, LVM FOR A RETURN CALL 

## 2020-10-29 ENCOUNTER — Telehealth (INDEPENDENT_AMBULATORY_CARE_PROVIDER_SITE_OTHER): Payer: 59 | Admitting: Psychiatry

## 2020-10-29 ENCOUNTER — Encounter (HOSPITAL_COMMUNITY): Payer: Self-pay | Admitting: Psychiatry

## 2020-10-29 ENCOUNTER — Other Ambulatory Visit: Payer: Self-pay

## 2020-10-29 DIAGNOSIS — F419 Anxiety disorder, unspecified: Secondary | ICD-10-CM | POA: Diagnosis not present

## 2020-10-29 DIAGNOSIS — F3181 Bipolar II disorder: Secondary | ICD-10-CM

## 2020-10-29 MED ORDER — CLONAZEPAM 0.5 MG PO TABS: 0.5000 mg | ORAL_TABLET | Freq: Two times a day (BID) | ORAL | 2 refills | Status: DC

## 2020-10-29 NOTE — Progress Notes (Signed)
[facility] Progress Note  Virtual Visit via Video Note  I connected with Sue Jackson on 10/29/20 at 11:20 AM EDT by a video enabled telemedicine application and verified that I am speaking with the correct person using two identifiers.  Location: Patient: Home Provider: Clinic   I discussed the limitations of evaluation and management by telemedicine and the availability of in person appointments. The patient expressed understanding and agreed to proceed.  I provided 14 minutes of non-face-to-face time during this encounter.       Patient Identification: Sue Jackson MRN:  [id number] Date of Evaluation:  10/29/2020    Chief Complaint:   " I am recuperating."  Visit Diagnosis:    ICD-10-CM   1. Anxiety  F41.9 clonazePAM (KLONOPIN) 0.5 MG tablet  2. History of Bipolar 2 disorder ([place]), now in remission  F31.81     History of Present Illness: Patient reported she is doing well overall.  She completed her radiation treatment for her breast cancer.  She stated that she is doing better physically however has intermittent chest wall pains.  She stated that she was at work yesterday and she suddenly developed severe pain in her chest and she called her specialist and they recommended that she takes rest for the remaining day and she came back home early. She stated that overall her anxiety is well controlled however sometimes she feels a little bit more anxious than usual.  She stated for the most part the pain medicine helps and she is not sure if the medicine dose needs to be adjusted at this time. Writer recommended that since overall things are progressing well we can continue same regimen for now. She denied any other concerns or issues.  She did mention that she did get a hefty bill from the hospital and now she has to work hard so that she can pay all those bills. She is appreciative of her medical insurance that covers most of the expenses.     Past  Psychiatric History: Bipolar depression, anxiety.  History of 1 prior psychiatry hospitalization in 2013 after overdosing on Effexor.  Previous Psychotropic Medications: Yes , Effexor-overdosed, did not like the side effects, Prozac-did not help much, Zoloft-made her feel "crazy", lithium-help with mood but did not like the way it made her feel, Seroquel-cannot recall if she took it regularly.  Substance Abuse History in the last 12 months:  No.  Consequences of Substance Abuse: NA  Past Medical History:  Past Medical History:  Diagnosis Date  . Anemia 2019-2020  . Anxiety   . Arthritis   . Bipolar disorder ([place]) 2013  . Cancer ([place]) 2021  . Depression   . Family history of breast cancer   . Headache 2021  . Hypertension   . Pre-diabetes     Past Surgical History:  Procedure Laterality Date  . BREAST LUMPECTOMY WITH RADIOACTIVE SEED LOCALIZATION Right 06/12/2020   Procedure: RIGHT BREAST LUMPECTOMY WITH RADIOACTIVE SEED LOCALIZATION;  Surgeon: Coralie Keens, MD;  Location: [place];  Service: General;  Laterality: Right;  LMA    Family Psychiatric History: Maternal aunt- depression  Family History:  Family History  Problem Relation Age of Onset  . Arthritis Mother   . Asthma Mother   . Cirrhosis Father        d. 27  . Depression Maternal Aunt   . Hypertension Maternal Aunt   . Breast cancer Maternal Aunt 51  . Diabetes Maternal Grandfather   . Heart disease Maternal  Grandfather   . Hypertension Maternal Grandfather   . Stroke Maternal Grandfather   . Breast cancer Maternal Aunt 20  . Breast cancer Paternal Aunt   . Breast cancer Cousin        pat first cousin    Social History:   Social History   Socioeconomic History  . Marital status: Married    Spouse name: Not on file  . Number of children: 0  . Years of education: Not on file  . Highest education level: Not on file  Occupational History  . Not on file  Tobacco Use  . Smoking status: Former Research scientist (life sciences)   . Smokeless tobacco: Never Used  . Tobacco comment: social smoker  Vaping Use  . Vaping Use: Never used  Substance and Sexual Activity  . Alcohol use: Yes    Comment: occasionally  . Drug use: Yes    Types: Marijuana  . Sexual activity: Yes    Birth control/protection: None  Other Topics Concern  . Not on file  Social History Narrative  . Not on file   Social Determinants of Health   Financial Resource Strain: Low Risk   . Difficulty of Paying Living Expenses: Not very hard  Food Insecurity: No Food Insecurity  . Worried About Charity fundraiser in the Last Year: Never true  . Ran Out of Food in the Last Year: Never true  Transportation Needs: No Transportation Needs  . Lack of Transportation (Medical): No  . 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  Lives with her partner/wife.  Does not have any children.  Allergies:  No Known Allergies  Metabolic Disorder Labs: Lab Results  Component Value Date   HGBA1C 6.4 01/30/2020   No results found for: PROLACTIN Lab Results  Component Value Date   CHOL 134 01/30/2020   TRIG 62.0 01/30/2020   HDL 45.50 01/30/2020   CHOLHDL 3 01/30/2020   VLDL 12.4 01/30/2020   LDLCALC 76 01/30/2020   LDLCALC 95 03/11/2017   Lab Results  Component Value Date   TSH 2.51 11/21/2018    Therapeutic Level Labs: No results found for: LITHIUM No results found for: CBMZ No results found for: VALPROATE  Current Medications: Current Outpatient Medications  Medication Sig Dispense Refill  . amLODipine-benazepril (LOTREL) 5-10 MG capsule TAKE 1 CAPSULE BY MOUTH EVERY DAY (Patient taking differently: Take 1 capsule by mouth daily. ) 90 capsule 2  . clonazePAM (KLONOPIN) 0.5 MG tablet Take 1 tablet (0.5 mg total) by mouth 2 (two) times daily. 60 tablet 2  . meclizine  (ANTIVERT) 25 MG tablet Take 1 tablet (25 mg total) by mouth 3 (three) times daily as needed for dizziness. 9045 tablet 1  . propranolol (INDERAL) 40 MG tablet Take 0.5 tablets (20 mg total) by mouth daily. 90 tablet 2   No current facility-administered medications for this visit.    Musculoskeletal: Strength & Muscle Tone: unable to assess due to telemed visit Gait & Station: unable to assess due to telemed visit Patient leans: unable to assess due to telemed visit   Psychiatric Specialty Exam: Review of Systems  There were no vitals taken for this visit.There is no height or weight on file to calculate BMI.  General Appearance: Fairly Groomed  Eye Contact:  Good  Speech:  Clear and  Coherent and Normal Rate  Volume:  Normal  Mood:  Euthymic  Affect:  Congruent  Thought Process:  Goal Directed and Descriptions of Associations: Intact  Orientation:  Full (Time, Place, and Person)  Thought Content:  Logical  Suicidal Thoughts:  No  Homicidal Thoughts:  No  Memory:  Immediate;   Good Recent;   Good Remote;   Good  Judgement:  Fair  Insight:  Fair  Psychomotor Activity:  Normal  Concentration:  Concentration: Good and Attention Span: Good  Recall:  Good  Fund of Knowledge:Good  Language: Good  Akathisia:  Negative  Handed:  Right  AIMS (if indicated):  not done  Assets:  Communication Skills Desire for Improvement Financial Resources/Insurance Housing Social Support Vocational/Educational  ADL's:  Intact  Cognition: WNL  Sleep:  Good   PHQ2-9   Flowsheet Row Oncology Navigator from 04/16/2020 in [facility]  PHQ-2 Total Score 1     Assessment and Plan: Patient appears to be doing well in terms of anxiety symptoms.  We will continue the same regimen.  1. Anxiety  - clonazePAM (KLONOPIN) 0.5 MG tablet; Take 1 tablet (0.5 mg total) by mouth 2 (two) times daily.  Dispense: 60 tablet; Refill: 2  2. History of Bipolar 2 disorder ([place]),  now in remission  Continue same medication regimen. Follow up in 3 months.   [place] Crane, MD 3/23/202211:29 AM

## 2020-10-31 ENCOUNTER — Telehealth: Payer: Self-pay

## 2020-10-31 NOTE — Telephone Encounter (Signed)
I returned Ms Heinlen's phone call.  She states she has been having pain at the site of her breast biopsy and radiation.  She went to Dr Ninfa Linden today.  HE is recommending a mammogram now.  I reviewed this with her and provided emotional support.

## 2020-11-12 ENCOUNTER — Telehealth (HOSPITAL_COMMUNITY): Payer: Self-pay | Admitting: *Deleted

## 2020-11-12 NOTE — Telephone Encounter (Signed)
Patient called back after msg was relayed to her regarding her medication. She stated that she needs a work note from [place] for 4/6 & 4/7. I sent the msg to the dr & she stated that the note does not come from [place]. She also stated that the pt can get the note from her PCP since she's a breast cancer patient. Called pt to relay the message but she didn't pick up - LVM

## 2020-11-12 NOTE — Telephone Encounter (Signed)
Called patient to inform the provider would not be changing her dose at this time.

## 2020-11-12 NOTE — Telephone Encounter (Signed)
Please inform her that this is the highest dose that I can prescribe for her. Sorry.

## 2020-11-12 NOTE — Telephone Encounter (Signed)
VM to writer phone from patient stating she is feeling like her Ativan isnt working as well as it did and she is feeling anxious and edgy and wondering if there could be an adjustment made. She was seen recently by her provider and she was directed to continue her current regimen. Will bring patients concern to Dr Toy Care for consideration.

## 2020-12-01 ENCOUNTER — Ambulatory Visit
Admission: RE | Admit: 2020-12-01 | Discharge: 2020-12-01 | Disposition: A | Discharge status: Home / Self Care | Payer: 59 | Source: Ambulatory Visit | Attending: Radiation Oncology | Admitting: Radiation Oncology

## 2020-12-01 ENCOUNTER — Other Ambulatory Visit: Payer: Self-pay

## 2020-12-01 DIAGNOSIS — D0511 Intraductal carcinoma in situ of right breast: Secondary | ICD-10-CM

## 2020-12-01 NOTE — Progress Notes (Signed)
  Radiation Oncology         [id number]) [phone] ________________________________  Name: Sue Jackson MRN: [id number]  Date of Service: 12/01/2020  DOB: May 29, 1981  Post Treatment Telephone Note  Diagnosis:   Intermediategrade, ER/PR positive DCIS of the right breast.    Interval Since Last Radiation:  12 weeks   07/23/2020 through 09/12/2020 Site Technique Total Dose (Gy) Dose per Fx (Gy) Completed Fx Beam Energies  Breast, Right: Breast_Rt 3D 50.4/50.4 1.8 28/28 6X  Breast, Right: Breast_Rt_Bst specialPort 10/10 2 5/5 12E, 15E     Narrative:  The patient was contacted today for routine follow-up. During treatment she did very well with radiotherapy and did not have significant desquamation.   Impression/Plan: 1.  Intermediategrade, ER/PR positive DCIS of the right breast. I was unable to reach the patient to so I left a message and on it,  discussed that we would be happy to continue to follow her as needed, but she will also continue to follow up with Dr. Lisbeth Renshaw  in medical oncology. She was counseled on skin care as well as measures to avoid sun exposure to this area.       Carola Rhine, PAC

## 2020-12-14 ENCOUNTER — Other Ambulatory Visit (HOSPITAL_COMMUNITY): Payer: Self-pay | Admitting: Psychiatry

## 2020-12-14 DIAGNOSIS — F419 Anxiety disorder, unspecified: Secondary | ICD-10-CM

## 2021-01-18 ENCOUNTER — Other Ambulatory Visit: Payer: Self-pay | Admitting: Family Medicine

## 2021-01-18 DIAGNOSIS — I10 Essential (primary) hypertension: Secondary | ICD-10-CM

## 2021-01-26 ENCOUNTER — Other Ambulatory Visit: Payer: Self-pay

## 2021-01-26 ENCOUNTER — Ambulatory Visit: Payer: Self-pay

## 2021-01-26 ENCOUNTER — Encounter: Payer: Self-pay | Admitting: Orthopedic Surgery

## 2021-01-26 ENCOUNTER — Ambulatory Visit (INDEPENDENT_AMBULATORY_CARE_PROVIDER_SITE_OTHER): Payer: 59 | Admitting: Physician Assistant

## 2021-01-26 DIAGNOSIS — M5441 Lumbago with sciatica, right side: Secondary | ICD-10-CM | POA: Diagnosis not present

## 2021-01-26 DIAGNOSIS — M5442 Lumbago with sciatica, left side: Secondary | ICD-10-CM

## 2021-01-26 DIAGNOSIS — G8929 Other chronic pain: Secondary | ICD-10-CM

## 2021-01-26 MED ORDER — PREDNISONE 10 MG PO TABS: 10.0000 mg | ORAL_TABLET | Freq: Every day | ORAL | 0 refills | Status: DC

## 2021-01-26 NOTE — Addendum Note (Signed)
Addended by: Georgette Dover on: 01/26/2021 01:45 PM   Modules accepted: Orders

## 2021-01-26 NOTE — Progress Notes (Signed)
Office Visit Note   Patient: Sue Jackson           Date of Birth: [birth year]           MRN: [id number] Visit Date: 01/26/2021              Requested by: Martinique, Betty G, MD [address] [place],  [place] [address] PCP: Martinique, Betty G, MD  Chief Complaint  Patient presents with   Lower Back - Pain      HPI: This is a pleasant 40 year old woman who works for the [employer].  She has a history of what she says is degenerative disc disease in her back.  A couple weeks ago after coming home from work she had a significant increase in her back pain.  Her biggest complaint is pain that is in her lower back radiates into her hips down into her feet.  She denies any paresthesias loss of bowel or bladder control.  She has typically treated this with chiropractic care in the past  Assessment & Plan: Visit Diagnoses:  1. Chronic bilateral low back pain with bilateral sciatica     Plan: Will start her on a course of 10 mg of prednisone daily with breakfast.  Follow-up in 3 weeks.  If no significant improvement would recommend a MRI of her lumbar spine and then follow-up with Dr. Ernestina Patches for potential epidural steroid injection  Follow-Up Instructions: No follow-ups on file.   Ortho Exam  Patient is alert, oriented, no adenopathy, well-dressed, normal affect, normal respiratory effort. Examination of her lower back no clinical abnormalities to palpation no tenderness to palpation.  She does arises slowly out of a chair.  She has negative straight leg raise bilaterally her dorsiflexion and plantarflexion strength is 5/5 bilaterally  Imaging: No results found. No images are attached to the encounter.  Labs: Lab Results  Component Value Date   HGBA1C 6.4 01/30/2020   HGBA1C 6.0 (A) 08/15/2018   HGBA1C 6.2 (A) 02/06/2018     Lab Results  Component Value Date   ALBUMIN 3.5 04/16/2020   ALBUMIN 4.1 01/30/2020   ALBUMIN 4.0 09/22/2019    No results found  for: MG No results found for: VD25OH  No results found for: PREALBUMIN CBC EXTENDED Latest Ref Rng & Units 06/05/2020 04/16/2020 09/22/2019  WBC 4.0 - 10.5 K/uL 11.1(H) 9.3 12.2(H)  RBC 3.87 - 5.11 MIL/uL 5.53(H) 5.28(H) 5.24(H)  HGB 12.0 - 15.0 g/dL 14.6 14.0 14.1  HCT 36.0 - 46.0 % 45.1 43.5 43.6  PLT 150 - 400 K/uL 402(H) 309 338  NEUTROABS 1.7 - 7.7 K/uL - 6.5 8.6(H)  LYMPHSABS 0.7 - 4.0 K/uL - 1.6 2.3     There is no height or weight on file to calculate BMI.  Orders:  Orders Placed This Encounter  Procedures   XR Lumbar Spine 2-3 Views   No orders of the defined types were placed in this encounter.    Procedures: No procedures performed  Clinical Data: No additional findings.  ROS:  All other systems negative, except as noted in the HPI. Review of Systems  Objective: Vital Signs: There were no vitals taken for this visit.  Specialty Comments:  No specialty comments available.  PMFS History: Patient Active Problem List   Diagnosis Date Noted   Genetic testing 08/20/2020   Family history of breast cancer    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   Bipolar 2 disorder ([place]) 11/16/2019  Prediabetes 08/15/2018   Anxiety 02/06/2018   Low back pain radiating to right lower extremity 07/06/2017   Upper back pain, chronic 07/06/2017   Hypertension, essential, benign 03/11/2017   Bipolar disorder with depression ([place]) 03/11/2017   Severe obesity (BMI 35.0-35.9 with comorbidity) ([place]) 03/11/2017   Past Medical History:  Diagnosis Date   Anemia 2019-2020   Anxiety    Arthritis    Bipolar disorder ([place]) 2013   Cancer ([facility]) 2021   Depression    Family history of breast cancer    Headache 2021   Hypertension    Pre-diabetes     Family History  Problem Relation Age of Onset   Arthritis Mother    Asthma Mother    Cirrhosis Father        d. 66   Depression Maternal Aunt    Hypertension Maternal Aunt    Breast cancer Maternal Aunt 51    Diabetes Maternal Grandfather    Heart disease Maternal Grandfather    Hypertension Maternal Grandfather    Stroke Maternal Grandfather    Breast cancer Maternal Aunt 59   Breast cancer Paternal Aunt    Breast cancer Cousin        pat first cousin    Past Surgical History:  Procedure Laterality Date   BREAST LUMPECTOMY WITH RADIOACTIVE SEED LOCALIZATION Right 06/12/2020   Procedure: RIGHT BREAST LUMPECTOMY WITH RADIOACTIVE SEED LOCALIZATION;  Surgeon: Coralie Keens, MD;  Location: [facility];  Service: General;  Laterality: Right;  LMA   Social History   Occupational History   Not on file  Tobacco Use   Smoking status: Former    Pack years: 0.00   Smokeless tobacco: Never   Tobacco comments:    social smoker  Vaping Use   Vaping Use: Never used  Substance and Sexual Activity   Alcohol use: Yes    Comment: occasionally   Drug use: Yes    Types: Marijuana   Sexual activity: Yes    Birth control/protection: None

## 2021-01-27 ENCOUNTER — Telehealth (INDEPENDENT_AMBULATORY_CARE_PROVIDER_SITE_OTHER): Payer: 59 | Admitting: Psychiatry

## 2021-01-27 ENCOUNTER — Encounter (HOSPITAL_COMMUNITY): Payer: Self-pay | Admitting: Psychiatry

## 2021-01-27 DIAGNOSIS — F3181 Bipolar II disorder: Secondary | ICD-10-CM

## 2021-01-27 DIAGNOSIS — F419 Anxiety disorder, unspecified: Secondary | ICD-10-CM

## 2021-01-27 MED ORDER — CLONAZEPAM 0.5 MG PO TABS: 0.5000 mg | ORAL_TABLET | Freq: Two times a day (BID) | ORAL | 2 refills | Status: DC

## 2021-01-27 NOTE — Progress Notes (Signed)
[facility] Progress Note  Virtual Visit via Telephone Note  I connected with Sue Jackson on 01/27/21 at 11:30 AM EDT by telephone and verified that I am speaking with the correct person using two identifiers.  Location: Patient: home Provider: Clinic   I discussed the limitations, risks, security and privacy concerns of performing an evaluation and management service by telephone and the availability of in person appointments. I also discussed with the patient that there may be a patient responsible charge related to this service. The patient expressed understanding and agreed to proceed.   I provided 16 minutes of non-face-to-face time during this encounter.    Patient Identification: Sue Jackson MRN:  [id number] Date of Evaluation:  01/27/2021    Chief Complaint:   "  I am doing okay."  Visit Diagnosis:    ICD-10-CM   1. Anxiety  F41.9     2. History of Bipolar 2 disorder ([place]), now in remission  F31.81       History of Present Illness: Patient stated overall she is doing well.  She goes to work on a regular basis.  She stated that for the past few weeks her back had been impacting her a lot and she went to see an orthopedic provider yesterday.  She stated that she is now taking prednisone and she is hoping that this back issue will go away soon. Writer noted that she was seen by an PA at orthopedic clinic and they recommended prednisone with plan of ordering MRI of the spine if her pain does not subside over the next few weeks. 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 She stated that overall she feels she is doing better but her back is something that she needs to take care of for now. She denies any other concerns or issues today. Requested refills for clonazepam, would like to continue the same dose for now.   Past Psychiatric History: Bipolar depression, anxiety.  History of 1 prior psychiatry hospitalization in 2013  after overdosing on Effexor.  Previous Psychotropic Medications: Yes , Effexor-overdosed, did not like the side effects, Prozac-did not help much, Zoloft-made her feel "crazy", lithium-help with mood but did not like the way it made her feel, Seroquel-cannot recall if she took it regularly.  Substance Abuse History in the last 12 months:  No.  Consequences of Substance Abuse: NA  Past Medical History:  Past Medical History:  Diagnosis Date   Anemia 2019-2020   Anxiety    Arthritis    Bipolar disorder ([facility]) 2013   Cancer ([facility]) 2021   Depression    Family history of breast cancer    Headache 2021   Hypertension    Pre-diabetes     Past Surgical History:  Procedure Laterality Date   BREAST LUMPECTOMY WITH RADIOACTIVE SEED LOCALIZATION Right 06/12/2020   Procedure: RIGHT BREAST LUMPECTOMY WITH RADIOACTIVE SEED LOCALIZATION;  Surgeon: Coralie Keens, MD;  Location: [facility];  Service: General;  Laterality: Right;  LMA    Family Psychiatric History: Maternal aunt- depression  Family History:  Family History  Problem Relation Age of Onset   Arthritis Mother    Asthma Mother    Cirrhosis Father        d. 13   Depression Maternal Aunt    Hypertension Maternal Aunt    Breast cancer Maternal Aunt 51   Diabetes Maternal Grandfather    Heart disease Maternal Grandfather    Hypertension Maternal Grandfather  Stroke Maternal Grandfather    Breast cancer Maternal Aunt 59   Breast cancer Paternal Aunt    Breast cancer Cousin        pat first cousin    Social History:   Social History   Socioeconomic History   Marital status: Married    Spouse name: Not on file   Number of children: 0   Years of education: Not on file   Highest education level: Not on file  Occupational History   Not on file  Tobacco Use   Smoking status: Former    Pack years: 0.00   Smokeless tobacco: Never   Tobacco comments:    social smoker  Media planner   Vaping Use: Never used  Substance and  Sexual Activity   Alcohol use: Yes    Comment: occasionally   Drug use: Yes    Types: Marijuana   Sexual activity: Yes    Birth control/protection: None  Other Topics Concern   Not on file  Social History Narrative   Not on file   Social Determinants of Health   Financial Resource Strain: Low Risk    Difficulty of Paying Living Expenses: Not very hard  Food Insecurity: No Food Insecurity   Worried About Charity fundraiser in the Last Year: Never true   [place] in the Last Year: Never true  Transportation Needs: No Transportation Needs   Lack of Transportation (Medical): No   Lack of Transportation (Non-Medical): No  Physical Activity: Not on file  Stress: Not on file  Social Connections: Not on file    Additional Social History: 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  Lives with her partner/wife.  Does not have any children.  Allergies:  No Known Allergies  Metabolic Disorder Labs: Lab Results  Component Value Date   HGBA1C 6.4 01/30/2020   No results found for: PROLACTIN Lab Results  Component Value Date   CHOL 134 01/30/2020   TRIG 62.0 01/30/2020   HDL 45.50 01/30/2020   CHOLHDL 3 01/30/2020   VLDL 12.4 01/30/2020   LDLCALC 76 01/30/2020   LDLCALC 95 03/11/2017   Lab Results  Component Value Date   TSH 2.51 11/21/2018    Therapeutic Level Labs: No results found for: LITHIUM No results found for: CBMZ No results found for: VALPROATE  Current Medications: Current Outpatient Medications  Medication Sig Dispense Refill   amLODipine-benazepril (LOTREL) 5-10 MG capsule TAKE 1 CAPSULE BY MOUTH EVERY DAY 90 capsule 1   clonazePAM (KLONOPIN) 0.5 MG tablet Take 1 tablet (0.5 mg total) by mouth 2 (two) times daily. 60 tablet 2   meclizine (ANTIVERT) 25 MG tablet Take 1 tablet (25 mg total) by mouth 3 (three) times daily as needed for dizziness. 9045 tablet 1   predniSONE (DELTASONE) 10 MG tablet Take 1 tablet (10 mg total) by mouth  daily with breakfast. 30 tablet 0   propranolol (INDERAL) 40 MG tablet Take 0.5 tablets (20 mg total) by mouth daily. 90 tablet 2   No current facility-administered medications for this visit.    Musculoskeletal: Strength & Muscle Tone: unable to assess due to telemed visit Gait & Station: unable to assess due to telemed visit Patient leans: unable to assess due to telemed visit   Psychiatric Specialty Exam: Review of Systems  There were no vitals taken for this visit.There is no height or weight on file to calculate BMI.  General Appearance:  Unable to assess  due to phone visit  Eye Contact: unable to assess due to phone visit   Speech:  Clear and Coherent and Normal Rate  Volume:  Normal  Mood:  Euthymic  Affect:  Congruent  Thought Process:  Goal Directed and Descriptions of Associations: Intact  Orientation:  Full (Time, Place, and Person)  Thought Content:  Logical  Suicidal Thoughts:  No  Homicidal Thoughts:  No  Memory:  Immediate;   Good Recent;   Good Remote;   Good  Judgement:  Fair  Insight:  Fair  Psychomotor Activity:  Normal  Concentration:  Concentration: Good and Attention Span: Good  Recall:  Good  Fund of Knowledge:Good  Language: Good  Akathisia:  Negative  Handed:  Right  AIMS (if indicated):  not done  Assets:  Communication Skills Desire for Improvement Financial Resources/Insurance Housing Social Support Vocational/Educational  ADL's:  Intact  Cognition: WNL  Sleep:  Good   PHQ2-9    Flowsheet Row Oncology Navigator from 04/16/2020 in [facility]  PHQ-2 Total Score 1      Assessment and Plan: Patient is dealing with severe back issues and is undergoing treatment for the same.  1. Anxiety  - clonazePAM (KLONOPIN) 0.5 MG tablet; Take 1 tablet (0.5 mg total) by mouth 2 (two) times daily.  Dispense: 60 tablet; Refill: 2  2. History of Bipolar 2 disorder ([place]), now in remission  Continue same medication  regimen. Follow up in 3 months.  Patient was informed that her care is being transferred to a different provider due to the writer leaving the office.  She wished the writer the best.  [place] Crane, MD 6/21/202211:46 AM

## 2021-02-12 ENCOUNTER — Telehealth (HOSPITAL_COMMUNITY): Payer: Self-pay | Admitting: Psychiatry

## 2021-02-12 NOTE — Telephone Encounter (Signed)
Pt calling to report CLONAZEPAM 0.5 MG Taking 2 times a day is not as effective as it used to be. Pt reports it still works but not as good. Pt reports feeling anxious, lightheaded, tired; symptoms which trigger migraines.    LAST SEEN: 01/27/21 NEXT APPT: 04/27/21. [place]: [phone]

## 2021-02-13 ENCOUNTER — Telehealth (HOSPITAL_COMMUNITY): Payer: Self-pay | Admitting: *Deleted

## 2021-02-13 NOTE — Telephone Encounter (Signed)
Note from front desk staff that patient called to report she is no longer getting an adequate benefit from her Klonopin. Will forward her concern to her new provider Eulis Canner DNP to discuss with her.

## 2021-02-16 ENCOUNTER — Other Ambulatory Visit (HOSPITAL_COMMUNITY): Payer: Self-pay | Admitting: Psychiatry

## 2021-02-16 DIAGNOSIS — F419 Anxiety disorder, unspecified: Secondary | ICD-10-CM

## 2021-02-16 MED ORDER — FLUOXETINE HCL 10 MG PO CAPS: 10.0000 mg | ORAL_CAPSULE | Freq: Every day | ORAL | 2 refills | Status: DC

## 2021-02-16 NOTE — Telephone Encounter (Signed)
Patient called and stated that she want to stay on the Clonazepam and is not sure as to how to take her Prozac along with the Clonazepam. She stated that she wants to take it safely. She also stated that she takes 1/2 of a Propranolol (20mg ) in the morning. Please review and advise. Thank you

## 2021-02-16 NOTE — Telephone Encounter (Signed)
Writer spoke to patient who notes that she has been more anxious.  She notes that Klonopin has been ineffective and propanolol is not managing her anxiety as much.  Today she is agreeable to starting Prozac 10 mg to help manage anxiety and depression.  Potential side effects of medication and risks vs benefits of treatment vs non-treatment were explained and discussed. All questions were answered. She will follow-up with provider for further evaluation on 04/27/2021.  No other concerns noted at this time

## 2021-02-16 NOTE — Telephone Encounter (Signed)
Patient informed Probation officer that she has been having increased anxiety.  She informed Probation officer that Klonopin was effective in the past in managing her anxiety however notes that its not as effective.  Today Klonopin not increased.  She is agreeable to starting Prozac 10 mg daily to help manage anxiety. Potential side effects of medication and risks vs benefits of treatment vs non-treatment were explained and discussed. All questions were answered.  She notes that she has tried Zoloft in the past and it caused her to be restless.  She notes that hydroxyzine was not effective.  Patient also informed Probation officer that she tried lithium for mood stabilization however informed writer that caused her GI upset.  No other concerns noted at this time.

## 2021-02-23 ENCOUNTER — Ambulatory Visit: Payer: 59 | Admitting: Orthopedic Surgery

## 2021-03-02 ENCOUNTER — Ambulatory Visit: Payer: 59 | Admitting: Orthopedic Surgery

## 2021-03-12 ENCOUNTER — Other Ambulatory Visit (HOSPITAL_COMMUNITY): Payer: Self-pay | Admitting: Psychiatry

## 2021-03-12 DIAGNOSIS — F419 Anxiety disorder, unspecified: Secondary | ICD-10-CM

## 2021-03-23 ENCOUNTER — Ambulatory Visit: Payer: 59 | Admitting: Orthopedic Surgery

## 2021-04-06 ENCOUNTER — Other Ambulatory Visit: Payer: Self-pay

## 2021-04-06 ENCOUNTER — Ambulatory Visit
Admission: RE | Admit: 2021-04-06 | Discharge: 2021-04-06 | Disposition: A | Discharge status: Home / Self Care | Payer: 59 | Source: Ambulatory Visit | Attending: Hematology | Admitting: Hematology

## 2021-04-06 DIAGNOSIS — D0511 Intraductal carcinoma in situ of right breast: Secondary | ICD-10-CM

## 2021-04-06 HISTORY — DX: Personal history of irradiation: Z92.3

## 2021-04-06 IMAGING — MG DIGITAL DIAGNOSTIC BILAT W/ TOMO W/ CAD
6 of 9 series · 6 of 25 positions shown · non-contrast
Comparison: Previous exam(s).

CLINICAL DATA: History of RIGHT lumpectomy with radiation treatment
in [DATE].

EXAM:
DIGITAL DIAGNOSTIC BILATERAL MAMMOGRAM WITH TOMOSYNTHESIS AND CAD
TECHNIQUE: Bilateral digital diagnostic mammography and breast tomosynthesis
was performed. The images were evaluated with computer-aided
detection.

[R CC]
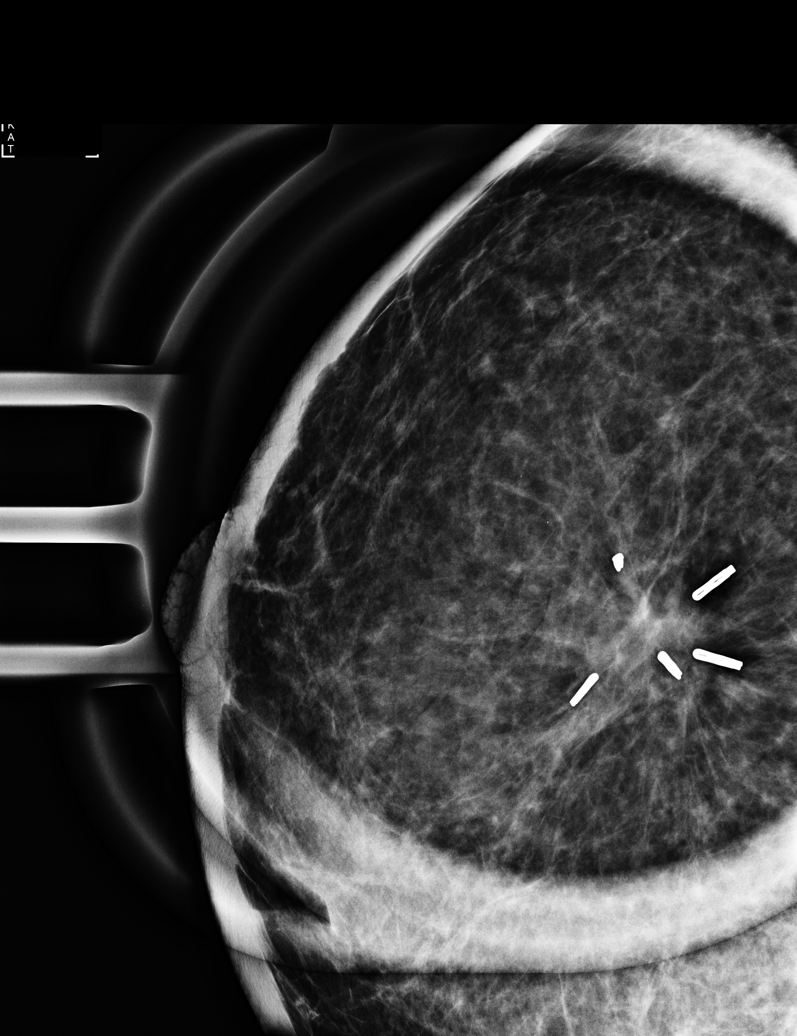

[R CC synth-2D]
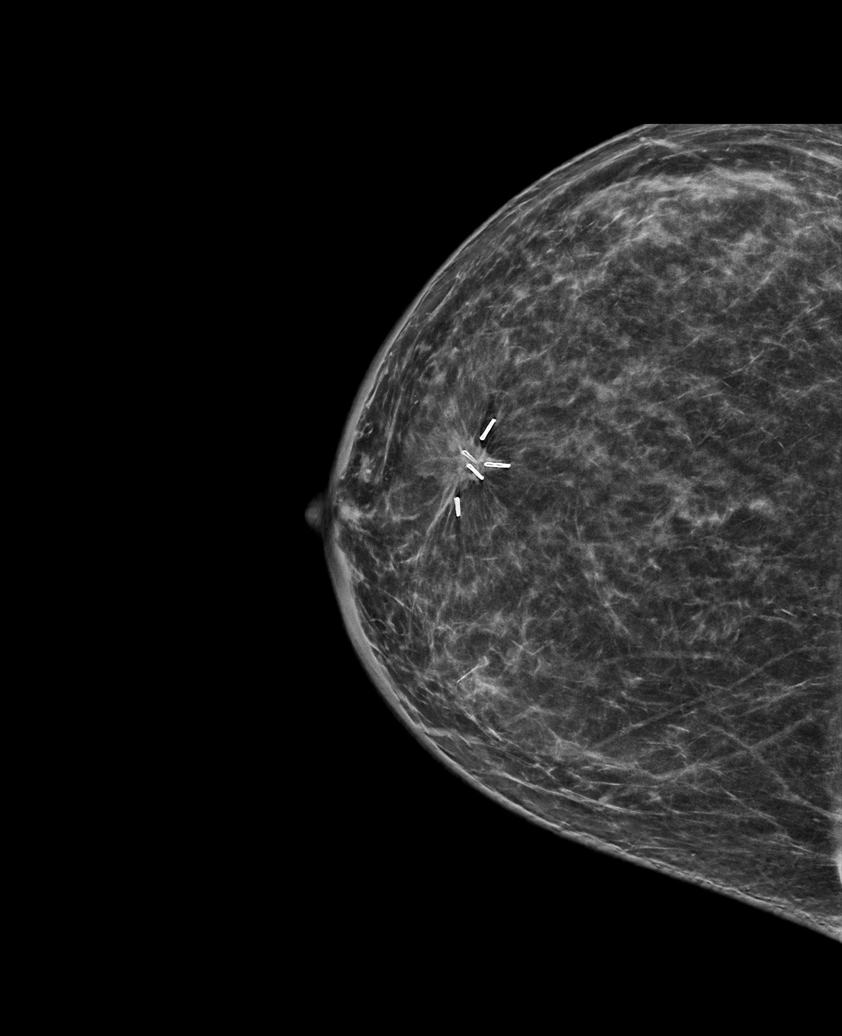

[R MLO synth-2D]
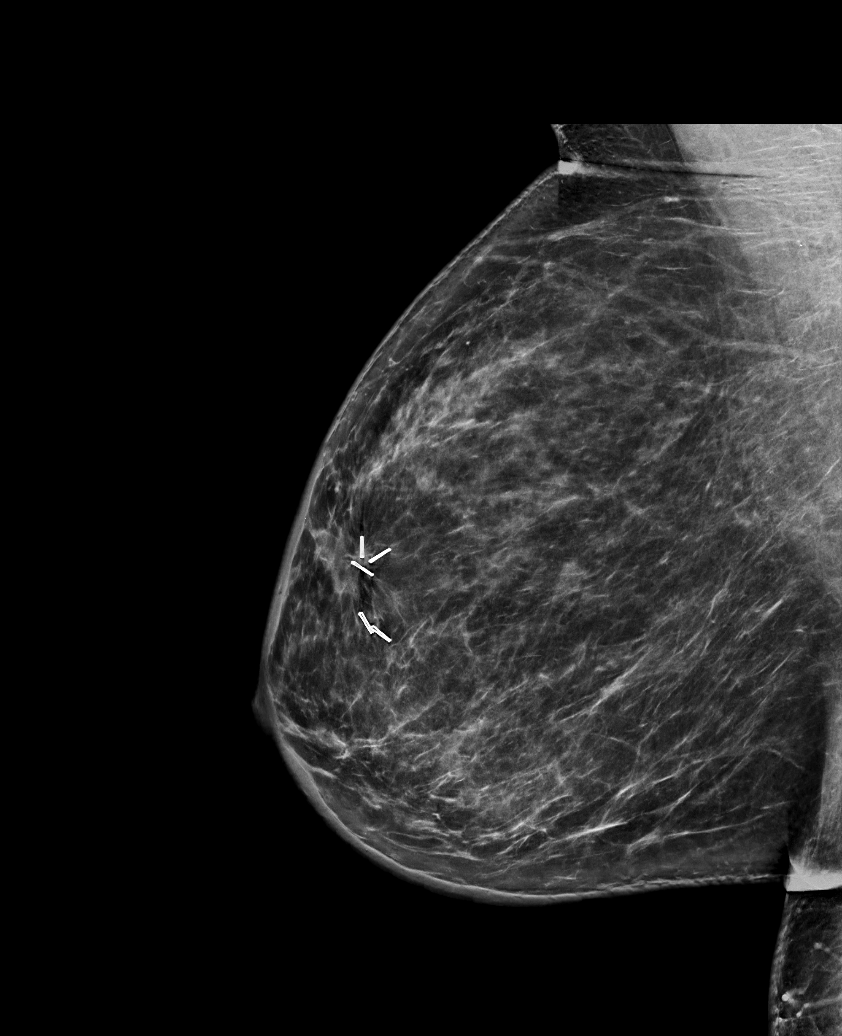

[L CC synth-2D]
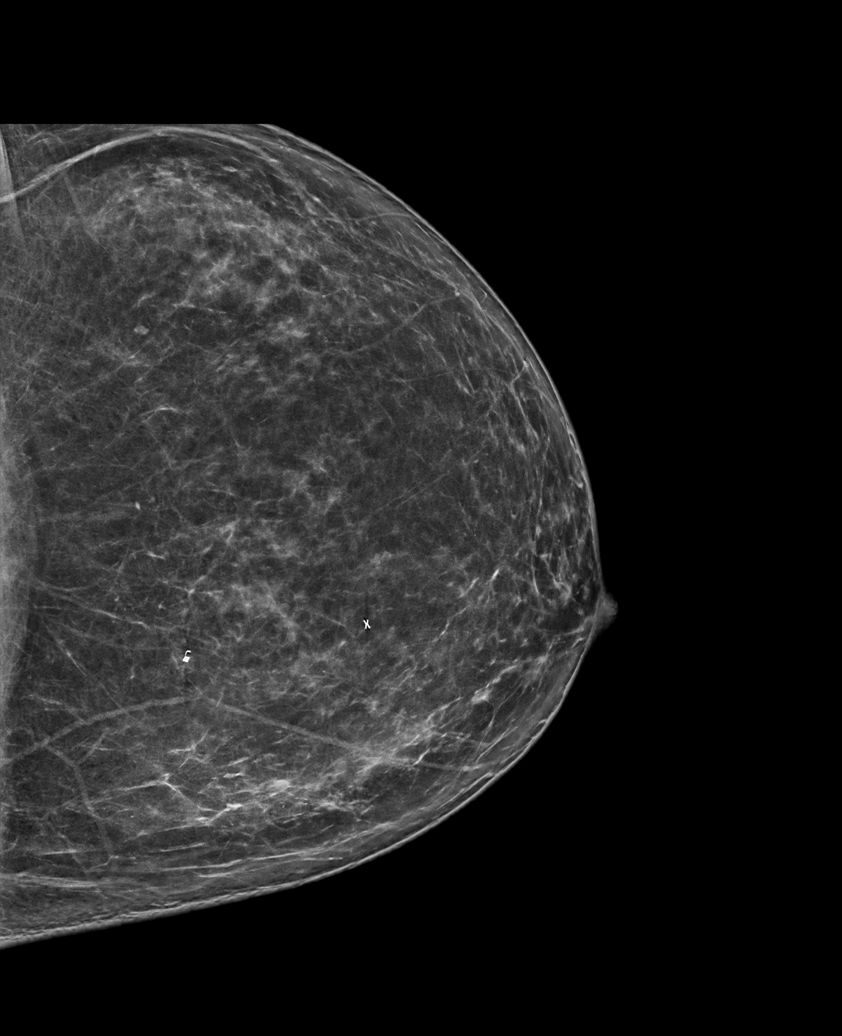

[L MLO synth-2D]
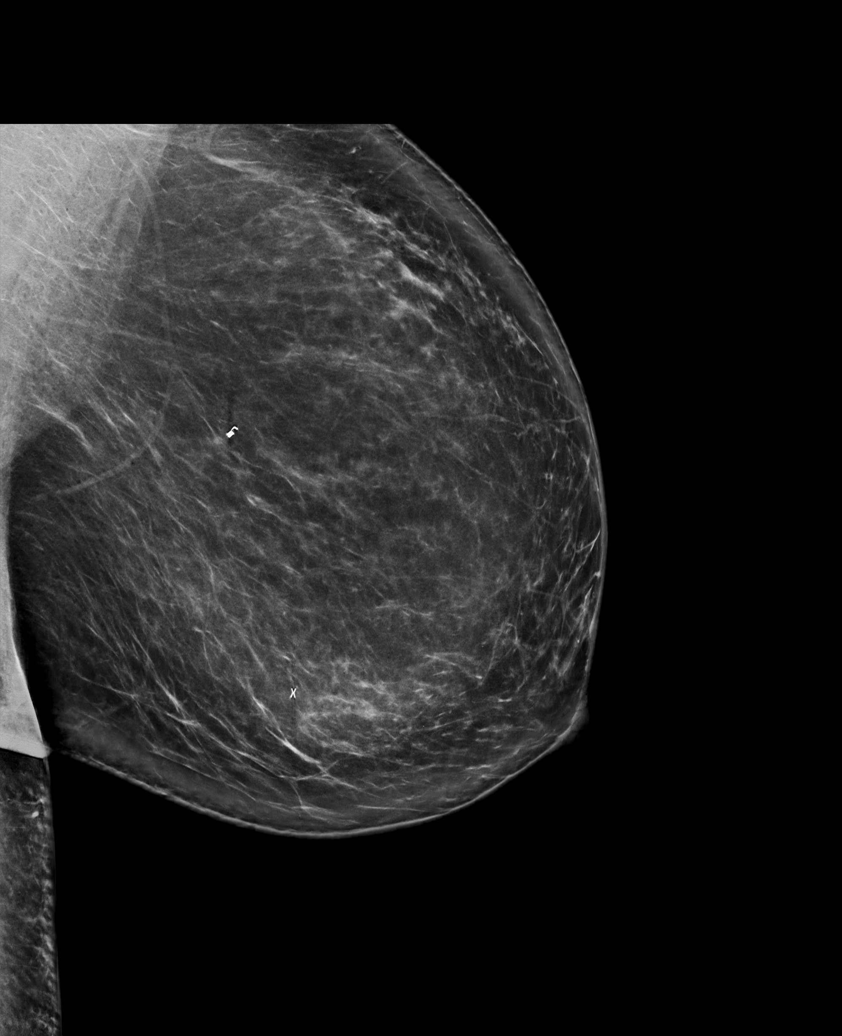

[L MLO tomo · tomo slice 48/95.0]
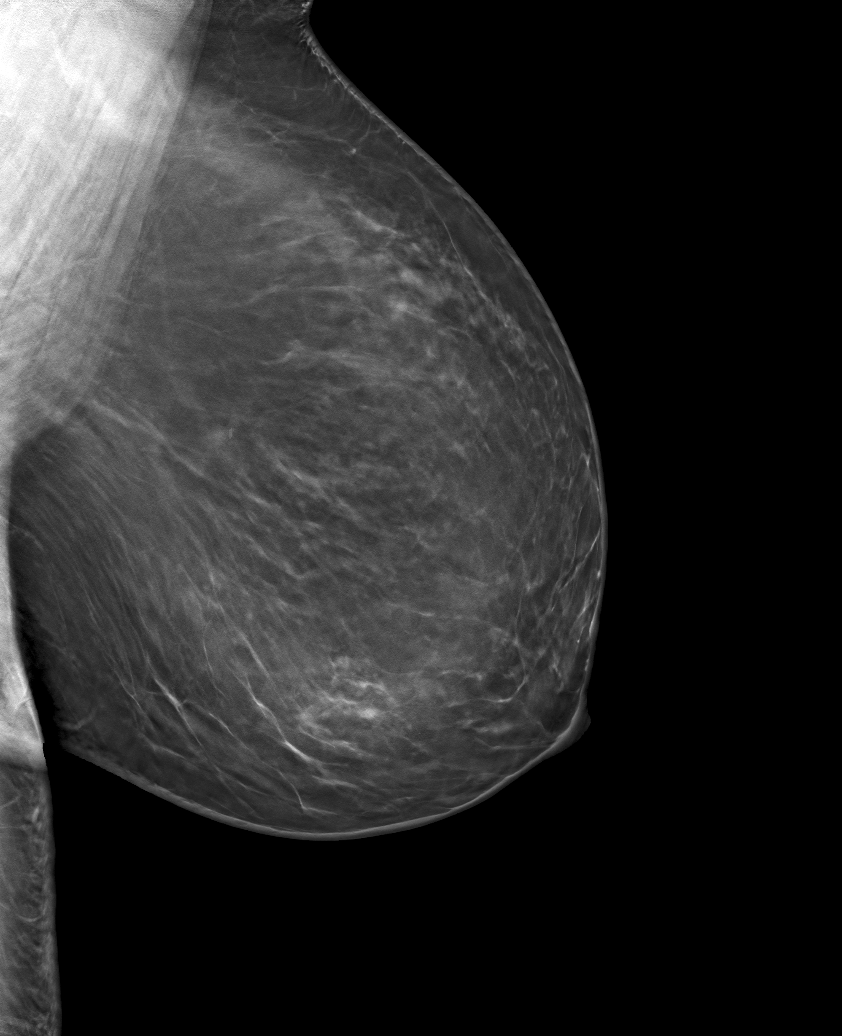

[6 of 25 positions shown; findings below may reference images not displayed]

ACR Breast Density Category b: There are scattered areas of
fibroglandular density.
FINDINGS: Post operative changes are seen in the RIGHTbreast. No suspicious
mass, distortion, or microcalcifications are identified to suggest
presence of malignancy.
IMPRESSION: No mammographic evidence for malignancy.

RECOMMENDATION:
Diagnostic mammogram is suggested in 1 year. (Code:[S9])

I have discussed the findings and recommendations with the patient.
If applicable, a reminder letter will be sent to the patient
regarding the next appointment.

BI-RADS CATEGORY  2: Benign.

## 2021-04-08 ENCOUNTER — Encounter: Payer: Self-pay | Admitting: Family Medicine

## 2021-04-09 ENCOUNTER — Telehealth: Payer: Self-pay

## 2021-04-15 ENCOUNTER — Telehealth: Payer: 59 | Admitting: Family Medicine

## 2021-04-21 ENCOUNTER — Telehealth: Payer: Self-pay

## 2021-04-21 NOTE — Telephone Encounter (Signed)
Patient called requesting FLMA documents be sent to her by mail. Documents were placed in the mail.

## 2021-04-27 ENCOUNTER — Telehealth (INDEPENDENT_AMBULATORY_CARE_PROVIDER_SITE_OTHER): Payer: 59 | Admitting: Psychiatry

## 2021-04-27 ENCOUNTER — Encounter (HOSPITAL_COMMUNITY): Payer: Self-pay | Admitting: Psychiatry

## 2021-04-27 DIAGNOSIS — F419 Anxiety disorder, unspecified: Secondary | ICD-10-CM | POA: Diagnosis not present

## 2021-04-27 DIAGNOSIS — F3181 Bipolar II disorder: Secondary | ICD-10-CM | POA: Diagnosis not present

## 2021-04-27 MED ORDER — CLONAZEPAM 0.5 MG PO TABS: 0.5000 mg | ORAL_TABLET | Freq: Two times a day (BID) | ORAL | 2 refills | Status: DC

## 2021-04-27 MED ORDER — GABAPENTIN 300 MG PO CAPS: 300.0000 mg | ORAL_CAPSULE | Freq: Three times a day (TID) | ORAL | 3 refills | Status: DC

## 2021-04-27 NOTE — Progress Notes (Signed)
[facility] MD/PA/NP OP Progress Note Virtual Visit via Video Note  I connected with Sue Jackson on 04/27/21 at  2:00 PM EDT by a video enabled telemedicine application and verified that I am speaking with the correct person using two identifiers.  Location: Patient: Home Provider: Clinic   I discussed the limitations of evaluation and management by telemedicine and the availability of in person appointments. The patient expressed understanding and agreed to proceed.  I provided 30 minutes of non-face-to-face time during this encounter.   04/27/2021 2:31 PM Sue Jackson  MRN:  [id number]  Chief Complaint: "Things has been a roller coaster"  HPI: 40 year old female seen today for follow-up psychiatric evaluation.  She is a former patient of Dr. Jean Rosenthal is being transferred to writer for medication management.  She has a psychiatric history of bipolar disorder, SI, depression, and anxiety.  She is currently managed on Prozac 10 mg daily and Klonopin 0.5 mg twice daily.  She informed Probation officer that she discontinued Prozac after taking it for 3 days because she found it ineffective.  Patient is also prescribed propanolol by her primary care doctor.  She notes that it worked wonders for her anxiety however notes that it caused hypotension and bradycardia.    Patient informed Probation officer that she has tried  Effexor (overdosed, did not like the side effects) Prozac (disliked), Zoloft (made her feel "crazy), lithium (help with mood but did not like the way it made her feel) and Seroquel in the past however notes that they were ineffective.  Today she notes that life has been a roller coaster.  She informed Probation officer that she has been irritable and overly anxious.  She notes that work is the source of her stress.  She informed Probation officer that she works at [employer] and notes that her coworkers are young and lazy.  She notes that their lack of motivation makes her really anxious.  Patient notes  that her anxiety has been increasingly bothersome lately and reports that it is interfering with her work.  She informed Probation officer that she is called out on several occasions.  She describes having panic attacks often.  She informed Probation officer that she gets sweaty and her hands/feet, her vision blurs, and she becomes lightheaded.  Provider conducted a GAD-7 and patient scored a 21.  Patient notes that her depression is not bothersome.  She informed Probation officer that she believes her anxiety exacerbates her depression and she scored a 12 on her PHQ-9.  She endorses sleeping 8 hours nightly however notes that it is disturbed.  She informed Probation officer that her appetite is good.  Today she denies SI/HI/VAH, mania, or paranoia.  Patient informed Probation officer that she would like to speak to her therapist regularly to help her cope with some of her anxiety.  Patient referred to outpatient counseling for therapy.  Her appointment is on November 14.    Visit Diagnosis:    ICD-10-CM   1. Bipolar 2 disorder (HCC)  F31.81 gabapentin (NEURONTIN) 300 MG capsule    Ambulatory referral to Social Work    2. Anxiety  F41.9 gabapentin (NEURONTIN) 300 MG capsule    clonazePAM (KLONOPIN) 0.5 MG tablet    Ambulatory referral to Social Work      Past Psychiatric History: : Bipolar depression, anxiety.  History of 1 prior psychiatry hospitalization in 2013 after overdosing on Effexor. Past Medical History:  Past Medical History:  Diagnosis Date   Anemia 2019-2020   Anxiety    Arthritis  Bipolar disorder ([place]) 2013   Cancer [facility]) 2021   Depression    Family history of breast cancer    Headache 2021   Hypertension    Personal history of radiation therapy    Pre-diabetes     Past Surgical History:  Procedure Laterality Date   BREAST BIOPSY Right 04/09/2020   BREAST BIOPSY Left 04/24/2020   x2   BREAST LUMPECTOMY Right 06/12/2020   BREAST LUMPECTOMY WITH RADIOACTIVE SEED LOCALIZATION Right 06/12/2020   Procedure: RIGHT  BREAST LUMPECTOMY WITH RADIOACTIVE SEED LOCALIZATION;  Surgeon: Coralie Keens, MD;  Location: [facility];  Service: General;  Laterality: Right;  LMA    Family Psychiatric History: Maternal aunt- depression  Family History:  Family History  Problem Relation Age of Onset   Arthritis Mother    Asthma Mother    Cirrhosis Father        d. 47   Depression Maternal Aunt    Hypertension Maternal Aunt    Breast cancer Maternal Aunt 51   Diabetes Maternal Grandfather    Heart disease Maternal Grandfather    Hypertension Maternal Grandfather    Stroke Maternal Grandfather    Breast cancer Maternal Aunt 59   Breast cancer Paternal Aunt    Breast cancer Cousin        pat first cousin    Social History:  Social History   Socioeconomic History   Marital status: Married    Spouse name: Not on file   Number of children: 0   Years of education: Not on file   Highest education level: Not on file  Occupational History   Not on file  Tobacco Use   Smoking status: Former   Smokeless tobacco: Never   Tobacco comments:    social smoker  Vaping Use   Vaping Use: Never used  Substance and Sexual Activity   Alcohol use: Yes    Comment: occasionally   Drug use: Yes    Types: Marijuana   Sexual activity: Yes    Birth control/protection: None  Other Topics Concern   Not on file  Social History Narrative   Not on file   Social Determinants of Health   Financial Resource Strain: Not on file  Food Insecurity: Not on file  Transportation Needs: Not on file  Physical Activity: Not on file  Stress: Not on file  Social Connections: Not on file    Allergies: No Known Allergies  Metabolic Disorder Labs: Lab Results  Component Value Date   HGBA1C 6.4 01/30/2020   No results found for: PROLACTIN Lab Results  Component Value Date   CHOL 134 01/30/2020   TRIG 62.0 01/30/2020   HDL 45.50 01/30/2020   CHOLHDL 3 01/30/2020   VLDL 12.4 01/30/2020   LDLCALC 76 01/30/2020   LDLCALC 95  03/11/2017   Lab Results  Component Value Date   TSH 2.51 11/21/2018   TSH 3.68 03/11/2017    Therapeutic Level Labs: No results found for: LITHIUM No results found for: VALPROATE No components found for:  CBMZ  Current Medications: Current Outpatient Medications  Medication Sig Dispense Refill   gabapentin (NEURONTIN) 300 MG capsule Take 1 capsule (300 mg total) by mouth 3 (three) times daily. 90 capsule 3   amLODipine-benazepril (LOTREL) 5-10 MG capsule TAKE 1 CAPSULE BY MOUTH EVERY DAY 90 capsule 1   clonazePAM (KLONOPIN) 0.5 MG tablet Take 1 tablet (0.5 mg total) by mouth 2 (two) times daily. 60 tablet 2   meclizine (ANTIVERT) 25 MG tablet  Take 1 tablet (25 mg total) by mouth 3 (three) times daily as needed for dizziness. 9045 tablet 1   predniSONE (DELTASONE) 10 MG tablet Take 1 tablet (10 mg total) by mouth daily with breakfast. 30 tablet 0   propranolol (INDERAL) 40 MG tablet Take 0.5 tablets (20 mg total) by mouth daily. 90 tablet 2   No current facility-administered medications for this visit.     Musculoskeletal: Strength & Muscle Tone:  Unable to assess due to telehealth visit [place]:  Unable to assess due to telehealth visit Patient leans: N/A  Psychiatric Specialty Exam: Review of Systems  There were no vitals taken for this visit.There is no height or weight on file to calculate BMI.  General Appearance: Well Groomed  Eye Contact:  Good  Speech:  Clear and Coherent and Normal Rate  Volume:  Normal  Mood:  Anxious and Depressed  Affect:  Appropriate and Congruent  Thought Process:  Coherent, Goal Directed, and Linear  Orientation:  Full (Time, Place, and Person)  Thought Content: WDL and Logical   Suicidal Thoughts:  Yes.  without intent/plan  Homicidal Thoughts:  No  Memory:  Immediate;   Good Recent;   Good Remote;   Good  Judgement:  Good  Insight:  Good  Psychomotor Activity:  Normal  Concentration:  Concentration: Good and Attention Span:  Good  Recall:  Good  Fund of Knowledge: Good  Language: Good  Akathisia:  No  Handed:  Right  AIMS (if indicated): not done  Assets:  Communication Skills Desire for Improvement Financial Resources/Insurance Housing Intimacy Physical Health Social Support  ADL's:  Intact  Cognition: WNL  Sleep:  Good   Screenings: GAD-7    Flowsheet Row Video Visit [facility] 04/27/2021 in [facility]  Total GAD-7 Score 21      PHQ2-9    Flowsheet Row Video Visit from 04/27/2021 in [facility] from 04/16/2020 in [facility]  PHQ-2 Total Score 2 1  PHQ-9 Total Score 12 --      Flowsheet Row Video Visit from 04/27/2021 in [place] [facility] Error: Q7 should not be populated when Q6 is No        Assessment and Plan: Patient endorses mild depression and increased anxiety.  Today she is agreeable to starting gabapentin 300 mg 3 times daily to help manage mood and anxiety.  Provider discussed reducing Klonopin over time.  She endorsed understanding and agreed.  At this time she would like to discontinue Prozac.  Patient referred to outpatient counseling for therapy.  1. Anxiety  Start- gabapentin (NEURONTIN) 300 MG capsule; Take 1 capsule (300 mg total) by mouth 3 (three) times daily.  Dispense: 90 capsule; Refill: 3 Continue- clonazePAM (KLONOPIN) 0.5 MG tablet; Take 1 tablet (0.5 mg total) by mouth 2 (two) times daily.  Dispense: 60 tablet; Refill: 2 - Ambulatory referral to Social Work  2. Bipolar 2 disorder ([place])  Start- gabapentin (NEURONTIN) 300 MG capsule; Take 1 capsule (300 mg total) by mouth 3 (three) times daily.  Dispense: 90 capsule; Refill: 3 - Ambulatory referral to Social Work   Follow-up in 3 months Follow-up with therapy  Salley Slaughter, NP 04/27/2021, 2:31 PM

## 2021-04-28 ENCOUNTER — Telehealth (HOSPITAL_COMMUNITY): Payer: Self-pay | Admitting: *Deleted

## 2021-04-28 NOTE — Telephone Encounter (Signed)
VM from patient stating she took her anxiety medicine last night and again this am, feels overly sedated by the two. SHe wants her provider to know that.

## 2021-04-28 NOTE — Telephone Encounter (Signed)
Provider called patient notes that she finds gabapentin effective in managing her anxiety however notes that after taking her second dose she felt very sedated "like a spirit"  Provider recommended reducing the dose however she notes that at this time she would like to continue where it is.  Provider informed patient to call if the medication continues to be over sedating and her dosage could be reduced to 100 mg 3 times a day instead of 300 mg 3 times a day.  She endorsed understanding and agreed.  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  She endorsed understanding and notes that she has taken some time off so  that she can adjust her medications.  No other concerns at this time.

## 2021-05-04 ENCOUNTER — Ambulatory Visit: Payer: 59 | Admitting: Orthopedic Surgery

## 2021-05-14 ENCOUNTER — Telehealth (HOSPITAL_COMMUNITY): Payer: Self-pay | Admitting: Psychiatry

## 2021-05-14 NOTE — Telephone Encounter (Signed)
Patient dropped off FMLA paperwork for Ronne Binning to complete & fax to HR [phone]. Call pt at [phone] once complete. Placed in provider's bin.

## 2021-05-18 ENCOUNTER — Telehealth (HOSPITAL_COMMUNITY): Payer: Self-pay | Admitting: *Deleted

## 2021-05-18 NOTE — Telephone Encounter (Signed)
Patient returned call to provider regarding forms.

## 2021-05-18 NOTE — Telephone Encounter (Signed)
PATIENT CALLED AFTER STATING SHE RECEIVED A CALL FROM PROVIDER & DIDN'T CATCH THE NUMBERS LEFT ON VM.

## 2021-05-18 NOTE — Telephone Encounter (Signed)
Patient informed Probation officer that there are days where she feels overwhelmed, anxious, has panic attacks, and has fluctuations in her mood due to hypomania.  She informed Probation officer that her medications has been helping and things are getting better however requested provider fill out FMLA forms as she does not feel fully mentally stable at this time.  Provider filled out forms and will have administrative staff fax it over to her employer.  No other concerns at this time.

## 2021-05-19 ENCOUNTER — Other Ambulatory Visit: Payer: Self-pay | Admitting: Family Medicine

## 2021-05-19 ENCOUNTER — Encounter: Payer: Self-pay | Admitting: Family Medicine

## 2021-05-19 DIAGNOSIS — R42 Dizziness and giddiness: Secondary | ICD-10-CM

## 2021-05-19 DIAGNOSIS — I10 Essential (primary) hypertension: Secondary | ICD-10-CM

## 2021-05-19 DIAGNOSIS — F419 Anxiety disorder, unspecified: Secondary | ICD-10-CM

## 2021-05-19 MED ORDER — MECLIZINE HCL 25 MG PO TABS: 25.0000 mg | ORAL_TABLET | Freq: Three times a day (TID) | ORAL | 1 refills | Status: DC | PRN

## 2021-05-19 MED ORDER — PROPRANOLOL HCL 40 MG PO TABS: 20.0000 mg | ORAL_TABLET | Freq: Every day | ORAL | 0 refills | Status: DC

## 2021-06-22 ENCOUNTER — Ambulatory Visit (HOSPITAL_COMMUNITY): Payer: 59 | Admitting: Clinical

## 2021-06-23 ENCOUNTER — Ambulatory Visit (HOSPITAL_COMMUNITY): Payer: 59 | Admitting: Clinical

## 2021-07-13 ENCOUNTER — Encounter (HOSPITAL_COMMUNITY): Payer: Self-pay | Admitting: Psychiatry

## 2021-07-13 ENCOUNTER — Telehealth (INDEPENDENT_AMBULATORY_CARE_PROVIDER_SITE_OTHER): Payer: 59 | Admitting: Psychiatry

## 2021-07-13 DIAGNOSIS — F419 Anxiety disorder, unspecified: Secondary | ICD-10-CM

## 2021-07-13 DIAGNOSIS — F3181 Bipolar II disorder: Secondary | ICD-10-CM | POA: Diagnosis not present

## 2021-07-13 MED ORDER — GABAPENTIN 400 MG PO CAPS: 400.0000 mg | ORAL_CAPSULE | Freq: Three times a day (TID) | ORAL | 3 refills | Status: DC

## 2021-07-13 MED ORDER — CLONAZEPAM 0.5 MG PO TABS: 0.5000 mg | ORAL_TABLET | Freq: Two times a day (BID) | ORAL | 2 refills | Status: DC

## 2021-07-13 NOTE — Progress Notes (Signed)
[facility] MD/PA/NP OP Progress Note Virtual Visit via Telephone Note  I connected with Sue Jackson on 07/13/21 at  4:00 PM EST by telephone and verified that I am speaking with the correct person using two identifiers.  Location: Patient: home Provider: Clinic   I discussed the limitations, risks, security and privacy concerns of performing an evaluation and management service by telephone and the availability of in person appointments. I also discussed with the patient that there may be a patient responsible charge related to this service. The patient expressed understanding and agreed to proceed.   I provided 30 minutes of non-face-to-face time during this encounter.    07/13/2021 4:39 PM Sue Jackson  MRN:  [id number]  Chief Complaint: "Thingsare okay. At times I think my medications need to be increased"  HPI: 40 year old female seen today for follow-up psychiatric evaluation. She has a psychiatric history of bipolar disorder, SI, depression, and anxiety.  She is currently managed on Gabapentin 300 mg three times daily and Klonopin 0.5 mg twice daily.  She informed Probation officer that her medications are somewhat effective in managing her psychiatric conditions.  Today patient was unable to login virtually so her assessment was done over the phone. During exam she was pleasant cooperative, and engaged in conversation. She informed Probation officer that things has improved since starting Gabapentin however notes that at time she believes it should be increased. She also notes that she has been trying to reduce her Klonopin as she understands it is addicting. Patient notes that life stressors worsen her anxiety. She notes that she is concerned about working at the [employer]. She informed provider that she is uncertain about her job security as there has been discussions about reductions and changing locations. Patient notes that when she becomes overly anxious she has headaches and  dizziness. Today provider conducted a GAD 7 and patient scored a 16, at her last visit she scored a 21. Provider also conducted a PHQ 9 and patient scored a 13, at her last visit she scored a 12. Patient endorses passive SI but denies wanting to harm herself today. Today she denies SI/HI/VAH, mania, or paranoia. She notes she sleeps approximately 9-10 hours and reports that she has an adequate appetite.     Today she is agreeable to increase Gabapentin 300 mg three times daily to 400 mg three times daily to help manage anxiety. She will continue all other medications as prescribed. Provider discused reducing Klonopin at her next visit. No other concerns noted at this time.     Visit Diagnosis:    ICD-10-CM   1. Anxiety  F41.9 gabapentin (NEURONTIN) 400 MG capsule    clonazePAM (KLONOPIN) 0.5 MG tablet    2. Bipolar 2 disorder (HCC)  F31.81 gabapentin (NEURONTIN) 400 MG capsule      Past Psychiatric History: : Bipolar depression, anxiety.  History of 1 prior psychiatry hospitaliation in 2013 after overdosing on Effexor. Past Medical History:  Past Medical History:  Diagnosis Date   Anemia 2019-2020   Anxiety    Arthritis    Bipolar disorder ([place]) 2013   Cancer ([place]) 2021   Depression    Family history of breast cancer    Headache 2021   Hypertension    Personal history of radiation therapy    Pre-diabetes     Past Surgical History:  Procedure Laterality Date   BREAST BIOPSY Right 04/09/2020   BREAST BIOPSY Left 04/24/2020   x2   BREAST LUMPECTOMY Right 06/12/2020  BREAST LUMPECTOMY WITH RADIOACTIVE SEED LOCALIZATION Right 06/12/2020   Procedure: RIGHT BREAST LUMPECTOMY WITH RADIOACTIVE SEED LOCALIZATION;  Surgeon: Coralie Keens, MD;  Location: [facility];  Service: General;  Laterality: Right;  LMA    Family Psychiatric History: Maternal aunt- depression  Family History:  Family History  Problem Relation Age of Onset   Arthritis Mother    Asthma Mother    Cirrhosis  Father        d. 15   Depression Maternal Aunt    Hypertension Maternal Aunt    Breast cancer Maternal Aunt 51   Diabetes Maternal Grandfather    Heart disease Maternal Grandfather    Hypertension Maternal Grandfather    Stroke Maternal Grandfather    Breast cancer Maternal Aunt 59   Breast cancer Paternal Aunt    Breast cancer Cousin        pat first cousin    Social History:  Social History   Socioeconomic History   Marital status: Married    Spouse name: Not on file   Number of children: 0   Years of education: Not on file   Highest education level: Not on file  Occupational History   Not on file  Tobacco Use   Smoking status: Former   Smokeless tobacco: Never   Tobacco comments:    social smoker  Vaping Use   Vaping Use: Never used  Substance and Sexual Activity   Alcohol use: Yes    Comment: occasionally   Drug use: Yes    Types: Marijuana   Sexual activity: Yes    Birth control/protection: None  Other Topics Concern   Not on file  Social History Narrative   Not on file   Social Determinants of Health   Financial Resource Strain: Not on file  Food Insecurity: Not on file  Transportation Needs: Not on file  Physical Activity: Not on file  Stress: Not on file  Social Connections: Not on file    Allergies: No Known Allergies  Metabolic Disorder Labs: Lab Results  Component Value Date   HGBA1C 6.4 01/30/2020   No results found for: PROLACTIN Lab Results  Component Value Date   CHOL 134 01/30/2020   TRIG 62.0 01/30/2020   HDL 45.50 01/30/2020   CHOLHDL 3 01/30/2020   VLDL 12.4 01/30/2020   LDLCALC 76 01/30/2020   LDLCALC 95 03/11/2017   Lab Results  Component Value Date   TSH 2.51 11/21/2018   TSH 3.68 03/11/2017    Therapeutic Level Labs: No results found for: LITHIUM No results found for: VALPROATE No components found for:  CBMZ  Current Medications: Current Outpatient Medications  Medication Sig Dispense Refill    amLODipine-benazepril (LOTREL) 5-10 MG capsule TAKE 1 CAPSULE BY MOUTH EVERY DAY 90 capsule 1   clonazePAM (KLONOPIN) 0.5 MG tablet Take 1 tablet (0.5 mg total) by mouth 2 (two) times daily. 60 tablet 2   gabapentin (NEURONTIN) 400 MG capsule Take 1 capsule (400 mg total) by mouth 3 (three) times daily. 90 capsule 3   meclizine (ANTIVERT) 25 MG tablet Take 1 tablet (25 mg total) by mouth 3 (three) times daily as needed for dizziness. 90 tablet 1   predniSONE (DELTASONE) 10 MG tablet Take 1 tablet (10 mg total) by mouth daily with breakfast. 30 tablet 0   propranolol (INDERAL) 40 MG tablet Take 0.5 tablets (20 mg total) by mouth daily. 90 tablet 0   No current facility-administered medications for this visit.     Musculoskeletal: Strength &  Muscle Tone:  Unable to assess due to telephone visit Gait & Station:  Unable to assess due to telephone visit Patient leans: N/A  Psychiatric Specialty Exam: Review of Systems  There were no vitals taken for this visit.There is no height or weight on file to calculate BMI.  General Appearance: Unable to assess due to telephone visit  Eye Contact:    Unable to assess due to telephone visit  Speech:  Clear and Coherent and Normal Rate  Volume:  Normal  Mood:  Anxious and Depressed  Affect:  Appropriate and Congruent  Thought Process:  Coherent, Goal Directed, and Linear  Orientation:  Full (Time, Place, and Person)  Thought Content: WDL and Logical   Suicidal Thoughts:  Yes.  without intent/plan  Homicidal Thoughts:  No  Memory:  Immediate;   Good Recent;   Good Remote;   Good  Judgement:  Good  Insight:  Good  Psychomotor Activity:    Unable to assess due to telephone visit  Concentration:  Concentration: Good and Attention Span: Good  Recall:  Good  Fund of Knowledge: Good  Language: Good  Akathisia:     Unable to assess due to telephone visit  Handed:  Right  AIMS (if indicated): not done  Assets:  Communication Skills Desire for  Improvement Financial Resources/Insurance Housing Intimacy Physical Health Social Support  ADL's:  Intact  Cognition: WNL  Sleep:  Good   Screenings: GAD-7    Flowsheet Row Video Visit from 07/13/2021 in [facility] Video Visit from 04/27/2021 in [facility]  Total GAD-7 Score 16 21      PHQ2-9    Flowsheet Row Video Visit from 07/13/2021 in [facility] Video Visit from 04/27/2021 in [facility] from 04/16/2020 in [facility]  PHQ-2 Total Score 3 2 1   PHQ-9 Total Score 13 12 --      Flowsheet Row Video Visit from 07/13/2021 in [facility] Video Visit from 04/27/2021 in [facility] Error: Q7 should not be populated when Q6 is No Error: Q7 should not be populated when Q6 is No        Assessment and Plan: Patient endorses depression and increased anxiety. Today she is agreeable to increase Gabapentin 300 mg TID to 400 mg TID to help manage anxiety. She will continue all other medications as prescribed. Provider discussed reducing Klonopin at next visit.   1. Anxiety  Increased- gabapentin (NEURONTIN) 400 MG capsule; Take 1 capsule (400 mg total) by mouth 3 (three) times daily.  Dispense: 90 capsule; Refill: 3 Continue- clonazePAM (KLONOPIN) 0.5 MG tablet; Take 1 tablet (0.5 mg total) by mouth 2 (two) times daily.  Dispense: 60 tablet; Refill: 2  2. Bipolar 2 disorder (HCC)  Increased- gabapentin (NEURONTIN) 400 MG capsule; Take 1 capsule (400 mg total) by mouth 3 (three) times daily.  Dispense: 90 capsule; Refill: 3    Follow-up in 3 months Follow-up with therapy  Salley Slaughter, NP 07/13/2021, 4:39 PM

## 2021-07-18 ENCOUNTER — Encounter: Payer: Self-pay | Admitting: Physician Assistant

## 2021-07-18 ENCOUNTER — Other Ambulatory Visit: Payer: Self-pay | Admitting: Family Medicine

## 2021-07-18 ENCOUNTER — Telehealth: Payer: 59 | Admitting: Nurse Practitioner

## 2021-07-18 DIAGNOSIS — T50905A Adverse effect of unspecified drugs, medicaments and biological substances, initial encounter: Secondary | ICD-10-CM

## 2021-07-18 DIAGNOSIS — R195 Other fecal abnormalities: Secondary | ICD-10-CM

## 2021-07-18 DIAGNOSIS — I10 Essential (primary) hypertension: Secondary | ICD-10-CM

## 2021-07-18 MED ORDER — LOPERAMIDE HCL 2 MG PO TABS: 2.0000 mg | ORAL_TABLET | Freq: Four times a day (QID) | ORAL | 0 refills | Status: AC | PRN

## 2021-07-18 NOTE — Progress Notes (Signed)
Virtual Visit Consent   Sue Jackson, you are scheduled for a virtual visit with a [facility] provider today.     Just as with appointments in the office, your consent must be obtained to participate.  Your consent will be active for this visit and any virtual visit you may have with one of our providers in the next 365 days.     If you have a MyChart account, a copy of this consent can be sent to you electronically.  All virtual visits are billed to your insurance company just like a traditional visit in the office.    As this is a virtual visit, video technology does not allow for your provider to perform a traditional examination.  This may limit your provider's ability to fully assess your condition.  If your provider identifies any concerns that need to be evaluated in person or the need to arrange testing (such as labs, EKG, etc.), we will make arrangements to do so.     Although advances in technology are sophisticated, we cannot ensure that it will always work on either your end or our end.  If the connection with a video visit is poor, the visit may have to be switched to a telephone visit.  With either a video or telephone visit, we are not always able to ensure that we have a secure connection.     I need to obtain your verbal consent now.   Are you willing to proceed with your visit today?    Sue Jackson has provided verbal consent on 07/18/2021 for a virtual visit (video or telephone).   Gildardo Pounds, NP   Date: 07/18/2021 2:15 PM   Virtual Visit via Video Note   I, Gildardo Pounds, connected with  Sue Jackson  ([id number], 1981/03/20) on 07/18/21 at  2:15 PM EST by a video-enabled telemedicine application and verified that I am speaking with the correct person using two identifiers.  Location: Patient: Virtual Visit Location Patient: Home Provider: Virtual Visit Location Provider: Home Office   I discussed the limitations of  evaluation and management by telemedicine and the availability of in person appointments. The patient expressed understanding and agreed to proceed.    History of Present Illness: Sue Jackson is a 40 y.o. who identifies as a female who was assigned female at birth, and is being seen today for medication side effect.  HPI:  Patient states she had a recent increase in the dosage of her gabapentin and has been experiencing GI upset specifically diarrhea.she states the same symptoms occurred when she initially took gabapentin at a lower dose and she feels the symptoms will subside in a few days and with previous prescription.  She does not want to stop her gabapentin however is requesting a letter to excuse her from work tonight due to the frequency and consistency of her stools.     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   Bipolar 2 disorder ([place]) 11/16/2019   Prediabetes 08/15/2018   Anxiety 02/06/2018   Low back pain radiating to right lower extremity 07/06/2017   Upper back pain, chronic 07/06/2017   Hypertension, essential, benign 03/11/2017   Bipolar disorder with depression ([place]) 03/11/2017   Severe obesity (BMI 35.0-35.9 with comorbidity) ([place]) 03/11/2017    Allergies: No Known Allergies  Medications:  Current Outpatient Medications:    loperamide (IMODIUM A-D) 2 MG tablet, Take 1 tablet (2 mg total) by mouth 4 (four) times daily as needed for diarrhea or loose stools., Disp: 30 tablet, Rfl: 0   amLODipine-benazepril (LOTREL) 5-10 MG capsule, TAKE 1 CAPSULE BY MOUTH EVERY DAY, Disp: 90 capsule, Rfl: 1   clonazePAM (KLONOPIN) 0.5 MG tablet, Take 1 tablet (0.5 mg total) by mouth 2 (two) times daily., Disp: 60 tablet, Rfl: 2   gabapentin (NEURONTIN) 400 MG capsule, Take 1 capsule (400 mg total) by mouth 3 (three) times daily.,  Disp: 90 capsule, Rfl: 3   meclizine (ANTIVERT) 25 MG tablet, Take 1 tablet (25 mg total) by mouth 3 (three) times daily as needed for dizziness., Disp: 90 tablet, Rfl: 1   predniSONE (DELTASONE) 10 MG tablet, Take 1 tablet (10 mg total) by mouth daily with breakfast., Disp: 30 tablet, Rfl: 0   propranolol (INDERAL) 40 MG tablet, Take 0.5 tablets (20 mg total) by mouth daily., Disp: 90 tablet, Rfl: 0  Observations/Objective: Patient is well-developed, well-nourished in no acute distress.  Resting comfortably at home.  Head is normocephalic, atraumatic.  No labored breathing.  Speech is clear and coherent with logical content.  Patient is alert and oriented at baseline.    Assessment and Plan: 1. Medication side effect, initial encounter  2. Loose stools - loperamide (IMODIUM A-D) 2 MG tablet; Take 1 tablet (2 mg total) by mouth 4 (four) times daily as needed for diarrhea or loose stools.  Dispense: 30 tablet; Refill: 0   Follow Up Instructions: I discussed the assessment and treatment plan with the patient. The patient was provided an opportunity to ask questions and all were answered. The patient agreed with the plan and demonstrated an understanding of the instructions.  A copy of instructions were sent to the patient via MyChart unless otherwise noted below.     The patient was advised to call back or seek an in-person evaluation if the symptoms worsen or if the condition fails to improve as anticipated.  Time:  I spent 10 minutes with the patient via telehealth technology discussing the above problems/concerns.    Gildardo Pounds, NP

## 2021-07-18 NOTE — Patient Instructions (Signed)
  [name], thank you for joining Gildardo Pounds, NP for today's virtual visit.  While this provider is not your primary care provider (PCP), if your PCP is located in our provider database this encounter information will be shared with them immediately following your visit.  Consent: (Patient) Sue Jackson provided verbal consent for this virtual visit at the beginning of the encounter.  Current Medications:  Current Outpatient Medications:    loperamide (IMODIUM A-D) 2 MG tablet, Take 1 tablet (2 mg total) by mouth 4 (four) times daily as needed for diarrhea or loose stools., Disp: 30 tablet, Rfl: 0   amLODipine-benazepril (LOTREL) 5-10 MG capsule, TAKE 1 CAPSULE BY MOUTH EVERY DAY, Disp: 90 capsule, Rfl: 1   clonazePAM (KLONOPIN) 0.5 MG tablet, Take 1 tablet (0.5 mg total) by mouth 2 (two) times daily., Disp: 60 tablet, Rfl: 2   gabapentin (NEURONTIN) 400 MG capsule, Take 1 capsule (400 mg total) by mouth 3 (three) times daily., Disp: 90 capsule, Rfl: 3   meclizine (ANTIVERT) 25 MG tablet, Take 1 tablet (25 mg total) by mouth 3 (three) times daily as needed for dizziness., Disp: 90 tablet, Rfl: 1   predniSONE (DELTASONE) 10 MG tablet, Take 1 tablet (10 mg total) by mouth daily with breakfast., Disp: 30 tablet, Rfl: 0   propranolol (INDERAL) 40 MG tablet, Take 0.5 tablets (20 mg total) by mouth daily., Disp: 90 tablet, Rfl: 0   Medications ordered in this encounter:  Meds ordered this encounter  Medications   loperamide (IMODIUM A-D) 2 MG tablet    Sig: Take 1 tablet (2 mg total) by mouth 4 (four) times daily as needed for diarrhea or loose stools.    Dispense:  30 tablet    Refill:  0    Order Specific Question:   Supervising Provider    Answer:   Sabra Heck, BRIAN [[id number]]     *If you need refills on other medications prior to your next appointment, please contact your pharmacy*  Follow-Up: Call back or seek an in-person evaluation if the symptoms worsen or if  the condition fails to improve as anticipated.  Other Instructions Work note in my chart   If you have been instructed to have an in-person evaluation today at a local Urgent Care facility, please use the link below. It will take you to a list of all of our available [facility], including address, phone number and hours of operation. Please do not delay care.  [facility]  If you or a family member do not have a primary care provider, use the link below to schedule a visit and establish care. When you choose a [facility] primary care physician or advanced practice provider, you gain a long-term partner in health. Find a Primary Care Provider  Learn more about [facility]'s in-office and virtual care options: [facility]

## 2021-07-29 ENCOUNTER — Encounter: Payer: Self-pay | Admitting: Family Medicine

## 2021-07-29 DIAGNOSIS — R42 Dizziness and giddiness: Secondary | ICD-10-CM

## 2021-08-06 ENCOUNTER — Encounter: Payer: Self-pay | Admitting: Neurology

## 2021-08-06 ENCOUNTER — Telehealth (HOSPITAL_COMMUNITY): Payer: Self-pay | Admitting: *Deleted

## 2021-08-06 NOTE — Telephone Encounter (Signed)
Patient called left voice message for provider  stating medication concerns

## 2021-08-06 NOTE — Telephone Encounter (Signed)
Patient notes that she has been increasingly dizzy. She notes that she has struggled with dizziness for years and has been diagnosed with vestibular migraines. She asked provider if Gabapentin could contribute to her dizziness. Provider informed patient that gabapentin could cause dizziness but reminded patient that she was complaining of dizziness prior to increasing or starting gabapentin. She notes that she does have a follow up with neurology. Provider informed patient that if she want to reduce gabapentin it could be done. She however notes that its effective and request that it not be adjusted. No other concerns noted at this time.

## 2021-08-11 ENCOUNTER — Telehealth: Payer: Self-pay

## 2021-08-11 NOTE — Telephone Encounter (Signed)
--  Caller states she's feeling light headed as if she's going to pass out and her BP is 149/90 ..feels like her head is hot and like shes going to fall. these symptoms have been going on for a while, states that she went to ED 2 days ago for similar symptoms, but nothing was found.,  08/05/2021 7:18:50 AM Go to ED Now (or PCP triage) Sue Cowden, RN, [facility] - ED  08/11/21 1350: Pt has appt with PCP on 08/12/21

## 2021-08-11 NOTE — Progress Notes (Signed)
ACUTE VISIT Chief Complaint  Patient presents with   Dizziness   HPI: [name] is a 41 y.o. female, who is here today complaining of intermittent episodes of dizziness. We have discussed this problem a few times since 03/2019.  She was last seen on 05/30/20. 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 Refused starting Tamoxifen. Follows with oncologist regularly.  Evaluated in the ED on 07/29/21 for lightheadedness. She would to see a "vestibular system specialist."  She has seen ENT because dizziness on 05/13/2020. According to pt, she was told it was migraine related. She does not always has headache associated with dizziness.  It is exacerbated by skipping meals,stress,anxiety,depression,bright light, and certain foods. Sometimes dizziness is also elicited by movement, in this case it is like movement sensation and associated nausea. Meclizine helps some.  Lab Results  Component Value Date   CREATININE 0.77 06/05/2020   BUN 18 06/05/2020   NA 134 (L) 06/05/2020   K 4.2 06/05/2020   CL 104 06/05/2020   CO2 19 (L) 06/05/2020   Lab Results  Component Value Date   WBC 11.1 (H) 06/05/2020   HGB 14.6 06/05/2020   HCT 45.1 06/05/2020   MCV 81.6 06/05/2020   PLT 402 (H) 06/05/2020   She would like to have an abdominal MRI because she is "hungry all the time" and gaining wt.  Negative for abdominal pain,vomiting, changes in bowel habits, blood in stool or melena.  Depression,anxiety,and bipolar disorder: Following with psychiatrist regularly. She is on Gabapentin,clonazepam, and fluoxetine. Prediabetes, last HgA1C was 6.4 in 01/2020. Negative for polyuria and polydipsia.  HTN: BP sometiems elevated when she is under stress and daily activities at work.  Most of the time BP < 140/90. 149/93 x 1 after work. She is on Propranolol 40 mg 1/2 tab daily and  Amlodipine-benazepril 5-10 mg daily. Negative for chest pain, dyspnea, claudication, focal weakness, or edema. Occasionally blurry vision.  Review of Systems  Constitutional:  Positive for fatigue. Negative for activity change, appetite change and fever.  HENT:  Negative for mouth sores, nosebleeds, sore throat and trouble swallowing.   Eyes:  Negative for pain and redness.  Respiratory:  Negative for cough and wheezing.   Endocrine: Negative for cold intolerance and heat intolerance.  Genitourinary:  Negative for decreased urine volume and hematuria.  Musculoskeletal:  Negative for gait problem and myalgias.  Skin:  Negative for pallor and rash.  Neurological:  Positive for headaches. Negative for syncope, facial asymmetry and weakness.  Hematological:  Negative for adenopathy. Does not bruise/bleed easily.  Psychiatric/Behavioral:  Negative for confusion. The patient is nervous/anxious.   Rest see pertinent positives and negatives per HPI.  Current Outpatient Medications on File Prior to Visit  Medication Sig Dispense Refill   amLODipine-benazepril (LOTREL) 5-10 MG capsule TAKE 1 CAPSULE BY MOUTH EVERY DAY 90 capsule 0   clonazePAM (KLONOPIN) 0.5 MG tablet Take 1 tablet (0.5 mg total) by mouth 2 (two) times daily. 60 tablet 2   FLUoxetine (PROZAC) 10 MG capsule Take 10 mg by mouth daily.     gabapentin (NEURONTIN) 400 MG capsule Take 1 capsule (400 mg total) by mouth 3 (three) times daily. 90 capsule 3   loperamide (IMODIUM A-D) 2 MG tablet Take 1 tablet (2 mg total) by mouth 4 (four) times daily as needed for diarrhea or loose stools. 30 tablet 0   meclizine (ANTIVERT) 25 MG tablet Take 1 tablet (  25 mg total) by mouth 3 (three) times daily as needed for dizziness. 90 tablet 1   propranolol (INDERAL) 40 MG tablet Take 0.5 tablets (20 mg total) by mouth daily. 90 tablet 0   No current facility-administered medications on file prior to visit.   Past Medical History:  Diagnosis Date    Anemia 2019-2020   Anxiety    Arthritis    Bipolar disorder ([place]) 2013   Cancer ([place]) 2021   Depression    Family history of breast cancer    Headache 2021   Hypertension    Personal history of radiation therapy    Pre-diabetes    No Known Allergies  Social History   Socioeconomic History   Marital status: Married    Spouse name: Not on file   Number of children: 0   Years of education: Not on file   Highest education level: Not on file  Occupational History   Not on file  Tobacco Use   Smoking status: Former   Smokeless tobacco: Never   Tobacco comments:    social smoker  Vaping Use   Vaping Use: Never used  Substance and Sexual Activity   Alcohol use: Yes    Comment: occasionally   Drug use: Yes    Types: Marijuana   Sexual activity: Yes    Birth control/protection: None  Other Topics Concern   Not on file  Social History Narrative   Not on file   Social Determinants of Health   Financial Resource Strain: Not on file  Food Insecurity: Not on file  Transportation Needs: Not on file  Physical Activity: Not on file  Stress: Not on file  Social Connections: Not on file   Vitals:   08/12/21 1408  BP: 120/70  Pulse: 70  Resp: 16  SpO2: 99%   Wt Readings from Last 3 Encounters:  08/12/21 238 lb (108 kg)  09/04/20 249 lb 4.8 oz (113.1 kg)  06/12/20 225 lb (102.1 kg)   Body mass index is 37.28 kg/m.  Physical Exam Vitals and nursing note reviewed.  Constitutional:      General: She is not in acute distress.    Appearance: She is well-developed.  HENT:     Head: Normocephalic and atraumatic.     Mouth/Throat:     Mouth: Mucous membranes are moist.     Pharynx: Oropharynx is clear.  Eyes:     Conjunctiva/sclera: Conjunctivae normal.  Cardiovascular:     Rate and Rhythm: Normal rate and regular rhythm.     Pulses:          Dorsalis pedis pulses are 2+ on the right side and 2+ on the left side.     Heart sounds: No murmur heard. Pulmonary:      Effort: Pulmonary effort is normal. No respiratory distress.     Breath sounds: Normal breath sounds.  Abdominal:     Palpations: Abdomen is soft. There is no hepatomegaly or mass.     Tenderness: There is no abdominal tenderness.  Lymphadenopathy:     Cervical: No cervical adenopathy.  Skin:    General: Skin is warm.     Findings: No erythema or rash.  Neurological:     General: No focal deficit present.     Mental Status: She is alert and oriented to person, place, and time.     Cranial Nerves: No cranial nerve deficit.     Gait: Gait normal.  Psychiatric:  Mood and Affect: Mood is anxious.     Comments: Well groomed, good eye contact.   ASSESSMENT AND PLAN:  Ms.Jessey was seen today for dizziness.  Diagnoses and all orders for this visit: Lab Results  Component Value Date   HGBA1C 6.4 08/12/2021   Prediabetes HgA1C has been stable at 6.4. Encouraged consistency with a healthy life style for diabetes prevention. We discussed pharmacologic options and some side effects, she prefers not to add medications at this time.  Hypertension, essential, benign Today BP is adequately controlled. We discussed possible exacerbating factors, so BP can be elevated during episodes of stress and physical activity but as far as BP is in general < 140/90, she can continue same dose of Amlodipine-Benazepril and Propranolol. Low salt/DASH diet recommended. Continue monitoring BP with appropriate technique. Recommend arranging appt with her eye care provider.  Dizziness This is a chronic problem, some episodes of dizziness are suggestive of vertigo. We discussed possible etiologies, hx and examination do not suggest a serious process. She is interested in "vestibular specialist", if she is interested in a specific provider I will be glad to place referral but referral may need to be placed by ENT.  She has seen ENT and has an appt with neurologist. Fall/injury prevention discussed.  Her job does not involve working at [employer]. Brain MRI in 06/2020 was normal.  Severe obesity (BMI 35.0-35.9 with comorbidity) ([place]) We discussed benefits of wt loss as well as adverse effects of obesity. Consistency with healthy diet and physical activity encouraged.  Calorie count is important, recommend having healthy snacks available to reach when she feels hungry (cucumbers,carrots,celery). Meals prep will also help. She would like referral to nutritionist, which her health insurance may not cover. She agrees with referral to [facility] loss clinic.  Malignant neoplasm of right female breast, unspecified estrogen receptor status, unspecified site of breast [facility]) She is following with oncologist.  Bipolar disorder in partial remission, most recent episode unspecified type [facility]) Following with psychiatrist.  Anxiety disorder, unspecified type Problem does not seem to be well controlled. This seems to be aggravating some of above problems. She is on Fluoxetine,Gabapentin,and Clonazepam. Continue following with psychiatrist.  I spent a total of 43 minutes in both face to face and non face to face activities for this visit on the date of this encounter. During this time history was obtained and documented, examination was performed, prior labs/imaging reviewed, and assessment/plan discussed.  Return in about 6 months (around 02/09/2022).  Nahomi Hegner G. [place], MD  [facility]. [place] office.

## 2021-08-12 ENCOUNTER — Ambulatory Visit: Payer: 59 | Admitting: Family Medicine

## 2021-08-12 ENCOUNTER — Encounter: Payer: Self-pay | Admitting: Family Medicine

## 2021-08-12 VITALS — BP 120/70 | HR 70 | Resp 16 | Ht 67.0 in | Wt 238.0 lb

## 2021-08-12 DIAGNOSIS — F419 Anxiety disorder, unspecified: Secondary | ICD-10-CM

## 2021-08-12 DIAGNOSIS — C50911 Malignant neoplasm of unspecified site of right female breast: Secondary | ICD-10-CM

## 2021-08-12 DIAGNOSIS — F3170 Bipolar disorder, currently in remission, most recent episode unspecified: Secondary | ICD-10-CM

## 2021-08-12 DIAGNOSIS — I10 Essential (primary) hypertension: Secondary | ICD-10-CM | POA: Diagnosis not present

## 2021-08-12 DIAGNOSIS — R7303 Prediabetes: Secondary | ICD-10-CM | POA: Diagnosis not present

## 2021-08-12 DIAGNOSIS — Z6835 Body mass index (BMI) 35.0-35.9, adult: Secondary | ICD-10-CM

## 2021-08-12 DIAGNOSIS — R42 Dizziness and giddiness: Secondary | ICD-10-CM

## 2021-08-12 LAB — POCT GLYCOSYLATED HEMOGLOBIN (HGB A1C): HbA1c, POC (prediabetic range): 6.4 % (ref 5.7–6.4)

## 2021-08-12 NOTE — Patient Instructions (Addendum)
A few things to remember from today's visit:  Hypertension, essential, benign  Lightheadedness  Anxiety disorder, unspecified type  Prediabetes  Severe obesity (BMI 35.0-35.9 with comorbidity) ([place]) - Plan: Amb Ref to Medical Weight Management  If you need refills please call your pharmacy. Do not use My Chart to request refills or for acute issues that need immediate attention.   Try to count calories and keep it at max 1700 kcal/day. Fall precautions. Keep appt with neuro.  Please be sure medication list is accurate. If a new problem present, please set up appointment sooner than planned today.

## 2021-08-13 DIAGNOSIS — C50911 Malignant neoplasm of unspecified site of right female breast: Secondary | ICD-10-CM | POA: Insufficient documentation

## 2021-08-21 ENCOUNTER — Telehealth: Payer: 59 | Admitting: Physician Assistant

## 2021-08-21 DIAGNOSIS — J069 Acute upper respiratory infection, unspecified: Secondary | ICD-10-CM | POA: Diagnosis not present

## 2021-08-21 MED ORDER — BENZONATATE 100 MG PO CAPS
100.0000 mg | ORAL_CAPSULE | Freq: Three times a day (TID) | ORAL | 0 refills | Status: DC | PRN
Start: 2021-08-21 — End: 2021-12-16

## 2021-08-21 MED ORDER — IPRATROPIUM BROMIDE 0.03 % NA SOLN: 2.0000 | Freq: Two times a day (BID) | NASAL | 0 refills | Status: AC

## 2021-08-21 NOTE — Progress Notes (Signed)
Virtual Visit Consent   Sue Jackson, you are scheduled for a virtual visit with a [facility] provider today.     Just as with appointments in the office, your consent must be obtained to participate.  Your consent will be active for this visit and any virtual visit you may have with one of our providers in the next 365 days.     If you have a MyChart account, a copy of this consent can be sent to you electronically.  All virtual visits are billed to your insurance company just like a traditional visit in the office.    As this is a virtual visit, video technology does not allow for your provider to perform a traditional examination.  This may limit your provider's ability to fully assess your condition.  If your provider identifies any concerns that need to be evaluated in person or the need to arrange testing (such as labs, EKG, etc.), we will make arrangements to do so.     Although advances in technology are sophisticated, we cannot ensure that it will always work on either your end or our end.  If the connection with a video visit is poor, the visit may have to be switched to a telephone visit.  With either a video or telephone visit, we are not always able to ensure that we have a secure connection.     I need to obtain your verbal consent now.   Are you willing to proceed with your visit today?    [name] has provided verbal consent on 08/21/2021 for a virtual visit (video or telephone).   Mar Daring, PA-C   Date: 08/21/2021 9:25 AM   Virtual Visit via Video Note   I, Mar Daring, connected with  Sue Jackson  ([id number], 1981/01/28) on 08/21/21 at  9:15 AM EST by a video-enabled telemedicine application and verified that I am speaking with the correct person using two identifiers.  Location: Patient: Virtual Visit Location Patient: Home Provider: Virtual Visit Location Provider: Home Office   I discussed the limitations of  evaluation and management by telemedicine and the availability of in person appointments. The patient expressed understanding and agreed to proceed.    History of Present Illness: Sue Jackson is a 41 y.o. who identifies as a female who was assigned female at birth, and is being seen today for URI symptoms.  HPI: URI  This is a new problem. The current episode started yesterday. The problem has been rapidly worsening. There has been no fever. Associated symptoms include congestion, coughing, headaches, rhinorrhea, sinus pain, sneezing and a sore throat. Pertinent negatives include no diarrhea, ear pain, nausea, plugged ear sensation or vomiting. Associated symptoms comments: myalgias. She has tried nothing for the symptoms. The treatment provided no relief.     Problems:  Patient Active Problem List   Diagnosis Date Noted   Malignant neoplasm of right female breast, unspecified estrogen receptor status, unspecified site of breast ([place]) 08/13/2021   Genetic testing 08/20/2020   Family history of breast cancer    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   Bipolar 2 disorder ([place]) 11/16/2019   Prediabetes 08/15/2018   Anxiety 02/06/2018   Low back pain radiating to right lower extremity 07/06/2017   Upper back pain, chronic 07/06/2017   Hypertension, essential, benign 03/11/2017   Bipolar disorder with depression ([place]) 03/11/2017   Severe obesity (BMI 35.0-35.9 with comorbidity) ([place]) 03/11/2017    Allergies:  No Known Allergies Medications:  Current Outpatient Medications:    benzonatate (TESSALON) 100 MG capsule, Take 1 capsule (100 mg total) by mouth 3 (three) times daily as needed., Disp: 30 capsule, Rfl: 0   ipratropium (ATROVENT) 0.03 % nasal spray, Place 2 sprays into both nostrils every 12 (twelve) hours., Disp: 30 mL, Rfl: 0   amLODipine-benazepril (LOTREL) 5-10 MG capsule, TAKE 1 CAPSULE BY MOUTH EVERY DAY, Disp: 90 capsule, Rfl: 0   clonazePAM  (KLONOPIN) 0.5 MG tablet, Take 1 tablet (0.5 mg total) by mouth 2 (two) times daily., Disp: 60 tablet, Rfl: 2   FLUoxetine (PROZAC) 10 MG capsule, Take 10 mg by mouth daily., Disp: , Rfl:    gabapentin (NEURONTIN) 400 MG capsule, Take 1 capsule (400 mg total) by mouth 3 (three) times daily., Disp: 90 capsule, Rfl: 3   loperamide (IMODIUM A-D) 2 MG tablet, Take 1 tablet (2 mg total) by mouth 4 (four) times daily as needed for diarrhea or loose stools., Disp: 30 tablet, Rfl: 0   meclizine (ANTIVERT) 25 MG tablet, Take 1 tablet (25 mg total) by mouth 3 (three) times daily as needed for dizziness., Disp: 90 tablet, Rfl: 1   propranolol (INDERAL) 40 MG tablet, Take 0.5 tablets (20 mg total) by mouth daily., Disp: 90 tablet, Rfl: 0  Observations/Objective: Patient is well-developed, well-nourished in no acute distress.  Resting comfortably at home.  Head is normocephalic, atraumatic.  No labored breathing.  Speech is clear and coherent with logical content.  Patient is alert and oriented at baseline.    Assessment and Plan: 1. Viral URI with cough - ipratropium (ATROVENT) 0.03 % nasal spray; Place 2 sprays into both nostrils every 12 (twelve) hours.  Dispense: 30 mL; Refill: 0 - benzonatate (TESSALON) 100 MG capsule; Take 1 capsule (100 mg total) by mouth 3 (three) times daily as needed.  Dispense: 30 capsule; Refill: 0  - Add Ipratropium bromide for nasal congestion and drainage - Tessalon perles for cough - Continue OTC symptomatic management of choice - Push fluids - Humidifier and steam treatments - Rest - Seek in person evaluation if symptoms worsen or fail to improve  Follow Up Instructions: I discussed the assessment and treatment plan with the patient. The patient was provided an opportunity to ask questions and all were answered. The patient agreed with the plan and demonstrated an understanding of the instructions.  A copy of instructions were sent to the patient via MyChart unless  otherwise noted below.   The patient was advised to call back or seek an in-person evaluation if the symptoms worsen or if the condition fails to improve as anticipated.  Time:  I spent 10 minutes with the patient via telehealth technology discussing the above problems/concerns.    Mar Daring, PA-C

## 2021-08-21 NOTE — Patient Instructions (Signed)
Sue Jackson, thank you for joining Mar Daring, PA-C for today's virtual visit.  While this provider is not your primary care provider (PCP), if your PCP is located in our provider database this encounter information will be shared with them immediately following your visit.  Consent: (Patient) Sue Jackson provided verbal consent for this virtual visit at the beginning of the encounter.  Current Medications:  Current Outpatient Medications:    benzonatate (TESSALON) 100 MG capsule, Take 1 capsule (100 mg total) by mouth 3 (three) times daily as needed., Disp: 30 capsule, Rfl: 0   ipratropium (ATROVENT) 0.03 % nasal spray, Place 2 sprays into both nostrils every 12 (twelve) hours., Disp: 30 mL, Rfl: 0   amLODipine-benazepril (LOTREL) 5-10 MG capsule, TAKE 1 CAPSULE BY MOUTH EVERY DAY, Disp: 90 capsule, Rfl: 0   clonazePAM (KLONOPIN) 0.5 MG tablet, Take 1 tablet (0.5 mg total) by mouth 2 (two) times daily., Disp: 60 tablet, Rfl: 2   FLUoxetine (PROZAC) 10 MG capsule, Take 10 mg by mouth daily., Disp: , Rfl:    gabapentin (NEURONTIN) 400 MG capsule, Take 1 capsule (400 mg total) by mouth 3 (three) times daily., Disp: 90 capsule, Rfl: 3   loperamide (IMODIUM A-D) 2 MG tablet, Take 1 tablet (2 mg total) by mouth 4 (four) times daily as needed for diarrhea or loose stools., Disp: 30 tablet, Rfl: 0   meclizine (ANTIVERT) 25 MG tablet, Take 1 tablet (25 mg total) by mouth 3 (three) times daily as needed for dizziness., Disp: 90 tablet, Rfl: 1   propranolol (INDERAL) 40 MG tablet, Take 0.5 tablets (20 mg total) by mouth daily., Disp: 90 tablet, Rfl: 0   Medications ordered in this encounter:  Meds ordered this encounter  Medications   ipratropium (ATROVENT) 0.03 % nasal spray    Sig: Place 2 sprays into both nostrils every 12 (twelve) hours.    Dispense:  30 mL    Refill:  0    Order Specific Question:   Supervising Provider    Answer:   MILLER, BRIAN [[id number]]    benzonatate (TESSALON) 100 MG capsule    Sig: Take 1 capsule (100 mg total) by mouth 3 (three) times daily as needed.    Dispense:  30 capsule    Refill:  0    Order Specific Question:   Supervising Provider    Answer:   Sue Jackson, Sue Jackson     *If you need refills on other medications prior to your next appointment, please contact your pharmacy*  Follow-Up: Call back or seek an in-person evaluation if the symptoms worsen or if the condition fails to improve as anticipated.  Other Instructions Viral Respiratory Infection A respiratory infection is an illness that affects part of the respiratory system, such as the lungs, nose, or throat. A respiratory infection that is caused by a virus is called a viral respiratory infection. Common types of viral respiratory infections include: A cold. The flu (influenza). A respiratory syncytial virus (RSV) infection. What are the causes? This condition is caused by a virus. The virus may spread through contact with droplets or direct contact with infected people or their mucus or secretions. The virus may spread from person to person (is contagious). What are the signs or symptoms? Symptoms of this condition include: A stuffy or runny nose. A sore throat or cough. Shortness of breath or difficulty breathing. Yellow or green mucus (sputum). Other symptoms may include: A fever. Sweating or chills. Fatigue. Achy muscles. A  headache. How is this diagnosed? This condition may be diagnosed based on: Your symptoms. A physical exam. Testing of secretions from the nose or throat. Chest X-ray. How is this treated? This condition may be treated with medicines, such as: Antiviral medicine. This may shorten the length of time a person has symptoms. Expectorants. These make it easier to cough up mucus. Decongestant nasal sprays. Acetaminophen or NSAIDs, such as ibuprofen, to relieve fever and pain. Antibiotic medicines are not prescribed for  viral infections.This is because antibiotics are designed to kill bacteria. They do not kill viruses. Follow these instructions at home: Managing pain and congestion Take over-the-counter and prescription medicines only as told by your health care provider. If you have a sore throat, gargle with a mixture of salt and water 3-4 times a day or as needed. To make salt water, completely dissolve -1 tsp (3-6 g) of salt in 1 cup (237 mL) of warm water. Use nose drops made from salt water to ease congestion and soften raw skin around your nose. Take 2 tsp (10 mL) of honey at bedtime to lessen coughing at night. Do not give honey to children who are younger than 1 year. Drink enough fluid to keep your urine pale yellow. This helps prevent dehydration and helps loosen up mucus. General instructions  Rest as much as possible. Do not drink alcohol. Do not use any products that contain nicotine or tobacco. These products include cigarettes, chewing tobacco, and vaping devices, such as e-cigarettes. If you need help quitting, ask your health care provider. Keep all follow-up visits. This is important. How is this prevented?   Get an annual flu shot. You may get the flu shot in late summer, fall, or winter. Ask your health care provider when you should get your flu shot. Avoid spreading your infection to other people. If you are sick: Wash your hands with soap and water often, especially after you cough or sneeze. Wash for at least 20 seconds. If soap and water are not available, use alcohol-based hand sanitizer. Cover your mouth when you cough. Cover your nose and mouth when you sneeze. Do not share cups or eating utensils. Clean commonly used objects often. Clean commonly touched surfaces. Stay home from work or school as told by your health care provider. Avoid contact with people who are sick during cold and flu season. This is generally fall and winter. Contact a health care provider if: Your  symptoms last for 10 days or longer. Your symptoms get worse over time. You have severe sinus pain in your face or forehead. The glands in your jaw or neck become very swollen. You have shortness of breath. Get help right away if you: Feel pain or pressure in your chest. Have trouble breathing. Faint or feel like you will faint. Have severe and persistent vomiting. Feel confused or disoriented. These symptoms may represent a serious problem that is an emergency. Do not wait to see if the symptoms will go away. Get medical help right away. Call your local emergency services (911 in the [place] Do not drive yourself to the hospital. Summary A respiratory infection is an illness that affects part of the respiratory system, such as the lungs, nose, or throat. A respiratory infection that is caused by a virus is called a viral respiratory infection. Common types of viral respiratory infections include a cold, influenza, and respiratory syncytial virus (RSV) infection. Symptoms of this condition include a stuffy or runny nose, cough, fatigue, achy muscles, sore throat,  and fevers or chills. Antibiotic medicines are not prescribed for viral infections. This is because antibiotics are designed to kill bacteria. They are not effective against viruses. This information is not intended to replace advice given to you by your health care provider. Make sure you discuss any questions you have with your health care provider. Document Revised: 10/30/2020 Document Reviewed: 10/30/2020 Elsevier Patient Education  2022 [employer].    If you have been instructed to have an in-person evaluation today at a local Urgent Care facility, please use the link below. It will take you to a list of all of our available [facility], including address, phone number and hours of operation. Please do not delay care.  [facility]  If you or a family member do not have a primary care provider, use  the link below to schedule a visit and establish care. When you choose a [facility] primary care physician or advanced practice provider, you gain a long-term partner in health. Find a Primary Care Provider  Learn more about [facility] in-office and virtual care options: [facility]

## 2021-08-25 ENCOUNTER — Ambulatory Visit (HOSPITAL_COMMUNITY): Payer: 59 | Admitting: Clinical

## 2021-08-25 ENCOUNTER — Encounter (HOSPITAL_COMMUNITY): Payer: Self-pay

## 2021-09-02 ENCOUNTER — Ambulatory Visit (INDEPENDENT_AMBULATORY_CARE_PROVIDER_SITE_OTHER): Payer: 59 | Admitting: Clinical

## 2021-09-02 DIAGNOSIS — F3181 Bipolar II disorder: Secondary | ICD-10-CM

## 2021-09-04 ENCOUNTER — Inpatient Hospital Stay: Payer: 59 | Attending: Hematology | Admitting: Hematology

## 2021-09-04 ENCOUNTER — Other Ambulatory Visit: Payer: Self-pay

## 2021-09-04 ENCOUNTER — Encounter: Payer: Self-pay | Admitting: Hematology

## 2021-09-04 VITALS — BP 120/88 | HR 66 | Temp 98.0°F | Resp 16 | Wt 240.4 lb

## 2021-09-04 DIAGNOSIS — E669 Obesity, unspecified: Secondary | ICD-10-CM | POA: Diagnosis not present

## 2021-09-04 DIAGNOSIS — I10 Essential (primary) hypertension: Secondary | ICD-10-CM | POA: Diagnosis not present

## 2021-09-04 DIAGNOSIS — D0511 Intraductal carcinoma in situ of right breast: Secondary | ICD-10-CM | POA: Insufficient documentation

## 2021-09-04 DIAGNOSIS — F419 Anxiety disorder, unspecified: Secondary | ICD-10-CM | POA: Insufficient documentation

## 2021-09-04 DIAGNOSIS — Z923 Personal history of irradiation: Secondary | ICD-10-CM | POA: Diagnosis not present

## 2021-09-04 DIAGNOSIS — Z79899 Other long term (current) drug therapy: Secondary | ICD-10-CM | POA: Insufficient documentation

## 2021-09-04 NOTE — Progress Notes (Deleted)
[place]   [phone] [phone]   Clinic Follow up Note   Patient Care Team: Martinique, Betty G, MD as PCP - General (Family Medicine) Rockwell Germany, RN as Oncology Nurse Navigator Mauro Kaufmann, RN as Oncology Nurse Navigator Truitt Merle, MD as Consulting Physician (Hematology) Coralie Keens, MD as Consulting Physician (General Surgery) Kyung Rudd, MD as Consulting Physician (Radiation Oncology)  Date of Service:  09/04/2021  CHIEF COMPLAINT: F/u of right breast DCIS   SUMMARY OF ONCOLOGIC HISTORY: Oncology History Overview Note  Cancer Staging Ductal carcinoma in situ (DCIS) of right breast Staging form: Breast, AJCC 8th Edition - Clinical stage from 04/16/2020: Stage 0 (cTis (DCIS), cN0, cM0, ER+, PR+, HER2: Not Assessed) - Unsigned    Ductal carcinoma in situ (DCIS) of right breast  04/01/2020 Mammogram   IMPRESSION: 1. Indeterminate 66m right breast calcifications. 2. Probably benign left breast masses x2, one of which may correspond with a complicated cyst at the 4 o'clock retroareolar Position measures 7 x 5 x 2 mm.   04/09/2020 Initial Biopsy   Diagnosis Breast, right, needle core biopsy, upper outer - DUCTAL CARCINOMA IN SITU, HIGH-GRADE WITH FOCAL NECROSIS AND CALCIFICATIONS. SEE NOTE Diagnosis Note DCIS measures 0.2 cm in greatest linear dimension. A breast prognostic profile (ER, PR) is pending and will be reported in an addendum. Dr. PSaralyn Pilarreviewed the case and concurs with the diagnosis. The [place] on 04/10/2020.     PROGNOSTIC INDICATORS Results: IMMUNOHISTOCHEMICAL AND MORPHOMETRIC ANALYSIS PERFORMED MANUALLY Estrogen Receptor: 90%, POSITIVE, MODERATE STAINING INTENSITY Progesterone Receptor: 80%, POSITIVE, STRONG STAINING INTENSITY   04/11/2020 Initial Diagnosis   Ductal carcinoma in situ (DCIS) of right breast   04/22/2020 Breast MRI   IMPRESSION: 1. Expected post  biopsy changes in the anterior UPPER OUTER QUADRANT of the RIGHT breast. 2. Indeterminate irregular mass in the posterior UPPER INNER QUADRANT of the LEFT breast for which MR guided core biopsy is recommended. (Image 36 of series 8)     04/24/2020 Pathology Results   Diagnosis 1. Breast, left, needle core biopsy, upper inner - FIBROADENOMATOID CHANGE. - NO EVIDENCE OF MALIGNANCY. 2. Breast, left, needle core biopsy, lower inner - FIBROCYSTIC CHANGE WITH APOCRINE METAPLASIA. - NO EVIDENCE OF MALIGNANCY. Microscopic Comment 1. -2. [facility]   05/12/2020 Breast MRI   Left Breast MRI  IMPRESSION: 1. Biopsy clip placed at earlier stereotactic biopsy which revealed benign fibroadenoma corresponds to the mass identified within the upper LEFT breast on earlier MRI. As such, no additional MRI-guided biopsy is necessary today. 2. Patient has a known RIGHT breast cancer.   06/12/2020 Surgery   RIGHT BREAST LUMPECTOMY WITH RADIOACTIVE SEED LOCALIZATION by Dr BNinfa Linden    06/12/2020 Pathology Results   FINAL MICROSCOPIC DIAGNOSIS:   A. BREAST, RIGHT, LUMPECTOMY:  - Ductal carcinoma in situ, 0.9 cm.  - Margins not involved.  - DCIS focally 0.3 cm from anterior margin.  - Biopsy site and biopsy clip.    07/23/2020 - 09/11/2020 Radiation Therapy   Adjuvant Radiation with Dr MLisbeth Renshaw   08/19/2020 Genetic Testing   Negative genetic testing.  KIF1B VUS identified.  The [facility]Exp[facility] includes sequencing and rearrangement analysis for the following 77 genes: AIP, ALK, APC*, ATM*, AXIN2, BAP1, BARD1, BLM, BMPR1A, BRCA1*, BRCA2*, BRIP1*, CDC73, CDH1*, CDK4, CDKN1B, CDKN2A, CHEK2*, CTNNA1, DICER1, FANCC, FH, FLCN, GALNT12, KIF1B, LZTR1, MAX,  MET, MLH1*, MSH2*, MSH3, MSH6*, MUTYH*, NBN, NF1*, NF2, NTHL1, PALB2*, PHOX2B, PMS2*, POT1, PRKAR1A, PTCH1, PTEN*, RAD51C*, RAD51D*, RB1, RECQL, RET, SDHA, SDHAF2, SDHB,  SDHC, SDHD, SMAD4, SMARCA4, SMARCB1, SMARCE1, STK11, SUFU, TMEM127, TP53*, TSC1, TSC2, VHL and XRCC2 (sequencing and deletion/duplication); EGFR, EGLN1, HOXB13, KIT, MITF, PDGFRA, POLD1, and POLE (sequencing only); EPCAM and GREM1 (deletion/duplication only). DNA and RNA analyses performed for * genes. The report date is August 19, 2020. °  ° ° ° °CURRENT THERAPY:  °Adjuvant Radiation with Dr Moody 07/23/20-09/11/20 ° °INTERVAL HISTORY:  °Sue Jackson is here for a follow up. She presents to the clinic alone. °She has been tolerating radiation well overall, and is scheduled to complete next week. °Skin irirtation from radiation, no significant bleeding or discharge, or pain. °Mild to moderate fatigue from radiation, able to function well at home. °  °All other systems were reviewed with the patient and are negative. ° °MEDICAL HISTORY:  °Past Medical History:  °Diagnosis Date  ° Anemia 2019-2020  ° Anxiety   ° Arthritis   ° Bipolar disorder (HCC) 2013  ° Cancer (HCC) 2021  ° Depression   ° Family history of breast cancer   ° Headache 2021  ° Hypertension   ° Personal history of radiation therapy   ° Pre-diabetes   ° ° °SURGICAL HISTORY: °Past Surgical History:  °Procedure Laterality Date  ° BREAST BIOPSY Right 04/09/2020  ° BREAST BIOPSY Left 04/24/2020  ° x2  ° BREAST LUMPECTOMY Right 06/12/2020  ° BREAST LUMPECTOMY WITH RADIOACTIVE SEED LOCALIZATION Right 06/12/2020  ° Procedure: RIGHT BREAST LUMPECTOMY WITH RADIOACTIVE SEED LOCALIZATION;  Surgeon: Blackman, Douglas, MD;  Location: [facility] OR;  Service: General;  Laterality: Right;  LMA  ° ° °I have reviewed the social history and family history with the patient and they are unchanged from previous note. ° °ALLERGIES:  has No Known Allergies. ° °MEDICATIONS:  °Current Outpatient Medications  °Medication Sig Dispense Refill  ° amLODipine-benazepril (LOTREL) 5-10 MG capsule TAKE 1 CAPSULE BY MOUTH EVERY DAY 90 capsule 0  ° benzonatate (TESSALON) 100 MG capsule  Take 1 capsule (100 mg total) by mouth 3 (three) times daily as needed. 30 capsule 0  ° clonazePAM (KLONOPIN) 0.5 MG tablet Take 1 tablet (0.5 mg total) by mouth 2 (two) times daily. 60 tablet 2  ° FLUoxetine (PROZAC) 10 MG capsule Take 10 mg by mouth daily.    ° gabapentin (NEURONTIN) 400 MG capsule Take 1 capsule (400 mg total) by mouth 3 (three) times daily. 90 capsule 3  ° ipratropium (ATROVENT) 0.03 % nasal spray Place 2 sprays into both nostrils every 12 (twelve) hours. 30 mL 0  ° loperamide (IMODIUM A-D) 2 MG tablet Take 1 tablet (2 mg total) by mouth 4 (four) times daily as needed for diarrhea or loose stools. 30 tablet 0  ° meclizine (ANTIVERT) 25 MG tablet Take 1 tablet (25 mg total) by mouth 3 (three) times daily as needed for dizziness. 90 tablet 1  ° propranolol (INDERAL) 40 MG tablet Take 0.5 tablets (20 mg total) by mouth daily. 90 tablet 0  ° °No current facility-administered medications for this visit.  ° ° °PHYSICAL EXAMINATION: °ECOG PERFORMANCE STATUS: 1 - Symptomatic but completely ambulatory ° °Vitals:  ° 09/04/21 1117  °BP: 120/88  °Pulse: 66  °Resp: 16  °Temp: 98 °F (36.7 °C)  °SpO2: 100%  ° °Filed Weights  ° 09/04/21 1117  °Weight: 240 lb 7 oz (109.1 kg)  ° ° °GENERAL:alert, no distress and   and comfortable SKIN: skin color, texture, turgor are normal, no rashes or significant lesions EYES: normal, Conjunctiva are pink and non-injected, sclera clear NECK: supple, thyroid normal size, non-tender, without nodularity LYMPH:  no palpable lymphadenopathy in the cervical, axillary  LUNGS: clear to auscultation and percussion with normal breathing effort HEART: regular rate & rhythm and no murmurs and no lower extremity edema ABDOMEN:abdomen soft, non-tender and normal bowel sounds Musculoskeletal:no cyanosis of digits and no clubbing  NEURO: alert & oriented x 3 with fluent speech, no focal motor/sensory deficits Breasts: Breast inspection showed them to be symmetrical with no nipple  discharge. Diffuse skin erythema and mild skin peeling in the right breast and axilla, no ulcers or discharge. Palpation of the left breast and axilla revealed no obvious mass that I could appreciate.   LABORATORY DATA:  I have reviewed the data as listed CBC Latest Ref Rng & Units 06/05/2020 04/16/2020 09/22/2019  WBC 4.0 - 10.5 K/uL 11.1(H) 9.3 12.2(H)  Hemoglobin 12.0 - 15.0 g/dL 14.6 14.0 14.1  Hematocrit 36.0 - 46.0 % 45.1 43.5 43.6  Platelets 150 - 400 K/uL 402(H) 309 338     CMP Latest Ref Rng & Units 06/05/2020 04/16/2020 01/30/2020  Glucose 70 - 99 mg/dL 106(H) 110(H) 115(H)  BUN 6 - 20 mg/dL _0 Creatinine 0.44 - 1.00 mg/dL 0.77 1.11(H) 0.85  Sodium 135 - 145 mmol/L 134(L) 138 134(L)  Potassium 3.5 - 5.1 mmol/L 4.2 4.2 4.9  Chloride 98 - 111 mmol/L 104 105 104  CO2 22 - 32 mmol/L 19(L) 25 27  Calcium 8.9 - 10.3 mg/dL 9.3 8.9 9.0  Total Protein 6.5 - 8.1 g/dL - 7.3 6.7  Total Bilirubin 0.3 - 1.2 mg/dL - 0.3 0.3  Alkaline Phos 38 - 126 U/L - 75 72  AST 15 - 41 U/L - 14(L) 15  ALT 0 - 44 U/L - 27 25      RADIOGRAPHIC STUDIES: I have personally reviewed the radiological images as listed and agreed with the findings in the report. No results found.   ASSESSMENT & PLAN:  Sue Jackson is a 41 y.o. female with   1. Right breast DCIS, High grade, ER+/PR+  -She was diagnosed in 04/2020 with high grade DCIS in her right breast with necrosis and calcifications. Left breast biopsy was benign.  -She underwent right breast lumpectomy with Dr Ninfa Linden on 06/12/20.  -Her DCIS was cured by complete surgical resection. Any form of adjuvant therapy is preventive. -She proceeded with adjuvant Radiation with Dr Lisbeth Renshaw on 07/23/20. She plans to complete on 09/11/20. She is tolerating moderately well -Given her strongly positive ER and PR and premenopausal status, I do recommend antiestrogen therapy with Tamoxifen for 5 years, which decrease her risk of future breast cancer by  ~40%.              -The potential side effects, which includes but not limited to, hot flash, skin and vaginal dryness, slightly increased risk of cardiovascular disease and cataract, small risk of thrombosis and endometrial cancer, were discussed with her in great details. -she has done some research about Tamoxifen and is very concerned about side effects, especially thrombosis and the risk of endometrial cancer.  After lengthy discussion, she declined tamoxifen. -We discussed the breast cancer surveillance after her surgery. She will continue annual screening mammogram, self exams, and breast exam with physican. I discussed the option of additional screening with annual breast MRIs. She is interested. Will start in Feb  f/u once in one year, okay to follow-up with Dr. Blackman and her PCP only from next year  °-Next mammogram in August 2022 °  °  °2. Genetic Testing was negative for pathogenetic testing  °  °  °3. Anxiety/Depression °-She notes to having chronic Anxiety and Depression along with bipolar. She did not tolerate Zoloft or Effexor in the past.  °-She is on Ativan and half tablet Propranolol. Medication is managed by her PCP. She is not seeing a therapist or Psychiatrist.  °  °  °4. HTN, Arthritis in back °-Managed by PCP. Continue medications.  °  °  °PLAN:  °-She will complete radiation next week °-She declined tamoxifen °-Continue breast cancer surveillance, diagnostic mammogram in August 2022 °-Follow-up in 1 year, will order breast MRI on next visit  ° ° ° °No problem-specific Assessment & Plan notes found for this encounter. ° ° °No orders of the defined types were placed in this encounter. ° °All questions were answered. The patient knows to call the clinic with any problems, questions or concerns. No barriers to learning was detected. °The total time spent in the appointment was 30 minutes. ° °  °  , MD °09/04/2021  ° °I, Amoya Bennett, am acting as scribe for  , MD.  ° °I  have reviewed the above documentation for accuracy and completeness, and I agree with the above. °  ° ° ° ° °

## 2021-09-04 NOTE — Progress Notes (Signed)
[place]   Telephone:([phone]) [phone] Fax:(336) [phone]   Clinic Follow up Note   Patient Care Team: Martinique, Betty G, MD as PCP - General (Family Medicine) Rockwell Germany, RN as Oncology Nurse Navigator Mauro Kaufmann, RN as Oncology Nurse Navigator Truitt Merle, MD as Consulting Physician (Hematology) Coralie Keens, MD as Consulting Physician (General Surgery) Kyung Rudd, MD as Consulting Physician (Radiation Oncology)  Date of Service:  09/04/2021  CHIEF COMPLAINT: f/u of right breast DCIS  CURRENT THERAPY:  Surveillance  ASSESSMENT & PLAN:  Sue Jackson is a 41 y.o. female with   1. Right breast DCIS, High grade, ER+/PR+  -diagnosed in 04/2020 with high grade DCIS, s/p right breast lumpectomy with Dr Ninfa Linden on 06/12/20 showing 0.9 cm DCIS. -she received adjuvant radiation under Dr. Lisbeth Renshaw 07/23/20-09/11/20. -she declined prophylactic tamoxifen due to concern for side effects. -most recent mammogram 04/06/21 was benign. -she is clinically doing well. Physical exam shows some breast erythema, R>L. I advised her to keep the area under her breasts dry. -We reviewed breast cancer surveillance. She will continue annual screening mammogram, self exams, and breast exam with physican. She would also like to proceed with annual breast MRI.    2. Genetic Testing was negative for pathogenetic testing     3. Anxiety/Depression -She notes to having chronic Anxiety and Depression along with bipolar. She did not tolerate Zoloft or Effexor in the past.  -She is on Ativan and half tablet Propranolol. Medication is managed by her PCP. She is not seeing a therapist or Psychiatrist.    4. HTN, Arthritis in back, Obesity -Managed by PCP. Continue medications.  -She is interested in losing weight. I provided her with information about the weight management clinic.     PLAN:  -breast MRI next month (09/2021) -mammogram due 03/2022 -f/u open, she will f/u with her  PCP for breast cancer screening. -I gave her the information of weight management clinic, she will call me if she needs referral.   No problem-specific Assessment & Plan notes found for this encounter.   SUMMARY OF ONCOLOGIC HISTORY: Oncology History Overview Note  Cancer Staging Ductal carcinoma in situ (DCIS) of right breast Staging form: Breast, AJCC 8th Edition - Clinical stage from 04/16/2020: Stage 0 (cTis (DCIS), cN0, cM0, ER+, PR+, HER2: Not Assessed) - Unsigned    Ductal carcinoma in situ (DCIS) of right breast  04/01/2020 Mammogram   IMPRESSION: 1. Indeterminate 31m right breast calcifications. 2. Probably benign left breast masses x2, one of which may correspond with a complicated cyst at the 4 o'clock retroareolar Position measures 7 x 5 x 2 mm.   04/09/2020 Initial Biopsy   Diagnosis Breast, right, needle core biopsy, upper outer - DUCTAL CARCINOMA IN SITU, HIGH-GRADE WITH FOCAL NECROSIS AND CALCIFICATIONS. SEE NOTE Diagnosis Note DCIS measures 0.2 cm in greatest linear dimension. A breast prognostic profile (ER, PR) is pending and will be reported in an addendum. Dr. PSaralyn Pilarreviewed the case and concurs with the diagnosis. The [place] notified on 04/10/2020.     PROGNOSTIC INDICATORS Results: IMMUNOHISTOCHEMICAL AND MORPHOMETRIC ANALYSIS PERFORMED MANUALLY Estrogen Receptor: 90%, POSITIVE, MODERATE STAINING INTENSITY Progesterone Receptor: 80%, POSITIVE, STRONG STAINING INTENSITY   04/11/2020 Initial Diagnosis   Ductal carcinoma in situ (DCIS) of right breast   04/22/2020 Breast MRI   IMPRESSION: 1. Expected post biopsy changes in the anterior UPPER OUTER QUADRANT of the RIGHT breast. 2. Indeterminate irregular mass in the posterior UPPER INNER  QUADRANT of the LEFT breast for which MR guided core biopsy is recommended. (Image 36 of series 8)     04/24/2020 Pathology Results   Diagnosis 1. Breast, left, needle core biopsy,  upper inner - FIBROADENOMATOID CHANGE. - NO EVIDENCE OF MALIGNANCY. 2. Breast, left, needle core biopsy, lower inner - FIBROCYSTIC CHANGE WITH APOCRINE METAPLASIA. - NO EVIDENCE OF MALIGNANCY. Microscopic Comment 1. -2. Called to the [facility] on 04/25/20).   05/12/2020 Breast MRI   Left Breast MRI  IMPRESSION: 1. Biopsy clip placed at earlier stereotactic biopsy which revealed benign fibroadenoma corresponds to the mass identified within the upper LEFT breast on earlier MRI. As such, no additional MRI-guided biopsy is necessary today. 2. Patient has a known RIGHT breast cancer.   06/12/2020 Surgery   RIGHT BREAST LUMPECTOMY WITH RADIOACTIVE SEED LOCALIZATION by Dr Ninfa Linden     06/12/2020 Pathology Results   FINAL MICROSCOPIC DIAGNOSIS:   A. BREAST, RIGHT, LUMPECTOMY:  - Ductal carcinoma in situ, 0.9 cm.  - Margins not involved.  - DCIS focally 0.3 cm from anterior margin.  - Biopsy site and biopsy clip.    07/23/2020 - 09/11/2020 Radiation Therapy   Adjuvant Radiation with Dr Lisbeth Renshaw    08/19/2020 Genetic Testing   Negative genetic testing.  KIF1B VUS identified.  The CancerNext-Expanded gene panel offered by [facility] and includes sequencing and rearrangement analysis for the following 77 genes: AIP, ALK, APC*, ATM*, AXIN2, BAP1, BARD1, BLM, BMPR1A, BRCA1*, BRCA2*, BRIP1*, CDC73, CDH1*, CDK4, CDKN1B, CDKN2A, CHEK2*, CTNNA1, DICER1, FANCC, FH, FLCN, GALNT12, KIF1B, LZTR1, MAX, MEN1, MET, MLH1*, MSH2*, MSH3, MSH6*, MUTYH*, NBN, NF1*, NF2, NTHL1, PALB2*, PHOX2B, PMS2*, POT1, PRKAR1A, PTCH1, PTEN*, RAD51C*, RAD51D*, RB1, RECQL, RET, SDHA, SDHAF2, SDHB, SDHC, SDHD, SMAD4, SMARCA4, SMARCB1, SMARCE1, STK11, SUFU, TMEM127, TP53*, TSC1, TSC2, VHL and XRCC2 (sequencing and deletion/duplication); EGFR, EGLN1, HOXB13, KIT, MITF, PDGFRA, POLD1, and POLE (sequencing only); EPCAM and GREM1 (deletion/duplication only). DNA and RNA analyses performed for * genes. The report date is  August 19, 2020.      INTERVAL HISTORY:  Sue Jackson is here for a follow up of DCIS. She was last seen by me a year ago. She presents to the clinic accompanied by a friend. She reports she is doing well overall, no new changes since last visit. She does report continued pains to her breast, as well as itching under the breast.   All other systems were reviewed with the patient and are negative.  MEDICAL HISTORY:  Past Medical History:  Diagnosis Date   Anemia 2019-2020   Anxiety    Arthritis    Bipolar disorder ([place]) 2013   Cancer ([place]) 2021   Depression    Family history of breast cancer    Headache 2021   Hypertension    Personal history of radiation therapy    Pre-diabetes     SURGICAL HISTORY: Past Surgical History:  Procedure Laterality Date   BREAST BIOPSY Right 04/09/2020   BREAST BIOPSY Left 04/24/2020   x2   BREAST LUMPECTOMY Right 06/12/2020   BREAST LUMPECTOMY WITH RADIOACTIVE SEED LOCALIZATION Right 06/12/2020   Procedure: RIGHT BREAST LUMPECTOMY WITH RADIOACTIVE SEED LOCALIZATION;  Surgeon: Coralie Keens, MD;  Location: [facility];  Service: General;  Laterality: Right;  LMA    I have reviewed the social history and family history with the patient and they are unchanged from previous note.  ALLERGIES:  has No Known Allergies.  MEDICATIONS:  Current Outpatient Medications  Medication Sig Dispense Refill  amLODipine-benazepril (LOTREL) 5-10 MG capsule TAKE 1 CAPSULE BY MOUTH EVERY DAY 90 capsule 0   benzonatate (TESSALON) 100 MG capsule Take 1 capsule (100 mg total) by mouth 3 (three) times daily as needed. 30 capsule 0   clonazePAM (KLONOPIN) 0.5 MG tablet Take 1 tablet (0.5 mg total) by mouth 2 (two) times daily. 60 tablet 2   gabapentin (NEURONTIN) 400 MG capsule Take 1 capsule (400 mg total) by mouth 3 (three) times daily. 90 capsule 3   ipratropium (ATROVENT) 0.03 % nasal spray Place 2 sprays into both nostrils every 12 (twelve) hours.  30 mL 0   loperamide (IMODIUM A-D) 2 MG tablet Take 1 tablet (2 mg total) by mouth 4 (four) times daily as needed for diarrhea or loose stools. 30 tablet 0   meclizine (ANTIVERT) 25 MG tablet Take 1 tablet (25 mg total) by mouth 3 (three) times daily as needed for dizziness. 90 tablet 1   propranolol (INDERAL) 40 MG tablet Take 0.5 tablets (20 mg total) by mouth daily. 90 tablet 0   No current facility-administered medications for this visit.    PHYSICAL EXAMINATION: ECOG PERFORMANCE STATUS: 0 - Asymptomatic  Vitals:   09/04/21 1117  BP: 120/88  Pulse: 66  Resp: 16  Temp: 98 F (36.7 C)  SpO2: 100%   Wt Readings from Last 3 Encounters:  09/04/21 240 lb 7 oz (109.1 kg)  08/12/21 238 lb (108 kg)  09/04/20 249 lb 4.8 oz (113.1 kg)     GENERAL:alert, no distress and comfortable SKIN: skin color, texture, turgor are normal, no rashes or significant lesions EYES: normal, Conjunctiva are pink and non-injected, sclera clear  NECK: supple, thyroid normal size, non-tender, without nodularity LYMPH:  no palpable lymphadenopathy in the cervical, axillary  LUNGS: clear to auscultation and percussion with normal breathing effort HEART: regular rate & rhythm and no murmurs and no lower extremity edema NEURO: alert & oriented x 3 with fluent speech, no focal motor/sensory deficits BREAST: (+) mild erythema to b/l breasts, right more than left. No palpable mass, nodules or adenopathy bilaterally. Breast exam benign.   LABORATORY DATA:  I have reviewed the data as listed CBC Latest Ref Rng & Units 06/05/2020 04/16/2020 09/22/2019  WBC 4.0 - 10.5 K/uL 11.1(H) 9.3 12.2(H)  Hemoglobin 12.0 - 15.0 g/dL 14.6 14.0 14.1  Hematocrit 36.0 - 46.0 % 45.1 43.5 43.6  Platelets 150 - 400 K/uL 402(H) 309 338     CMP Latest Ref Rng & Units 06/05/2020 04/16/2020 01/30/2020  Glucose 70 - 99 mg/dL 106(H) 110(H) 115(H)  BUN 6 - 20 mg/dL 18 17 15   Creatinine 0.44 - 1.00 mg/dL 0.77 1.11(H) 0.85  Sodium 135 - 145  mmol/L 134(L) 138 134(L)  Potassium 3.5 - 5.1 mmol/L 4.2 4.2 4.9  Chloride 98 - 111 mmol/L 104 105 104  CO2 22 - 32 mmol/L 19(L) 25 27  Calcium 8.9 - 10.3 mg/dL 9.3 8.9 9.0  Total Protein 6.5 - 8.1 g/dL - 7.3 6.7  Total Bilirubin 0.3 - 1.2 mg/dL - 0.3 0.3  Alkaline Phos 38 - 126 U/L - 75 72  AST 15 - 41 U/L - 14(L) 15  ALT 0 - 44 U/L - 27 25      RADIOGRAPHIC STUDIES: I have personally reviewed the radiological images as listed and agreed with the findings in the report. No results found.    Orders Placed This Encounter  Procedures   MR BREAST BILATERAL W WO CONTRAST INC CAD  Standing Status:   Future    Standing Expiration Date:   09/04/2022    Order Specific Question:   If indicated for the ordered procedure, I authorize the administration of contrast media per Radiology protocol    Answer:   Yes    Order Specific Question:   What is the patient's sedation requirement?    Answer:   No Sedation    Order Specific Question:   Does the patient have a pacemaker or implanted devices?    Answer:   No    Order Specific Question:   Radiology Contrast Protocol - do NOT remove file path    Answer:   [email or web address]    Order Specific Question:   Preferred imaging location?    Answer:   [facility] (table limit-550lbs)   MM DIAG BREAST TOMO BILATERAL    Standing Status:   Future    Standing Expiration Date:   09/04/2022    Order Specific Question:   Reason for Exam (SYMPTOM  OR DIAGNOSIS REQUIRED)    Answer:   screening    Order Specific Question:   Preferred imaging location?    Answer:   [facility]    Order Specific Question:   Release to patient    Answer:   Immediate    Order Specific Question:   Is the patient pregnant?    Answer:   No   All questions were answered. The patient knows to call the clinic with any problems, questions or concerns. No barriers to learning was detected. The total time spent in the appointment  was 25 minutes.     Truitt Merle, MD 09/04/2021   I, Wilburn Mylar, am acting as scribe for Truitt Merle, MD.   I have reviewed the above documentation for accuracy and completeness, and I agree with the above.
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
## 2021-09-05 NOTE — Plan of Care (Signed)
Client was in agreement to the plan.

## 2021-09-12 ENCOUNTER — Other Ambulatory Visit: Payer: Self-pay | Admitting: Physician Assistant

## 2021-09-12 ENCOUNTER — Telehealth: Payer: 59 | Admitting: Physician Assistant

## 2021-09-12 DIAGNOSIS — G43809 Other migraine, not intractable, without status migrainosus: Secondary | ICD-10-CM

## 2021-09-12 DIAGNOSIS — J069 Acute upper respiratory infection, unspecified: Secondary | ICD-10-CM

## 2021-09-12 NOTE — Progress Notes (Signed)
Virtual Visit Consent   Sue Jackson, you are scheduled for a virtual visit with a [facility] provider today.     Just as with appointments in the office, your consent must be obtained to participate.  Your consent will be active for this visit and any virtual visit you may have with one of our providers in the next 365 days.     If you have a MyChart account, a copy of this consent can be sent to you electronically.  All virtual visits are billed to your insurance company just like a traditional visit in the office.    As this is a virtual visit, video technology does not allow for your provider to perform a traditional examination.  This may limit your provider's ability to fully assess your condition.  If your provider identifies any concerns that need to be evaluated in person or the need to arrange testing (such as labs, EKG, etc.), we will make arrangements to do so.     Although advances in technology are sophisticated, we cannot ensure that it will always work on either your end or our end.  If the connection with a video visit is poor, the visit may have to be switched to a telephone visit.  With either a video or telephone visit, we are not always able to ensure that we have a secure connection.     I need to obtain your verbal consent now.   Are you willing to proceed with your visit today?    Brandilynn Taormina has provided verbal consent on 09/12/2021 for a virtual visit (video or telephone).   Sue Jackson, [place]   Date: 09/12/2021 1:42 PM   Virtual Visit via Video Note   I, Sue Jackson, connected with  Sue Jackson  ([id number], 12/28/1980) on 09/12/21 at  1:30 PM EST by a video-enabled telemedicine application and verified that I am speaking with the correct person using two identifiers.  Location: Patient: Virtual Visit Location Patient: Home Provider: Virtual Visit Location Provider: Home Office   I discussed the limitations of evaluation  and management by telemedicine and the availability of in person appointments. The patient expressed understanding and agreed to proceed.    History of Present Illness: Sue Jackson is a 41 y.o. who identifies as a female who was assigned female at birth, and is being seen today for dizziness.  Hx of BPPV and vestibular migraines. Has new patient appointment with Neuro in a week or two.  Typically with her vestibular migraines she has lightheadedness/dizziness. She is currently having this but today she developed light sensitivity and a very mild HA.  Eating and drinking okay. She did end up going longer than usual between meals yesterday and she thinks that may have contributed to her symptoms.   She denies: nausea, vomiting, weakness, changes in vision, severe HA, slurred speech, syncope, chest pain, SOB.  Endorses anxiety. Had recent visit with PCP regarding her sx on 08/12/21.  Taking all medications regularly. Does have HTN but doesn't have working machine.  Prior to our visit she took a meclizine tablet 1 hour ago.  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   Bipolar 2 disorder ([place]) 11/16/2019  Prediabetes 08/15/2018   Anxiety 02/06/2018   Low back pain radiating to right lower extremity 07/06/2017   Upper back pain, chronic 07/06/2017   Hypertension, essential, benign 03/11/2017   Bipolar disorder with depression ([place]) 03/11/2017   Severe obesity (BMI 35.0-35.9 with comorbidity) ([place]) 03/11/2017    Allergies: No Known Allergies Medications:  Current Outpatient Medications:    amLODipine-benazepril (LOTREL) 5-10 MG capsule, TAKE 1 CAPSULE BY MOUTH EVERY DAY, Disp: 90 capsule, Rfl: 0    benzonatate (TESSALON) 100 MG capsule, Take 1 capsule (100 mg total) by mouth 3 (three) times daily as needed., Disp: 30 capsule, Rfl: 0   clonazePAM (KLONOPIN) 0.5 MG tablet, Take 1 tablet (0.5 mg total) by mouth 2 (two) times daily., Disp: 60 tablet, Rfl: 2   gabapentin (NEURONTIN) 400 MG capsule, Take 1 capsule (400 mg total) by mouth 3 (three) times daily., Disp: 90 capsule, Rfl: 3   ipratropium (ATROVENT) 0.03 % nasal spray, Place 2 sprays into both nostrils every 12 (twelve) hours., Disp: 30 mL, Rfl: 0   loperamide (IMODIUM A-D) 2 MG tablet, Take 1 tablet (2 mg total) by mouth 4 (four) times daily as needed for diarrhea or loose stools., Disp: 30 tablet, Rfl: 0   meclizine (ANTIVERT) 25 MG tablet, Take 1 tablet (25 mg total) by mouth 3 (three) times daily as needed for dizziness., Disp: 90 tablet, Rfl: 1   propranolol (INDERAL) 40 MG tablet, Take 0.5 tablets (20 mg total) by mouth daily., Disp: 90 tablet, Rfl: 0  Observations/Objective: Patient is well-developed, well-nourished in no acute distress.  Resting comfortably  at home.  Head is normocephalic, atraumatic.  No labored breathing.  Speech is clear and coherent with logical content.  Patient is alert and oriented at baseline.   Assessment and Plan: 1. Other migraine without status migrainosus, not intractable No red flags on discussion or visual exam Symptoms are consistent with her past migraines, has NP appt with neuro soon She needs a work-note today -- sent via [facility] medication but she declined, does not feel like she needs this Recommend tylenol, meclizine as needed Low threshold to go to the ER if worsening/new sx -- she verbalized understanding to this  Follow Up Instructions: I discussed the assessment and treatment plan with the patient. The patient was provided an opportunity to ask questions and all were answered. The patient agreed with the plan and demonstrated an understanding of the  instructions.  A copy of instructions were sent to the patient via MyChart unless otherwise noted below.   The patient was advised to call back or seek an in-person evaluation if the symptoms worsen or if the condition fails to improve as anticipated.  Time:  I spent 5-10 minutes with the patient via telehealth technology discussing the above problems/concerns.    Sue Jackson, [place]

## 2021-09-12 NOTE — Patient Instructions (Signed)
Headache Precautions: Contact a doctor if: Your symptoms are not helped by medicine. You have a headache that feels different than the other headaches. You feel sick to your stomach (nauseous) or you throw up (vomit). You have a fever. Get help right away if: Your headache gets very bad quickly. Your headache gets worse after a lot of physical activity. You keep throwing up. You have a stiff neck. You have trouble seeing. You have trouble speaking. You have pain in the eye or ear. Your muscles are weak or you lose muscle control. You lose your balance or have trouble walking. You feel like you will pass out (faint) or you pass out. You are mixed up (confused). You have a seizure.

## 2021-09-13 ENCOUNTER — Encounter: Payer: Self-pay | Admitting: Hematology

## 2021-09-15 ENCOUNTER — Other Ambulatory Visit (HOSPITAL_COMMUNITY): Payer: Self-pay | Admitting: Psychiatry

## 2021-09-15 DIAGNOSIS — F419 Anxiety disorder, unspecified: Secondary | ICD-10-CM

## 2021-09-18 ENCOUNTER — Other Ambulatory Visit: Payer: Self-pay

## 2021-09-21 NOTE — Progress Notes (Deleted)
NEUROLOGY CONSULTATION NOTE  Sue Jackson MRN: [id number] DOB: 1980/10/21  Referring provider: Betty Martinique, MD Primary care provider: Betty Martinique, MD  Reason for consult:  migraines, dizziness  Assessment/Plan:   ***   Subjective:  Sue Jackson is a 41 year old female with Bipolar 2 disorder, HTN, depression and anxiety who presents for migraines and dizziness.  History supplemented by ED and referring provider's notes.   Onset:  *** Location:  *** Quality:  *** Intensity:  ***.  *** denies new headache, thunderclap headache or severe headache that wakes *** from sleep. Aura:  *** Prodrome:  *** Postdrome:  *** Associated symptoms:  ***.  *** denies associated unilateral numbness or weakness. Duration:  *** Frequency:  *** Frequency of abortive medication: *** Triggers:  *** Relieving factors:  *** Activity:  ***  MRI of brain without contrast on 06/22/2020 personally reviewed was normal.  CT head on 07/29/2021 showed no acute intracranial abnormality.  Current NSAIDS/analgesics:  *** Current triptans:  none Current ergotamine:  none Current anti-emetic:  none Current muscle relaxants:  none Current Antihypertensive medications:  propranolol 20mg  daily, amlodipine-benazepril Current Antidepressant medications:  fluoxetine 10mg  daily Current Anticonvulsant medications:  gabapentin 400mg  TID Current anti-CGRP:  none Current Vitamins/Herbal/Supplements:  none Current Antihistamines/Decongestants:  meclizine Other therapy:  *** Hormone/birth control:  none Other medications:  ***  Past NSAIDS/analgesics:  meloxicam Past abortive triptans:  *** Past abortive ergotamine:  *** Past muscle relaxants:  cyclobenzaprine, methocarbamol, tizanidine Past anti-emetic:  *** Past antihypertensive medications:  none Past antidepressant medications:  sertraline Past anticonvulsant medications:  topiramate Past anti-CGRP:  none Past  vitamins/Herbal/Supplements:  *** Past antihistamines/decongestants:  hydroxyzine, Flonase Other past therapies:  ***  Caffeine:  *** Alcohol:  *** Smoker:  *** Diet:  *** Exercise:  *** Depression:  ***; Anxiety:  *** Other pain:  *** Sleep hygiene:  *** Family history of headache:  ***   PAST MEDICAL HISTORY: Past Medical History:  Diagnosis Date   Anemia 2019-2020   Anxiety    Arthritis    Bipolar disorder ([place]) 2013   Cancer ([place]) 2021   Depression    Family history of breast cancer    Headache 2021   Hypertension    Personal history of radiation therapy    Pre-diabetes     PAST SURGICAL HISTORY: Past Surgical History:  Procedure Laterality Date   BREAST BIOPSY Right 04/09/2020   BREAST BIOPSY Left 04/24/2020   x2   BREAST LUMPECTOMY Right 06/12/2020   BREAST LUMPECTOMY WITH RADIOACTIVE SEED LOCALIZATION Right 06/12/2020   Procedure: RIGHT BREAST LUMPECTOMY WITH RADIOACTIVE SEED LOCALIZATION;  Surgeon: Coralie Keens, MD;  Location: [facility];  Service: General;  Laterality: Right;  LMA    MEDICATIONS: Current Outpatient Medications on File Prior to Visit  Medication Sig Dispense Refill   amLODipine-benazepril (LOTREL) 5-10 MG capsule TAKE 1 CAPSULE BY MOUTH EVERY DAY 90 capsule 0   benzonatate (TESSALON) 100 MG capsule Take 1 capsule (100 mg total) by mouth 3 (three) times daily as needed. 30 capsule 0   clonazePAM (KLONOPIN) 0.5 MG tablet Take 1 tablet (0.5 mg total) by mouth 2 (two) times daily. 60 tablet 2   FLUoxetine (PROZAC) 10 MG capsule TAKE 1 CAPSULE BY MOUTH EVERY DAY 90 capsule 1   gabapentin (NEURONTIN) 400 MG capsule Take 1 capsule (400 mg total) by mouth 3 (three) times daily. 90 capsule 3   ipratropium (ATROVENT) 0.03 % nasal spray Place 2 sprays into both nostrils every  12 (twelve) hours. 30 mL 0   loperamide (IMODIUM A-D) 2 MG tablet Take 1 tablet (2 mg total) by mouth 4 (four) times daily as needed for diarrhea or loose stools. 30 tablet 0    meclizine (ANTIVERT) 25 MG tablet Take 1 tablet (25 mg total) by mouth 3 (three) times daily as needed for dizziness. 90 tablet 1   propranolol (INDERAL) 40 MG tablet Take 0.5 tablets (20 mg total) by mouth daily. 90 tablet 0   No current facility-administered medications on file prior to visit.    ALLERGIES: No Known Allergies  FAMILY HISTORY: Family History  Problem Relation Age of Onset   Arthritis Mother    Asthma Mother    Cirrhosis Father        d. 54   Depression Maternal Aunt    Hypertension Maternal Aunt    Breast cancer Maternal Aunt 51   Diabetes Maternal Grandfather    Heart disease Maternal Grandfather    Hypertension Maternal Grandfather    Stroke Maternal Grandfather    Breast cancer Maternal Aunt 59   Breast cancer Paternal Aunt    Breast cancer Cousin        pat first cousin    Objective:  *** General: No acute distress.  Patient appears well-groomed.   Head:  Normocephalic/atraumatic Eyes:  fundi examined but not visualized Neck: supple, no paraspinal tenderness, full range of motion Back: No paraspinal tenderness Heart: regular rate and rhythm Lungs: Clear to auscultation bilaterally. Vascular: No carotid bruits. Neurological Exam: Mental status: alert and oriented to person, place, and time, recent and remote memory intact, fund of knowledge intact, attention and concentration intact, speech fluent and not dysarthric, language intact. Cranial nerves: CN I: not tested CN II: pupils equal, round and reactive to light, visual fields intact CN III, IV, VI:  full range of motion, no nystagmus, no ptosis CN V: facial sensation intact. CN VII: upper and lower face symmetric CN VIII: hearing intact CN IX, X: gag intact, uvula midline CN XI: sternocleidomastoid and trapezius muscles intact CN XII: tongue midline Bulk & Tone: normal, no fasciculations. Motor:  muscle strength 5/5 throughout Sensation:  Pinprick, temperature and vibratory sensation  intact. Deep Tendon Reflexes:  2+ throughout,  toes downgoing.   Finger to nose testing:  Without dysmetria.   Heel to shin:  Without dysmetria.   Gait:  Normal station and stride.  Romberg negative.    Thank you for allowing me to take part in the care of this patient.  Metta Clines, DO  CC: ***

## 2021-09-22 ENCOUNTER — Encounter (HOSPITAL_COMMUNITY): Payer: Self-pay

## 2021-09-23 ENCOUNTER — Ambulatory Visit: Payer: 59 | Admitting: Neurology

## 2021-10-01 ENCOUNTER — Encounter (HOSPITAL_COMMUNITY): Payer: Self-pay | Admitting: Psychiatry

## 2021-10-01 ENCOUNTER — Telehealth (INDEPENDENT_AMBULATORY_CARE_PROVIDER_SITE_OTHER): Payer: 59 | Admitting: Psychiatry

## 2021-10-01 DIAGNOSIS — F3181 Bipolar II disorder: Secondary | ICD-10-CM | POA: Diagnosis not present

## 2021-10-01 DIAGNOSIS — F419 Anxiety disorder, unspecified: Secondary | ICD-10-CM | POA: Diagnosis not present

## 2021-10-01 MED ORDER — GABAPENTIN 400 MG PO CAPS: 400.0000 mg | ORAL_CAPSULE | Freq: Three times a day (TID) | ORAL | 3 refills | Status: DC

## 2021-10-01 NOTE — Progress Notes (Signed)
[facility] MD/PA/NP OP Progress Note Virtual Visit via Telephone Note  I connected with Sue Jackson on 10/01/21 at  3:30 PM EST by telephone and verified that I am speaking with the correct person using two identifiers.  Location: Patient: home Provider: Clinic   I discussed the limitations, risks, security and privacy concerns of performing an evaluation and management service by telephone and the availability of in person appointments. I also discussed with the patient that there may be a patient responsible charge related to this service. The patient expressed understanding and agreed to proceed.   I provided 30 minutes of non-face-to-face time during this encounter.    10/01/2021 3:41 PM Sue Jackson  MRN:  [id number]  Chief Complaint:  "I have dizziness when Im stressed and don't eat on time"  HPI: 41 year old female seen today for follow-up psychiatric evaluation. She has a psychiatric history of bipolar disorder, SI, depression, and anxiety.  She is currently managed on Gabapentin 400 mg three times daily and Klonopin 0.5 mg twice daily.  She informed Probation officer that he discontinued Klonopin and feels mentally stable.  Today patient was unable to login virtually so her assessment was done over the phone. During exam she was pleasant cooperative, and engaged in conversation. She informed Probation officer that continues to have some dizziness when she forgets to eat or is stressed.  Writer informed patient that propanolol as well as gabapentin can cause dizziness.  She endorsed understanding.  She is followed by neurology and has upcoming appointments for her dizziness however reports that she is dealing with it by having balanced meals.  Recently patient notes that her wife had surgery and informed writer that she had to cancel several of her appointments to care for her.  She notes that she is trying not to miss any other appointments as she wants to now focus on her health.  Overall  patient notes that she feels mentally stable and reports that gabapentin has been effective in managing her mood and anxiety.  Today provider conducted a GAD 7 scored a 9, at her last visit she scored a 16.  Provider also conducted PHQ-9 and patient scored a 4, at her last visit she scored a 13.  She endorses adequate sleep and appetite.  Patient endorses passive SI however notes that she will not harm herself.  Today she denies SI/HI/VAH, mania, or paranoia.   At this time Klonopin not refilled and will be discontinued.  Patient will continue gabapentin as prescribed.  No other concerns at this time.     Visit Diagnosis:    ICD-10-CM   1. Anxiety  F41.9 gabapentin (NEURONTIN) 400 MG capsule    2. Bipolar 2 disorder (HCC)  F31.81 gabapentin (NEURONTIN) 400 MG capsule      Past Psychiatric History: : Bipolar depression, anxiety.  History of 1 prior psychiatry hospitaliation in 2013 after overdosing on Effexor. Past Medical History:  Past Medical History:  Diagnosis Date   Anemia 2019-2020   Anxiety    Arthritis    Bipolar disorder ([place]) 2013   Cancer ([place]) 2021   Depression    Family history of breast cancer    Headache 2021   Hypertension    Personal history of radiation therapy    Pre-diabetes     Past Surgical History:  Procedure Laterality Date   BREAST BIOPSY Right 04/09/2020   BREAST BIOPSY Left 04/24/2020   x2   BREAST LUMPECTOMY Right 06/12/2020   BREAST LUMPECTOMY WITH RADIOACTIVE SEED LOCALIZATION  Right 06/12/2020   Procedure: RIGHT BREAST LUMPECTOMY WITH RADIOACTIVE SEED LOCALIZATION;  Surgeon: Coralie Keens, MD;  Location: [facility];  Service: General;  Laterality: Right;  LMA    Family Psychiatric History: Maternal aunt- depression  Family History:  Family History  Problem Relation Age of Onset   Arthritis Mother    Asthma Mother    Cirrhosis Father        d. 24   Depression Maternal Aunt    Hypertension Maternal Aunt    Breast cancer Maternal Aunt 51    Diabetes Maternal Grandfather    Heart disease Maternal Grandfather    Hypertension Maternal Grandfather    Stroke Maternal Grandfather    Breast cancer Maternal Aunt 59   Breast cancer Paternal Aunt    Breast cancer Cousin        pat first cousin    Social History:  Social History   Socioeconomic History   Marital status: Married    Spouse name: Not on file   Number of children: 0   Years of education: Not on file   Highest education level: Not on file  Occupational History   Not on file  Tobacco Use   Smoking status: Former   Smokeless tobacco: Never   Tobacco comments:    social smoker  Vaping Use   Vaping Use: Never used  Substance and Sexual Activity   Alcohol use: Yes    Comment: occasionally   Drug use: Yes    Types: Marijuana   Sexual activity: Yes    Birth control/protection: None  Other Topics Concern   Not on file  Social History Narrative   Not on file   Social Determinants of Health   Financial Resource Strain: Not on file  Food Insecurity: Not on file  Transportation Needs: Not on file  Physical Activity: Not on file  Stress: Not on file  Social Connections: Not on file    Allergies: No Known Allergies  Metabolic Disorder Labs: Lab Results  Component Value Date   HGBA1C 6.4 08/12/2021   No results found for: PROLACTIN Lab Results  Component Value Date   CHOL 134 01/30/2020   TRIG 62.0 01/30/2020   HDL 45.50 01/30/2020   CHOLHDL 3 01/30/2020   VLDL 12.4 01/30/2020   LDLCALC 76 01/30/2020   LDLCALC 95 03/11/2017   Lab Results  Component Value Date   TSH 2.51 11/21/2018   TSH 3.68 03/11/2017    Therapeutic Level Labs: No results found for: LITHIUM No results found for: VALPROATE No components found for:  CBMZ  Current Medications: Current Outpatient Medications  Medication Sig Dispense Refill   amLODipine-benazepril (LOTREL) 5-10 MG capsule TAKE 1 CAPSULE BY MOUTH EVERY DAY 90 capsule 0   benzonatate (TESSALON) 100 MG  capsule Take 1 capsule (100 mg total) by mouth 3 (three) times daily as needed. 30 capsule 0   gabapentin (NEURONTIN) 400 MG capsule Take 1 capsule (400 mg total) by mouth 3 (three) times daily. 90 capsule 3   ipratropium (ATROVENT) 0.03 % nasal spray Place 2 sprays into both nostrils every 12 (twelve) hours. 30 mL 0   loperamide (IMODIUM A-D) 2 MG tablet Take 1 tablet (2 mg total) by mouth 4 (four) times daily as needed for diarrhea or loose stools. 30 tablet 0   meclizine (ANTIVERT) 25 MG tablet Take 1 tablet (25 mg total) by mouth 3 (three) times daily as needed for dizziness. 90 tablet 1   propranolol (INDERAL) 40 MG tablet Take  0.5 tablets (20 mg total) by mouth daily. 90 tablet 0   No current facility-administered medications for this visit.     Musculoskeletal: Strength & Muscle Tone:  Unable to assess due to telephone visit Gait & Station:  Unable to assess due to telephone visit Patient leans: N/A  Psychiatric Specialty Exam: Review of Systems  There were no vitals taken for this visit.There is no height or weight on file to calculate BMI.  General Appearance: Unable to assess due to telephone visit  Eye Contact:    Unable to assess due to telephone visit  Speech:  Clear and Coherent and Normal Rate  Volume:  Normal  Mood:  Euthymic  Affect:  Appropriate and Congruent  Thought Process:  Coherent, Goal Directed, and Linear  Orientation:  Full (Time, Place, and Person)  Thought Content: WDL and Logical   Suicidal Thoughts:  Yes.  without intent/plan  Homicidal Thoughts:  No  Memory:  Immediate;   Good Recent;   Good Remote;   Good  Judgement:  Good  Insight:  Good  Psychomotor Activity:    Unable to assess due to telephone visit  Concentration:  Concentration: Good and Attention Span: Good  Recall:  Good  Fund of Knowledge: Good  Language: Good  Akathisia:     Unable to assess due to telephone visit  Handed:  Right  AIMS (if indicated): not done  Assets:   Communication Skills Desire for Improvement Financial Resources/Insurance Housing Intimacy Physical Health Social Support  ADL's:  Intact  Cognition: WNL  Sleep:  Good   Screenings: GAD-7    Flowsheet Row Video Visit from 10/01/2021 in [facility] Counselor from 09/02/2021 in [facility] Video Visit from 07/13/2021 in [facility] Video Visit from 04/27/2021 in [facility]  Total GAD-7 Score 9 14 16 21       PHQ2-9    Flowsheet Row Video Visit from 10/01/2021 in [facility] Office Visit from 08/12/2021 in [facility] [place] from 07/13/2021 in [facility] Video Visit from 04/27/2021 in [facility] from 04/16/2020 in [facility]  PHQ-2 Total Score 2 3 3 2 1   PHQ-9 Total Score 4 16 13 12  --      Flowsheet Row Video Visit from 10/01/2021 in [facility] Video Visit from 07/13/2021 in [facility] Video Visit from 04/27/2021 in [place] Error: Q7 should not be populated when Q6 is No Error: Q7 should not be populated when Q6 is No Error: Q7 should not be populated when Q6 is No        Assessment and Plan: Patient notes that she is doing well on her current medication regimen.  Patient reports that she has not taking Klonopin since her last visit.  At this time Klonopin not refilled and will be discontinued.     1. Anxiety  Continue- gabapentin (NEURONTIN) 400 MG capsule; Take 1 capsule (400 mg total) by mouth 3 (three) times daily.  Dispense: 90 capsule; Refill: 3  2. Bipolar 2 disorder (HCC)  Continue- gabapentin (NEURONTIN) 400 MG capsule; Take 1 capsule (400 mg total) by mouth 3 (three) times daily.  Dispense:  90 capsule; Refill: 3    Follow-up in 3 months Follow-up with therapy  Salley Slaughter, NP 10/01/2021, 3:41  PM

## 2021-10-14 ENCOUNTER — Other Ambulatory Visit: Payer: Self-pay | Admitting: Family Medicine

## 2021-10-14 ENCOUNTER — Other Ambulatory Visit: Payer: Self-pay

## 2021-10-14 ENCOUNTER — Ambulatory Visit
Admission: RE | Admit: 2021-10-14 | Discharge: 2021-10-14 | Disposition: A | Discharge status: Home / Self Care | Payer: 59 | Source: Ambulatory Visit | Attending: Hematology | Admitting: Hematology

## 2021-10-14 DIAGNOSIS — I10 Essential (primary) hypertension: Secondary | ICD-10-CM

## 2021-10-14 DIAGNOSIS — D0511 Intraductal carcinoma in situ of right breast: Secondary | ICD-10-CM

## 2021-10-14 IMAGING — MR MR BREAST BILAT WO/W CM
8 of 12 series · 33 of 48 positions shown · IV contrast (gadavist)
Comparison: Previous exam(s).

CLINICAL DATA: 40-year-old female with history of high-grade DCIS
in the right breast status post right lumpectomy in [DATE]. She had 2 benign biopsies of the left breast in [DATE] demonstrating fibroadenomatoid change in the upper inner
quadrant and fibrocystic changes in the lower-inner quadrant.

EXAM:
BILATERAL BREAST MRI WITH AND WITHOUT CONTRAST
TECHNIQUE: Multiplanar, multisequence MR images of both breasts were obtained
prior to and following the intravenous administration of 10 ml of
Gadavist

[Series 2: t2_tirm_tra ipat (a-p) · axial · 3.0mm · 0.72mm/px · 1 of 55 slices shown]
[im 1/55]
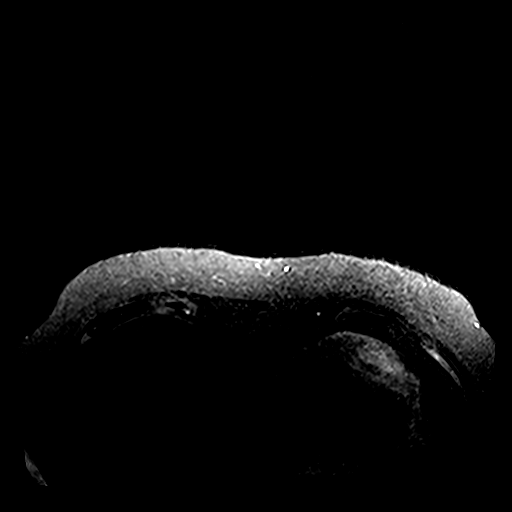

[Series 3: fl3d pre-cm no · axial · non-contrast · 1.2mm · 0.96mm/px · z∈[-59,+113]mm · 5 of 144 slices shown]
[im 1/144]
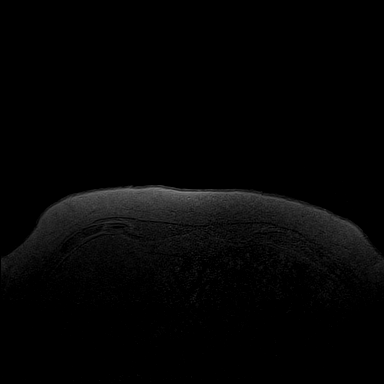
[im 36/144]
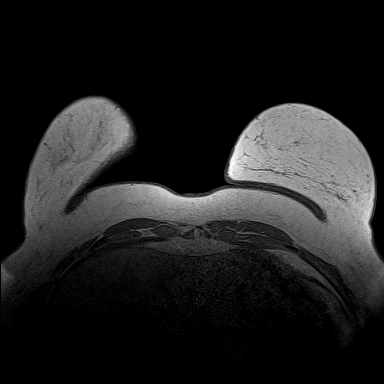
[im 72/144]
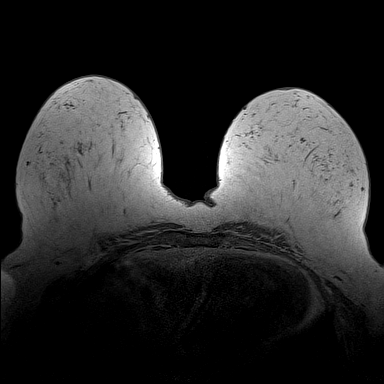
[im 108/144]
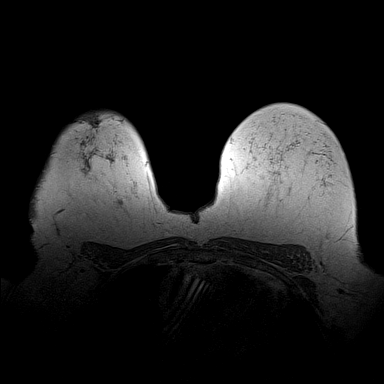
[im 144/144]
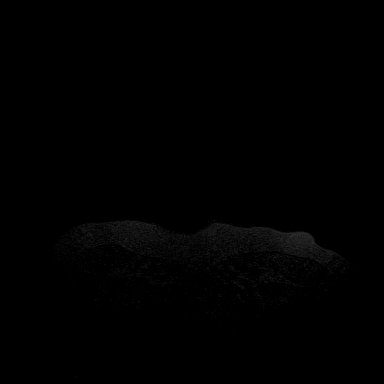

[Series 4: fl3d pre-cm · axial · non-contrast · 1.2mm · 0.96mm/px · z∈[-59,+113]mm · 5 of 144 slices shown]
[im 1/144]
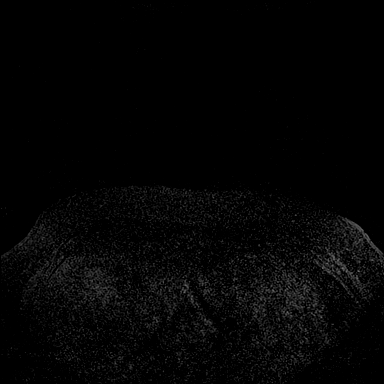
[im 36/144]
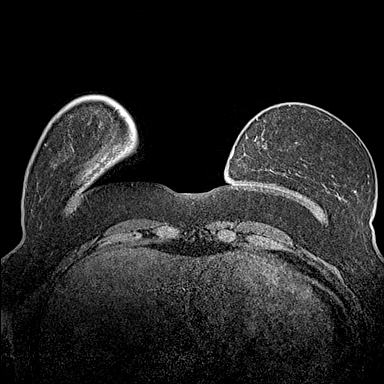
[im 72/144]
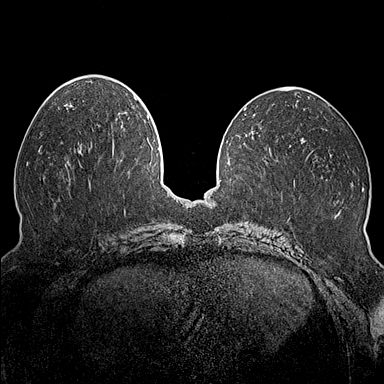
[im 108/144]
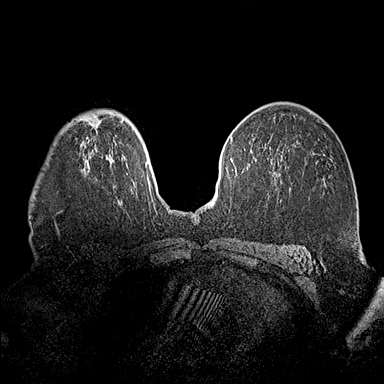
[im 144/144]
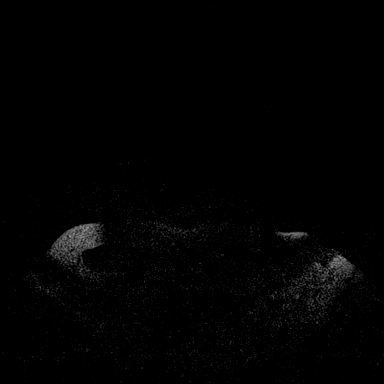

[Series 5: fl3d post-cm 20 · axial · 1.2mm · 0.96mm/px · z∈[-59,+113]mm · 5 of 144 slices shown (1 of 3)]
[im 1/144]
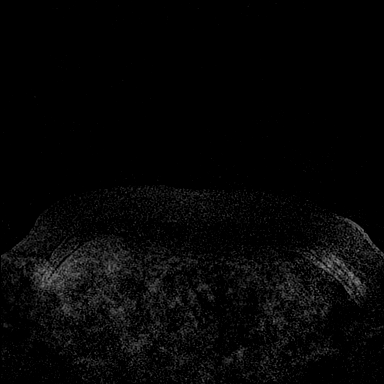
[im 36/144]
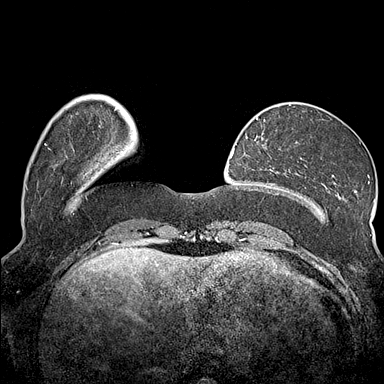
[im 72/144]
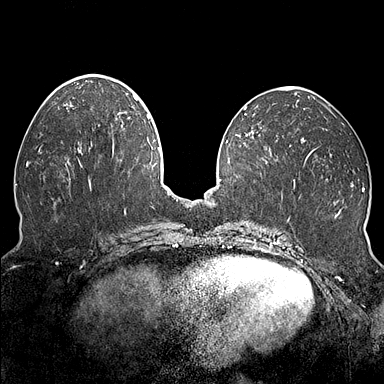
[im 108/144]
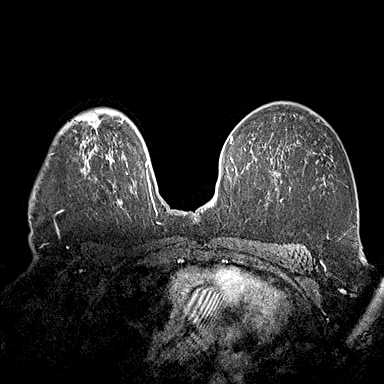
[im 144/144]
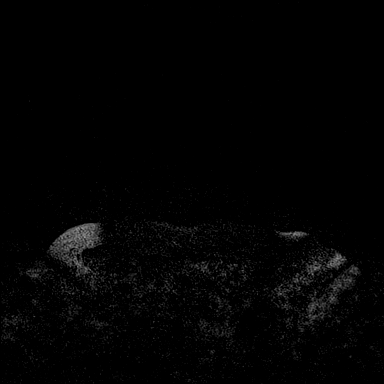

[Series 6: fl3d post-cm 20 · axial · 1.2mm · 0.96mm/px · z∈[-59,+113]mm · 5 of 144 slices shown (2 of 3)]
[im 1/144]
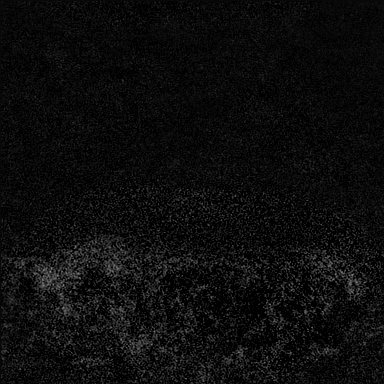
[im 36/144]
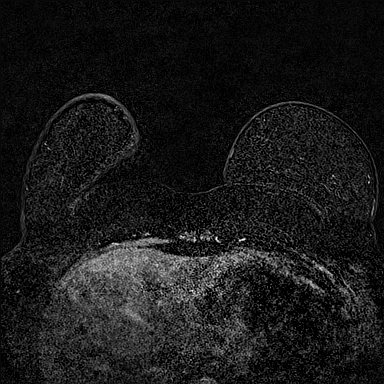
[im 72/144]
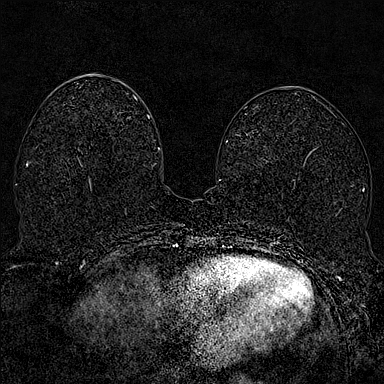
[im 108/144]
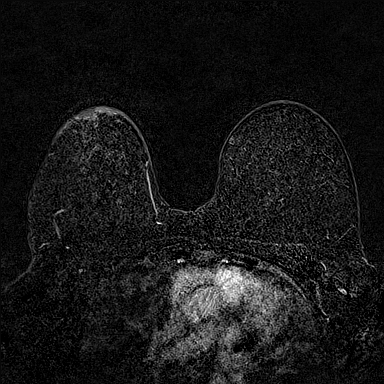
[im 144/144]
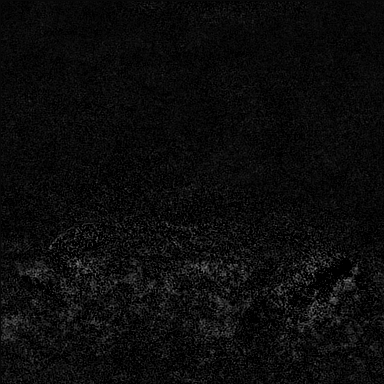

[Series 7: fl3d post-cm 20 · axial · 172.8mm · 0.96mm/px · 1 of 1 slices shown (3 of 3)]
[im 1/1]
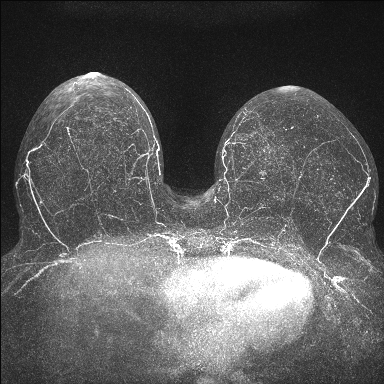

[Series 8: fl3d post-cm 3 · axial · 1.2mm · 0.96mm/px · z∈[-59,+113]mm · 6 of 144 slices shown (1 of 2)]
[im 1/144]
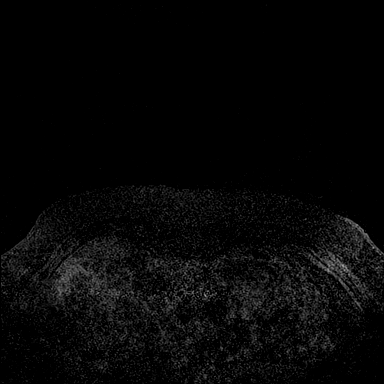
[im 29/144]
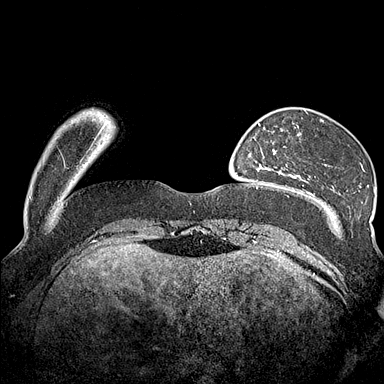
[im 58/144]
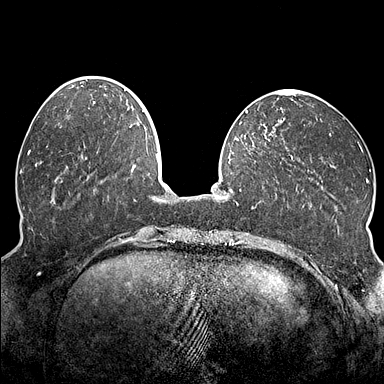
[im 86/144]
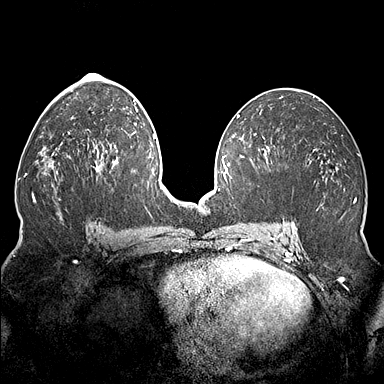
[im 115/144]
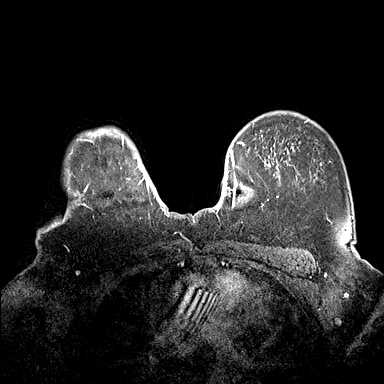
[im 144/144]
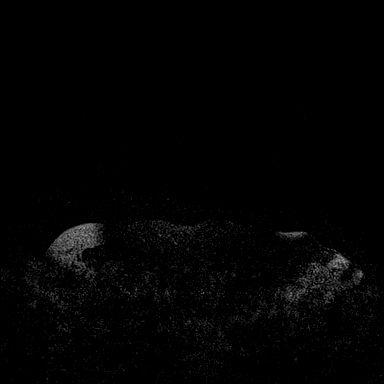

[Series 9: fl3d post-cm 3 · axial · 1.2mm · 0.96mm/px · z∈[-59,+78]mm · 5 of 144 slices shown (2 of 2)]
[im 1/144]
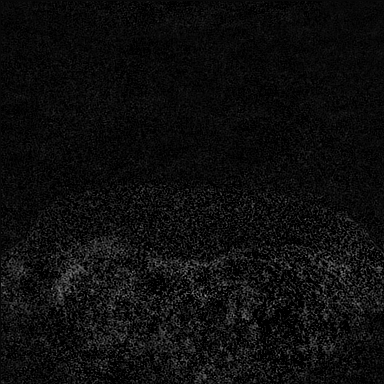
[im 29/144]
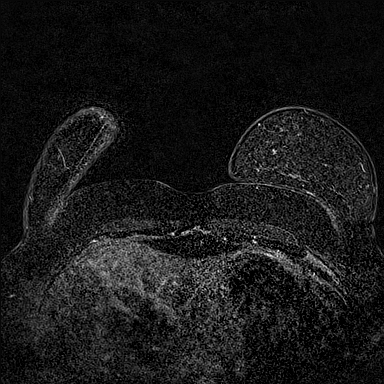
[im 58/144]
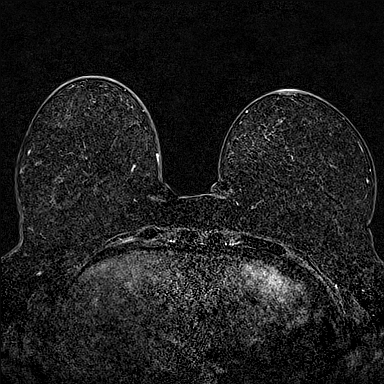
[im 86/144]
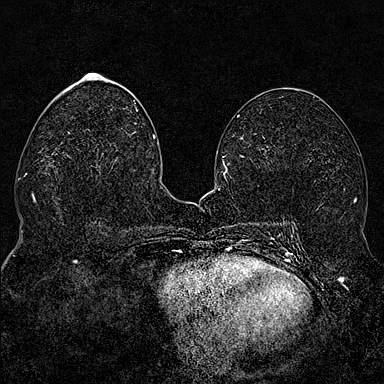
[im 115/144]
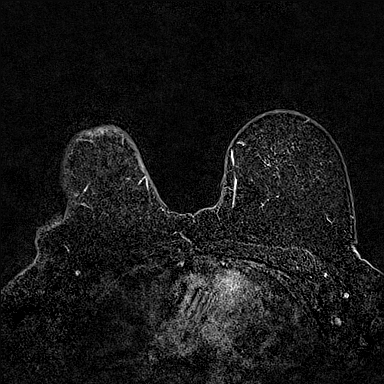

[33 of 48 positions shown; findings below may reference images not displayed]

Three-dimensional MR images were rendered by post-processing of the
original MR data on an independent workstation. The
three-dimensional MR images were interpreted, and findings are
reported in the following complete MRI report for this study. Three
dimensional images were evaluated at the independent interpreting
workstation using the DynaCAD thin client.
FINDINGS: Breast composition: b. Scattered fibroglandular tissue.

Background parenchymal enhancement: Minimal.

Right breast: Surgical changes in the superior anterior right breast
are consistent with history of lumpectomy. No evidence of suspicious
enhancement at the lumpectomy site. There is no mass or abnormal
enhancement elsewhere in the right breast

Left breast: No mass or abnormal enhancement.

Lymph nodes: No abnormal appearing lymph nodes.

Ancillary findings:  None.
IMPRESSION: No MRI evidence of malignancy in either breast.

RECOMMENDATION:
1.  Annual diagnostic mammography is due in [DATE].

2.  Annual high risk screening MRI in 1 year.

BI-RADS CATEGORY  2: Benign.

## 2021-10-14 MED ORDER — GADOBUTROL 1 MMOL/ML IV SOLN
10.0000 mL | Freq: Once | INTRAVENOUS | Status: AC | PRN
  Administered 2021-10-14: 10 mL via INTRAVENOUS

## 2021-10-18 ENCOUNTER — Telehealth: Payer: 59 | Admitting: Family

## 2021-10-18 DIAGNOSIS — J069 Acute upper respiratory infection, unspecified: Secondary | ICD-10-CM

## 2021-10-18 MED ORDER — CETIRIZINE HCL 10 MG PO TABS: 10.0000 mg | ORAL_TABLET | Freq: Every day | ORAL | 1 refills | Status: AC

## 2021-10-18 MED ORDER — FLUTICASONE PROPIONATE 50 MCG/ACT NA SUSP: 2.0000 | Freq: Every day | NASAL | 6 refills | Status: DC

## 2021-10-18 NOTE — Progress Notes (Signed)
?Virtual Visit Consent  ? ?Sue Jackson, you are scheduled for a virtual visit with a [facility] provider today.   ?  ?Just as with appointments in the office, your consent must be obtained to participate.  Your consent will be active for this visit and any virtual visit you may have with one of our providers in the next 365 days.   ?  ?If you have a MyChart account, a copy of this consent can be sent to you electronically.  All virtual visits are billed to your insurance company just like a traditional visit in the office.   ? ?As this is a virtual visit, video technology does not allow for your provider to perform a traditional examination.  This may limit your provider's ability to fully assess your condition.  If your provider identifies any concerns that need to be evaluated in person or the need to arrange testing (such as labs, EKG, etc.), we will make arrangements to do so.   ?  ?Although advances in technology are sophisticated, we cannot ensure that it will always work on either your end or our end.  If the connection with a video visit is poor, the visit may have to be switched to a telephone visit.  With either a video or telephone visit, we are not always able to ensure that we have a secure connection.    ? ?I need to obtain your verbal consent now.   Are you willing to proceed with your visit today?  ?  ?[name] has provided verbal consent on 10/18/2021 for a virtual visit (video or telephone). ?  ?[name], FNP  ? ?Date: 10/18/2021 2:04 PM ? ? ?Virtual Visit via Video Note  ? ?IEvelina Dun, connected with  Sue Jackson  ([id number], [birth year]) on 10/18/21 at  1:45 PM EDT by a video-enabled telemedicine application and verified that I am speaking with the correct person using two identifiers. ? ?Location: ?Patient: Virtual Visit Location Patient: Home ?Provider: Virtual Visit Location Provider: Home Office ?  ?I discussed the limitations of evaluation  and management by telemedicine and the availability of in person appointments. The patient expressed understanding and agreed to proceed.   ? ?History of Present Illness: ?Sue Jackson is a 41 y.o. who identifies as a female who was assigned female at birth, and is being seen today for sore throat and cough that started last night. She took a home COVID test that was negative.  ? ?HPI: URI  ?This is a new problem. The current episode started yesterday. The problem has been gradually worsening. There has been no fever. Associated symptoms include congestion, coughing, headaches, rhinorrhea, sinus pain, sneezing and a sore throat. Pertinent negatives include no ear pain or wheezing. She has tried decongestant for the symptoms. The treatment provided mild relief.   ?Problems:  ?Patient Active Problem List  ? Diagnosis Date Noted  ? Malignant neoplasm of right female breast, unspecified estrogen receptor status, unspecified site of breast ([place]) 08/13/2021  ? Genetic testing 08/20/2020  ? Family history of breast cancer   ? 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 Bipolar 2 disorder ([place]) 11/16/2019  ? Prediabetes 08/15/2018  ? Anxiety 02/06/2018  ? Low back pain radiating to right lower extremity 07/06/2017  ? Upper back pain, chronic 07/06/2017  ? Hypertension, essential, benign 03/11/2017  ? Bipolar disorder with depression ([place]) 03/11/2017  ? Severe obesity (BMI 35.0-35.9 with comorbidity) ([place]) 03/11/2017  ?  ?  Allergies: No Known Allergies ?Medications:  ?Current Outpatient Medications:  ?  cetirizine (ZYRTEC ALLERGY) 10 MG tablet, Take 1 tablet (10 mg total) by mouth daily., Disp: 90 tablet, Rfl: 1 ?  fluticasone (FLONASE) 50 MCG/ACT nasal spray, Place 2 sprays into both nostrils daily., Disp: 16 g, Rfl: 6 ?  amLODipine-benazepril (LOTREL) 5-10 MG capsule, TAKE 1 CAPSULE BY MOUTH EVERY DAY, Disp: 90 capsule, Rfl: 2 ?  benzonatate (TESSALON) 100 MG capsule, Take 1 capsule (100 mg  total) by mouth 3 (three) times daily as needed., Disp: 30 capsule, Rfl: 0 ?  gabapentin (NEURONTIN) 400 MG capsule, Take 1 capsule (400 mg total) by mouth 3 (three) times daily., Disp: 90 capsule, Rfl: 3 ?  ipratropium (ATROVENT) 0.03 % nasal spray, Place 2 sprays into both nostrils every 12 (twelve) hours., Disp: 30 mL, Rfl: 0 ?  loperamide (IMODIUM A-D) 2 MG tablet, Take 1 tablet (2 mg total) by mouth 4 (four) times daily as needed for diarrhea or loose stools., Disp: 30 tablet, Rfl: 0 ?  meclizine (ANTIVERT) 25 MG tablet, Take 1 tablet (25 mg total) by mouth 3 (three) times daily as needed for dizziness., Disp: 90 tablet, Rfl: 1 ?  propranolol (INDERAL) 40 MG tablet, Take 0.5 tablets (20 mg total) by mouth daily., Disp: 90 tablet, Rfl: 0 ? ?Observations/Objective: ?Patient is well-developed, well-nourished in no acute distress.  ?Resting comfortably  at home.  ?Head is normocephalic, atraumatic.  ?No labored breathing.  ?Speech is clear and coherent with logical content.  ?Patient is alert and oriented at baseline.  ?Nasal congestion noted ? ?Assessment and Plan: ?1. Viral URI ?- fluticasone (FLONASE) 50 MCG/ACT nasal spray; Place 2 sprays into both nostrils daily.  Dispense: 16 g; Refill: 6 ?- cetirizine (ZYRTEC ALLERGY) 10 MG tablet; Take 1 tablet (10 mg total) by mouth daily.  Dispense: 90 tablet; Refill: 1 ? ?- Take meds as prescribed ?- Use a cool mist humidifier  ?-Use saline nose sprays frequently ?-Force fluids ?-For any cough or congestion ? Use plain Mucinex- regular strength or max strength is fine ?-For fever or aces or pains- take tylenol or ibuprofen. ?-Throat lozenges if help ?Follow up if symptoms worsen or do not improve  ?Work note given  ? ? ?Follow Up Instructions: ?I discussed the assessment and treatment plan with the patient. The patient was provided an opportunity to ask questions and all were answered. The patient agreed with the plan and demonstrated an understanding of the  instructions.  A copy of instructions were sent to the patient via MyChart unless otherwise noted below.  ? ? ? ?The patient was advised to call back or seek an in-person evaluation if the symptoms worsen or if the condition fails to improve as anticipated. ? ?Time:  ?I spent 8 minutes with the patient via telehealth technology discussing the above problems/concerns.   ? ?[name], FNP ? ?

## 2021-10-18 NOTE — Patient Instructions (Signed)
Upper Respiratory Infection, Adult ?An upper respiratory infection (URI) is a common viral infection of the nose, throat, and upper air passages that lead to the lungs. The most common type of URI is the common cold. URIs usually get better on their own, without medical treatment. ?What are the causes? ?A URI is caused by a virus. You may catch a virus by: ?Breathing in droplets from an infected person's cough or sneeze. ?Touching something that has been exposed to the virus (is contaminated) and then touching your mouth, nose, or eyes. ?What increases the risk? ?You are more likely to get a URI if: ?You are very young or very old. ?You have close contact with others, such as at work, school, or a health care facility. ?You smoke. ?You have long-term (chronic) heart or lung disease. ?You have a weakened disease-fighting system (immune system). ?You have nasal allergies or asthma. ?You are experiencing a lot of stress. ?You have poor nutrition. ?What are the signs or symptoms? ?A URI usually involves some of the following symptoms: ?Runny or stuffy (congested) nose. ?Cough. ?Sneezing. ?Sore throat. ?Headache. ?Fatigue. ?Fever. ?Loss of appetite. ?Pain in your forehead, behind your eyes, and over your cheekbones (sinus pain). ?Muscle aches. ?Redness or irritation of the eyes. ?Pressure in the ears or face. ?How is this diagnosed? ?This condition may be diagnosed based on your medical history and symptoms, and a physical exam. Your health care provider may use a swab to take a mucus sample from your nose (nasal swab). This sample can be tested to determine what virus is causing the illness. ?How is this treated? ?URIs usually get better on their own within 7-10 days. Medicines cannot cure URIs, but your health care provider may recommend certain medicines to help relieve symptoms, such as: ?Over-the-counter cold medicines. ?Cough suppressants. Coughing is a type of defense against infection that helps to clear the  respiratory system, so take these medicines only as recommended by your health care provider. ?Fever-reducing medicines. ?Follow these instructions at home: ?Activity ?Rest as needed. ?If you have a fever, stay home from work or school until your fever is gone or until your health care provider says your URI cannot spread to other people (is no longer contagious). Your health care provider may have you wear a face mask to prevent your infection from spreading. ?Relieving symptoms ?Gargle with a mixture of salt and water 3-4 times a day or as needed. To make salt water, completely dissolve ?-1 tsp (3-6 g) of salt in 1 cup (237 mL) of warm water. ?Use a cool-mist humidifier to add moisture to the air. This can help you breathe more easily. ?Eating and drinking ? ?Drink enough fluid to keep your urine pale yellow. ?Eat soups and other clear broths. ?General instructions ? ?Take over-the-counter and prescription medicines only as told by your health care provider. These include cold medicines, fever reducers, and cough suppressants. ?Do not use any products that contain nicotine or tobacco. These products include cigarettes, chewing tobacco, and vaping devices, such as e-cigarettes. If you need help quitting, ask your health care provider. ?Stay away from secondhand smoke. ?Stay up to date on all immunizations, including the yearly (annual) flu vaccine. ?Keep all follow-up visits. This is important. ?How to prevent the spread of infection to others ?URIs can be contagious. To prevent the infection from spreading: ?Wash your hands with soap and water for at least 20 seconds. If soap and water are not available, use hand sanitizer. ?Avoid touching your mouth,  face, eyes, or nose. ?Cough or sneeze into a tissue or your sleeve or elbow instead of into your hand or into the air. ? ?Contact a health care provider if: ?You are getting worse instead of better. ?You have a fever or chills. ?Your mucus is brown or red. ?You have  yellow or brown discharge coming from your nose. ?You have pain in your face, especially when you bend forward. ?You have swollen neck glands. ?You have pain while swallowing. ?You have white areas in the back of your throat. ?Get help right away if: ?You have shortness of breath that gets worse. ?You have severe or persistent: ?Headache. ?Ear pain. ?Sinus pain. ?Chest pain. ?You have chronic lung disease along with any of the following: ?Making high-pitched whistling sounds when you breathe, most often when you breathe out (wheezing). ?Prolonged cough (more than 14 days). ?Coughing up blood. ?A change in your usual mucus. ?You have a stiff neck. ?You have changes in your: ?Vision. ?Hearing. ?Thinking. ?Mood. ?These symptoms may be an emergency. Get help right away. Call 911. ?Do not wait to see if the symptoms will go away. ?Do not drive yourself to the hospital. ?Summary ?An upper respiratory infection (URI) is a common infection of the nose, throat, and upper air passages that lead to the lungs. ?A URI is caused by a virus. ?URIs usually get better on their own within 7-10 days. ?Medicines cannot cure URIs, but your health care provider may recommend certain medicines to help relieve symptoms. ?This information is not intended to replace advice given to you by your health care provider. Make sure you discuss any questions you have with your health care provider. ?Document Revised: 02/25/2021 Document Reviewed: 02/25/2021 ?Elsevier Patient Education ? [place]. ? ?

## 2021-10-30 ENCOUNTER — Telehealth (HOSPITAL_COMMUNITY): Payer: Self-pay | Admitting: *Deleted

## 2021-10-30 NOTE — Telephone Encounter (Signed)
Call from patient wanting to know who is covering for her provider while she is out on leave. Sue Sue Penn NP is covering for her but is not working this pm. Patient has FMLA paperwork that needs to be filled out for this year, and  will need it before her appt in May. She was encouraged to call Monday when Sue Sue is here or any day but fri to have staff check with NP to see what she will need to consider completing paperwork, she may want to see patient to do any paperwork and get an appt scheduled for patient. Sue Jackson states she doesn't yet have the FMLA paperwork but expects to in the next two weeks and wanted to be prepared. She will call next week.  ?

## 2021-11-15 ENCOUNTER — Other Ambulatory Visit: Payer: Self-pay | Admitting: Family Medicine

## 2021-11-15 DIAGNOSIS — I10 Essential (primary) hypertension: Secondary | ICD-10-CM

## 2021-11-15 DIAGNOSIS — F419 Anxiety disorder, unspecified: Secondary | ICD-10-CM

## 2021-11-24 ENCOUNTER — Encounter: Payer: Self-pay | Admitting: Family Medicine

## 2021-11-25 ENCOUNTER — Encounter: Payer: Self-pay | Admitting: Family Medicine

## 2021-11-25 ENCOUNTER — Telehealth (INDEPENDENT_AMBULATORY_CARE_PROVIDER_SITE_OTHER): Payer: 59 | Admitting: Family Medicine

## 2021-11-25 VITALS — Ht 67.0 in

## 2021-11-25 DIAGNOSIS — R519 Headache, unspecified: Secondary | ICD-10-CM | POA: Diagnosis not present

## 2021-11-25 DIAGNOSIS — I10 Essential (primary) hypertension: Secondary | ICD-10-CM

## 2021-11-25 DIAGNOSIS — R42 Dizziness and giddiness: Secondary | ICD-10-CM

## 2021-11-25 NOTE — Progress Notes (Signed)
Virtual Visit via Video Note ?I connected with Sue Jackson on 11/25/21 by a video enabled telemedicine application and verified that I am speaking with the correct person using two identifiers. ? Location patient: home ?Location provider:work office ?Persons participating in the virtual visit: patient, provider ? ?I discussed the limitations of evaluation and management by telemedicine and the availability of in person appointments. The patient expressed understanding and agreed to proceed. ? ?Chief Complaint  ?Patient presents with  ? discuss medical condition  ? ?HPI: ?Sue Jackson is a 41 yo female with hx of HTN, dizziness,dysglycemia, right breast cancer,anxiety,and bipolar disorder requesting a letter to be excluded from working at the "[employer]" area at [place] [employer]. She is being asked to work there when help is needed but in the past working there exacerbated her migraine, "vertigo" and aggravates BP (high 150's/100). ? ?She states that [employer] is very hot, she usually had to leave earlier because did not feel well when every time she work there. ? ?She has seen ENT for dizziness, thought to be related to migraine headache. She has been Dx'ed with vestibular migraines. ?Dizziness is exacerbated  by stress,skipping meals,bright light,and heat. ?Problem has improved. ? ?Generalized headache and sometimes neck pain. ?No associated N/V,visual changes,or photophobia. ?Brain MRI done on 06/22/20 was negative. ? ?She had to cancel appt with neuro but has already re-scheduled. ? ?BP hx been well controlled. ?Usually BP's 120's/70's. ?She is on Propranolol 40 mg 1/2 tab daily and amlodipine-Benazepril 5-10 mg daily. ?Tolerating medication well. ?Negative for CP<palpitation,SOB,focal neurologic deficit,orthopnea,PND,or edema. ? ?Lab Results  ?Component Value Date  ? CREATININE 0.77 06/05/2020  ? BUN 18 06/05/2020  ? NA 134 (L) 06/05/2020  ? K 4.2 06/05/2020  ? CL 104 06/05/2020  ? CO2 19 (L) 06/05/2020  ? ?ROS:  See pertinent positives and negatives per HPI. ? ?Past Medical History:  ?Diagnosis Date  ? Anemia 2019-2020  ? Anxiety   ? Arthritis   ? Bipolar disorder ([place]) 2013  ? Cancer [facility]) 2021  ? Depression   ? Family history of breast cancer   ? Headache 2021  ? Hypertension   ? Personal history of radiation therapy   ? Pre-diabetes   ? ? ?Past Surgical History:  ?Procedure Laterality Date  ? BREAST BIOPSY Right 04/09/2020  ? BREAST BIOPSY Left 04/24/2020  ? x2  ? BREAST LUMPECTOMY Right 06/12/2020  ? BREAST LUMPECTOMY WITH RADIOACTIVE SEED LOCALIZATION Right 06/12/2020  ? Procedure: RIGHT BREAST LUMPECTOMY WITH RADIOACTIVE SEED LOCALIZATION;  Surgeon: Coralie Keens, MD;  Location: [facility];  Service: General;  Laterality: Right;  LMA  ? ? ?Family History  ?Problem Relation Age of Onset  ? Arthritis Mother   ? Asthma Mother   ? Cirrhosis Father   ?     d. 70  ? Depression Maternal Aunt   ? Hypertension Maternal Aunt   ? Breast cancer Maternal Aunt 51  ? Diabetes Maternal Grandfather   ? Heart disease Maternal Grandfather   ? Hypertension Maternal Grandfather   ? Stroke Maternal Grandfather   ? Breast cancer Maternal Aunt 33  ? Breast cancer Paternal Aunt   ? Breast cancer Cousin   ?     pat first cousin  ? ? ?Social History  ? ?Socioeconomic History  ? Marital status: Married  ?  Spouse name: Not on file  ? Number of children: 0  ? Years of education: Not on file  ? Highest education level: Not on file  ?  Occupational History  ? Not on file  ?Tobacco Use  ? Smoking status: Former  ? Smokeless tobacco: Never  ? Tobacco comments:  ?  social smoker  ?Vaping Use  ? Vaping Use: Never used  ?Substance and Sexual Activity  ? Alcohol use: Yes  ?  Comment: occasionally  ? Drug use: Yes  ?  Types: Marijuana  ? Sexual activity: Yes  ?  Birth control/protection: None  ?Other Topics Concern  ? Not on file  ?Social History Narrative  ? Not on file  ? ?Social Determinants of Health  ? ?Financial Resource Strain: Not on file  ?Food  Insecurity: Not on file  ?Transportation Needs: Not on file  ?Physical Activity: Not on file  ?Stress: Not on file  ?Social Connections: Not on file  ?Intimate Partner Violence: Not on file  ? ?Current Outpatient Medications:  ?  amLODipine-benazepril (LOTREL) 5-10 MG capsule, TAKE 1 CAPSULE BY MOUTH EVERY DAY, Disp: 90 capsule, Rfl: 2 ?  benzonatate (TESSALON) 100 MG capsule, Take 1 capsule (100 mg total) by mouth 3 (three) times daily as needed., Disp: 30 capsule, Rfl: 0 ?  cetirizine (ZYRTEC ALLERGY) 10 MG tablet, Take 1 tablet (10 mg total) by mouth daily., Disp: 90 tablet, Rfl: 1 ?  fluticasone (FLONASE) 50 MCG/ACT nasal spray, Place 2 sprays into both nostrils daily., Disp: 16 g, Rfl: 6 ?  gabapentin (NEURONTIN) 400 MG capsule, Take 1 capsule (400 mg total) by mouth 3 (three) times daily., Disp: 90 capsule, Rfl: 3 ?  ipratropium (ATROVENT) 0.03 % nasal spray, Place 2 sprays into both nostrils every 12 (twelve) hours., Disp: 30 mL, Rfl: 0 ?  loperamide (IMODIUM A-D) 2 MG tablet, Take 1 tablet (2 mg total) by mouth 4 (four) times daily as needed for diarrhea or loose stools., Disp: 30 tablet, Rfl: 0 ?  meclizine (ANTIVERT) 25 MG tablet, Take 1 tablet (25 mg total) by mouth 3 (three) times daily as needed for dizziness., Disp: 90 tablet, Rfl: 1 ?  propranolol (INDERAL) 40 MG tablet, TAKE 1/2 TABLET BY MOUTH DAILY, Disp: 45 tablet, Rfl: 1 ? ?EXAM: ? ?VITALS per patient if applicable:Ht '5\' 7"'$  (1.702 m)   BMI 37.66 kg/m?  ? ?GENERAL: alert, oriented, appears well and in no acute distress ? ?HEENT: atraumatic, conjunctiva clear, no obvious abnormalities on inspection. ? ?NECK: normal movements of the head and neck ? ?LUNGS: on inspection no signs of respiratory distress, breathing rate appears normal, no obvious gross SOB, gasping or wheezing ? ?CV: no obvious cyanosis ? ?Sue: moves all visible extremities without noticeable abnormality ? ?PSYCH/NEURO: pleasant and cooperative, no obvious depression or anxiety,  speech and thought processing grossly intact ? ?ASSESSMENT AND PLAN: ? ?Discussed the following assessment and plan: ? ?Headache, unspecified headache type ?Problem has improved. ?? Tension like headache. ?Keep appt with neurologist. ? ?Hypertension, essential, benign ?In general adequately controlled, elevated during times of stress. ?Continue Propranolol, Amlodipine,and Benazepril same dose. ?Low salt diet to continue. ?Letter will be provided as requested. ? ?Dizziness ?Dx'ed with vestibular migraines. ?Problem has improved but afraid of recurrence if she works at "[employer]." ?Keep appt with neurologist. ? ?We discussed possible serious and likely etiologies, options for evaluation and workup, limitations of telemedicine visit vs in person visit, treatment, treatment risks and precautions. ?The patient was advised to call back or seek an in-person evaluation if the symptoms worsen or if the condition fails to improve as anticipated. ?I discussed the assessment and treatment plan with the patient.  The patient was provided an opportunity to ask questions and all were answered. The patient agreed with the plan and demonstrated an understanding of the instructions. ? ?Return if symptoms worsen or fail to improve, for Keep next f/u appt.. ? ?Korynn Kenedy G. [place], MD ? ?[place]. ?[place] office. ? ? ?

## 2021-11-27 ENCOUNTER — Telehealth: Payer: 59 | Admitting: Physician Assistant

## 2021-11-27 DIAGNOSIS — G43809 Other migraine, not intractable, without status migrainosus: Secondary | ICD-10-CM

## 2021-11-27 DIAGNOSIS — R42 Dizziness and giddiness: Secondary | ICD-10-CM | POA: Diagnosis not present

## 2021-11-27 MED ORDER — MECLIZINE HCL 25 MG PO TABS: 25.0000 mg | ORAL_TABLET | Freq: Three times a day (TID) | ORAL | 1 refills | Status: DC | PRN

## 2021-11-27 NOTE — Progress Notes (Signed)
?Virtual Visit Consent  ? ?[name], you are scheduled for a virtual visit with a [facility] provider today.   ?  ?Just as with appointments in the office, your consent must be obtained to participate.  Your consent will be active for this visit and any virtual visit you may have with one of our providers in the next 365 days.   ?  ?If you have a MyChart account, a copy of this consent can be sent to you electronically.  All virtual visits are billed to your insurance company just like a traditional visit in the office.   ? ?As this is a virtual visit, video technology does not allow for your provider to perform a traditional examination.  This may limit your provider's ability to fully assess your condition.  If your provider identifies any concerns that need to be evaluated in person or the need to arrange testing (such as labs, EKG, etc.), we will make arrangements to do so.   ?  ?Although advances in technology are sophisticated, we cannot ensure that it will always work on either your end or our end.  If the connection with a video visit is poor, the visit may have to be switched to a telephone visit.  With either a video or telephone visit, we are not always able to ensure that we have a secure connection.    ? ?Also, by engaging in this virtual visit, you consent to the provision of healthcare. Additionally, you authorize for your insurance to be billed (if applicable) for the services provided during this visit.  ? ?I need to obtain your verbal consent now.   Are you willing to proceed with your visit today?  ?  ?Sue Jackson has provided verbal consent on 11/27/2021 for a virtual visit (video or telephone). ?  ?Leeanne Rio, PA-C  ? ?Date: 11/27/2021 2:39 PM ? ? ?Virtual Visit via Video Note  ? ?I, Leeanne Rio, connected with  Sue Jackson  ([id number], 11-30-1980) on 11/27/21 at  2:30 PM EDT by a video-enabled telemedicine application and verified that I  am speaking with the correct person using two identifiers. ? ?Location: ?Patient: Virtual Visit Location Patient: Home ?Provider: Virtual Visit Location Provider: Home Office ?  ?I discussed the limitations of evaluation and management by telemedicine and the availability of in person appointments. The patient expressed understanding and agreed to proceed.   ? ?History of Present Illness: ?Sue Jackson is a 41 y.o. who identifies as a female who was assigned female at birth, and is being seen today for migraine. Endorses history of significant vestibular migraine with vertigo and occasional lightheadedness. Current episode started within past couple of days and has been moderate. Notes photophobia and phonophobia without any eye pain, vision changes or headache, which is typical for her. Has upcoming Neurology appointment next month with Neurology. Is out of her Meclizine which she has from her PCP for acute vestibular migraines. Is requesting a refill.  ? ?History of HTN -- on multi-drug regimen. Taking as directed and tolerating well. Has been checking her BP occasionally at home and noting has been in a good range -- cannot give numbers. Denies chest pain, palpitations, SOB or syncope.   ? ?06/22/2020 -- Normal Brain MRI.  ? ? ? ?HPI: HPI  ?Problems:  ?Patient Active Problem List  ? Diagnosis Date Noted  ? Malignant neoplasm of right female breast, unspecified estrogen receptor status, unspecified site of breast ([place]) 08/13/2021  ?  Genetic testing 08/20/2020  ? Family history of breast cancer   ? 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 Bipolar 2 disorder ([place]) 11/16/2019  ? Prediabetes 08/15/2018  ? Anxiety 02/06/2018  ? Low back pain radiating to right lower extremity 07/06/2017  ? Upper back pain, chronic 07/06/2017  ? Hypertension, essential, benign 03/11/2017  ? Bipolar disorder with depression ([place]) 03/11/2017  ? Severe obesity (BMI 35.0-35.9 with comorbidity) ([place])  03/11/2017  ?  ?Allergies: No Known Allergies ?Medications:  ?Current Outpatient Medications:  ?  amLODipine-benazepril (LOTREL) 5-10 MG capsule, TAKE 1 CAPSULE BY MOUTH EVERY DAY, Disp: 90 capsule, Rfl: 2 ?  benzonatate (TESSALON) 100 MG capsule, Take 1 capsule (100 mg total) by mouth 3 (three) times daily as needed., Disp: 30 capsule, Rfl: 0 ?  cetirizine (ZYRTEC ALLERGY) 10 MG tablet, Take 1 tablet (10 mg total) by mouth daily., Disp: 90 tablet, Rfl: 1 ?  fluticasone (FLONASE) 50 MCG/ACT nasal spray, Place 2 sprays into both nostrils daily., Disp: 16 g, Rfl: 6 ?  gabapentin (NEURONTIN) 400 MG capsule, Take 1 capsule (400 mg total) by mouth 3 (three) times daily., Disp: 90 capsule, Rfl: 3 ?  ipratropium (ATROVENT) 0.03 % nasal spray, Place 2 sprays into both nostrils every 12 (twelve) hours., Disp: 30 mL, Rfl: 0 ?  loperamide (IMODIUM A-D) 2 MG tablet, Take 1 tablet (2 mg total) by mouth 4 (four) times daily as needed for diarrhea or loose stools., Disp: 30 tablet, Rfl: 0 ?  meclizine (ANTIVERT) 25 MG tablet, Take 1 tablet (25 mg total) by mouth 3 (three) times daily as needed for dizziness., Disp: 90 tablet, Rfl: 1 ?  propranolol (INDERAL) 40 MG tablet, TAKE 1/2 TABLET BY MOUTH DAILY, Disp: 45 tablet, Rfl: 1 ? ?Observations/Objective: ?Patient is well-developed, well-nourished in no acute distress.  ?Resting comfortably on bed at home.  ?Head is normocephalic, atraumatic.  ?No labored breathing. ?Speech is clear and coherent with logical content.  ?Patient is alert and oriented at baseline.  ? ?Assessment and Plan: ?1. Vestibular migraine ? ?Acute migraine symptoms that are classic for her. No new symptoms or any alarm signs/symptoms. Has upcoming evaluation with Neurology. Recent follow-up with PCP 2 days ago with no changes in medications. Supportive measures and OTC medications reviewed. Will refill her Meclizine to use as directed. Strict ER precautions discussed with patient.  ? ?Follow Up Instructions: ?I  discussed the assessment and treatment plan with the patient. The patient was provided an opportunity to ask questions and all were answered. The patient agreed with the plan and demonstrated an understanding of the instructions.  A copy of instructions were sent to the patient via MyChart unless otherwise noted below.  ? ?The patient was advised to call back or seek an in-person evaluation if the symptoms worsen or if the condition fails to improve as anticipated. ? ?Time:  ?I spent 12 minutes with the patient via telehealth technology discussing the above problems/concerns.   ? ?Leeanne Rio, PA-C ?

## 2021-11-27 NOTE — Patient Instructions (Signed)
?  [name], thank you for joining Leeanne Rio, PA-C for today's virtual visit.  While this provider is not your primary care provider (PCP), if your PCP is located in our provider database this encounter information will be shared with them immediately following your visit. ? ?Consent: ?(Patient) Sue Jackson provided verbal consent for this virtual visit at the beginning of the encounter. ? ?Current Medications: ? ?Current Outpatient Medications:  ?  amLODipine-benazepril (LOTREL) 5-10 MG capsule, TAKE 1 CAPSULE BY MOUTH EVERY DAY, Disp: 90 capsule, Rfl: 2 ?  benzonatate (TESSALON) 100 MG capsule, Take 1 capsule (100 mg total) by mouth 3 (three) times daily as needed., Disp: 30 capsule, Rfl: 0 ?  cetirizine (ZYRTEC ALLERGY) 10 MG tablet, Take 1 tablet (10 mg total) by mouth daily., Disp: 90 tablet, Rfl: 1 ?  fluticasone (FLONASE) 50 MCG/ACT nasal spray, Place 2 sprays into both nostrils daily., Disp: 16 g, Rfl: 6 ?  gabapentin (NEURONTIN) 400 MG capsule, Take 1 capsule (400 mg total) by mouth 3 (three) times daily., Disp: 90 capsule, Rfl: 3 ?  ipratropium (ATROVENT) 0.03 % nasal spray, Place 2 sprays into both nostrils every 12 (twelve) hours., Disp: 30 mL, Rfl: 0 ?  loperamide (IMODIUM A-D) 2 MG tablet, Take 1 tablet (2 mg total) by mouth 4 (four) times daily as needed for diarrhea or loose stools., Disp: 30 tablet, Rfl: 0 ?  meclizine (ANTIVERT) 25 MG tablet, Take 1 tablet (25 mg total) by mouth 3 (three) times daily as needed for dizziness., Disp: 30 tablet, Rfl: 1 ?  propranolol (INDERAL) 40 MG tablet, TAKE 1/2 TABLET BY MOUTH DAILY, Disp: 45 tablet, Rfl: 1  ? ?Medications ordered in this encounter:  ?Meds ordered this encounter  ?Medications  ? meclizine (ANTIVERT) 25 MG tablet  ?  Sig: Take 1 tablet (25 mg total) by mouth 3 (three) times daily as needed for dizziness.  ?  Dispense:  30 tablet  ?  Refill:  1  ?  Order Specific Question:   Supervising Provider  ?  Answer:    Noemi Chapel [[id number]]  ?  ? ?*If you need refills on other medications prior to your next appointment, please contact your pharmacy* ? ?Follow-Up: ?Call back or seek an in-person evaluation if the symptoms worsen or if the condition fails to improve as anticipated. ? ?Other Instructions ?Please increase fluids and rest. ?Drink a little bit of caffeine. ?Take the Meclizine as directed. ? ?Follow-up with your PCP and Neurology as scheduled. ? ?If any "new" migraine symptoms or any severe dizziness -- you need to be evaluated in person ASAP. DO NOT DELAY CARE. ? ? ?If you have been instructed to have an in-person evaluation today at a local Urgent Care facility, please use the link below. It will take you to a list of all of our available [facility], including address, phone number and hours of operation. Please do not delay care.  ?[facility] ? ?If you or a family member do not have a primary care provider, use the link below to schedule a visit and establish care. When you choose a [facility] primary care physician or advanced practice provider, you gain a long-term partner in health. ?Find a Primary Care Provider ? ?Learn more about [facility]'s in-office and virtual care options: ?[facility]  ?

## 2021-12-07 ENCOUNTER — Other Ambulatory Visit (HOSPITAL_COMMUNITY): Payer: Self-pay | Admitting: Psychiatry

## 2021-12-07 DIAGNOSIS — F3181 Bipolar II disorder: Secondary | ICD-10-CM

## 2021-12-07 DIAGNOSIS — F419 Anxiety disorder, unspecified: Secondary | ICD-10-CM

## 2021-12-09 ENCOUNTER — Ambulatory Visit: Payer: 59 | Admitting: Family

## 2021-12-15 NOTE — Progress Notes (Signed)
? ?NEUROLOGY CONSULTATION NOTE ? ?Sue Jackson ?MRN: [id number] ?DOB: Nov 16, 1980 ? ?Referring provider: Betty Martinique, MD ?Primary care provider: Betty Martinique, MD ? ?Reason for consult:  dizziness ? ?Assessment/Plan:  ? ?Vestibular migraines ? ?Start [employer].  Already has tried propranolol and topiramate.  Due to having hypertension, would avoid Aimovig if possible. ?Limit use of meclizine to no more than 2 days out of week ?Consider magnesium citrate '400mg'$  daily, riboflavin '400mg'$  daily and Co Q-10 '100mg'$  three times daily ?Hydration, no skipped meals ?Follow up 4-5 months ? ? ?Subjective:  ?Sue Jackson is a 41 year old female with HTN, DCIS, Bipolar disorder, anxiety who presents for dizziness.  History supplemented by referring provider's note. ? ?For many years she has been experiencing dizziness.  She describes an undulating sensation associated with photophobia, phonophobia, and sometimes nausea and stuttering speech but no visual disturbance, vomiting, numbness or weakness.  Rarely may have an occipital pressure headache.  Usually lasts a day.  Progressively have gotten worse over the past few years, now occurring daily.  Triggers include chocolate, nuts, citrus fruits, skipped meals, sleep deprivation, and heat.  Resting in cool dark and quiet room helps.  She saw ENT who told her that she has vestibular migraines.  Takes meclizine which helps.  Used to take frequently but now no more than once a week. ? ?MRI of brain without contrast on 06/22/2020 personally reviewed was normal. ? ?Current NSAIDS/analgesics:  none ?Current triptans:  none ?Current ergotamine:  none ?Current anti-emetic:  none ?Current muscle relaxants:  none ?Current Antihypertensive medications:  propranolol '20mg'$  daily (for anxiety), amlodipine-benazepril ?Current Antidepressant medications:  none ?Current Anticonvulsant medications:  gabapentin '400mg'$  TID (for anxiety) ?Current anti-CGRP:  none ?Current  Vitamins/Herbal/Supplements: none ?Current Antihistamines/Decongestants:  meclizine, Zyrtec, Flonase ?Other therapy:  none ?Hormone/birth control:  none ?Other medications:  none ? ?Past NSAIDS/analgesics:  meloxicam ?Past abortive triptans:  none ?Past abortive ergotamine:  none ?Past muscle relaxants:  Flexeril ?Past anti-emetic:  none ?Past antihypertensive medications:  none ?Past antidepressant medications:  sertraline, citalopram, fluoxetine ?Past anticonvulsant medications:  topiramate (side effects) ?Past anti-CGRP:  none ?Past vitamins/Herbal/Supplements:  none ?Past antihistamines/decongestants:  none ?Other past therapies:  chiropractor ? ?Caffeine:  decaf coffee ?Alcohol:  rarely ?Smoker:  sometimes marijuana.  No cigarettes ?Diet:  No soda.  Tries to drink water ?Exercise:  trying to increase ?Depression:  stable; Anxiety:  yes ?Other pain:  back pain/arthritis ?Sleep hygiene:  okay - sometimes anxiety may case insomnia. ?Works for [employer] ?Family history of headache:  sister (migraines), other sister (migraines, vertigo) ? ? ? ?PAST MEDICAL HISTORY: ?Past Medical History:  ?Diagnosis Date  ? Anemia 2019-2020  ? Anxiety   ? Arthritis   ? Bipolar disorder ([place]) 2013  ? Cancer [facility] 2021  ? Depression   ? Family history of breast cancer   ? Headache 2021  ? Hypertension   ? Personal history of radiation therapy   ? Pre-diabetes   ? ? ?PAST SURGICAL HISTORY: ?Past Surgical History:  ?Procedure Laterality Date  ? BREAST BIOPSY Right 04/09/2020  ? BREAST BIOPSY Left 04/24/2020  ? x2  ? BREAST LUMPECTOMY Right 06/12/2020  ? BREAST LUMPECTOMY WITH RADIOACTIVE SEED LOCALIZATION Right 06/12/2020  ? Procedure: RIGHT BREAST LUMPECTOMY WITH RADIOACTIVE SEED LOCALIZATION;  Surgeon: Coralie Keens, MD;  Location: [place];  Service: General;  Laterality: Right;  LMA  ? ? ?MEDICATIONS: ?Current Outpatient Medications on File Prior to Visit  ?Medication Sig Dispense Refill  ? amLODipine-benazepril (  LOTREL) 5-10  MG capsule TAKE 1 CAPSULE BY MOUTH EVERY DAY 90 capsule 2  ? benzonatate (TESSALON) 100 MG capsule Take 1 capsule (100 mg total) by mouth 3 (three) times daily as needed. 30 capsule 0  ? cetirizine (ZYRTEC ALLERGY) 10 MG tablet Take 1 tablet (10 mg total) by mouth daily. 90 tablet 1  ? fluticasone (FLONASE) 50 MCG/ACT nasal spray Place 2 sprays into both nostrils daily. 16 g 6  ? gabapentin (NEURONTIN) 400 MG capsule Take 1 capsule (400 mg total) by mouth 3 (three) times daily. 90 capsule 3  ? ipratropium (ATROVENT) 0.03 % nasal spray Place 2 sprays into both nostrils every 12 (twelve) hours. 30 mL 0  ? loperamide (IMODIUM A-D) 2 MG tablet Take 1 tablet (2 mg total) by mouth 4 (four) times daily as needed for diarrhea or loose stools. 30 tablet 0  ? meclizine (ANTIVERT) 25 MG tablet Take 1 tablet (25 mg total) by mouth 3 (three) times daily as needed for dizziness. 30 tablet 1  ? propranolol (INDERAL) 40 MG tablet TAKE 1/2 TABLET BY MOUTH DAILY 45 tablet 1  ? ?No current facility-administered medications on file prior to visit.  ? ? ?ALLERGIES: ?No Known Allergies ? ?FAMILY HISTORY: ?Family History  ?Problem Relation Age of Onset  ? Arthritis Mother   ? Asthma Mother   ? Cirrhosis Father   ?     d. 34  ? Depression Maternal Aunt   ? Hypertension Maternal Aunt   ? Breast cancer Maternal Aunt 51  ? Diabetes Maternal Grandfather   ? Heart disease Maternal Grandfather   ? Hypertension Maternal Grandfather   ? Stroke Maternal Grandfather   ? Breast cancer Maternal Aunt 21  ? Breast cancer Paternal Aunt   ? Breast cancer Cousin   ?     pat first cousin  ? ? ?Objective:  ?Blood pressure 127/75, pulse 60, height '5\' 7"'$  (1.702 m), weight 233 lb 3.2 oz (105.8 kg), SpO2 98 %. ?General: No acute distress.  Patient appears well-groomed.   ?Head:  Normocephalic/atraumatic ?Eyes:  fundi examined but not visualized ?Neck: supple, bilateral paraspinal tenderness, full range of motion ?Heart: regular rate and rhythm ?Neurological  Exam: ?Mental status: alert and oriented to person, place, and time, recent and remote memory intact, fund of knowledge intact, attention and concentration intact, speech fluent and not dysarthric, language intact. ?Cranial nerves: ?CN I: not tested ?CN II: pupils equal, round and reactive to light, visual fields intact ?CN III, IV, VI:  full range of motion, no nystagmus, no ptosis ?CN V: facial sensation intact. ?CN VII: upper and lower face symmetric ?CN VIII: hearing intact ?CN IX, X: gag intact, uvula midline ?CN XI: sternocleidomastoid and trapezius muscles intact ?CN XII: tongue midline ?Bulk & Tone: normal, no fasciculations. ?Motor:  muscle strength 5/5 throughout ?Sensation:  Pinprick, temperature and vibratory sensation intact. ?Deep Tendon Reflexes:  2+ throughout,  toes downgoing.   ?Finger to nose testing:  Without dysmetria.   ?Heel to shin:  Without dysmetria.   ?Gait:  Normal station and stride.  Romberg negative. ? ? ? ?Thank you for allowing me to take part in the care of this patient. ? ?Metta Clines, DO ? ?CC: Betty Martinique, MD ? ? ? ? ?

## 2021-12-16 ENCOUNTER — Ambulatory Visit: Payer: 59 | Admitting: Neurology

## 2021-12-16 ENCOUNTER — Encounter: Payer: Self-pay | Admitting: Neurology

## 2021-12-16 VITALS — BP 127/75 | HR 60 | Ht 67.0 in | Wt 233.2 lb

## 2021-12-16 DIAGNOSIS — G43809 Other migraine, not intractable, without status migrainosus: Secondary | ICD-10-CM

## 2021-12-16 MED ORDER — EMGALITY 120 MG/ML ~~LOC~~ SOAJ: 120.0000 mg | SUBCUTANEOUS | 5 refills | Status: DC

## 2021-12-16 MED ORDER — EMGALITY 120 MG/ML ~~LOC~~ SOAJ: 240.0000 mg | Freq: Once | SUBCUTANEOUS | 0 refills | Status: AC

## 2021-12-16 NOTE — Patient Instructions (Addendum)
?  Start Emgality - 2 injections for first dose, then 1 injection every 28 days thereafter.   ?Limit use of meclizine to no more than 2 days out of the week.   ?Be aware of common food triggers: ? - Caffeine:  coffee, black tea, cola, Mt. Dew ? - Chocolate ? - Dairy:  aged cheeses (brie, blue, cheddar, gouda, [place], provolone, [place], Swiss, etc), chocolate milk, buttermilk, sour cream, limit eggs and yogurt ? - Nuts, peanut butter ? - Alcohol ? - Cereals/grains:  FRESH breads (fresh bagels, sourdough, doughnuts), yeast productions ? - Processed/canned/aged/cured meats (pre-packaged deli meats, hotdogs) ? - MSG/glutamate:  soy sauce, flavor enhancer, pickled/preserved/marinated foods ? - Sweeteners:  aspartame (Equal, Nutrasweet).  Sugar and Splenda are okay ? - Vegetables:  legumes (lima beans, lentils, snow peas, fava beans, pinto peans, peas, garbanzo beans), sauerkraut, onions, olives, pickles ? - Fruit:  avocados, bananas, citrus fruit (orange, lemon, grapefruit), mango ? - Other:  Frozen meals, macaroni and cheese ?Routine exercise ?Stay adequately hydrated (aim for 64 oz water daily) ?Keep headache diary ?Maintain proper stress management ?Maintain proper sleep hygiene ?Do not skip meals ?Consider supplements:  magnesium citrate '400mg'$  daily, riboflavin '400mg'$  daily, coenzyme Q10 '100mg'$  three times daily. ?11. Follow up 4-5 months. ?

## 2021-12-17 ENCOUNTER — Telehealth (HOSPITAL_COMMUNITY): Payer: Self-pay

## 2021-12-17 ENCOUNTER — Telehealth (INDEPENDENT_AMBULATORY_CARE_PROVIDER_SITE_OTHER): Payer: 59 | Admitting: Psychiatry

## 2021-12-17 DIAGNOSIS — F3181 Bipolar II disorder: Secondary | ICD-10-CM | POA: Diagnosis not present

## 2021-12-17 DIAGNOSIS — F419 Anxiety disorder, unspecified: Secondary | ICD-10-CM

## 2021-12-17 MED ORDER — GABAPENTIN 400 MG PO CAPS: 400.0000 mg | ORAL_CAPSULE | Freq: Three times a day (TID) | ORAL | 0 refills | Status: DC

## 2021-12-17 MED ORDER — GABAPENTIN 100 MG PO CAPS: 100.0000 mg | ORAL_CAPSULE | Freq: Three times a day (TID) | ORAL | 0 refills | Status: DC

## 2021-12-17 NOTE — Progress Notes (Signed)
BH MD/PA/NP OP Progress Note ? ?12/17/2021 6:47 PM ?Sue Jackson  ?MRN:  [id number] ? ?Virtual Visit via Video Note ? ?I connected with Sue Jackson on 12/17/21 at  2:30 PM EDT by a video enabled telemedicine application and verified that I am speaking with the correct person using two identifiers. ? ?Location: ?Patient: home ?Provider: offsite ?  ?I discussed the limitations of evaluation and management by telemedicine and the availability of in person appointments. The patient expressed understanding and agreed to proceed. ? ? ?  ?I discussed the assessment and treatment plan with the patient. The patient was provided an opportunity to ask questions and all were answered. The patient agreed with the plan and demonstrated an understanding of the instructions. ?  ?The patient was advised to call back or seek an in-person evaluation if the symptoms worsen or if the condition fails to improve as anticipated. ? ?I provided 5 minutes of non-face-to-face time during this encounter. ? ? ?Franne Grip, NP  ? ?Chief Complaint: Medication management ? ?HPI: Takeira Yanes is a 41 year old female presenting to [facility] behavioral health outpatient for follow-up psychiatric evaluation.  Patient has a psychiatric history of bipolar disorder and anxiety.  Her symptoms are managed with gabapentin 400 mg 3 times daily.  Patient reports continued anxiety and is agreeable to increasing gabapentin today.  Patient reluctant to start any other medications to management anxiety as she had tried multiple medications in the past.  Gabapentin increased to 500 mg 3 times daily to manage anxiety symptoms.  Medication benefits versus risk discussed. ? ? ? ? ? ?Visit Diagnosis:  ?  ICD-10-CM   ?1. Anxiety  F41.9 gabapentin (NEURONTIN) 400 MG capsule  ?  ?2. Bipolar 2 disorder (HCC)  F31.81 gabapentin (NEURONTIN) 400 MG capsule  ?  ? ? ?Past Psychiatric History:  ? ?Past Medical History:  ?Past Medical History:   ?Diagnosis Date  ? Anemia 2019-2020  ? Anxiety   ? Arthritis   ? Bipolar disorder ([place]) 2013  ? Cancer [facility]) 2021  ? Depression   ? Family history of breast cancer   ? Headache 2021  ? Hypertension   ? Personal history of radiation therapy   ? Pre-diabetes   ?  ?Past Surgical History:  ?Procedure Laterality Date  ? BREAST BIOPSY Right 04/09/2020  ? BREAST BIOPSY Left 04/24/2020  ? x2  ? BREAST LUMPECTOMY Right 06/12/2020  ? BREAST LUMPECTOMY WITH RADIOACTIVE SEED LOCALIZATION Right 06/12/2020  ? Procedure: RIGHT BREAST LUMPECTOMY WITH RADIOACTIVE SEED LOCALIZATION;  Surgeon: Coralie Keens, MD;  Location: [place];  Service: General;  Laterality: Right;  LMA  ? ? ?Family Psychiatric History:  ? ?Family History:  ?Family History  ?Problem Relation Age of Onset  ? Arthritis Mother   ? Asthma Mother   ? Cirrhosis Father   ?     d. 50  ? Depression Maternal Aunt   ? Hypertension Maternal Aunt   ? Breast cancer Maternal Aunt 51  ? Breast cancer Maternal Aunt 44  ? Breast cancer Paternal Aunt   ? Dementia Maternal Grandmother   ? Diabetes Maternal Grandfather   ? Heart disease Maternal Grandfather   ? Hypertension Maternal Grandfather   ? Stroke Maternal Grandfather   ? Breast cancer Cousin   ?     pat first cousin  ? ? ?Social History:  ?Social History  ? ?Socioeconomic History  ? Marital status: Married  ?  Spouse name: Not on file  ? Number  of children: 0  ? Years of education: Not on file  ? Highest education level: Not on file  ?Occupational History  ? Not on file  ?Tobacco Use  ? Smoking status: Former  ? Smokeless tobacco: Never  ? Tobacco comments:  ?  social smoker  ?Vaping Use  ? Vaping Use: Never used  ?Substance and Sexual Activity  ? Alcohol use: Not Currently  ?  Comment: occasionally  ? Drug use: Yes  ?  Types: Marijuana  ? Sexual activity: Yes  ?  Birth control/protection: None  ?Other Topics Concern  ? Not on file  ?Social History Narrative  ? Right handed  ? ?Social Determinants of Health  ? ?Financial  Resource Strain: Not on file  ?Food Insecurity: Not on file  ?Transportation Needs: Not on file  ?Physical Activity: Not on file  ?Stress: Not on file  ?Social Connections: Not on file  ? ? ?Allergies: No Known Allergies ? ?Metabolic Disorder Labs: ?Lab Results  ?Component Value Date  ? HGBA1C 6.4 08/12/2021  ? ?No results found for: PROLACTIN ?Lab Results  ?Component Value Date  ? CHOL 134 01/30/2020  ? TRIG 62.0 01/30/2020  ? HDL 45.50 01/30/2020  ? CHOLHDL 3 01/30/2020  ? VLDL 12.4 01/30/2020  ? [place] 76 01/30/2020  ? [place] 95 03/11/2017  ? ?Lab Results  ?Component Value Date  ? TSH 2.51 11/21/2018  ? TSH 3.68 03/11/2017  ? ? ?Therapeutic Level Labs: ?No results found for: LITHIUM ?No results found for: VALPROATE ?No components found for:  CBMZ ? ?Current Medications: ?Current Outpatient Medications  ?Medication Sig Dispense Refill  ? gabapentin (NEURONTIN) 100 MG capsule Take 1 capsule (100 mg total) by mouth 3 (three) times daily. 90 capsule 0  ? amLODipine-benazepril (LOTREL) 5-10 MG capsule TAKE 1 CAPSULE BY MOUTH EVERY DAY 90 capsule 2  ? cetirizine (ZYRTEC ALLERGY) 10 MG tablet Take 1 tablet (10 mg total) by mouth daily. 90 tablet 1  ? fluticasone (FLONASE) 50 MCG/ACT nasal spray Place 2 sprays into both nostrils daily. 16 g 6  ? gabapentin (NEURONTIN) 400 MG capsule Take 1 capsule (400 mg total) by mouth 3 (three) times daily. 90 capsule 0  ? Galcanezumab-gnlm (EMGALITY) 120 MG/ML SOAJ Inject 120 mg into the skin every 28 (twenty-eight) days. 1.12 mL 5  ? ipratropium (ATROVENT) 0.03 % nasal spray Place 2 sprays into both nostrils every 12 (twelve) hours. 30 mL 0  ? loperamide (IMODIUM A-D) 2 MG tablet Take 1 tablet (2 mg total) by mouth 4 (four) times daily as needed for diarrhea or loose stools. 30 tablet 0  ? meclizine (ANTIVERT) 25 MG tablet Take 1 tablet (25 mg total) by mouth 3 (three) times daily as needed for dizziness. 30 tablet 1  ? propranolol (INDERAL) 40 MG tablet TAKE 1/2 TABLET BY MOUTH  DAILY 45 tablet 1  ? ?No current facility-administered medications for this visit.  ? ? ? ?Musculoskeletal: ?Strength & Muscle Tone: N/A virtual visit ?Gait & Station: N/A ?Patient leans: N/A ? ?Psychiatric Specialty Exam: ?Review of Systems  ?Psychiatric/Behavioral:  Negative for hallucinations, self-injury and suicidal ideas. The patient is nervous/anxious.   ?All other systems reviewed and are negative.  ?There were no vitals taken for this visit.There is no height or weight on file to calculate BMI.  ?General Appearance: Well Groomed  ?Eye Contact:  Good  ?Speech:  Clear and Coherent  ?Volume:  Normal  ?Mood:  Anxious  ?Affect:  Congruent  ?Thought Process:  Goal  Directed  ?Orientation:  Full (Time, Place, and Person)  ?Thought Content: Logical   ?Suicidal Thoughts:  No  ?Homicidal Thoughts:  No  ?Memory: Good  ?Judgement:  Good  ?Insight:  Good  ?Psychomotor Activity:  NA  ?Concentration: Good  ?Recall:  Good  ?Fund of Knowledge: Good  ?Language: Good  ?Akathisia:  NA  ?Handed:  Right  ?AIMS (if indicated): not done  ?Assets:  Communication Skills ?Desire for Improvement  ?ADL's:  Intact  ?Cognition: WNL  ?Sleep:  Good  ? ?Screenings: ?GAD-7   ? ?Flowsheet Row Video Visit from 10/01/2021 in [facility] Counselor from 09/02/2021 in [facility] Video Visit from 07/13/2021 in [facility] Video Visit from 04/27/2021 in [facility]  ?Total GAD-7 Score '9 14 16 21  '$ ? ?  ? ?PHQ2-9   ? ?Flowsheet Row Video Visit from 10/01/2021 in [facility] Office Visit from 08/12/2021 in [place] [facility] [place] Video Visit from 07/13/2021 in [facility] Video Visit from 04/27/2021 in [facility] Navigator from 04/16/2020 in [facility]  ?PHQ-2 Total Score '2 3 3 2 1  '$ ?PHQ-9 Total Score '4 16 13 12  '$ --  ? ?  ? ?Flowsheet Row Video Visit from 10/01/2021 in [facility] Video Visit from 07/13/2021 in [facility] Video Visit from 04/27/2021 in [place]

## 2021-12-17 NOTE — Telephone Encounter (Signed)
Medication management - Call from [facility], pharmacist at [place] requesting clarification if patient is to stop Gabapentiin past order for 400 mg and just take the new 100 mg three times a day.  ?

## 2021-12-18 ENCOUNTER — Encounter: Payer: Self-pay | Admitting: Neurology

## 2021-12-19 ENCOUNTER — Telehealth: Payer: 59 | Admitting: Urgent Care

## 2021-12-19 DIAGNOSIS — R400 Somnolence: Secondary | ICD-10-CM | POA: Diagnosis not present

## 2021-12-19 DIAGNOSIS — T887XXA Unspecified adverse effect of drug or medicament, initial encounter: Secondary | ICD-10-CM | POA: Diagnosis not present

## 2021-12-19 NOTE — Progress Notes (Signed)
?Virtual Visit Consent  ? ?Sue Jackson, you are scheduled for a virtual visit with a [facility] provider today. Just as with appointments in the office, your consent must be obtained to participate. Your consent will be active for this visit and any virtual visit you may have with one of our providers in the next 365 days. If you have a MyChart account, a copy of this consent can be sent to you electronically. ? ?As this is a virtual visit, video technology does not allow for your provider to perform a traditional examination. This may limit your provider's ability to fully assess your condition. If your provider identifies any concerns that need to be evaluated in person or the need to arrange testing (such as labs, EKG, etc.), we will make arrangements to do so. Although advances in technology are sophisticated, we cannot ensure that it will always work on either your end or our end. If the connection with a video visit is poor, the visit may have to be switched to a telephone visit. With either a video or telephone visit, we are not always able to ensure that we have a secure connection. ? ?By engaging in this virtual visit, you consent to the provision of healthcare and authorize for your insurance to be billed (if applicable) for the services provided during this visit. Depending on your insurance coverage, you may receive a charge related to this service. ? ?I need to obtain your verbal consent now. Are you willing to proceed with your visit today? Sue Jackson has provided verbal consent on 12/19/2021 for a virtual visit (video or telephone). [place], PA ? ?Date: 12/19/2021 2:12 PM ? ?Virtual Visit via Video Note  ? ?I, [place], connected with  Sue Jackson  ([id number], 1981/03/01) on 12/19/21 at  2:00 PM EDT by a video-enabled telemedicine application and verified that I am speaking with the correct person using two identifiers. ? ?Location: ?Patient:  Virtual Visit Location Patient: Home ?Provider: Virtual Visit Location Provider: Home Office ?  ?I discussed the limitations of evaluation and management by telemedicine and the availability of in person appointments. The patient expressed understanding and agreed to proceed.   ? ?History of Present Illness: ?Sue Jackson is a 41 y.o. who identifies as a female who was assigned female at birth, and is being seen today for work note secondary to adverse effects of medication. ? ?HPI: Pleasant 41yo female presents today reporting sedation and fatigue. She suffers from anxiety and bipolar disorder. She saw her behavioral health provider on 12/17/21. Pt was previously taking '400mg'$  gabapentin three times daily to help with her chronic conditions. 2 days ago, she was increased to '500mg'$  TID. Pt states she did not start the increased dose until yesterday. She feels so far it has been effective in managing her anxiety, but notes severe sedation as a side effects. She notes that this occurs every time she has a dosage change and usually lasts only 2-3 days until her body adjusts. She denies headache, SOB, nausea, or any concerning symptoms. She reports this feels similar to her prior dosage increases. She is primarily requesting a work note today and is hoping to return tomorrow. ? ? ?Problems:  ?Patient Active Problem List  ? Diagnosis Date Noted  ? Malignant neoplasm of right female breast, unspecified estrogen receptor status, unspecified site of breast ([place]) 08/13/2021  ? Genetic testing 08/20/2020  ? Family history of breast cancer   ? Ductal carcinoma  in situ (DCIS) of right breast 04/11/2020  ? Bipolar 2 disorder ([place]) 11/16/2019  ? Prediabetes 08/15/2018  ? Anxiety 02/06/2018  ? Low back pain radiating to right lower extremity 07/06/2017  ? Upper back pain, chronic 07/06/2017  ? Hypertension, essential, benign 03/11/2017  ? Bipolar disorder with depression ([place]) 03/11/2017  ? Severe obesity (BMI 35.0-35.9  with comorbidity) ([place]) 03/11/2017  ?  ?Allergies: No Known Allergies ?Medications:  ?Current Outpatient Medications:  ?  amLODipine-benazepril (LOTREL) 5-10 MG capsule, TAKE 1 CAPSULE BY MOUTH EVERY DAY, Disp: 90 capsule, Rfl: 2 ?  cetirizine (ZYRTEC ALLERGY) 10 MG tablet, Take 1 tablet (10 mg total) by mouth daily., Disp: 90 tablet, Rfl: 1 ?  fluticasone (FLONASE) 50 MCG/ACT nasal spray, Place 2 sprays into both nostrils daily., Disp: 16 g, Rfl: 6 ?  gabapentin (NEURONTIN) 100 MG capsule, Take 1 capsule (100 mg total) by mouth 3 (three) times daily., Disp: 90 capsule, Rfl: 0 ?  gabapentin (NEURONTIN) 400 MG capsule, Take 1 capsule (400 mg total) by mouth 3 (three) times daily., Disp: 90 capsule, Rfl: 0 ?  Galcanezumab-gnlm (EMGALITY) 120 MG/ML SOAJ, Inject 120 mg into the skin every 28 (twenty-eight) days., Disp: 1.12 mL, Rfl: 5 ?  ipratropium (ATROVENT) 0.03 % nasal spray, Place 2 sprays into both nostrils every 12 (twelve) hours., Disp: 30 mL, Rfl: 0 ?  loperamide (IMODIUM A-D) 2 MG tablet, Take 1 tablet (2 mg total) by mouth 4 (four) times daily as needed for diarrhea or loose stools., Disp: 30 tablet, Rfl: 0 ?  meclizine (ANTIVERT) 25 MG tablet, Take 1 tablet (25 mg total) by mouth 3 (three) times daily as needed for dizziness., Disp: 30 tablet, Rfl: 1 ?  propranolol (INDERAL) 40 MG tablet, TAKE 1/2 TABLET BY MOUTH DAILY, Disp: 45 tablet, Rfl: 1 ? ?Observations/Objective: ?Patient is well-developed, well-nourished in no acute distress.  ?Resting comfortably NAD at home, appears tired.  ?Head is normocephalic, atraumatic.  ?No labored breathing. No cough ?Speech is clear and coherent with logical content.  ?Patient is alert and oriented at baseline.  ?No rhinorrhea or tearing ? ?Assessment and Plan: ?1. Medication side effect ? ?2. Drowsiness ? ?Pt experiencing mild drowsiness on her increase of gabapentin. Has felt this way in the past with previous dose increases. States sx usually last 2-3 days. Is in need  of work note for today. Denies any concerning sx. Will call PCP Monday if any new or worrisome side effects occur ? ?Follow Up Instructions: ?I discussed the assessment and treatment plan with the patient. The patient was provided an opportunity to ask questions and all were answered. The patient agreed with the plan and demonstrated an understanding of the instructions.  A copy of instructions were sent to the patient via MyChart unless otherwise noted below.  ? ? ? ?The patient was advised to call back or seek an in-person evaluation if the symptoms worsen or if the condition fails to improve as anticipated. ? ?Time:  ?I spent 6 minutes with the patient via telehealth technology discussing the above problems/concerns.   ? ?Sue Mia L Deaaron Fulghum, PA  ?

## 2021-12-19 NOTE — Patient Instructions (Signed)
?  Sue Jackson, thank you for joining Chaney Malling, PA for today's virtual visit.  While this provider is not your primary care provider (PCP), if your PCP is located in our provider database this encounter information will be shared with them immediately following your visit. ? ?Consent: ?(Patient) Sue Jackson provided verbal consent for this virtual visit at the beginning of the encounter. ? ?Current Medications: ? ?Current Outpatient Medications:  ?  amLODipine-benazepril (LOTREL) 5-10 MG capsule, TAKE 1 CAPSULE BY MOUTH EVERY DAY, Disp: 90 capsule, Rfl: 2 ?  cetirizine (ZYRTEC ALLERGY) 10 MG tablet, Take 1 tablet (10 mg total) by mouth daily., Disp: 90 tablet, Rfl: 1 ?  fluticasone (FLONASE) 50 MCG/ACT nasal spray, Place 2 sprays into both nostrils daily., Disp: 16 g, Rfl: 6 ?  gabapentin (NEURONTIN) 100 MG capsule, Take 1 capsule (100 mg total) by mouth 3 (three) times daily., Disp: 90 capsule, Rfl: 0 ?  gabapentin (NEURONTIN) 400 MG capsule, Take 1 capsule (400 mg total) by mouth 3 (three) times daily., Disp: 90 capsule, Rfl: 0 ?  Galcanezumab-gnlm (EMGALITY) 120 MG/ML SOAJ, Inject 120 mg into the skin every 28 (twenty-eight) days., Disp: 1.12 mL, Rfl: 5 ?  ipratropium (ATROVENT) 0.03 % nasal spray, Place 2 sprays into both nostrils every 12 (twelve) hours., Disp: 30 mL, Rfl: 0 ?  loperamide (IMODIUM A-D) 2 MG tablet, Take 1 tablet (2 mg total) by mouth 4 (four) times daily as needed for diarrhea or loose stools., Disp: 30 tablet, Rfl: 0 ?  meclizine (ANTIVERT) 25 MG tablet, Take 1 tablet (25 mg total) by mouth 3 (three) times daily as needed for dizziness., Disp: 30 tablet, Rfl: 1 ?  propranolol (INDERAL) 40 MG tablet, TAKE 1/2 TABLET BY MOUTH DAILY, Disp: 45 tablet, Rfl: 1  ? ?Medications ordered in this encounter:  ?No orders of the defined types were placed in this encounter. ?  ? ?*If you need refills on other medications prior to your next appointment, please contact your  pharmacy* ? ?Follow-Up: ?Call back or seek an in-person evaluation if the symptoms worsen or if the condition fails to improve as anticipated. ? ?Other Instructions ?You were given a work note for today due to the adverse side effect you are experiencing from your medication. ?Given the history of similar symptoms in the past, continue to monitor for improvement. ?If any new or worrisome symptoms occur, please contact your PCP immediately.  ? ? ?If you have been instructed to have an in-person evaluation today at a local Urgent Care facility, please use the link below. It will take you to a list of all of our available [facility], including address, phone number and hours of operation. Please do not delay care.  ?[facility] ? ?If you or a family member do not have a primary care provider, use the link below to schedule a visit and establish care. When you choose a [facility] primary care physician or advanced practice provider, you gain a long-term partner in health. ?Find a Primary Care Provider ? ?Learn more about [facility]'s in-office and virtual care options: ?[facility]  ?

## 2021-12-22 ENCOUNTER — Telehealth: Payer: Self-pay | Admitting: Neurology

## 2021-12-22 NOTE — Telephone Encounter (Signed)
The following message was left with [employer] on 12/21/21 at 5:00 PM. ? ?Tammy from [place] states medication request is for [employer].  ?

## 2021-12-22 NOTE — Telephone Encounter (Signed)
Fax received as well. ?Sent fax back with message, Two scripts sent for this medication. ?Loading dose and maintenance dose.  ?

## 2021-12-24 NOTE — Telephone Encounter (Signed)
Medication management - [facility] to [facility] for them, per verbal clarification from Franne Grip, NP that patient is now taking both the 100 mg and 400 mg Gabapentin 3 times a day for a total of 500 mg three times a day.  Pharmacist stated understanding and made a note to fill both prescriptions as ordered.

## 2021-12-28 NOTE — Telephone Encounter (Signed)
Medication refilled on 5/11 by [facility]

## 2022-01-01 ENCOUNTER — Telehealth: Payer: 59 | Admitting: Physician Assistant

## 2022-01-01 DIAGNOSIS — G43809 Other migraine, not intractable, without status migrainosus: Secondary | ICD-10-CM | POA: Diagnosis not present

## 2022-01-01 NOTE — Progress Notes (Signed)
Virtual Visit Consent   Sue Jackson, you are scheduled for a virtual visit with a [facility] provider today. Just as with appointments in the office, your consent must be obtained to participate. Your consent will be active for this visit and any virtual visit you may have with one of our providers in the next 365 days. If you have a MyChart account, a copy of this consent can be sent to you electronically.  As this is a virtual visit, video technology does not allow for your provider to perform a traditional examination. This may limit your provider's ability to fully assess your condition. If your provider identifies any concerns that need to be evaluated in person or the need to arrange testing (such as labs, EKG, etc.), we will make arrangements to do so. Although advances in technology are sophisticated, we cannot ensure that it will always work on either your end or our end. If the connection with a video visit is poor, the visit may have to be switched to a telephone visit. With either a video or telephone visit, we are not always able to ensure that we have a secure connection.  By engaging in this virtual visit, you consent to the provision of healthcare and authorize for your insurance to be billed (if applicable) for the services provided during this visit. Depending on your insurance coverage, you may receive a charge related to this service.  I need to obtain your verbal consent now. Are you willing to proceed with your visit today? Sue Jackson has provided verbal consent on 01/01/2022 for a virtual visit (video or telephone). Sue Jackson, [place]  Date: 01/01/2022 1:55 PM  Virtual Visit via Video Note   I, Sue Jackson, connected with  Sue Jackson  ([id number], 1980/09/22) on 01/01/22 at  1:45 PM EDT by a video-enabled telemedicine application and verified that I am speaking with the correct person using two  identifiers.  Location: Patient: Virtual Visit Location Patient: Home Provider: Virtual Visit Location Provider: Home Office   I discussed the limitations of evaluation and management by telemedicine and the availability of in person appointments. The patient expressed understanding and agreed to proceed.    History of Present Illness: Sue Jackson is a 41 y.o. who identifies as a female who was assigned female at birth, and is being seen today for an acute migraine. Has history of chronic vestibular migraines, for which she is followed by Neurology and was just started on Emgality a few weeks ago. Was told to give it a month to fully work but is now having an acute migraine which for her is some lightheadedness/dizziness with mild headache. Denies any new or abnormal symptoms from her prior episodes. Does note that she skipped her meals last night and slept in later today so has not eaten in sometime which has been a trigger for her. Takes meclizine for acute symptoms but has not taken a dose yet as she wanted to speak with a provider first. Does not feel she can make it into work this evening.   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   Bipolar 2 disorder ([place]) 11/16/2019   Prediabetes 08/15/2018  Anxiety 02/06/2018   Low back pain radiating to right lower extremity 07/06/2017   Upper back pain, chronic 07/06/2017   Hypertension, essential, benign 03/11/2017   Bipolar disorder with depression ([place]) 03/11/2017   Severe obesity (BMI 35.0-35.9 with comorbidity) ([place]) 03/11/2017    Allergies: No Known Allergies Medications:  Current Outpatient Medications:    amLODipine-benazepril (LOTREL) 5-10  MG capsule, TAKE 1 CAPSULE BY MOUTH EVERY DAY, Disp: 90 capsule, Rfl: 2   cetirizine (ZYRTEC ALLERGY) 10 MG tablet, Take 1 tablet (10 mg total) by mouth daily., Disp: 90 tablet, Rfl: 1   fluticasone (FLONASE) 50 MCG/ACT nasal spray, Place 2 sprays into both nostrils daily., Disp: 16 g, Rfl: 6   gabapentin (NEURONTIN) 100 MG capsule, Take 1 capsule (100 mg total) by mouth 3 (three) times daily., Disp: 90 capsule, Rfl: 0   gabapentin (NEURONTIN) 400 MG capsule, Take 1 capsule (400 mg total) by mouth 3 (three) times daily., Disp: 90 capsule, Rfl: 0   Galcanezumab-gnlm (EMGALITY) 120 MG/ML SOAJ, Inject 120 mg into the skin every 28 (twenty-eight) days., Disp: 1.12 mL, Rfl: 5   ipratropium (ATROVENT) 0.03 % nasal spray, Place 2 sprays into both nostrils every 12 (twelve) hours., Disp: 30 mL, Rfl: 0   loperamide (IMODIUM A-D) 2 MG tablet, Take 1 tablet (2 mg total) by mouth 4 (four) times daily as needed for diarrhea or loose stools., Disp: 30 tablet, Rfl: 0   meclizine (ANTIVERT) 25 MG tablet, Take 1 tablet (25 mg total) by mouth 3 (three) times daily as needed for dizziness., Disp: 30 tablet, Rfl: 1   propranolol (INDERAL) 40 MG tablet, TAKE 1/2 TABLET BY MOUTH DAILY, Disp: 45 tablet, Rfl: 1  Observations/Objective: Patient is well-developed, well-nourished in no acute distress.  Resting comfortably at home.  Head is normocephalic, atraumatic.  No labored breathing. Speech is clear and coherent with logical content.  Patient is alert and oriented at baseline.   Assessment and Plan: 1. Other migraine without status migrainosus, not intractable  Flare of her chronic vestibular migraine, mild. Triggered most likely by abnormal amount of sleep and going too long without a meal. Restart meclizine. Hydrate, rest and she is to eat meal with protein. Avoid skipping meals. Work note written for the evening. She is to follow-up with her Neurologist regarding ongoing migraines. Strict ER precautions reviewed.    Follow Up Instructions: I discussed the assessment and treatment plan with the patient. The patient was provided an opportunity to ask questions and all were answered. The patient agreed with the plan and demonstrated an understanding of the instructions.  A copy of instructions were sent to the patient via MyChart unless otherwise noted below.   The patient was advised to call back or seek an in-person evaluation if the symptoms worsen or if the condition fails to improve as anticipated.  Time:  I spent 10 minutes with the patient via telehealth technology discussing the above problems/concerns.    Sue Rio, PA-C

## 2022-01-01 NOTE — Patient Instructions (Signed)
  [name], thank you for joining Leeanne Rio, PA-C for today's virtual visit.  While this provider is not your primary care provider (PCP), if your PCP is located in our provider database this encounter information will be shared with them immediately following your visit.  Consent: (Patient) Sue Jackson provided verbal consent for this virtual visit at the beginning of the encounter.  Current Medications:  Current Outpatient Medications:    amLODipine-benazepril (LOTREL) 5-10 MG capsule, TAKE 1 CAPSULE BY MOUTH EVERY DAY, Disp: 90 capsule, Rfl: 2   cetirizine (ZYRTEC ALLERGY) 10 MG tablet, Take 1 tablet (10 mg total) by mouth daily., Disp: 90 tablet, Rfl: 1   fluticasone (FLONASE) 50 MCG/ACT nasal spray, Place 2 sprays into both nostrils daily., Disp: 16 g, Rfl: 6   gabapentin (NEURONTIN) 100 MG capsule, Take 1 capsule (100 mg total) by mouth 3 (three) times daily., Disp: 90 capsule, Rfl: 0   gabapentin (NEURONTIN) 400 MG capsule, Take 1 capsule (400 mg total) by mouth 3 (three) times daily., Disp: 90 capsule, Rfl: 0   Galcanezumab-gnlm (EMGALITY) 120 MG/ML SOAJ, Inject 120 mg into the skin every 28 (twenty-eight) days., Disp: 1.12 mL, Rfl: 5   ipratropium (ATROVENT) 0.03 % nasal spray, Place 2 sprays into both nostrils every 12 (twelve) hours., Disp: 30 mL, Rfl: 0   loperamide (IMODIUM A-D) 2 MG tablet, Take 1 tablet (2 mg total) by mouth 4 (four) times daily as needed for diarrhea or loose stools., Disp: 30 tablet, Rfl: 0   meclizine (ANTIVERT) 25 MG tablet, Take 1 tablet (25 mg total) by mouth 3 (three) times daily as needed for dizziness., Disp: 30 tablet, Rfl: 1   propranolol (INDERAL) 40 MG tablet, TAKE 1/2 TABLET BY MOUTH DAILY, Disp: 45 tablet, Rfl: 1   Medications ordered in this encounter:  No orders of the defined types were placed in this encounter.    *If you need refills on other medications prior to your next appointment, please contact  your pharmacy*  Follow-Up: Call back or seek an in-person evaluation if the symptoms worsen or if the condition fails to improve as anticipated.  Other Instructions   If you have been instructed to have an in-person evaluation today at a local Urgent Care facility, please use the link below. It will take you to a list of all of our available [facility], including address, phone number and hours of operation. Please do not delay care.  [facility]  If you or a family member do not have a primary care provider, use the link below to schedule a visit and establish care. When you choose a [facility] primary care physician or advanced practice provider, you gain a long-term partner in health. Find a Primary Care Provider  Learn more about [facility]'s in-office and virtual care options: [facility]

## 2022-01-21 ENCOUNTER — Encounter: Payer: Self-pay | Admitting: Family Medicine

## 2022-01-23 ENCOUNTER — Telehealth: Payer: 59 | Admitting: Nurse Practitioner

## 2022-01-23 ENCOUNTER — Encounter: Payer: Self-pay | Admitting: Nurse Practitioner

## 2022-01-23 DIAGNOSIS — G43809 Other migraine, not intractable, without status migrainosus: Secondary | ICD-10-CM

## 2022-01-23 MED ORDER — SUMATRIPTAN SUCCINATE 50 MG PO TABS: 50.0000 mg | ORAL_TABLET | ORAL | 0 refills | Status: DC | PRN

## 2022-01-23 NOTE — Progress Notes (Signed)
Virtual Visit Consent   Paris Hohn, you are scheduled for a virtual visit with a [facility] provider today. Just as with appointments in the office, your consent must be obtained to participate. Your consent will be active for this visit and any virtual visit you may have with one of our providers in the next 365 days. If you have a MyChart account, a copy of this consent can be sent to you electronically.  As this is a virtual visit, video technology does not allow for your provider to perform a traditional examination. This may limit your provider's ability to fully assess your condition. If your provider identifies any concerns that need to be evaluated in person or the need to arrange testing (such as labs, EKG, etc.), we will make arrangements to do so. Although advances in technology are sophisticated, we cannot ensure that it will always work on either your end or our end. If the connection with a video visit is poor, the visit may have to be switched to a telephone visit. With either a video or telephone visit, we are not always able to ensure that we have a secure connection.  By engaging in this virtual visit, you consent to the provision of healthcare and authorize for your insurance to be billed (if applicable) for the services provided during this visit. Depending on your insurance coverage, you may receive a charge related to this service.  I need to obtain your verbal consent now. Are you willing to proceed with your visit today? Krystine Pabst has provided verbal consent on 01/23/2022 for a virtual visit (video or telephone). Gildardo Pounds, NP  Date: 01/23/2022 10:21 AM  Virtual Visit via Video Note   I, Gildardo Pounds, connected with  Coti Burd  ([id number], Oct 23, 1980) on 01/23/22 at  9:45 AM EDT by a video-enabled telemedicine application and verified that I am speaking with the correct person using two identifiers.  Location: Patient:  Virtual Visit Location Patient: Home Provider: Virtual Visit Location Provider: Home Office   I discussed the limitations of evaluation and management by telemedicine and the availability of in person appointments. The patient expressed understanding and agreed to proceed.    History of Present Illness: Maripat Borba is a 41 y.o. who identifies as a female who was assigned female at birth, and is being seen today for Migraine.  Notes breakthrough migraines despite taking Emgality monthly. Believes her headache pain was triggered by eating pizza a few days ago. States "Do y'all have something stronger to give me?" Associated symptoms include: nausea.    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 May repeat in 2 hours if headache persists  or recurs., Disp: 10 tablet, Rfl: 0   amLODipine-benazepril (LOTREL) 5-10 MG capsule, TAKE 1 CAPSULE BY MOUTH EVERY DAY, Disp: 90 capsule, Rfl: 2   cetirizine (ZYRTEC ALLERGY) 10 MG tablet, Take 1 tablet (10 mg total) by mouth daily., Disp: 90 tablet, Rfl: 1   fluticasone (FLONASE) 50 MCG/ACT nasal spray, Place 2 sprays into both nostrils  daily., Disp: 16 g, Rfl: 6   gabapentin (NEURONTIN) 100 MG capsule, Take 1 capsule (100 mg total) by mouth 3 (three) times daily., Disp: 90 capsule, Rfl: 0   gabapentin (NEURONTIN) 400 MG capsule, Take 1 capsule (400 mg total) by mouth 3 (three) times daily., Disp: 90 capsule, Rfl: 0   Galcanezumab-gnlm (EMGALITY) 120 MG/ML SOAJ, Inject 120 mg into the skin every 28 (twenty-eight) days., Disp: 1.12 mL, Rfl: 5   ipratropium (ATROVENT) 0.03 % nasal spray, Place 2 sprays into both nostrils every 12 (twelve) hours., Disp: 30 mL, Rfl: 0   loperamide (IMODIUM A-D) 2 MG tablet, Take 1 tablet (2 mg total) by mouth 4 (four) times daily as needed for diarrhea or loose stools., Disp: 30 tablet, Rfl: 0   meclizine (ANTIVERT) 25 MG tablet, Take 1 tablet (25 mg total) by mouth 3 (three) times daily as needed for dizziness., Disp: 30 tablet, Rfl: 1   propranolol (INDERAL) 40 MG tablet, TAKE 1/2 TABLET BY MOUTH DAILY, Disp: 45 tablet, Rfl: 1  Observations/Objective: Patient is well-developed, well-nourished in no acute distress.  Resting comfortably  at home.  Head is normocephalic, atraumatic.  No labored breathing.  Speech is clear and coherent with logical content.  Patient is alert and oriented at baseline.    Assessment and Plan: 1. Other migraine without status migrainosus, not intractable - SUMAtriptan (IMITREX) 50 MG tablet; Take 1 tablet (50 mg total) by mouth every 2 (two) hours as needed for migraine. May repeat in 2 hours if headache persists or recurs.  Dispense: 10 tablet; Refill: 0     Follow Up Instructions: I discussed the assessment and treatment plan with the patient. The patient was provided an opportunity to ask questions and all were answered. The patient agreed with the plan and demonstrated an understanding of the instructions.  A copy of instructions were sent to the patient via MyChart unless otherwise noted below.   The patient was advised to call back or seek an in-person  evaluation if the symptoms worsen or if the condition fails to improve as anticipated.  Time:  I spent 12 minutes with the patient via telehealth technology discussing the above problems/concerns.    Gildardo Pounds, NP

## 2022-01-28 ENCOUNTER — Other Ambulatory Visit (HOSPITAL_COMMUNITY): Payer: Self-pay | Admitting: Psychiatry

## 2022-01-28 ENCOUNTER — Telehealth (HOSPITAL_COMMUNITY): Payer: Self-pay

## 2022-01-28 DIAGNOSIS — F419 Anxiety disorder, unspecified: Secondary | ICD-10-CM

## 2022-01-28 DIAGNOSIS — F3181 Bipolar II disorder: Secondary | ICD-10-CM

## 2022-01-28 MED ORDER — GABAPENTIN 100 MG PO CAPS: 100.0000 mg | ORAL_CAPSULE | Freq: Three times a day (TID) | ORAL | 3 refills | Status: DC

## 2022-01-28 MED ORDER — GABAPENTIN 400 MG PO CAPS: 400.0000 mg | ORAL_CAPSULE | Freq: Three times a day (TID) | ORAL | 3 refills | Status: DC

## 2022-01-28 NOTE — Telephone Encounter (Signed)
Medication refill -Telephone call with patient, after she left a message she is in need of a new Gabapentin 100 mg order, last e-scribed 12/17/21 for a 30 day supply.  Patient stated she has enough of the 400 mg dosage to last until next appt on 02/02/22.  Patient reported she is now taking Gabapentin 100 and 400 mg dosages, for a total of 500 mg three times a day as was increased at last evaluation on 12/17/21.  Agreed to send provider that patient will be seeing on 02/02/22 the request for a new 100 mg dosage order and [facility] [address] [place]

## 2022-01-28 NOTE — Telephone Encounter (Signed)
Patient refilled and sent to preferred pharmacy.

## 2022-01-28 NOTE — Telephone Encounter (Signed)
Medication management - Telephone call with patient to inform Dr. Ronne Binning had sent in her requested new Gabapentin 100 mg order and that she also sent in her Gabapentin 400 mg dosage, both with 3 fills [facility] [address]. Reminded patient of upcoming appointment on 02/02/22 and patient stated plan to keep the appointment.

## 2022-02-02 ENCOUNTER — Encounter (HOSPITAL_COMMUNITY): Payer: Self-pay | Admitting: Psychiatry

## 2022-02-02 ENCOUNTER — Telehealth (INDEPENDENT_AMBULATORY_CARE_PROVIDER_SITE_OTHER): Payer: 59 | Admitting: Psychiatry

## 2022-02-02 DIAGNOSIS — F419 Anxiety disorder, unspecified: Secondary | ICD-10-CM | POA: Diagnosis not present

## 2022-02-02 DIAGNOSIS — R630 Anorexia: Secondary | ICD-10-CM

## 2022-02-02 DIAGNOSIS — F3181 Bipolar II disorder: Secondary | ICD-10-CM

## 2022-02-02 MED ORDER — GABAPENTIN 400 MG PO CAPS: 400.0000 mg | ORAL_CAPSULE | Freq: Three times a day (TID) | ORAL | 3 refills | Status: DC

## 2022-02-02 MED ORDER — GABAPENTIN 100 MG PO CAPS: 100.0000 mg | ORAL_CAPSULE | Freq: Three times a day (TID) | ORAL | 3 refills | Status: DC

## 2022-02-07 ENCOUNTER — Telehealth: Payer: 59 | Admitting: Family

## 2022-02-07 DIAGNOSIS — R42 Dizziness and giddiness: Secondary | ICD-10-CM

## 2022-02-07 DIAGNOSIS — I10 Essential (primary) hypertension: Secondary | ICD-10-CM | POA: Diagnosis not present

## 2022-02-07 NOTE — Progress Notes (Signed)
Virtual Visit Consent   Sue Jackson, you are scheduled for a virtual visit with a [facility] provider today. Just as with appointments in the office, your consent must be obtained to participate. Your consent will be active for this visit and any virtual visit you may have with one of our providers in the next 365 days. If you have a MyChart account, a copy of this consent can be sent to you electronically.  As this is a virtual visit, video technology does not allow for your provider to perform a traditional examination. This may limit your provider's ability to fully assess your condition. If your provider identifies any concerns that need to be evaluated in person or the need to arrange testing (such as labs, EKG, etc.), we will make arrangements to do so. Although advances in technology are sophisticated, we cannot ensure that it will always work on either your end or our end. If the connection with a video visit is poor, the visit may have to be switched to a telephone visit. With either a video or telephone visit, we are not always able to ensure that we have a secure connection.  By engaging in this virtual visit, you consent to the provision of healthcare and authorize for your insurance to be billed (if applicable) for the services provided during this visit. Depending on your insurance coverage, you may receive a charge related to this service.  I need to obtain your verbal consent now. Are you willing to proceed with your visit today? Sue Jackson has provided verbal consent on 02/07/2022 for a virtual visit (video or telephone). Sue Dun, FNP  Date: 02/07/2022 11:58 AM  Virtual Visit via Video Note   I, Sue Jackson, connected with  Sue Jackson  ([id number], [birth year]) on 02/07/22 at 12:00 PM EDT by a video-enabled telemedicine application and verified that I am speaking with the correct person using two identifiers.  Location: Patient: Virtual  Visit Location Patient: Home Provider: Virtual Visit Location Provider: Home Office   I discussed the limitations of evaluation and management by telemedicine and the availability of in person appointments. The patient expressed understanding and agreed to proceed.    History of Present Illness: Sue Jackson is a 41 y.o. who identifies as a female who was assigned female at birth, and is being seen today for elevated blood pressure and vertigo.  She reports yesterday at work, the [facility] broke and it was very hot. She felt overheated and that triggered her vertigo. She reports she is requesting a work note.   HPI: Hypertension This is a chronic problem. The current episode started more than 1 year ago. The problem has been resolved since onset. The problem is controlled. Pertinent negatives include no malaise/fatigue, peripheral edema or shortness of breath. Risk factors for coronary artery disease include dyslipidemia. Past treatments include ACE inhibitors and calcium channel blockers. The current treatment provides moderate improvement. There is no history of heart failure.  Dizziness This is a chronic problem. The current episode started more than 1 year ago. The problem occurs intermittently. The problem has been waxing and waning. Associated symptoms include nausea and vomiting. The symptoms are aggravated by twisting. She has tried rest (antivert) for the symptoms.    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   Bipolar 2 disorder ([place]) 11/16/2019   Prediabetes 08/15/2018   Anxiety 02/06/2018   Low back pain radiating to right lower extremity 07/06/2017   Upper back pain, chronic 07/06/2017   Hypertension,  essential, benign 03/11/2017   Bipolar disorder with depression ([place]) 03/11/2017   Severe obesity (BMI 35.0-35.9 with comorbidity) ([facility]) 03/11/2017    Allergies: No Known Allergies Medications:  Current Outpatient Medications:    amLODipine-benazepril (LOTREL) 5-10 MG capsule, TAKE 1 CAPSULE BY MOUTH EVERY DAY, Disp: 90 capsule, Rfl: 2   cetirizine (ZYRTEC ALLERGY) 10 MG tablet, Take 1 tablet (10 mg total) by mouth daily., Disp: 90 tablet, Rfl: 1   fluticasone (FLONASE) 50 MCG/ACT nasal spray, Place 2 sprays into both nostrils daily., Disp: 16 g, Rfl: 6   gabapentin (NEURONTIN) 100 MG capsule, Take 1 capsule (100 mg total) by mouth 3 (three) times daily., Disp: 90 capsule, Rfl: 3   gabapentin (NEURONTIN) 400 MG capsule, Take 1 capsule (400 mg total) by mouth 3 (three) times daily., Disp: 90 capsule, Rfl: 3   Galcanezumab-gnlm (EMGALITY) 120 MG/ML SOAJ, Inject 120 mg into the skin every 28 (twenty-eight) days., Disp: 1.12 mL, Rfl: 5   ipratropium (ATROVENT) 0.03 % nasal spray, Place 2 sprays into both nostrils every 12 (twelve) hours., Disp: 30 mL, Rfl: 0   loperamide (IMODIUM A-D) 2 MG tablet, Take 1 tablet (2 mg total) by mouth 4 (four) times daily as needed for diarrhea or loose stools., Disp: 30 tablet, Rfl: 0   meclizine (ANTIVERT) 25 MG tablet, Take 1 tablet (25 mg total) by mouth 3 (three) times daily as needed for dizziness., Disp: 30 tablet, Rfl: 1   propranolol (INDERAL) 40 MG tablet, TAKE 1/2 TABLET BY MOUTH DAILY, Disp: 45 tablet, Rfl: 1   SUMAtriptan (IMITREX) 50 MG tablet, Take 1 tablet (50 mg total) by mouth every 2 (two) hours as needed for migraine. May repeat in 2 hours if headache persists or recurs., Disp: 10 tablet, Rfl: 0  Observations/Objective: Patient is well-developed, well-nourished in no acute distress.  Resting comfortably  at home.  Head is normocephalic, atraumatic.  No labored breathing.  Speech is clear and coherent with logical content.  Patient is alert and  oriented at baseline.    Assessment and Plan: 1. Primary hypertension  2. Vertigo  Continue medications Force fluids Rest Antivert as needed Fall precautions  Work noted given   Follow Up Instructions: I discussed the assessment and treatment plan with the patient. The patient was provided an opportunity to ask questions and all were answered. The patient agreed with the plan and demonstrated an understanding of the instructions.  A copy of instructions were sent to the patient via MyChart unless otherwise noted below.     The patient was advised to call back or seek an in-person evaluation if the symptoms worsen or if the condition fails to improve as anticipated.  Time:  I spent 7 minutes with the patient via telehealth technology discussing the above problems/concerns.    Sue Dun, FNP

## 2022-02-08 ENCOUNTER — Ambulatory Visit (HOSPITAL_COMMUNITY): Payer: 59 | Admitting: Clinical

## 2022-02-08 ENCOUNTER — Telehealth (HOSPITAL_COMMUNITY): Payer: Self-pay | Admitting: Clinical

## 2022-02-08 NOTE — Telephone Encounter (Signed)
Therapist was able to make contact with the client via telephone. Client reported she had difficulty connecting via video. Client reported she forgot about the appt and is in [place]. Client reported she would like to reschedule the appt. Therapist informed client admin staff will contact her to reschedule.

## 2022-03-04 DIAGNOSIS — Z0289 Encounter for other administrative examinations: Secondary | ICD-10-CM

## 2022-03-05 ENCOUNTER — Telehealth: Payer: 59 | Admitting: Physician Assistant

## 2022-03-05 DIAGNOSIS — G43109 Migraine with aura, not intractable, without status migrainosus: Secondary | ICD-10-CM

## 2022-03-05 MED ORDER — MECLIZINE HCL 25 MG PO TABS: 25.0000 mg | ORAL_TABLET | Freq: Three times a day (TID) | ORAL | 1 refills | Status: DC | PRN

## 2022-03-05 NOTE — Progress Notes (Signed)
Virtual Visit Consent   Sue Jackson, you are scheduled for a virtual visit with a [facility] provider today. Just as with appointments in the office, your consent must be obtained to participate. Your consent will be active for this visit and any virtual visit you may have with one of our providers in the next 365 days. If you have a MyChart account, a copy of this consent can be sent to you electronically.  As this is a virtual visit, video technology does not allow for your provider to perform a traditional examination. This may limit your provider's ability to fully assess your condition. If your provider identifies any concerns that need to be evaluated in person or the need to arrange testing (such as labs, EKG, etc.), we will make arrangements to do so. Although advances in technology are sophisticated, we cannot ensure that it will always work on either your end or our end. If the connection with a video visit is poor, the visit may have to be switched to a telephone visit. With either a video or telephone visit, we are not always able to ensure that we have a secure connection.  By engaging in this virtual visit, you consent to the provision of healthcare and authorize for your insurance to be billed (if applicable) for the services provided during this visit. Depending on your insurance coverage, you may receive a charge related to this service.  I need to obtain your verbal consent now. Are you willing to proceed with your visit today? Sue Jackson has provided verbal consent on 03/05/2022 for a virtual visit (video or telephone). Mar Daring, PA-C  Date: 03/05/2022 4:22 PM  Virtual Visit via Video Note   I, Mar Daring, connected with  Sue Jackson  ([id number], 1980/11/02) on 03/05/22 at  4:15 PM EDT by a video-enabled telemedicine application and verified that I am speaking with the correct person using two  identifiers.  Location: Patient: Virtual Visit Location Patient: Home Provider: Virtual Visit Location Provider: Home Office   I discussed the limitations of evaluation and management by telemedicine and the availability of in person appointments. The patient expressed understanding and agreed to proceed.    History of Present Illness: Sue Jackson is a 41 y.o. who identifies as a female who was assigned female at birth, and is being seen today for recurrent migraines.  HPI: Migraine  This is a recurrent problem. Episode onset: history of vestibular migraines and having a flare today from heat and sun. The problem occurs constantly. The problem has been gradually worsening. Pain location: vertigo, not pain. The pain does not radiate. The pain quality is similar to prior headaches. The patient is experiencing no pain.      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   Bipolar 2 disorder ([place]) 11/16/2019   Prediabetes 08/15/2018   Anxiety 02/06/2018   Low back pain radiating to right lower extremity 07/06/2017   Upper back pain, chronic 07/06/2017   Hypertension, essential, benign 03/11/2017   Bipolar disorder with depression ([place]) 03/11/2017   Severe obesity (BMI 35.0-35.9 with comorbidity) ([facility]) 03/11/2017    Allergies: No Known Allergies Medications:  Current Outpatient Medications:    amLODipine-benazepril (LOTREL)  5-10 MG capsule, TAKE 1 CAPSULE BY MOUTH EVERY DAY, Disp: 90 capsule, Rfl: 2   cetirizine (ZYRTEC ALLERGY) 10 MG tablet, Take 1 tablet (10 mg total) by mouth daily., Disp: 90 tablet, Rfl: 1   fluticasone (FLONASE) 50 MCG/ACT nasal spray, Place 2 sprays into both nostrils daily., Disp: 16 g,  Rfl: 6   gabapentin (NEURONTIN) 100 MG capsule, Take 1 capsule (100 mg total) by mouth 3 (three) times daily., Disp: 90 capsule, Rfl: 3   gabapentin (NEURONTIN) 400 MG capsule, Take 1 capsule (400 mg total) by mouth 3 (three) times daily., Disp: 90 capsule, Rfl: 3   Galcanezumab-gnlm (EMGALITY) 120 MG/ML SOAJ, Inject 120 mg into the skin every 28 (twenty-eight) days., Disp: 1.12 mL, Rfl: 5   ipratropium (ATROVENT) 0.03 % nasal spray, Place 2 sprays into both nostrils every 12 (twelve) hours., Disp: 30 mL, Rfl: 0   loperamide (IMODIUM A-D) 2 MG tablet, Take 1 tablet (2 mg total) by mouth 4 (four) times daily as needed for diarrhea or loose stools., Disp: 30 tablet, Rfl: 0   meclizine (ANTIVERT) 25 MG tablet, Take 1 tablet (25 mg total) by mouth 3 (three) times daily as needed for dizziness., Disp: 30 tablet, Rfl: 1   propranolol (INDERAL) 40 MG tablet, TAKE 1/2 TABLET BY MOUTH DAILY, Disp: 45 tablet, Rfl: 1   SUMAtriptan (IMITREX) 50 MG tablet, Take 1 tablet (50 mg total) by mouth every 2 (two) hours as needed for migraine. May repeat in 2 hours if headache persists or recurs., Disp: 10 tablet, Rfl: 0  Observations/Objective: Patient is well-developed, well-nourished in no acute distress.  Resting comfortably at home.  Head is normocephalic, atraumatic.  No labored breathing.  Speech is clear and coherent with logical content.  Patient is alert and oriented at baseline.    Assessment and Plan: 1. Vertiginous migraine - meclizine (ANTIVERT) 25 MG tablet; Take 1 tablet (25 mg total) by mouth 3 (three) times daily as needed for dizziness.  Dispense: 30 tablet; Refill: 1  - Meclizine refilled for vertiginous migraine symptom - Push fluids - Rest - Work note provided  Follow Up Instructions: I discussed the assessment and treatment plan with the patient. The patient was provided an opportunity to ask questions and all were answered. The patient agreed with the plan and demonstrated an  understanding of the instructions.  A copy of instructions were sent to the patient via MyChart unless otherwise noted below.    The patient was advised to call back or seek an in-person evaluation if the symptoms worsen or if the condition fails to improve as anticipated.  Time:  I spent 10 minutes with the patient via telehealth technology discussing the above problems/concerns.    Mar Daring, PA-C

## 2022-03-05 NOTE — Patient Instructions (Signed)
Sue Jackson, thank you for joining Mar Daring, PA-C for today's virtual visit.  While this provider is not your primary care provider (PCP), if your PCP is located in our provider database this encounter information will be shared with them immediately following your visit.  Consent: (Patient) Sue Jackson provided verbal consent for this virtual visit at the beginning of the encounter.  Current Medications:  Current Outpatient Medications:    amLODipine-benazepril (LOTREL) 5-10 MG capsule, TAKE 1 CAPSULE BY MOUTH EVERY DAY, Disp: 90 capsule, Rfl: 2   cetirizine (ZYRTEC ALLERGY) 10 MG tablet, Take 1 tablet (10 mg total) by mouth daily., Disp: 90 tablet, Rfl: 1   fluticasone (FLONASE) 50 MCG/ACT nasal spray, Place 2 sprays into both nostrils daily., Disp: 16 g, Rfl: 6   gabapentin (NEURONTIN) 100 MG capsule, Take 1 capsule (100 mg total) by mouth 3 (three) times daily., Disp: 90 capsule, Rfl: 3   gabapentin (NEURONTIN) 400 MG capsule, Take 1 capsule (400 mg total) by mouth 3 (three) times daily., Disp: 90 capsule, Rfl: 3   Galcanezumab-gnlm (EMGALITY) 120 MG/ML SOAJ, Inject 120 mg into the skin every 28 (twenty-eight) days., Disp: 1.12 mL, Rfl: 5   ipratropium (ATROVENT) 0.03 % nasal spray, Place 2 sprays into both nostrils every 12 (twelve) hours., Disp: 30 mL, Rfl: 0   loperamide (IMODIUM A-D) 2 MG tablet, Take 1 tablet (2 mg total) by mouth 4 (four) times daily as needed for diarrhea or loose stools., Disp: 30 tablet, Rfl: 0   meclizine (ANTIVERT) 25 MG tablet, Take 1 tablet (25 mg total) by mouth 3 (three) times daily as needed for dizziness., Disp: 30 tablet, Rfl: 1   propranolol (INDERAL) 40 MG tablet, TAKE 1/2 TABLET BY MOUTH DAILY, Disp: 45 tablet, Rfl: 1   SUMAtriptan (IMITREX) 50 MG tablet, Take 1 tablet (50 mg total) by mouth every 2 (two) hours as needed for migraine. May repeat in 2 hours if headache persists or recurs., Disp: 10 tablet, Rfl: 0    Medications ordered in this encounter:  Meds ordered this encounter  Medications   meclizine (ANTIVERT) 25 MG tablet    Sig: Take 1 tablet (25 mg total) by mouth 3 (three) times daily as needed for dizziness.    Dispense:  30 tablet    Refill:  1    Order Specific Question:   Supervising Provider    Answer:   Sabra Heck, BRIAN [[id number]]     *If you need refills on other medications prior to your next appointment, please contact your pharmacy*  Follow-Up: Call back or seek an in-person evaluation if the symptoms worsen or if the condition fails to improve as anticipated.  Other Instructions Vertigo Vertigo is the feeling that you or the things around you are moving when they are not. This feeling can come and go at any time. Vertigo often goes away on its own. This condition can be dangerous if it happens when you are doing activities like driving or working with machines. Your doctor will do tests to find the cause of your vertigo. These tests will also help your doctor decide on the best treatment for you. Follow these instructions at home: Eating and drinking     Drink enough fluid to keep your pee (urine) pale yellow. Do not drink alcohol. Activity Return to your normal activities when your doctor says that it is safe. In the morning, first sit up on the side of the bed. When you feel okay, stand slowly  while you hold onto something until you know that your balance is fine. Move slowly. Avoid sudden body or head movements or certain positions, as told by your doctor. Use a cane if you have trouble standing or walking. Sit down right away if you feel dizzy. Avoid doing any tasks or activities that can cause danger to you or others if you get dizzy. Avoid bending down if you feel dizzy. Place items in your home so that they are easy for you to reach without bending or leaning over. Do not drive or use machinery if you feel dizzy. General instructions Take over-the-counter and  prescription medicines only as told by your doctor. Keep all follow-up visits. Contact a doctor if: Your medicine does not help your vertigo. Your problems get worse or you have new symptoms. You have a fever. You feel like you may vomit (nauseous), or this feeling gets worse. You start to vomit. Your family or friends see changes in how you act. You lose feeling (have numbness) in part of your body. You feel prickling and tingling in a part of your body. Get help right away if: You are always dizzy. You faint. You get very bad headaches. You get a stiff neck. Bright light starts to bother you. You have trouble moving or talking. You feel weak in your hands, arms, or legs. You have changes in your hearing or in how you see (vision). These symptoms may be an emergency. Get help right away. Call your local emergency services (911 in the [place] Do not wait to see if the symptoms will go away. Do not drive yourself to the hospital. Summary Vertigo is the feeling that you or the things around you are moving when they are not. Your doctor will do tests to find the cause of your vertigo. You may be told to avoid some tasks, positions, or movements. Contact a doctor if your medicine is not helping, or if you have a fever, new symptoms, or a change in how you act. Get help right away if you get very bad headaches, or if you have changes in how you speak, hear, or see. This information is not intended to replace advice given to you by your health care provider. Make sure you discuss any questions you have with your health care provider. Document Revised: 06/25/2020 Document Reviewed: 06/25/2020 Elsevier Patient Education  [place].    If you have been instructed to have an in-person evaluation today at a local Urgent Care facility, please use the link below. It will take you to a list of all of our available [facility], including address, phone number and hours of  operation. Please do not delay care.  [facility]  If you or a family member do not have a primary care provider, use the link below to schedule a visit and establish care. When you choose a [facility] primary care physician or advanced practice provider, you gain a long-term partner in health. Find a Primary Care Provider  Learn more about [facility] in-office and virtual care options: [facility]

## 2022-03-09 ENCOUNTER — Ambulatory Visit (HOSPITAL_COMMUNITY): Payer: 59 | Admitting: Clinical

## 2022-03-16 NOTE — Progress Notes (Signed)
ACUTE VISIT Chief Complaint  Patient presents with   Menstrual Problem    Having continual spotting before & after period. Mom started menopause around 49.    HPI: [name] is a 41 y.o. female with hx of HTN,prediabetes,bipolar disorder,migraine,anxiety,and right ductal carcinoma in situ here today complaining of intermittent vaginal spotting as described above since beginning of 02/2022. Dark brown blood for 6-7 days before her menstrual period. No hx of trauma.  Vaginal Bleeding The patient's primary symptoms include vaginal bleeding. The patient's pertinent negatives include no genital itching, genital lesions, genital odor, genital rash, missed menses, pelvic pain or vaginal discharge. This is a new problem. The current episode started more than 1 month ago. The problem has been waxing and waning. The patient is experiencing no pain. She is not pregnant. Pertinent negatives include no abdominal pain, back pain, chills, diarrhea, discolored urine, dysuria, fever, flank pain, frequency, headaches or hematuria. The vaginal bleeding is spotting. She has not been passing clots. She has not been passing tissue. Nothing aggravates the symptoms. She has tried nothing for the symptoms. She is sexually active. No, her partner does not have an STD.  Hot flashes and sweating, intermittent through the day. She has not identified exacerbating or alleviating factors. LMP 02/17/22.  For years RUQ mild abdominal pain, not radiated, sometimes with associated nausea. Bloating sensation. It happens with and w/o food intake. No changes in bowel habits, jaundice,or vomiting.  Hypertension:  Medications:Amlodipine-Benazepril 5-10 mg daily and takes Propranolol 40 mg 1/2 tab daily as needed (also for anxiety). Negative for unusual or severe headache, visual changes, exertional chest pain, dyspnea,  focal weakness, or edema.  Ref Range & Units 7 mo ago Comments  Na 136 - 146 mmol/L 135  Low     Potassium 3.7 - 5.4 mmol/L 3.7    Cl 97 - 108 mmol/L 101    CO2 20 - 32 mmol/L 23    AGAP 7 - 16 mmol/L 11    Glucose 65 - 99 mg/dL 115 High     BUN 6 - 24 mg/dL 18    Creatinine 0.57 - 1.00 mg/dL 0.86    Ca 8.7 - 10.2 mg/dL 9.0    ALK PHOS 25 - 150 U/L 100    T Bili 0.00 - 1.20 mg/dL 0.20    Total Protein 6.0 - 8.5 gm/dL 7.2    Alb 3.5 - 5.5 gm/dL 3.8    GLOBULIN 1.5 - 4.5 gm/dL 3.4    ALBUMIN/GLOBULIN RATIO 1.1 - 2.5 1.1    BUN/CREAT RATIO 11.0 - 26.0 20.9    ALT 0 - 40 U/L 30    AST 0 - 40 U/L 17    eGFR mL/min/1.22m 87    She has appt tomorrow [facility] and she is having fasting labs.  Prediabetes: Last HgA1C was 6.4 in 08/2021. Negative for polydipsia,polyuria, or polyphagia. She has not been consistent with following a healthful diet and not exercising regularly.  Review of Systems  Constitutional:  Negative for chills and fever.  Respiratory:  Negative for cough and wheezing.   Gastrointestinal:  Negative for abdominal pain and diarrhea.  Endocrine: Negative for cold intolerance and heat intolerance.  Genitourinary:  Positive for vaginal bleeding. Negative for dysuria, flank pain, frequency, hematuria, missed menses, pelvic pain and vaginal discharge.  Musculoskeletal:  Negative for back pain.  Neurological:  Negative for syncope, weakness and headaches.  Rest see pertinent positives and negatives per HPI.  Current  Outpatient Medications on File Prior to Visit  Medication Sig Dispense Refill   amLODipine-benazepril (LOTREL) 5-10 MG capsule TAKE 1 CAPSULE BY MOUTH EVERY DAY 90 capsule 2   cetirizine (ZYRTEC ALLERGY) 10 MG tablet Take 1 tablet (10 mg total) by mouth daily. 90 tablet 1   fluticasone (FLONASE) 50 MCG/ACT nasal spray Place 2 sprays into both nostrils daily. 16 g 6   gabapentin (NEURONTIN) 100 MG capsule Take 1 capsule (100 mg total) by mouth 3 (three) times daily. 90 capsule 3   gabapentin (NEURONTIN) 400 MG capsule Take 1 capsule (400 mg  total) by mouth 3 (three) times daily. 90 capsule 3   Galcanezumab-gnlm (EMGALITY) 120 MG/ML SOAJ Inject 120 mg into the skin every 28 (twenty-eight) days. 1.12 mL 5   ipratropium (ATROVENT) 0.03 % nasal spray Place 2 sprays into both nostrils every 12 (twelve) hours. 30 mL 0   loperamide (IMODIUM A-D) 2 MG tablet Take 1 tablet (2 mg total) by mouth 4 (four) times daily as needed for diarrhea or loose stools. 30 tablet 0   meclizine (ANTIVERT) 25 MG tablet Take 1 tablet (25 mg total) by mouth 3 (three) times daily as needed for dizziness. 30 tablet 1   propranolol (INDERAL) 40 MG tablet TAKE 1/2 TABLET BY MOUTH DAILY 45 tablet 1   SUMAtriptan (IMITREX) 50 MG tablet Take 1 tablet (50 mg total) by mouth every 2 (two) hours as needed for migraine. May repeat in 2 hours if headache persists or recurs. 10 tablet 0   No current facility-administered medications on file prior to visit.   Past Medical History:  Diagnosis Date   Anemia 2019-2020   Anxiety    Arthritis    Bipolar disorder ([place]) 2013   Cancer ([place]) 2021   Depression    Family history of breast cancer    Headache 2021   Hypertension    Personal history of radiation therapy    Pre-diabetes    No Known Allergies  Social History   Socioeconomic History   Marital status: Married    Spouse name: Not on file   Number of children: 0   Years of education: Not on file   Highest education level: Not on file  Occupational History   Not on file  Tobacco Use   Smoking status: Former   Smokeless tobacco: Never   Tobacco comments:    social smoker  Vaping Use   Vaping Use: Never used  Substance and Sexual Activity   Alcohol use: Not Currently    Comment: occasionally   Drug use: Yes    Types: Marijuana   Sexual activity: Yes    Birth control/protection: None  Other Topics Concern   Not on file  Social History Narrative   Right handed   Social Determinants of Health   Financial Resource Strain: Low Risk  (04/16/2020)    Overall Financial Resource Strain (CARDIA)    Difficulty of Paying Living Expenses: Not very hard  Food Insecurity: No Food Insecurity (04/16/2020)   Hunger Vital Sign    Worried About Running Out of Food in the Last Year: Never true    [facility] in the Last Year: Never true  Transportation Needs: No Transportation Needs (04/16/2020)   PRAPARE - Hydrologist (Medical): No    Lack of Transportation (Non-Medical): No  Physical Activity: Not on file  Stress: Not on file  Social Connections: Not on file   Vitals:   03/17/22  1447  BP: 120/70  Pulse: 65  Resp: 12  SpO2: 98%   Body mass index is 36.41 kg/m.  Physical Exam Vitals and nursing note reviewed.  Constitutional:      General: She is not in acute distress.    Appearance: She is well-developed.  HENT:     Head: Normocephalic and atraumatic.     Mouth/Throat:     Mouth: Mucous membranes are moist.     Pharynx: Oropharynx is clear.  Eyes:     Conjunctiva/sclera: Conjunctivae normal.  Cardiovascular:     Rate and Rhythm: Normal rate and regular rhythm.     Pulses:          Dorsalis pedis pulses are 2+ on the right side and 2+ on the left side.     Heart sounds: No murmur heard. Pulmonary:     Effort: Pulmonary effort is normal. No respiratory distress.     Breath sounds: Normal breath sounds.  Abdominal:     Palpations: Abdomen is soft. There is no hepatomegaly or mass.     Tenderness: There is no abdominal tenderness.  Genitourinary:    Comments: Deferred to gyn. Lymphadenopathy:     Cervical: No cervical adenopathy.  Skin:    General: Skin is warm.     Findings: No erythema or rash.  Neurological:     General: No focal deficit present.     Mental Status: She is alert and oriented to person, place, and time.     Cranial Nerves: No cranial nerve deficit.     Gait: Gait normal.  Psychiatric:     Comments: Well groomed, good eye contact.   ASSESSMENT AND PLAN:  [name] was  seen today for menstrual problem.  Diagnoses and all orders for this visit: Orders Placed This Encounter  Procedures   Ambulatory referral to Gynecology   DUB (dysfunctional uterine bleeding) We discussed possible etiologies, could certainly be perimenopausal. No risk factors for STD's. In situ ductal carcinoma right breast, s/p right breast lumpectomy and adjuvant radiation. She is not on Tamoxifene, declined. Given her age she may need pelvic/transvaginal US and endometrial Bx. Appt with gyn will be arranged. Instructed about warning signs.  RUQ pain Chronic and mild. Gall bladder disease to be considered. She is not interested in further work up for now.  Primary hypertension BP adequately controlled. Continue same dose of Propranolol and Amlodipine-benazepril. Low salt diet recommended. She is having labs tomorrow.  Prediabetes She is very motivated to change her dietary habits, she has an appt tomorrow at [facility]. Consistency with following a healthful diet and regular physical activity recommended for diabetes prevention.  She is having labs tomorrow.  Return in about 6 months (around 09/17/2022) for cpe.  Curtiss Mahmood G. [place], MD  [facility]. [place] office.

## 2022-03-17 ENCOUNTER — Encounter: Payer: Self-pay | Admitting: Family Medicine

## 2022-03-17 ENCOUNTER — Ambulatory Visit: Payer: 59 | Admitting: Family Medicine

## 2022-03-17 VITALS — BP 120/70 | HR 65 | Resp 12 | Ht 67.0 in | Wt 232.5 lb

## 2022-03-17 DIAGNOSIS — N938 Other specified abnormal uterine and vaginal bleeding: Secondary | ICD-10-CM

## 2022-03-17 DIAGNOSIS — R7303 Prediabetes: Secondary | ICD-10-CM | POA: Diagnosis not present

## 2022-03-17 DIAGNOSIS — I10 Essential (primary) hypertension: Secondary | ICD-10-CM

## 2022-03-17 DIAGNOSIS — R1011 Right upper quadrant pain: Secondary | ICD-10-CM

## 2022-03-17 NOTE — Patient Instructions (Signed)
A few things to remember from today's visit:  DUB (dysfunctional uterine bleeding) - Plan: Ambulatory referral to Gynecology  RUQ pain  Primary hypertension  If you need refills please call your pharmacy. Do not use My Chart to request refills or for acute issues that need immediate attention.   Please be sure medication list is accurate. If a new problem present, please set up appointment sooner than planned today.   Tomorrow you are having labs done,so we held on labs today. Appt with gyn will be arranged, may need ultrasound among some.  Abdominal pain could be gall bladder, for now because it is mild, no imaging.  Blood pressure is good, so no changes.

## 2022-03-18 ENCOUNTER — Encounter (INDEPENDENT_AMBULATORY_CARE_PROVIDER_SITE_OTHER): Payer: Self-pay | Admitting: Family Medicine

## 2022-03-18 ENCOUNTER — Ambulatory Visit (INDEPENDENT_AMBULATORY_CARE_PROVIDER_SITE_OTHER): Payer: 59 | Admitting: Family Medicine

## 2022-03-18 VITALS — BP 125/73 | HR 65 | Temp 97.8°F | Ht 67.0 in | Wt 229.0 lb

## 2022-03-18 DIAGNOSIS — R0602 Shortness of breath: Secondary | ICD-10-CM | POA: Diagnosis not present

## 2022-03-18 DIAGNOSIS — E669 Obesity, unspecified: Secondary | ICD-10-CM | POA: Diagnosis not present

## 2022-03-18 DIAGNOSIS — Z1331 Encounter for screening for depression: Secondary | ICD-10-CM

## 2022-03-18 DIAGNOSIS — R5383 Other fatigue: Secondary | ICD-10-CM | POA: Diagnosis not present

## 2022-03-18 DIAGNOSIS — F3181 Bipolar II disorder: Secondary | ICD-10-CM

## 2022-03-18 DIAGNOSIS — Z8669 Personal history of other diseases of the nervous system and sense organs: Secondary | ICD-10-CM

## 2022-03-18 DIAGNOSIS — R7303 Prediabetes: Secondary | ICD-10-CM | POA: Diagnosis not present

## 2022-03-18 DIAGNOSIS — Z6835 Body mass index (BMI) 35.0-35.9, adult: Secondary | ICD-10-CM

## 2022-03-18 DIAGNOSIS — I10 Essential (primary) hypertension: Secondary | ICD-10-CM | POA: Diagnosis not present

## 2022-03-24 ENCOUNTER — Encounter: Payer: Self-pay | Admitting: Family Medicine

## 2022-03-25 ENCOUNTER — Encounter: Payer: Self-pay | Admitting: Radiology

## 2022-03-25 ENCOUNTER — Ambulatory Visit (INDEPENDENT_AMBULATORY_CARE_PROVIDER_SITE_OTHER): Payer: 59 | Admitting: Radiology

## 2022-03-25 VITALS — BP 120/70 | Ht 66.5 in | Wt 233.0 lb

## 2022-03-25 DIAGNOSIS — N926 Irregular menstruation, unspecified: Secondary | ICD-10-CM | POA: Diagnosis not present

## 2022-03-25 DIAGNOSIS — Z01419 Encounter for gynecological examination (general) (routine) without abnormal findings: Secondary | ICD-10-CM

## 2022-03-25 DIAGNOSIS — N852 Hypertrophy of uterus: Secondary | ICD-10-CM | POA: Diagnosis not present

## 2022-03-25 LAB — INSULIN, FREE AND TOTAL
Free Insulin: 16 uU/mL
Total Insulin: 16 uU/mL

## 2022-03-25 LAB — COMPREHENSIVE METABOLIC PANEL
ALT: 22 IU/L (ref 0–32)
AST: 5 IU/L (ref 0–40)
Albumin/Globulin Ratio: 1.5 (ref 1.2–2.2)
Albumin: 4.5 g/dL (ref 3.9–4.9)
Alkaline Phosphatase: 108 IU/L (ref 44–121)
BUN/Creatinine Ratio: 25 — ABNORMAL HIGH (ref 9–23)
BUN: 21 mg/dL (ref 6–24)
Bilirubin Total: 0.4 mg/dL (ref 0.0–1.2)
CO2: 22 mmol/L (ref 20–29)
Calcium: 9.2 mg/dL (ref 8.7–10.2)
Chloride: 99 mmol/L (ref 96–106)
Creatinine, Ser: 0.83 mg/dL (ref 0.57–1.00)
Globulin, Total: 3.1 g/dL (ref 1.5–4.5)
Glucose: 104 mg/dL — ABNORMAL HIGH (ref 70–99)
Potassium: 4.5 mmol/L (ref 3.5–5.2)
Sodium: 140 mmol/L (ref 134–144)
Total Protein: 7.6 g/dL (ref 6.0–8.5)
eGFR: 90 mL/min/{1.73_m2} (ref >59)

## 2022-03-25 LAB — CBC
Hematocrit: 46.3 % (ref 34.0–46.6)
Hemoglobin: 14.4 g/dL (ref 11.1–15.9)
MCH: 25.8 pg — ABNORMAL LOW (ref 26.6–33.0)
MCHC: 31.1 g/dL — ABNORMAL LOW (ref 31.5–35.7)
MCV: 83 fL (ref 79–97)
Platelets: 325 10*3/uL (ref 150–450)
RBC: 5.59 x10E6/uL — ABNORMAL HIGH (ref 3.77–5.28)
RDW: 16.2 % — ABNORMAL HIGH (ref 11.7–15.4)
WBC: 8.4 10*3/uL (ref 3.4–10.8)

## 2022-03-25 LAB — VITAMIN D 25 HYDROXY (VIT D DEFICIENCY, FRACTURES): Vit D, 25-Hydroxy: 30.8 ng/mL (ref 30.0–100.0)

## 2022-03-25 LAB — LIPID PANEL
Chol/HDL Ratio: 3 ratio (ref 0.0–4.4)
Cholesterol, Total: 154 mg/dL (ref 100–199)
HDL: 52 mg/dL (ref >39)
LDL Chol Calc (NIH): 89 mg/dL (ref 0–99)
Triglycerides: 65 mg/dL (ref 0–149)
VLDL Cholesterol Cal: 13 mg/dL (ref 5–40)

## 2022-03-25 LAB — IRON AND TIBC
Iron Saturation: 11 % — ABNORMAL LOW (ref 15–55)
Iron: 45 ug/dL (ref 27–159)
Total Iron Binding Capacity: 396 ug/dL (ref 250–450)
UIBC: 351 ug/dL (ref 131–425)

## 2022-03-25 LAB — TSH+FREE T4
Free T4: 1.61 ng/dL (ref 0.82–1.77)
TSH: 1.81 u[IU]/mL (ref 0.450–4.500)

## 2022-03-25 LAB — HEMOGLOBIN A1C
Est. average glucose Bld gHb Est-mCnc: 137 mg/dL
Hgb A1c MFr Bld: 6.4 % — ABNORMAL HIGH (ref 4.8–5.6)

## 2022-03-25 LAB — PREGNANCY, URINE: Preg Test, Ur: NEGATIVE

## 2022-03-25 LAB — FOLATE: Folate: 12.7 ng/mL (ref >3.0)

## 2022-03-25 LAB — FERRITIN: Ferritin: 29 ng/mL (ref 15–150)

## 2022-03-25 LAB — VITAMIN B12: Vitamin B-12: 766 pg/mL (ref 232–1245)

## 2022-03-25 NOTE — Progress Notes (Signed)
   Albertha Beattie Aug 02, 1981 [id number]   History:  41 y.o. G0 presents for annual exam as a new patient. C/o irregular spotting before menses last month, dark in color, lasted 6-7 days, had a period then continued to spot, bright red. No pelvic pain. Hx of DCIS with lumpectomy and radiation. Has mammo scheduled in 2 weeks.  Gynecologic History Patient's last menstrual period was 02/20/2022 (approximate). Period Pattern: (!) Irregular Contraception/Family planning:  female partner Sexually active: yes Last Pap: 2021. Results were: normal Last mammogram: 04/06/21. Results were: normal  Obstetric History OB History  Gravida Para Term Preterm AB Living  0 0 0 0 0 0  SAB IAB Ectopic Multiple Live Births  0 0 0 0 0     The following portions of the patient's history were reviewed and updated as appropriate: allergies, current medications, past family history, past medical history, past social history, past surgical history, and problem list.  Review of Systems Pertinent items noted in HPI and remainder of comprehensive ROS otherwise negative.   Past medical history, past surgical history, family history and social history were all reviewed and documented in the EPIC chart.   Exam:  Vitals:   03/25/22 1447  BP: 120/70  Weight: 233 lb (105.7 kg)  Height: 5' 6.5" (1.689 m)   Body mass index is 37.04 kg/m.  General appearance:  Normal Thyroid:  Symmetrical, normal in size, without palpable masses or nodularity. Respiratory  Auscultation:  Clear without wheezing or rhonchi Cardiovascular  Auscultation:  Regular rate, without rubs, murmurs or gallops  Edema/varicosities:  Not grossly evident Abdominal  Soft,nontender, without masses, guarding or rebound.  Liver/spleen:  No organomegaly noted  Hernia:  None appreciated  Skin  Inspection:  Grossly normal Breasts: Examined lying and sitting.   Right: Scar from lumpectomy otherwise without masses, retractions,nipple  discharge or axillary adenopathy.   Left: Without masses, retractions, nipple discharge or axillary adenopathy. Genitourinary   Inguinal/mons:  Normal without inguinal adenopathy  External genitalia:  Normal appearing vulva with no masses, tenderness, or lesions  BUS/Urethra/Skene's glands:  Normal without masses or exudate  Vagina:  Normal appearing with normal color and discharge, no lesions  Cervix:  Normal appearing without discharge or lesions  Uterus:  Enlarged uterus 8-10 weeks.Normal in shape and contour.  Mobile, nontender  Adnexa/parametria:     Rt: Normal in size, without masses or tenderness.   Lt: Normal in size, without masses or tenderness.  Anus and perineum: Normal   Patient informed chaperone available to be present for breast and pelvic exam. Patient has requested no chaperone to be present. Patient has been advised what will be completed during breast and pelvic exam.   Assessment/Plan:     1. Well woman exam with routine gynecological exam Pap due 2024  2. Enlarged uterus ? fibroid  3. Irregular periods  - Pregnancy, urine - US Transvaginal Non-OB; Future    Discussed SBE, colonoscopy and DEXA screening as directed/appropriate. Recommend 136mns of exercise weekly, including weight bearing exercise. Encouraged the use of seatbelts and sunscreen. Return in 1 year for annual or as needed.   Xaviera Flaten [name] 3:12 PM 03/25/2022

## 2022-03-30 NOTE — Progress Notes (Signed)
Chief Complaint:   OBESITY Sue Jackson (MR# [id number]) is a 41 y.o. female who presents for evaluation and treatment of obesity and related comorbidities. Current BMI is Body mass index is 35.87 kg/m. Sue Jackson has been struggling with her weight for many years and has been unsuccessful in either losing weight, maintaining weight loss, or reaching her healthy weight goal.  Sue Jackson has seen weight increase in the last couple of years.  She is always hungry and she feels like not eating triggers vertigo.  Works as a Psychologist, prison and probation services.  She denies intake of sugar sweetened beverages.  She lives with her wife.  She has a lot of hunger and cravings, and notes emotional eating.  She has a history of ETOH abuse.  Started going through perimenopause.  Having a vasomotor flushing.  She has never been on AOM.  She is on the following weight gaining medications: Gabapentin and propranolol.  Sue Jackson is currently in the action stage of change and ready to dedicate time achieving and maintaining a healthier weight. Sue Jackson is interested in becoming our patient and working on intensive lifestyle modifications including (but not limited to) diet and exercise for weight loss.  Sue Jackson's habits were reviewed today and are as follows: Her family eats meals together, her desired weight loss is 59 lbs, she has been heavy most of her life, she started gaining weight when she had her late 35s, 68, her heaviest weight ever was 265 pounds, she has significant food cravings issues, she snacks frequently in the evenings, she wakes up frequently in the middle of the night to eat, she skips meals frequently, she has problems with excessive hunger, she frequently eats larger portions than normal, and she struggles with emotional eating.  Depression Screen Sue Jackson's Food and Mood (modified PHQ-9) score was 13.     03/18/2022    8:25 AM  Depression screen PHQ 2/9  Decreased Interest 3  Down, Depressed,  Hopeless 3  PHQ - 2 Score 6  Altered sleeping 2  Tired, decreased energy 2  Change in appetite 1  Feeling bad or failure about yourself  0  Trouble concentrating 1  Moving slowly or fidgety/restless 0  Suicidal thoughts 1  PHQ-9 Score 13  Difficult doing work/chores Extremely dIfficult   Subjective:   1. Other fatigue Sue Jackson admits to daytime somnolence and admits to waking up still tired. Patient has a history of symptoms of daytime fatigue and morning fatigue. Sue Jackson generally gets 9+ hours of sleep per night, and states that she has generally restful sleep. Snoring is not present. Apneic episodes are present. Epworth Sleepiness Score is 15.   2. SOB (shortness of breath) Sue Jackson notes increasing shortness of breath with exercising and seems to be worsening over time with weight gain. She notes getting out of breath sooner with activity than she used to. This has not gotten worse recently. Sue Jackson denies shortness of breath at rest or orthopnea.  3. Bipolar 2 disorder (HCC) Sue Jackson's PHQ-9 score 13.  She is on gabapentin 500 mg twice daily, and she feels her mood is stable.  She is managed by behavioral health and has good support system.  4. Benign essential HTN Sue Jackson's blood pressure is well controlled on amlodipine-benazepril 5-10 mg daily.  She denies chest pain or side effects from her medications.  5. Prediabetes Sue Jackson's last A1c was 6.4 on 08/12/2021.  No documented hypoglycemia.  She feels worse with meal skipping.  She denies sugar cravings.  6.  Hx of migraines Sue Jackson is on propranolol and gabapentin per her Neurologist.  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  She notes eating meat helps.  Assessment/Plan:   1. Other fatigue Sharica does feel that her weight is causing her energy to be lower than it should be. Fatigue may be related to obesity, depression or many other causes. Labs will be ordered, and in the meanwhile, Latressa will  focus on self care including making healthy food choices, increasing physical activity and focusing on stress reduction.  - EKG 12-Lead - Comprehensive metabolic panel - VITAMIN D 25 Hydroxy (Vit-D Deficiency, Fractures) - Lipid panel - TSH + free T4 - Vitamin B12 - CBC - Ferritin - Iron and TIBC - Folate  2. SOB (shortness of breath) Sue Jackson does feel that she gets out of breath more easily that she used to when she exercises. Sue Jackson's shortness of breath appears to be obesity related and exercise induced. She has agreed to work on weight loss and gradually increase exercise to treat her exercise induced shortness of breath. Will continue to monitor closely.  3. Bipolar 2 disorder (HCC) Stress reduction, mindful eating, and eating on a schedule were discussed.  Sue Jackson will continue her plan of care per behavioral health.  4. Benign essential HTN Sue Jackson will continue her current blood pressure medications per her PCP.  5. Prediabetes We will check labs today  Sue Jackson will continue to work on decreasing her intake of added sugar and refined carbohydrates.  - Insulin, Free and Total - Hemoglobin A1c  6. Hx of migraines Sue Jackson will continue her current medications.  She is able to tolerate dairy without triggering migraines.  Stick to lactose-free dairy options.  7. Depression screening Sue Jackson had a positive depression screening. Depression is commonly associated with obesity and often results in emotional eating behaviors. We will monitor this closely and work on CBT to help improve the non-hunger eating patterns. Referral to Psychology may be required if no improvement is seen as she continues in our clinic.  8. Obesity, current BMI 35.9 Sue Jackson is currently in the action stage of change and her goal is to continue with weight loss efforts. I recommend Sue Jackson begin the structured treatment plan as follows:  She has agreed to the Category 3 Plan with 100-120 grams of protein  daily.  Exercise goals: Smart watch was discussed to track daily steps.  Behavioral modification strategies: increasing lean protein intake, increasing water intake, decreasing eating out, no skipping meals, keeping healthy foods in the home, better snacking choices, and decreasing junk food.  She was informed of the importance of frequent follow-up visits to maximize her success with intensive lifestyle modifications for her multiple health conditions. She was informed we would discuss her lab results at her next visit unless there is a critical issue that needs to be addressed sooner. Sue Jackson agreed to keep her next visit at the agreed upon time to discuss these results.  Objective:   Blood pressure 125/73, pulse 65, temperature 97.8 F (36.6 C), height '5\' 7"'$  (1.702 m), weight 229 lb (103.9 kg), SpO2 100 %. Body mass index is 35.87 kg/m.  EKG: Normal sinus rhythm, rate 62 BPM.  Indirect Calorimeter completed today shows a VO2 of 292 and a REE of 2016.  Her calculated basal metabolic rate is 1245 thus her basal metabolic rate is better than expected.  General: Cooperative, alert, well developed, in no acute distress. HEENT: Conjunctivae and lids unremarkable. Cardiovascular: Regular rhythm.  Lungs:  Normal work of breathing. Neurologic: No focal deficits.   Lab Results  Component Value Date   CREATININE 0.83 03/18/2022   BUN 21 03/18/2022   NA 140 03/18/2022   K 4.5 03/18/2022   CL 99 03/18/2022   CO2 22 03/18/2022   Lab Results  Component Value Date   ALT 22 03/18/2022   AST 5 03/18/2022   ALKPHOS 108 03/18/2022   BILITOT 0.4 03/18/2022   Lab Results  Component Value Date   HGBA1C 6.4 (H) 03/18/2022   HGBA1C 6.4 08/12/2021   HGBA1C 6.4 01/30/2020   HGBA1C 6.0 (A) 08/15/2018   HGBA1C 6.2 (A) 02/06/2018   No results found for: "INSULIN" Lab Results  Component Value Date   TSH 1.810 03/18/2022   Lab Results  Component Value Date   CHOL 154 03/18/2022   HDL 52  03/18/2022   LDLCALC 89 03/18/2022   TRIG 65 03/18/2022   CHOLHDL 3.0 03/18/2022   Lab Results  Component Value Date   WBC 8.4 03/18/2022   HGB 14.4 03/18/2022   HCT 46.3 03/18/2022   MCV 83 03/18/2022   PLT 325 03/18/2022   Lab Results  Component Value Date   IRON 45 03/18/2022   TIBC 396 03/18/2022   FERRITIN 29 03/18/2022   Attestation Statements:   Reviewed by clinician on day of visit: allergies, medications, problem list, medical history, surgical history, family history, social history, and previous encounter notes.   Wilhemena Durie, am acting as transcriptionist for Loyal Gambler, DO.  I have reviewed the above documentation for accuracy and completeness, and I agree with the above. Dell Ponto, DO

## 2022-04-01 ENCOUNTER — Encounter (INDEPENDENT_AMBULATORY_CARE_PROVIDER_SITE_OTHER): Payer: Self-pay | Admitting: Family Medicine

## 2022-04-01 ENCOUNTER — Ambulatory Visit (INDEPENDENT_AMBULATORY_CARE_PROVIDER_SITE_OTHER): Payer: 59 | Admitting: Family Medicine

## 2022-04-01 VITALS — BP 116/74 | HR 55 | Temp 97.8°F | Ht 67.0 in | Wt 231.0 lb

## 2022-04-01 DIAGNOSIS — E8881 Metabolic syndrome: Secondary | ICD-10-CM

## 2022-04-01 DIAGNOSIS — Z6836 Body mass index (BMI) 36.0-36.9, adult: Secondary | ICD-10-CM

## 2022-04-01 DIAGNOSIS — E559 Vitamin D deficiency, unspecified: Secondary | ICD-10-CM | POA: Insufficient documentation

## 2022-04-01 DIAGNOSIS — F32A Depression, unspecified: Secondary | ICD-10-CM | POA: Insufficient documentation

## 2022-04-01 DIAGNOSIS — E669 Obesity, unspecified: Secondary | ICD-10-CM

## 2022-04-01 DIAGNOSIS — R7303 Prediabetes: Secondary | ICD-10-CM

## 2022-04-01 DIAGNOSIS — F3289 Other specified depressive episodes: Secondary | ICD-10-CM | POA: Diagnosis not present

## 2022-04-02 ENCOUNTER — Other Ambulatory Visit (HOSPITAL_COMMUNITY): Payer: Self-pay | Admitting: Psychiatry

## 2022-04-02 DIAGNOSIS — F419 Anxiety disorder, unspecified: Secondary | ICD-10-CM

## 2022-04-02 DIAGNOSIS — F3181 Bipolar II disorder: Secondary | ICD-10-CM

## 2022-04-02 MED ORDER — QSYMIA 3.75-23 MG PO CP24: 1.0000 | ORAL_CAPSULE | Freq: Every day | ORAL | 0 refills | Status: DC

## 2022-04-02 MED ORDER — VITAMIN D (ERGOCALCIFEROL) 1.25 MG (50000 UNIT) PO CAPS: 50000.0000 [IU] | ORAL_CAPSULE | ORAL | 0 refills | Status: DC

## 2022-04-02 MED ORDER — GABAPENTIN 100 MG PO CAPS: 100.0000 mg | ORAL_CAPSULE | Freq: Three times a day (TID) | ORAL | 3 refills | Status: DC

## 2022-04-02 MED ORDER — GABAPENTIN 400 MG PO CAPS: 400.0000 mg | ORAL_CAPSULE | Freq: Three times a day (TID) | ORAL | 3 refills | Status: DC

## 2022-04-02 NOTE — Telephone Encounter (Signed)
Medication refilled and sent to preferred pharmacy

## 2022-04-03 ENCOUNTER — Telehealth: Payer: 59 | Admitting: Nurse Practitioner

## 2022-04-03 DIAGNOSIS — N921 Excessive and frequent menstruation with irregular cycle: Secondary | ICD-10-CM | POA: Diagnosis not present

## 2022-04-03 NOTE — Progress Notes (Signed)
Virtual Visit Consent   Sue Jackson, you are scheduled for a virtual visit with a [facility] provider today. Just as with appointments in the office, your consent must be obtained to participate. Your consent will be active for this visit and any virtual visit you may have with one of our providers in the next 365 days. If you have a MyChart account, a copy of this consent can be sent to you electronically.  As this is a virtual visit, video technology does not allow for your provider to perform a traditional examination. This may limit your provider's ability to fully assess your condition. If your provider identifies any concerns that need to be evaluated in person or the need to arrange testing (such as labs, EKG, etc.), we will make arrangements to do so. Although advances in technology are sophisticated, we cannot ensure that it will always work on either your end or our end. If the connection with a video visit is poor, the visit may have to be switched to a telephone visit. With either a video or telephone visit, we are not always able to ensure that we have a secure connection.  By engaging in this virtual visit, you consent to the provision of healthcare and authorize for your insurance to be billed (if applicable) for the services provided during this visit. Depending on your insurance coverage, you may receive a charge related to this service.  I need to obtain your verbal consent now. Are you willing to proceed with your visit today? Sue Jackson has provided verbal consent on 04/03/2022 for a virtual visit (video or telephone). Gildardo Pounds, NP  Date: 04/03/2022 12:26 PM  Virtual Visit via Video Note   I, Gildardo Pounds, connected with  Sue Jackson  ([id number], 1980-08-24) on 04/03/22 at 12:15 PM EDT by a video-enabled telemedicine application and verified that I am speaking with the correct person using two identifiers.  Location: Patient:  Virtual Visit Location Patient: Home Provider: Virtual Visit Location Provider: Home Office   I discussed the limitations of evaluation and management by telemedicine and the availability of in person appointments. The patient expressed understanding and agreed to proceed.    History of Present Illness: Sue Jackson is a 41 y.o. who identifies as a female who was assigned female at birth, and is being seen today for menorrhagia.  Sue Jackson reports excessive heavy menstrual bleeding over the past few days. She is currently being followed by GYN for this and has pelvic US ordered on 04-15-2022. Due to fatigue, heavy bleeding and pelvic discomfort she is requesting a work note for today.    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   Bipolar 2 disorder ([place]) 11/16/2019   Prediabetes 08/15/2018   Anxiety 02/06/2018   Low back pain radiating to right lower extremity 07/06/2017   Upper back pain, chronic 07/06/2017   Hypertension, essential, benign 03/11/2017   Bipolar disorder with depression ([place]) 03/11/2017   Severe obesity (BMI 35.0-35.9 with comorbidity) ([place]) 03/11/2017    Allergies: No Known Allergies Medications:  Current Outpatient Medications:    amLODipine-benazepril (LOTREL) 5-10 MG  capsule, TAKE 1 CAPSULE BY MOUTH EVERY DAY, Disp: 90 capsule, Rfl: 2   cetirizine (ZYRTEC ALLERGY) 10 MG tablet, Take 1 tablet (10 mg total) by mouth daily., Disp: 90 tablet, Rfl: 1   fluticasone (FLONASE) 50 MCG/ACT nasal spray, Place 2 sprays into both nostrils daily., Disp: 16 g, Rfl: 6   gabapentin (NEURONTIN) 100 MG  capsule, Take 1 capsule (100 mg total) by mouth 3 (three) times daily., Disp: 90 capsule, Rfl: 3   gabapentin (NEURONTIN) 400 MG capsule, Take 1 capsule (400 mg total) by mouth 3 (three) times daily., Disp: 90 capsule, Rfl: 3   Galcanezumab-gnlm (EMGALITY) 120 MG/ML SOAJ, Inject 120 mg into the skin every 28 (twenty-eight) days., Disp: 1.12 mL, Rfl: 5   ipratropium (ATROVENT) 0.03 % nasal spray, Place 2 sprays into both nostrils every 12 (twelve) hours., Disp: 30 mL, Rfl: 0   loperamide (IMODIUM A-D) 2 MG tablet, Take 1 tablet (2 mg total) by mouth 4 (four) times daily as needed for diarrhea or loose stools., Disp: 30 tablet, Rfl: 0   meclizine (ANTIVERT) 25 MG tablet, Take 1 tablet (25 mg total) by mouth 3 (three) times daily as needed for dizziness., Disp: 30 tablet, Rfl: 1   Phentermine-Topiramate (QSYMIA) 3.75-23 MG CP24, Take 1 capsule by mouth daily., Disp: 14 capsule, Rfl: 0   propranolol (INDERAL) 40 MG tablet, TAKE 1/2 TABLET BY MOUTH DAILY, Disp: 45 tablet, Rfl: 1   SUMAtriptan (IMITREX) 50 MG tablet, Take 1 tablet (50 mg total) by mouth every 2 (two) hours as needed for migraine. May repeat in 2 hours if headache persists or recurs., Disp: 10 tablet, Rfl: 0   Vitamin D, Ergocalciferol, (DRISDOL) 1.25 MG (50000 UNIT) CAPS capsule, Take 1 capsule (50,000 Units total) by mouth every 7 (seven) days., Disp: 4 capsule, Rfl: 0  Observations/Objective: Patient is well-developed, well-nourished in no acute distress.  Resting comfortably in bed at home.  Head is normocephalic, atraumatic.  No labored breathing.  Speech is clear and coherent with logical content.  Patient is alert and oriented at baseline.    Assessment and Plan: 1. Menorrhagia with irregular cycle Work note placed in my chart May take 200-400 mg of motrin every 8 hours as needed for pelvic pain and heavy bleeding  Follow Up Instructions: I discussed the assessment and treatment plan with the patient. The patient was  provided an opportunity to ask questions and all were answered. The patient agreed with the plan and demonstrated an understanding of the instructions.  A copy of instructions were sent to the patient via MyChart unless otherwise noted below.    The patient was advised to call back or seek an in-person evaluation if the symptoms worsen or if the condition fails to improve as anticipated.  Time:  I spent 11 minutes with the patient via telehealth technology discussing the above problems/concerns.    Gildardo Pounds, NP

## 2022-04-03 NOTE — Patient Instructions (Signed)
Sue Jackson, thank you for joining Gildardo Pounds, NP for today's virtual visit.  While this provider is not your primary care provider (PCP), if your PCP is located in our provider database this encounter information will be shared with them immediately following your visit.  Consent: (Patient) Sue Jackson provided verbal consent for this virtual visit at the beginning of the encounter.  Current Medications:  Current Outpatient Medications:    amLODipine-benazepril (LOTREL) 5-10 MG capsule, TAKE 1 CAPSULE BY MOUTH EVERY DAY, Disp: 90 capsule, Rfl: 2   cetirizine (ZYRTEC ALLERGY) 10 MG tablet, Take 1 tablet (10 mg total) by mouth daily., Disp: 90 tablet, Rfl: 1   fluticasone (FLONASE) 50 MCG/ACT nasal spray, Place 2 sprays into both nostrils daily., Disp: 16 g, Rfl: 6   gabapentin (NEURONTIN) 100 MG capsule, Take 1 capsule (100 mg total) by mouth 3 (three) times daily., Disp: 90 capsule, Rfl: 3   gabapentin (NEURONTIN) 400 MG capsule, Take 1 capsule (400 mg total) by mouth 3 (three) times daily., Disp: 90 capsule, Rfl: 3   Galcanezumab-gnlm (EMGALITY) 120 MG/ML SOAJ, Inject 120 mg into the skin every 28 (twenty-eight) days., Disp: 1.12 mL, Rfl: 5   ipratropium (ATROVENT) 0.03 % nasal spray, Place 2 sprays into both nostrils every 12 (twelve) hours., Disp: 30 mL, Rfl: 0   loperamide (IMODIUM A-D) 2 MG tablet, Take 1 tablet (2 mg total) by mouth 4 (four) times daily as needed for diarrhea or loose stools., Disp: 30 tablet, Rfl: 0   meclizine (ANTIVERT) 25 MG tablet, Take 1 tablet (25 mg total) by mouth 3 (three) times daily as needed for dizziness., Disp: 30 tablet, Rfl: 1   Phentermine-Topiramate (QSYMIA) 3.75-23 MG CP24, Take 1 capsule by mouth daily., Disp: 14 capsule, Rfl: 0   propranolol (INDERAL) 40 MG tablet, TAKE 1/2 TABLET BY MOUTH DAILY, Disp: 45 tablet, Rfl: 1   SUMAtriptan (IMITREX) 50 MG tablet, Take 1 tablet (50 mg total) by mouth every 2 (two) hours as  needed for migraine. May repeat in 2 hours if headache persists or recurs., Disp: 10 tablet, Rfl: 0   Vitamin D, Ergocalciferol, (DRISDOL) 1.25 MG (50000 UNIT) CAPS capsule, Take 1 capsule (50,000 Units total) by mouth every 7 (seven) days., Disp: 4 capsule, Rfl: 0   Medications ordered in this encounter:  No orders of the defined types were placed in this encounter.    *If you need refills on other medications prior to your next appointment, please contact your pharmacy*  Follow-Up: Call back or seek an in-person evaluation if the symptoms worsen or if the condition fails to improve as anticipated.  Other Instructions Work note placed in my chart May take 200-400 mg of motrin every 8 hours as needed for pelvic pain and heavy bleeding   If you have been instructed to have an in-person evaluation today at a local Urgent Care facility, please use the link below. It will take you to a list of all of our available [facility], including address, phone number and hours of operation. Please do not delay care.  [facility]  If you or a family member do not have a primary care provider, use the link below to schedule a visit and establish care. When you choose a [facility] primary care physician or advanced practice provider, you gain a long-term partner in health. Find a Primary Care Provider  Learn more about [facility] in-office and virtual care options: [facility] -  Get Care Now

## 2022-04-04 ENCOUNTER — Other Ambulatory Visit: Payer: Self-pay | Admitting: Nurse Practitioner

## 2022-04-04 DIAGNOSIS — G43809 Other migraine, not intractable, without status migrainosus: Secondary | ICD-10-CM

## 2022-04-05 NOTE — Progress Notes (Unsigned)
Chief Complaint:   OBESITY Sue Jackson is here to discuss her progress with her obesity treatment plan along with follow-up of her obesity related diagnoses. Sue Jackson is on Category 3 Plan with 100-120 grams of protein daily.  and states she is following her eating plan approximately 40-50% of the time. Sue Jackson states she is walking laps around her house 15 minutes 1 time per week.  Today's visit was #: 2 Starting weight: 229 lbs Starting date: 03/18/2022 Today's weight: 231 lbs Today's date: 04/01/2022 Total lbs lost to date: 0 Total lbs lost since last in-office visit: +2  Interim History: Eating 3 eggs, black beans, 2 corn tortillas at breakfast.  Same thing + meat and lunch at 6 pm.  Has greek yogurt and 2 rice cakes.  Dinner at 9 pm, beef stew, some rice and collards, eating 2-3 fruits per day.  Struggling with hunger and portion sizes.  Craves sweets at times. No consistent exercise.  Subjective:   1. Prediabetes Discussed labs with patient today. 03/18/2022, A1c 6.4.  Started to decrease intake of sugar.   2. Vitamin D deficiency Discussed labs with patient today. Vitamin D level 30.8, complains fatigue.   3. Insulin resistance Discussed labs with patient today. Fasting Insulin 16, complains of hypoglycemia due to meal skipping.   4. Other depression On gabapentin per psych.  Last PHQ-9-13  Assessment/Plan:   1. Prediabetes Continue a low sugar diet limited starches, weight loss and more consistent exercise.  Consider starting metformin.   2. Vitamin D deficiency Begin - Vitamin D, Ergocalciferol, (DRISDOL) 1.25 MG (50000 UNIT) CAPS capsule; Take 1 capsule (50,000 Units total) by mouth every 7 (seven) days.  Dispense: 4 capsule; Refill: 0  3. Insulin resistance Discussed importance of eating on a schedule and reducing high glycemic index foods.   4. Other depression Consider adding outpatient counseling.  5. Obesity, current BMI 36.2 Begin -  Phentermine-Topiramate (QSYMIA) 3.75-23 MG CP24; Take 1 capsule by mouth daily.  Dispense: 14 capsule; Refill: 0  Informed consent signed.   Megan is currently in the action stage of change. As such, her goal is to continue with weight loss efforts. She has agreed to the Category 3 Plan + 120 protein daily.   Exercise goals:  Track daily steps.   Behavioral modification strategies: increasing lean protein intake, increasing vegetables, increasing water intake, decreasing eating out, no skipping meals, keeping healthy foods in the home, better snacking choices, and decreasing junk food.  Sue Jackson has agreed to follow-up with our clinic in 3 weeks. She was informed of the importance of frequent follow-up visits to maximize her success with intensive lifestyle modifications for her multiple health conditions.   Objective:   Blood pressure 116/74, pulse (!) 55, temperature 97.8 F (36.6 C), height '5\' 7"'$  (1.702 m), weight 231 lb (104.8 kg), last menstrual period 02/20/2022, SpO2 100 %. Body mass index is 36.18 kg/m.  General: Cooperative, alert, well developed, in no acute distress. HEENT: Conjunctivae and lids unremarkable. Cardiovascular: Regular rhythm.  Lungs: Normal work of breathing. Neurologic: No focal deficits.   Lab Results  Component Value Date   CREATININE 0.83 03/18/2022   BUN 21 03/18/2022   NA 140 03/18/2022   K 4.5 03/18/2022   CL 99 03/18/2022   CO2 22 03/18/2022   Lab Results  Component Value Date   ALT 22 03/18/2022   AST 5 03/18/2022   ALKPHOS 108 03/18/2022   BILITOT 0.4 03/18/2022   Lab Results  Component Value  Date   HGBA1C 6.4 (H) 03/18/2022   HGBA1C 6.4 08/12/2021   HGBA1C 6.4 01/30/2020   HGBA1C 6.0 (A) 08/15/2018   HGBA1C 6.2 (A) 02/06/2018   No results found for: "INSULIN" Lab Results  Component Value Date   TSH 1.810 03/18/2022   Lab Results  Component Value Date   CHOL 154 03/18/2022   HDL 52 03/18/2022   LDLCALC 89 03/18/2022    TRIG 65 03/18/2022   CHOLHDL 3.0 03/18/2022   Lab Results  Component Value Date   VD25OH 30.8 03/18/2022   Lab Results  Component Value Date   WBC 8.4 03/18/2022   HGB 14.4 03/18/2022   HCT 46.3 03/18/2022   MCV 83 03/18/2022   PLT 325 03/18/2022   Lab Results  Component Value Date   IRON 45 03/18/2022   TIBC 396 03/18/2022   FERRITIN 29 03/18/2022    Attestation Statements:   Reviewed by clinician on day of visit: allergies, medications, problem list, medical history, surgical history, family history, social history, and previous encounter notes.  I, Davy Pique, am acting as Location manager for Loyal Gambler, DO.  I have reviewed the above documentation for accuracy and completeness, and I agree with the above. Dell Ponto, DO

## 2022-04-06 ENCOUNTER — Encounter (INDEPENDENT_AMBULATORY_CARE_PROVIDER_SITE_OTHER): Payer: Self-pay

## 2022-04-06 ENCOUNTER — Telehealth (INDEPENDENT_AMBULATORY_CARE_PROVIDER_SITE_OTHER): Payer: Self-pay | Admitting: Family Medicine

## 2022-04-06 NOTE — Telephone Encounter (Signed)
Dr. Valetta Close - Prior authorization approved for Qsymia. Effective: 04/05/2022 to 07/06/2022. Patient sent approval message via mychart.

## 2022-04-08 ENCOUNTER — Ambulatory Visit
Admission: RE | Admit: 2022-04-08 | Discharge: 2022-04-08 | Disposition: A | Discharge status: Home / Self Care | Payer: 59 | Source: Ambulatory Visit | Attending: Hematology | Admitting: Hematology

## 2022-04-08 DIAGNOSIS — D0511 Intraductal carcinoma in situ of right breast: Secondary | ICD-10-CM

## 2022-04-10 ENCOUNTER — Encounter (INDEPENDENT_AMBULATORY_CARE_PROVIDER_SITE_OTHER): Payer: Self-pay

## 2022-04-15 ENCOUNTER — Ambulatory Visit (INDEPENDENT_AMBULATORY_CARE_PROVIDER_SITE_OTHER): Payer: 59

## 2022-04-15 ENCOUNTER — Encounter: Payer: Self-pay | Admitting: Radiology

## 2022-04-15 ENCOUNTER — Ambulatory Visit (INDEPENDENT_AMBULATORY_CARE_PROVIDER_SITE_OTHER): Payer: 59 | Admitting: Radiology

## 2022-04-15 VITALS — BP 118/82

## 2022-04-15 DIAGNOSIS — N838 Other noninflammatory disorders of ovary, fallopian tube and broad ligament: Secondary | ICD-10-CM | POA: Diagnosis not present

## 2022-04-15 DIAGNOSIS — N926 Irregular menstruation, unspecified: Secondary | ICD-10-CM | POA: Diagnosis not present

## 2022-04-15 DIAGNOSIS — R232 Flushing: Secondary | ICD-10-CM

## 2022-04-15 NOTE — Progress Notes (Signed)
   Sue Jackson Dec 07, 1980 [id number]   History:  41 y.o. G0 presents for follow up ultrasound after she was found to have an enlarged uterus at her AEX. She had C/o irregular spotting before menses last month, dark in color, lasted 6-7 days, had a period then continued to spot, bright red. No pelvic pain. Hx of DCIS with lumpectomy and radiation. She reports hot flashes and would like her [place] and estradiol checked today.  Gynecologic History Patient's last menstrual period was 02/20/2022 (approximate).   Contraception/Family planning:  female partner Sexually active: yes Last Pap: 2021. Results were: normal Last mammogram: 04/06/21. Results were: normal  Obstetric History OB History  Gravida Para Term Preterm AB Living  0 0 0 0 0 0  SAB IAB Ectopic Multiple Live Births  0 0 0 0 0     The following portions of the patient's history were reviewed and updated as appropriate: allergies, current medications, past family history, past medical history, past social history, past surgical history, and problem list.  Review of Systems Pertinent items noted in HPI and remainder of comprehensive ROS otherwise negative.   Past medical history, past surgical history, family history and social history were all reviewed and documented in the EPIC chart.   Exam:  Vitals:   04/15/22 1423  BP: 118/82   There is no height or weight on file to calculate BMI.  Indication: BTB, enlarged uterus   Anteverted enlarged fibroid uterus 11.19 x 12.68 x 8.52cm 5 fibroids measured, intramural and subserosal the largest is left lateral fundal measuring approximately 6.4 x 5.2cm   Secretory endometrium measuring 11.31m No obvious masses or abnormal blood flow seen  Cavity is distorted by adjacent fibroids   Left ovary is enlarged with 1 avascular simple cyst 4.2 x 1.9 x 3cm and 1 avascular complex dominant follicle measuring 2.4 x 2.7 x 2.3cm, positive perfusion to the ovary   Right ovary  is normal in size with a simple avascular dominant follicle. There is a mass on right measuring 7.8 x9.2 5.8cm. Dermoid vs tumor   No free fluid  Assessment/Plan:   1. Ovarian mass - CA 125 - [facility] Surgical consult with Dr Sue Jackson 2. Hot flashes - FSH - Estradiol   Sue Jackson Jackson 3:39 PM 04/15/2022

## 2022-04-16 LAB — ESTRADIOL: Estradiol: 74 pg/mL

## 2022-04-16 LAB — CEA: CEA: <2 ng/mL

## 2022-04-16 LAB — CA 125: CA 125: 15 U/mL (ref <35)

## 2022-04-16 LAB — FOLLICLE STIMULATING HORMONE: FSH: 9 m[IU]/mL

## 2022-04-16 NOTE — Telephone Encounter (Signed)
FYI. Per Claudia: "Patient needs Thursday of Friday afternoon and that was the first available appointment."

## 2022-04-16 NOTE — Telephone Encounter (Signed)
Sue Fischer, do you recall if this was the first available for ML or this was first available for the patient? Thanks.

## 2022-04-16 NOTE — Telephone Encounter (Signed)
Ok, we can keep that appt. Thanks for checking.

## 2022-04-16 NOTE — Telephone Encounter (Signed)
Should have been scheduled at checkout.I put in a note that said ASAP with ML

## 2022-04-16 NOTE — Telephone Encounter (Signed)
Ideally something sooner is possible.

## 2022-04-19 ENCOUNTER — Encounter (INDEPENDENT_AMBULATORY_CARE_PROVIDER_SITE_OTHER): Payer: Self-pay

## 2022-04-19 NOTE — Progress Notes (Deleted)
NEUROLOGY FOLLOW UP OFFICE NOTE  Grisela Mesch [id number]  Assessment/Plan:   Vestibular migraine   ***  Due to having hypertension, would avoid Aimovig if possible. Limit use of meclizine to no more than 2 days out of week Consider magnesium citrate '400mg'$  daily, riboflavin '400mg'$  daily and Co Q-10 '100mg'$  three times daily Hydration, no skipped meals Follow up 4-5 months     Subjective:  SELEN SMUCKER is a 41 year old female with HTN, DCIS, Bipolar disorder, anxiety who follows up for vestibular migraine.  UPDATE: Intensity:  *** Duration:  *** Frequency:  *** Frequency of abortive medication: *** Current NSAIDS/analgesics:  none Current triptans:  none Current ergotamine:  none Current anti-emetic:  none Current muscle relaxants:  none Current Antihypertensive medications:  propranolol '20mg'$  daily (for anxiety), amlodipine-benazepril Current Antidepressant medications:  none Current Anticonvulsant medications:  gabapentin '400mg'$  TID (for anxiety) Current anti-CGRP:  *** Current Vitamins/Herbal/Supplements: none Current Antihistamines/Decongestants:  meclizine, Zyrtec, Flonase Other therapy:  none Hormone/birth control:  none Other medications:  none  Caffeine:  decaf coffee Alcohol:  rarely Smoker:  sometimes marijuana.  No cigarettes Diet:  No soda.  Tries to drink water Exercise:  trying to increase Depression:  stable; Anxiety:  yes Other pain:  back pain/arthritis Sleep hygiene:  okay - sometimes anxiety may case insomnia.  HISTORY:  For many years she has been experiencing dizziness.  She describes an undulating sensation associated with photophobia, phonophobia, and sometimes nausea and stuttering speech but no visual disturbance, vomiting, numbness or weakness.  Rarely may have an occipital pressure headache.  Usually lasts a day.  Progressively have gotten worse over the past few years, now occurring daily.  Triggers include chocolate, nuts,  citrus fruits, skipped meals, sleep deprivation, and heat.  Resting in cool dark and quiet room helps.  She saw ENT who told her that she has vestibular migraines.  Takes meclizine which helps.  Used to take frequently but now no more than once a week.   MRI of brain without contrast on 06/22/2020 was normal.     Past NSAIDS/analgesics:  meloxicam Past abortive triptans:  none Past abortive ergotamine:  none Past muscle relaxants:  Flexeril Past anti-emetic:  none Past antihypertensive medications:  none Past antidepressant medications:  sertraline, citalopram, fluoxetine Past anticonvulsant medications:  topiramate (side effects) Past anti-CGRP:  none Past vitamins/Herbal/Supplements:  none Past antihistamines/decongestants:  none Other past therapies:  chiropractor    Works for [employer] service Family history of headache:  sister (migraines), other sister (migraines, vertigo)  PAST MEDICAL HISTORY: Past Medical History:  Diagnosis Date   Anemia 2019-2020   Anxiety    Arthritis    Bipolar disorder ([place]) 2013   Cancer ([place]) 2021   Depression    Family history of breast cancer    Headache 2021   Hypertension    Personal history of radiation therapy    Pre-diabetes     MEDICATIONS: Current Outpatient Medications on File Prior to Visit  Medication Sig Dispense Refill   amLODipine-benazepril (LOTREL) 5-10 MG capsule TAKE 1 CAPSULE BY MOUTH EVERY DAY 90 capsule 2   cetirizine (ZYRTEC ALLERGY) 10 MG tablet Take 1 tablet (10 mg total) by mouth daily. 90 tablet 1   fluticasone (FLONASE) 50 MCG/ACT nasal spray Place 2 sprays into both nostrils daily. 16 g 6   gabapentin (NEURONTIN) 100 MG capsule Take 1 capsule (100 mg total) by mouth 3 (three) times daily. 90 capsule 3   gabapentin (NEURONTIN) 400 MG  capsule Take 1 capsule (400 mg total) by mouth 3 (three) times daily. 90 capsule 3   Galcanezumab-gnlm (EMGALITY) 120 MG/ML SOAJ Inject 120 mg into the skin every 28 (twenty-eight)  days. 1.12 mL 5   ipratropium (ATROVENT) 0.03 % nasal spray Place 2 sprays into both nostrils every 12 (twelve) hours. 30 mL 0   loperamide (IMODIUM A-D) 2 MG tablet Take 1 tablet (2 mg total) by mouth 4 (four) times daily as needed for diarrhea or loose stools. 30 tablet 0   meclizine (ANTIVERT) 25 MG tablet Take 1 tablet (25 mg total) by mouth 3 (three) times daily as needed for dizziness. 30 tablet 1   Phentermine-Topiramate (QSYMIA) 3.75-23 MG CP24 Take 1 capsule by mouth daily. 14 capsule 0   propranolol (INDERAL) 40 MG tablet TAKE 1/2 TABLET BY MOUTH DAILY 45 tablet 1   SUMAtriptan (IMITREX) 50 MG tablet Take 1 tablet (50 mg total) by mouth every 2 (two) hours as needed for migraine. May repeat in 2 hours if headache persists or recurs. 10 tablet 0   Vitamin D, Ergocalciferol, (DRISDOL) 1.25 MG (50000 UNIT) CAPS capsule Take 1 capsule (50,000 Units total) by mouth every 7 (seven) days. 4 capsule 0   No current facility-administered medications on file prior to visit.    ALLERGIES: No Known Allergies  FAMILY HISTORY: Family History  Problem Relation Age of Onset   Arthritis Mother    Asthma Mother    Cirrhosis Father        d. 74   Depression Maternal Aunt    Hypertension Maternal Aunt    Breast cancer Maternal Aunt 51   Breast cancer Maternal Aunt 59   Breast cancer Paternal Aunt    Dementia Maternal Grandmother    Diabetes Maternal Grandfather    Heart disease Maternal Grandfather    Hypertension Maternal Grandfather    Stroke Maternal Grandfather    Breast cancer Cousin        pat first cousin      Objective:  *** General: No acute distress.  Patient appears ***-groomed.   Head:  Normocephalic/atraumatic Eyes:  Fundi examined but not visualized Neck: supple, no paraspinal tenderness, full range of motion Heart:  Regular rate and rhythm Lungs:  Clear to auscultation bilaterally Back: No paraspinal tenderness Neurological Exam: alert and oriented to person, place,  and time.  Speech fluent and not dysarthric, language intact.  CN II-XII intact. Bulk and tone normal, muscle strength 5/5 throughout.  Sensation to light touch intact.  Deep tendon reflexes 2+ throughout, toes downgoing.  Finger to nose testing intact.  Gait normal, Romberg negative.   Metta Clines, DO  CC: ***

## 2022-04-20 ENCOUNTER — Encounter (INDEPENDENT_AMBULATORY_CARE_PROVIDER_SITE_OTHER): Payer: Self-pay | Admitting: Family Medicine

## 2022-04-20 ENCOUNTER — Ambulatory Visit (INDEPENDENT_AMBULATORY_CARE_PROVIDER_SITE_OTHER): Payer: 59 | Admitting: Family Medicine

## 2022-04-20 VITALS — BP 122/82 | HR 66 | Temp 97.9°F | Ht 67.0 in | Wt 228.0 lb

## 2022-04-20 DIAGNOSIS — R7303 Prediabetes: Secondary | ICD-10-CM | POA: Diagnosis not present

## 2022-04-20 DIAGNOSIS — E559 Vitamin D deficiency, unspecified: Secondary | ICD-10-CM

## 2022-04-20 DIAGNOSIS — Z6835 Body mass index (BMI) 35.0-35.9, adult: Secondary | ICD-10-CM

## 2022-04-20 DIAGNOSIS — E669 Obesity, unspecified: Secondary | ICD-10-CM | POA: Diagnosis not present

## 2022-04-20 DIAGNOSIS — D259 Leiomyoma of uterus, unspecified: Secondary | ICD-10-CM | POA: Diagnosis not present

## 2022-04-20 MED ORDER — VITAMIN D (ERGOCALCIFEROL) 1.25 MG (50000 UNIT) PO CAPS: 50000.0000 [IU] | ORAL_CAPSULE | ORAL | 0 refills | Status: DC

## 2022-04-20 MED ORDER — QSYMIA 7.5-46 MG PO CP24: ORAL_CAPSULE | ORAL | 0 refills | Status: DC

## 2022-04-21 ENCOUNTER — Ambulatory Visit: Payer: 59 | Admitting: Neurology

## 2022-04-21 ENCOUNTER — Encounter: Payer: Self-pay | Admitting: Neurology

## 2022-04-21 DIAGNOSIS — Z029 Encounter for administrative examinations, unspecified: Secondary | ICD-10-CM

## 2022-04-22 ENCOUNTER — Encounter (INDEPENDENT_AMBULATORY_CARE_PROVIDER_SITE_OTHER): Payer: Self-pay

## 2022-04-26 ENCOUNTER — Encounter (INDEPENDENT_AMBULATORY_CARE_PROVIDER_SITE_OTHER): Payer: Self-pay | Admitting: Family Medicine

## 2022-04-26 NOTE — Progress Notes (Unsigned)
Chief Complaint:   OBESITY Sue Jackson is here to discuss her progress with her obesity treatment plan along with follow-up of her obesity related diagnoses. Sue Jackson is on the Category 3 Plan+ 120 protein daily and states she is following her eating plan approximately 80% of the time. Sue Jackson states she is walking and hiking 30-60 minutes 1 time per week.  Today's visit was #: 3 Starting weight: 229 lbs Starting date: 03/18/2022 Today's weight: 228 lbs Today's date: 04/20/2022 Total lbs lost to date: 1 lb Total lbs lost since last in-office visit: 3 lbs  Interim History: Started Qysmia 3.75/23 mg, 14 days ago.  Feeling a little anxious but stress at home and work has been high.  Enjoys added satiety.  Doing better with food choices, meal prep.  Has added hiking.   Subjective:   1. Uterine leiomyoma, unspecified location Has follow up with GYN to discuss surgery.  Complains of abdominal bloating and fullness.    2. Vitamin D deficiency She is currently taking prescription vitamin D 50,000 IU each week. She denies nausea, vomiting or muscle weakness.  3. Pre-diabetes A1c at 6.4.  Actively working on decreasing sugar intake and increase walking time.  Hold off on metformin  with current GI complaints.     Assessment/Plan:   1. Uterine leiomyoma, unspecified location Plan to pause Qsymia 3 days pre-op and resume post op once off pain medications.   2. Vitamin D deficiency Recheck in November.   Refill - Vitamin D, Ergocalciferol, (DRISDOL) 1.25 MG (50000 UNIT) CAPS capsule; Take 1 capsule (50,000 Units total) by mouth every 7 (seven) days.  Dispense: 5 capsule; Refill: 0  3. Pre-diabetes Recheck A1c and fasting insulin in 2 months.   4. Obesity, current BMI 35.7 Increase - Phentermine-Topiramate (QSYMIA) 7.5-46 MG CP24; 1 tab po once daily with first meal of the day  Dispense: 30 capsule; Refill: 0  Sue Jackson is currently in the action stage of change. As such, her goal is to  continue with weight loss efforts. She has agreed to the Category 3 Plan + 120 protein daily.   Exercise goals:  Track daily steps.  Behavioral modification strategies: increasing lean protein intake, increasing water intake, increasing high fiber foods, decreasing eating out, no skipping meals, meal planning and cooking strategies, better snacking choices, emotional eating strategies, and planning for success.  Sue Jackson has agreed to follow-up with our clinic in 3 weeks. She was informed of the importance of frequent follow-up visits to maximize her success with intensive lifestyle modifications for her multiple health conditions.   Objective:   Blood pressure 122/82, pulse 66, temperature 97.9 F (36.6 C), height '5\' 7"'$  (1.702 m), weight 228 lb (103.4 kg), last menstrual period 02/20/2022, SpO2 100 %. Body mass index is 35.71 kg/m.  General: Cooperative, alert, well developed, in no acute distress. HEENT: Conjunctivae and lids unremarkable. Cardiovascular: Regular rhythm.  Lungs: Normal work of breathing. Neurologic: No focal deficits.   Lab Results  Component Value Date   CREATININE 0.83 03/18/2022   BUN 21 03/18/2022   NA 140 03/18/2022   K 4.5 03/18/2022   CL 99 03/18/2022   CO2 22 03/18/2022   Lab Results  Component Value Date   ALT 22 03/18/2022   AST 5 03/18/2022   ALKPHOS 108 03/18/2022   BILITOT 0.4 03/18/2022   Lab Results  Component Value Date   HGBA1C 6.4 (H) 03/18/2022   HGBA1C 6.4 08/12/2021   HGBA1C 6.4 01/30/2020   HGBA1C 6.0 (  A) 08/15/2018   HGBA1C 6.2 (A) 02/06/2018   No results found for: "INSULIN" Lab Results  Component Value Date   TSH 1.810 03/18/2022   Lab Results  Component Value Date   CHOL 154 03/18/2022   HDL 52 03/18/2022   LDLCALC 89 03/18/2022   TRIG 65 03/18/2022   CHOLHDL 3.0 03/18/2022   Lab Results  Component Value Date   VD25OH 30.8 03/18/2022   Lab Results  Component Value Date   WBC 8.4 03/18/2022   HGB 14.4  03/18/2022   HCT 46.3 03/18/2022   MCV 83 03/18/2022   PLT 325 03/18/2022   Lab Results  Component Value Date   IRON 45 03/18/2022   TIBC 396 03/18/2022   FERRITIN 29 03/18/2022    Attestation Statements:   Reviewed by clinician on day of visit: allergies, medications, problem list, medical history, surgical history, family history, social history, and previous encounter notes.  I, Davy Pique, am acting as Location manager for Loyal Gambler, DO.  I have reviewed the above documentation for accuracy and completeness, and I agree with the above. Dell Ponto, DO

## 2022-04-27 ENCOUNTER — Encounter (INDEPENDENT_AMBULATORY_CARE_PROVIDER_SITE_OTHER): Payer: Self-pay

## 2022-04-30 ENCOUNTER — Telehealth: Payer: 59 | Admitting: Physician Assistant

## 2022-04-30 DIAGNOSIS — N946 Dysmenorrhea, unspecified: Secondary | ICD-10-CM | POA: Diagnosis not present

## 2022-04-30 DIAGNOSIS — N921 Excessive and frequent menstruation with irregular cycle: Secondary | ICD-10-CM | POA: Diagnosis not present

## 2022-04-30 DIAGNOSIS — N838 Other noninflammatory disorders of ovary, fallopian tube and broad ligament: Secondary | ICD-10-CM | POA: Diagnosis not present

## 2022-04-30 DIAGNOSIS — D259 Leiomyoma of uterus, unspecified: Secondary | ICD-10-CM

## 2022-04-30 NOTE — Patient Instructions (Signed)
[name], thank you for joining Mar Daring, PA-C for today's virtual visit.  While this provider is not your primary care provider (PCP), if your PCP is located in our provider database this encounter information will be shared with them immediately following your visit.  Consent: (Patient) Sue Jackson provided verbal consent for this virtual visit at the beginning of the encounter.  Current Medications:  Current Outpatient Medications:    amLODipine-benazepril (LOTREL) 5-10 MG capsule, TAKE 1 CAPSULE BY MOUTH EVERY DAY, Disp: 90 capsule, Rfl: 2   cetirizine (ZYRTEC ALLERGY) 10 MG tablet, Take 1 tablet (10 mg total) by mouth daily., Disp: 90 tablet, Rfl: 1   fluticasone (FLONASE) 50 MCG/ACT nasal spray, Place 2 sprays into both nostrils daily., Disp: 16 g, Rfl: 6   gabapentin (NEURONTIN) 100 MG capsule, Take 1 capsule (100 mg total) by mouth 3 (three) times daily., Disp: 90 capsule, Rfl: 3   gabapentin (NEURONTIN) 400 MG capsule, Take 1 capsule (400 mg total) by mouth 3 (three) times daily., Disp: 90 capsule, Rfl: 3   Galcanezumab-gnlm (EMGALITY) 120 MG/ML SOAJ, Inject 120 mg into the skin every 28 (twenty-eight) days., Disp: 1.12 mL, Rfl: 5   ipratropium (ATROVENT) 0.03 % nasal spray, Place 2 sprays into both nostrils every 12 (twelve) hours., Disp: 30 mL, Rfl: 0   loperamide (IMODIUM A-D) 2 MG tablet, Take 1 tablet (2 mg total) by mouth 4 (four) times daily as needed for diarrhea or loose stools., Disp: 30 tablet, Rfl: 0   meclizine (ANTIVERT) 25 MG tablet, Take 1 tablet (25 mg total) by mouth 3 (three) times daily as needed for dizziness., Disp: 30 tablet, Rfl: 1   Phentermine-Topiramate (QSYMIA) 7.5-46 MG CP24, 1 tab po once daily with first meal of the day, Disp: 30 capsule, Rfl: 0   propranolol (INDERAL) 40 MG tablet, TAKE 1/2 TABLET BY MOUTH DAILY, Disp: 45 tablet, Rfl: 1   SUMAtriptan (IMITREX) 50 MG tablet, Take 1 tablet (50 mg total) by mouth every  2 (two) hours as needed for migraine. May repeat in 2 hours if headache persists or recurs., Disp: 10 tablet, Rfl: 0   Vitamin D, Ergocalciferol, (DRISDOL) 1.25 MG (50000 UNIT) CAPS capsule, Take 1 capsule (50,000 Units total) by mouth every 7 (seven) days., Disp: 5 capsule, Rfl: 0   Medications ordered in this encounter:  No orders of the defined types were placed in this encounter.    *If you need refills on other medications prior to your next appointment, please contact your pharmacy*  Follow-Up: Call back or seek an in-person evaluation if the symptoms worsen or if the condition fails to improve as anticipated.  [place] [phone]  Other Instructions Dysmenorrhea Dysmenorrhea refers to cramps caused by the muscles of the uterus tightening (contracting) during a menstrual period. Dysmenorrhea may be mild, or it may be severe enough to interfere with everyday activities for a few days each month. Primary dysmenorrhea is menstrual cramps that last a couple of days when a female starts having menstrual periods or soon after. As a female gets older or has a baby, the cramps will usually lessen or disappear. Secondary dysmenorrhea begins later in life and is caused by a disorder in the reproductive system. It lasts longer, and it may cause more pain than primary dysmenorrhea. The pain may start before the period and last a few days after the period. What are the causes? Dysmenorrhea is usually caused by an underlying problem, such as:  Endometriosis. The tissue that lines the uterus (endometrium) growing outside of the uterus in other areas of the body. Adenomyosis. Endometrial tissue growing into the muscular walls of the uterus. Pelvic congestive syndrome. Blood vessels in the pelvis that fill with blood just before the menstrual period. Overgrowth of cells (polyps) in the endometrium or the lower part of the uterus (cervix). Uterine prolapse. The uterus dropping down  into the vagina due to stretched or weak muscles. Bladder problems, such as infection or inflammation. Intestinal problems, such as a tumor or irritable bowel syndrome. Cancer of the reproductive organs or bladder. Other causes of this condition may result from: A severely tipped uterus. A cervix that is closed or has a small opening. Noncancerous (benign) tumors in the uterus (fibroids). Pelvic inflammatory disease (PID). Pelvic scarring (adhesions) from a previous surgery. An ovarian cyst. An IUD (intrauterine device). What increases the risk? You are more likely to develop this condition if: You are younger than 43 years old. You started puberty early. You have irregular or heavy bleeding. You have never given birth. You have a family history of dysmenorrhea. You smoke or use nicotine products. You have high body weight or a low body weight. What are the signs or symptoms? Symptoms of this condition include: Cramping, throbbing pain in lower abdomen or lower back, or a feeling of fullness in the lower abdomen. Periods lasting for longer than 7 days. Headaches. Bloating. Fatigue. Nausea or vomiting. Diarrhea or loose stools. Sweating or dizziness. How is this diagnosed? This condition may be diagnosed based on: Your symptoms. Your medical history. A physical exam. Blood tests. A Pap test. This is a test in which cells from the cervix are tested for signs of cancer or infection. A pregnancy test. You may also have other tests, including: Imaging tests, such as: Ultrasound. A procedure to remove and examine a sample of endometrial tissue (dilation and curettage, D&C). A procedure to visually examine the inside of: The uterus (hysteroscopy). The abdomen or pelvis (laparoscopy). The bladder (cystoscopy). X-rays. CT scan. MRI. How is this treated? Treatment depends on the cause of the dysmenorrhea. Treatment may include medicines, such as: Pain medicines. Hormone  replacement therapy. Injections of progesterone to stop the menstrual period. Birth control pills that contain the hormone progesterone. An IUD that contains the hormone progesterone. NSAIDs, such as ibuprofen. These may help to stop the production of hormones that cause cramps. Antidepressant medicines. Other treatment may include: Surgery to remove adhesions, endometriosis, ovarian cysts, fibroids, or the entire uterus (hysterectomy). Endometrial ablation. This is a procedure to destroy the endometrium. Presacral neurectomy. This is a procedure to cut the nerves in the bottom of the spine (sacrum) that go to the reproductive organs. Sacral nerve stimulation. This is a procedure to apply an electric current to nerves in the sacrum. Exercise and physical therapy. Meditation, yoga, and acupuncture. Work with your health care provider to determine what treatment or combination of treatments is best for you. Follow these instructions at home: Relieving pain and cramping  If directed, apply heat to your lower back or abdomen when you experience pain or cramps. Use the heat source that your health care provider recommends, such as a moist heat pack or a heating pad. Place a towel between your skin and the heat source. Leave the heat on for 20-30 minutes. Remove the heat if your skin turns bright red. This is especially important if you are unable to feel pain, heat, or cold. You may have a greater  risk of getting burned. Do not sleep with a heating pad on. Exercise. Activities such as walking, swimming, or biking can help to relieve cramps. Massage your lower back or abdomen to help relieve pain. General instructions Take over-the-counter and prescription medicines only as told by your health care provider. Ask your health care provider if the medicine prescribed to you requires you to avoid driving or using machinery. Avoid alcohol and caffeine during and right before your period. These can  make cramps worse. Do not use any products that contain nicotine or tobacco. These products include cigarettes, chewing tobacco, and vaping devices, such as e-cigarettes. If you need help quitting, ask your health care provider. Keep all follow-up visits. This is important. Contact a health care provider if: You have pain that gets worse or does not get better with medicine. You have pain with sex. You develop nausea or vomiting with your period that is not controlled with medicine. Get help right away if: You faint. Summary Dysmenorrhea refers to cramps caused by the muscles of the uterus tightening (contracting) during a menstrual period. Dysmenorrhea may be mild, or it may be severe enough to interfere with everyday activities for a few days each month. Treatment depends on the cause of the dysmenorrhea. Work with your health care provider to determine what treatment or combination of treatments is best for you. This information is not intended to replace advice given to you by your health care provider. Make sure you discuss any questions you have with your health care provider. Document Revised: 03/12/2020 Document Reviewed: 03/12/2020 Elsevier Patient Education  [place].    If you have been instructed to have an in-person evaluation today at a local Urgent Care facility, please use the link below. It will take you to a list of all of our available [facility], including address, phone number and hours of operation. Please do not delay care.  [facility]  If you or a family member do not have a primary care provider, use the link below to schedule a visit and establish care. When you choose a [facility] primary care physician or advanced practice provider, you gain a long-term partner in health. Find a Primary Care Provider  Learn more about [facility]'s in-office and virtual care options: [facility]

## 2022-04-30 NOTE — Progress Notes (Signed)
Virtual Visit Consent   Sue Jackson, you are scheduled for a virtual visit with a [facility] provider today. Just as with appointments in the office, your consent must be obtained to participate. Your consent will be active for this visit and any virtual visit you may have with one of our providers in the next 365 days. If you have a MyChart account, a copy of this consent can be sent to you electronically.  As this is a virtual visit, video technology does not allow for your provider to perform a traditional examination. This may limit your provider's ability to fully assess your condition. If your provider identifies any concerns that need to be evaluated in person or the need to arrange testing (such as labs, EKG, etc.), we will make arrangements to do so. Although advances in technology are sophisticated, we cannot ensure that it will always work on either your end or our end. If the connection with a video visit is poor, the visit may have to be switched to a telephone visit. With either a video or telephone visit, we are not always able to ensure that we have a secure connection.  By engaging in this virtual visit, you consent to the provision of healthcare and authorize for your insurance to be billed (if applicable) for the services provided during this visit. Depending on your insurance coverage, you may receive a charge related to this service.  I need to obtain your verbal consent now. Are you willing to proceed with your visit today? [name] has provided verbal consent on 04/30/2022 for a virtual visit (video or telephone). Mar Daring, PA-C  Date: 04/30/2022 1:06 PM  Virtual Visit via Video Note   I, Mar Daring, connected with  Sue Jackson  ([id number], [birth year]) on 04/30/22 at  1:00 PM EDT by a video-enabled telemedicine application and verified that I am speaking with the correct person using two  identifiers.  Location: Patient: Virtual Visit Location Patient: Home Provider: Virtual Visit Location Provider: Home Office   I discussed the limitations of evaluation and management by telemedicine and the availability of in person appointments. The patient expressed understanding and agreed to proceed.    History of Present Illness: Sue Jackson is a 41 y.o. who identifies as a female who was assigned female at birth, and is being seen today for persistent menorrhagia and dysmenorrhea. Having surgery soon, sees OB/GYN Surgeon on 05/05/22. Started menstrual cycle today and causing a lot of pain, particularly on the right side. Job requires a lot of lifting and does not feel capable today due to pain and weakness from heavy bleeding. [place] was on 04/15/22. Showed 5 fibroids, enlarged uterus, and left ovarian cyst, and indeterminate right ovarian mass (listed as tumor vs dermoid).   Problems:  Patient Active Problem List   Diagnosis Date Noted   Class 2 severe obesity with serious comorbidity and body mass index (BMI) of 35.0 to 35.9 in adult [facility]) 04/20/2022   Uterine leiomyoma 04/20/2022   Pre-diabetes 04/20/2022   Vitamin D deficiency 04/01/2022   Insulin resistance 04/01/2022   Depression 04/01/2022   Malignant neoplasm of right female breast, unspecified estrogen receptor status, unspecified site of breast (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   Bipolar 2 disorder ([place]) 11/16/2019   Prediabetes 08/15/2018   Anxiety 02/06/2018   Low back pain radiating  to right lower extremity 07/06/2017   Upper back pain, chronic 07/06/2017   Hypertension, essential, benign 03/11/2017   Bipolar disorder with depression ([place]) 03/11/2017   Severe obesity (BMI 35.0-35.9 with comorbidity) ([place]) 03/11/2017    Allergies: No Known Allergies Medications:  Current Outpatient Medications:     amLODipine-benazepril (LOTREL) 5-10 MG capsule, TAKE 1 CAPSULE BY MOUTH EVERY DAY, Disp: 90 capsule, Rfl: 2   cetirizine (ZYRTEC ALLERGY) 10 MG tablet, Take 1 tablet (10 mg total) by mouth daily., Disp: 90 tablet, Rfl: 1   fluticasone (FLONASE) 50 MCG/ACT nasal spray, Place 2 sprays into both nostrils daily., Disp: 16 g, Rfl: 6   gabapentin (NEURONTIN) 100 MG capsule, Take 1 capsule (100 mg total) by mouth 3 (three) times daily., Disp: 90 capsule, Rfl: 3   gabapentin (NEURONTIN) 400 MG capsule, Take 1 capsule (400 mg total) by mouth 3 (three) times daily., Disp: 90 capsule, Rfl: 3   Galcanezumab-gnlm (EMGALITY) 120 MG/ML SOAJ, Inject 120 mg into the skin every 28 (twenty-eight) days., Disp: 1.12 mL, Rfl: 5   ipratropium (ATROVENT) 0.03 % nasal spray, Place 2 sprays into both nostrils every 12 (twelve) hours., Disp: 30 mL, Rfl: 0   loperamide (IMODIUM A-D) 2 MG tablet, Take 1 tablet (2 mg total) by mouth 4 (four) times daily as needed for diarrhea or loose stools., Disp: 30 tablet, Rfl: 0   meclizine (ANTIVERT) 25 MG tablet, Take 1 tablet (25 mg total) by mouth 3 (three) times daily as needed for dizziness., Disp: 30 tablet, Rfl: 1   Phentermine-Topiramate (QSYMIA) 7.5-46 MG CP24, 1 tab po once daily with first meal of the day, Disp: 30 capsule, Rfl: 0   propranolol (INDERAL) 40 MG tablet, TAKE 1/2 TABLET BY MOUTH DAILY, Disp: 45 tablet, Rfl: 1   SUMAtriptan (IMITREX) 50 MG tablet, Take 1 tablet (50 mg total) by mouth every 2 (two) hours as needed for migraine. May repeat in 2 hours if headache persists or recurs., Disp: 10 tablet, Rfl: 0   Vitamin D, Ergocalciferol, (DRISDOL) 1.25 MG (50000 UNIT) CAPS capsule, Take 1 capsule (50,000 Units total) by mouth every 7 (seven) days., Disp: 5 capsule, Rfl: 0  Observations/Objective: Patient is well-developed, well-nourished in no acute distress.  Resting comfortably at home.  Head is normocephalic, atraumatic.  No labored breathing.  Speech is clear and  coherent with logical content.  Patient is alert and oriented at baseline.    Assessment and Plan: 1. Menorrhagia with irregular cycle  2. Uterine leiomyoma, unspecified location  3. Dysmenorrhea  4. Ovarian mass, right  - Work note provided - Keep scheduled follow up for next week on 05/05/22  Follow Up Instructions: I discussed the assessment and treatment plan with the patient. The patient was provided an opportunity to ask questions and all were answered. The patient agreed with the plan and demonstrated an understanding of the instructions.  A copy of instructions were sent to the patient via MyChart unless otherwise noted below.    The patient was advised to call back or seek an in-person evaluation if the symptoms worsen or if the condition fails to improve as anticipated.  Time:  I spent 8 minutes with the patient via telehealth technology discussing the above problems/concerns.    Mar Daring, PA-C

## 2022-05-05 ENCOUNTER — Ambulatory Visit: Payer: 59 | Admitting: Obstetrics & Gynecology

## 2022-05-05 ENCOUNTER — Other Ambulatory Visit (HOSPITAL_COMMUNITY)
Admission: RE | Admit: 2022-05-05 | Discharge: 2022-05-05 | Disposition: A | Discharge status: Home / Self Care | Payer: 59 | Source: Ambulatory Visit | Attending: Obstetrics & Gynecology | Admitting: Obstetrics & Gynecology

## 2022-05-05 ENCOUNTER — Telehealth (HOSPITAL_COMMUNITY): Payer: 59 | Admitting: Psychiatry

## 2022-05-05 ENCOUNTER — Encounter: Payer: Self-pay | Admitting: Obstetrics & Gynecology

## 2022-05-05 ENCOUNTER — Ambulatory Visit (INDEPENDENT_AMBULATORY_CARE_PROVIDER_SITE_OTHER): Payer: 59 | Admitting: Family Medicine

## 2022-05-05 VITALS — BP 122/78 | HR 64

## 2022-05-05 DIAGNOSIS — N926 Irregular menstruation, unspecified: Secondary | ICD-10-CM | POA: Insufficient documentation

## 2022-05-05 DIAGNOSIS — D0511 Intraductal carcinoma in situ of right breast: Secondary | ICD-10-CM | POA: Diagnosis not present

## 2022-05-05 DIAGNOSIS — N838 Other noninflammatory disorders of ovary, fallopian tube and broad ligament: Secondary | ICD-10-CM | POA: Diagnosis not present

## 2022-05-05 DIAGNOSIS — D219 Benign neoplasm of connective and other soft tissue, unspecified: Secondary | ICD-10-CM

## 2022-05-05 NOTE — Progress Notes (Signed)
Sue Jackson [place] 1981-02-25 [id number]        41 y.o.  G0 Female partner present  RP: Large Rt Ovarian Mass with Rt pelvic pain/Fibroids/Irregular menses  HPI:  Pelvic US 04/15/22 showed a large Rt Ovarian mass.  Has Rt pelvic pain.  [place] also showed Fibroids.  Seen by Retta Diones on 04/15/22 for follow up ultrasound after she was found to have an enlarged uterus at her AEX. She had C/o irregular spotting before menses last month, dark in color, lasted 6-7 days, had a period then continued to spot, bright red. No pelvic pain. 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 Occasional hot flashes but FSH 9.0 and estradiol 74 on 04/15/22, non menopausal.    OB History  Gravida Para Term Preterm AB Living  0 0 0 0 0 0  SAB IAB Ectopic Multiple Live Births  0 0 0 0 0    Past medical history,surgical history, problem list, medications, allergies, family history and social history were all reviewed and documented in the EPIC chart.   Directed ROS with pertinent positives and negatives documented in the history of present illness/assessment and plan.  Exam:  Vitals:   05/05/22 1638  BP: 122/78  Pulse: 64  SpO2: 99%   General appearance:  Normal  Pelvic US 04/15/22: Anteverted enlarged fibroid uterus 11.19 x 12.68 x 8.52cm 5 fibroids measured, intramural and subserosal the largest is left lateral fundal measuring approximately 6.4 x 5.2cm   Secretory endometrium measuring 11.6 mm No obvious masses or abnormal blood flow seen  Cavity is distorted by adjacent fibroids   Left ovary is enlarged with 1 avascular simple cyst 4.2 x 1.9 x 3cm and 1 avascular complex dominant follicle measuring 2.4 x 2.7 x 2.3cm, positive perfusion to the ovary   Right ovary is normal in size with a simple avascular dominant follicle. There is a mass on right measuring 7.8 x 9.2 x 5.8 cm. Dermoid vs tumor.   No free fluid   EBx procedure:  Written informed consent signed.  Vulva  normal.  Speculum inserted in the vagina.  Cervix/vagina normal.  Betadine prep and Hurricane spray on the Cervix.  Tenaculum applied on the anterior lip of the cervix.  Os finder.  EBx Pipette inserted in the intrauterine cavity.  Endometrial Bx with negative pressure on all endometrial surfaces.  Pipette removed.  Specimen sent to pathology.  Tenaculum removed.  Good hemostasis.  Speculum removed.  No Cx.  Well tolerated with a uterine cramp.  Post procedure precautions.   Assessment/Plan:  41 y.o. G0  1. Mass of right ovary Pelvic US 04/15/22 showed a large Rt Ovarian mass.  Has Rt pelvic pain.  [place] also showed Fibroids.  Seen by Retta Diones on 04/15/22 for follow up ultrasound after she was found to have an enlarged uterus at her AEX. She had C/o irregular spotting before menses last month, dark in color, lasted 6-7 days, had a period then continued to spot, bright red. No pelvic pain. 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 Occasional hot flashes but FSH 9.0 and estradiol 74 on 04/15/22, non menopausal.  Pelvic US findings thoroughly reviewed with patient.  Will further investigate the large 7.8 x 9.2 x 5.8 cm Rt Ovarian mass with a Pelvic MRI.  Schedule Pelvic MRI. Ca 125 repeated today.  F/U Preop. We will proceed with an XI Robotic Rt Ovarian Cystectomy/Peritoneal washings at least.  Given patient's Rt breast DCIS ER and PR positive, will consider BSO.  EBx pending to confirm no EIN/Endometrial Ca.  Patient voiced understanding and agreement with the plan. - CA 125  2. Fibroids Uterine Fibroids.  If EBx benign, will observe Fibroids given that patient has no heavy menses or pelvic pain.  3. Irregular periods Pending EBx. - Surgical pathology( [facility]/ [facility])  4. 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  [name] MD, 4:59 PM 05/05/2022

## 2022-05-06 ENCOUNTER — Telehealth: Payer: Self-pay

## 2022-05-06 ENCOUNTER — Telehealth (INDEPENDENT_AMBULATORY_CARE_PROVIDER_SITE_OTHER): Payer: 59 | Admitting: Psychiatry

## 2022-05-06 ENCOUNTER — Encounter (INDEPENDENT_AMBULATORY_CARE_PROVIDER_SITE_OTHER): Payer: Self-pay

## 2022-05-06 ENCOUNTER — Telehealth: Payer: Self-pay | Admitting: *Deleted

## 2022-05-06 ENCOUNTER — Encounter (HOSPITAL_COMMUNITY): Payer: Self-pay | Admitting: Psychiatry

## 2022-05-06 DIAGNOSIS — F3181 Bipolar II disorder: Secondary | ICD-10-CM | POA: Diagnosis not present

## 2022-05-06 DIAGNOSIS — F419 Anxiety disorder, unspecified: Secondary | ICD-10-CM | POA: Diagnosis not present

## 2022-05-06 DIAGNOSIS — N838 Other noninflammatory disorders of ovary, fallopian tube and broad ligament: Secondary | ICD-10-CM

## 2022-05-06 LAB — CA 125: CA 125: 13 U/mL (ref <35)

## 2022-05-06 MED ORDER — GABAPENTIN 400 MG PO CAPS: 400.0000 mg | ORAL_CAPSULE | Freq: Three times a day (TID) | ORAL | 3 refills | Status: DC

## 2022-05-06 MED ORDER — GABAPENTIN 100 MG PO CAPS: 100.0000 mg | ORAL_CAPSULE | Freq: Three times a day (TID) | ORAL | 3 refills | Status: DC

## 2022-05-06 MED ORDER — HYDROXYZINE HCL 10 MG PO TABS: 10.0000 mg | ORAL_TABLET | Freq: Three times a day (TID) | ORAL | 3 refills | Status: DC | PRN

## 2022-05-06 NOTE — Telephone Encounter (Signed)
-----   Message from [name], MD sent at 05/05/2022  5:18 PM EDT ----- Regarding: Schedule MRI of the Pelvis Large Rt Ovarian tumor/possible Dermoid 9+ cm.

## 2022-05-06 NOTE — Telephone Encounter (Signed)
-----   Message from [name], MD sent at 05/05/2022  5:34 PM EDT ----- Regarding: schedule surgery Surgery: XI Robotic Right Ovarian Cystectomy, Peritoneal washings.  Diagnosis: N83.201 Right Ovarian Cyst/Mass 9+ cm  Location: [facility]  Status: Outpatient  Time: 56 Minutes  Assistant: Olivia Mackie RNFA  Urgency: First Available  Pre-Op Appointment: To Be Scheduled  Post-Op Appointment(s): 2 Weeks  Time Out Of Work: 1 Week

## 2022-05-06 NOTE — Progress Notes (Signed)
[facility] MD/PA/NP OP Progress Note Virtual Visit via Telephone Note  I connected with Sue Jackson on 05/06/22 at  3:30 PM EDT by telephone and verified that I am speaking with the correct person using two identifiers.  Location: Patient: home Provider: Clinic   I discussed the limitations, risks, security and privacy concerns of performing an evaluation and management service by telephone and the availability of in person appointments. I also discussed with the patient that there may be a patient responsible charge related to this service. The patient expressed understanding and agreed to proceed.   I provided 30 minutes of non-face-to-face time during this encounter.    05/06/2022 12:19 PM Sue Jackson  MRN:  [id number]  Chief Complaint:  "I have been down about my health"  HPI: 41 year old female seen today for follow-up psychiatric evaluation. She has a psychiatric history of bipolar disorder, SI, depression, and anxiety.  She is currently managed on Gabapentin 500 mg three times daily. She notes her medication is somewhat effective in managing her psychiatric conditions.   Today patient was unable to login virtually so her assessment was done over the phone. During exam she was pleasant cooperative, and engaged in conversation. She informed Probation officer that she has been feeling down about her health.  She informed Probation officer that yesterday she thought about ending her life through hanging however notes that she did not go through with it as she has a lot to live for.  She informed Probation officer that at times she gets tired of feeling sick.  She does note that she follows up with her primary care regularly.  Today she denies SI/HI/VH, mania, paranoia.    Patient notes that she did follow-up with nutritionist and was started on weight loss medications however informed writer that it makes her anxious.  She informed Probation officer that she plans to continue with her weight loss journey as she is  lost over 20 pounds.  Provider encouraged to follow-up with nutritionist to discuss other options for weight loss.  She endorsed understanding and agreed.  Provider conducted a GAD-7 and patient scored a 16, at her last visit she scored an 11.  Provider also conducted PHQ-9 and patient scored a 13, at her last visit she scored a 4.  She endorses reduced appetite since being on a weight loss drug.  Patient endorses adequate sleep.    Patient referred to outpatient counseling for therapy.  Patient was agreeable to start hydroxyzine 10 mg 3 times daily to help manage anxiety.  Potential side effects of medication and risks vs benefits of treatment vs non-treatment were explained and discussed. All questions were answered.Provider offered a mood stabilizer, antidepressant, or antipsychotic to help manage depressive episode however patient was not agreeable.  She will continue gabapentin as prescribed.  No other concerns at this time.         Visit Diagnosis:    ICD-10-CM   1. Anxiety  F41.9 gabapentin (NEURONTIN) 100 MG capsule    gabapentin (NEURONTIN) 400 MG capsule    hydrOXYzine (ATARAX) 10 MG tablet    Ambulatory referral to Social Work    2. Bipolar 2 disorder (HCC)  F31.81 gabapentin (NEURONTIN) 100 MG capsule    gabapentin (NEURONTIN) 400 MG capsule    Ambulatory referral to Social Work      Past Psychiatric History: : Bipolar depression, anxiety.  History of 1 prior psychiatry hospitaliation in 2013 after overdosing on Effexor. Past Medical History:  Past Medical History:  Diagnosis Date  Anemia 2019-2020   Anxiety    Arthritis    Bipolar disorder ([facility]) 2013   Cancer ([facility]) 2021   Depression    Family history of breast cancer    Headache 2021   Hypertension    Personal history of radiation therapy    Pre-diabetes     Past Surgical History:  Procedure Laterality Date   BREAST BIOPSY Right 04/09/2020   BREAST BIOPSY Left 04/24/2020   x2   BREAST LUMPECTOMY Right  06/12/2020   BREAST LUMPECTOMY WITH RADIOACTIVE SEED LOCALIZATION Right 06/12/2020   Procedure: RIGHT BREAST LUMPECTOMY WITH RADIOACTIVE SEED LOCALIZATION;  Surgeon: Coralie Keens, MD;  Location: [facility];  Service: General;  Laterality: Right;  LMA    Family Psychiatric History: Maternal aunt- depression  Family History:  Family History  Problem Relation Age of Onset   Arthritis Mother    Asthma Mother    Cirrhosis Father        d. 34   Depression Maternal Aunt    Hypertension Maternal Aunt    Breast cancer Maternal Aunt 51   Breast cancer Maternal Aunt 59   Breast cancer Paternal Aunt    Dementia Maternal Grandmother    Diabetes Maternal Grandfather    Heart disease Maternal Grandfather    Hypertension Maternal Grandfather    Stroke Maternal Grandfather    Breast cancer Cousin        pat first cousin    Social History:  Social History   Socioeconomic History   Marital status: Married    Spouse name: Not on file   Number of children: 0   Years of education: Not on file   Highest education level: Not on file  Occupational History   Not on file  Tobacco Use   Smoking status: Former    Types: E-cigarettes   Smokeless tobacco: Never   Tobacco comments:    VAPES  Vaping Use   Vaping Use: Never used  Substance and Sexual Activity   Alcohol use: Yes    Comment: occasionally   Drug use: Yes    Types: Marijuana   Sexual activity: Yes    Partners: Female  Other Topics Concern   Not on file  Social History Narrative   Right handed   Social Determinants of Health   Financial Resource Strain: Low Risk  (04/16/2020)   Overall Financial Resource Strain (CARDIA)    Difficulty of Paying Living Expenses: Not very hard  Food Insecurity: No Food Insecurity (04/16/2020)   Hunger Vital Sign    Worried About Running Out of Food in the Last Year: Never true    Ran Out of Food in the Last Year: Never true  Transportation Needs: No Transportation Needs (04/16/2020)   PRAPARE -  Hydrologist (Medical): No    Lack of Transportation (Non-Medical): No  Physical Activity: Not on file  Stress: Not on file  Social Connections: Not on file    Allergies: No Known Allergies  Metabolic Disorder Labs: Lab Results  Component Value Date   HGBA1C 6.4 (H) 03/18/2022   No results found for: "PROLACTIN" Lab Results  Component Value Date   CHOL 154 03/18/2022   TRIG 65 03/18/2022   HDL 52 03/18/2022   CHOLHDL 3.0 03/18/2022   VLDL 12.4 01/30/2020   LDLCALC 89 03/18/2022   LDLCALC 76 01/30/2020   Lab Results  Component Value Date   TSH 1.810 03/18/2022   TSH 2.51 11/21/2018    Therapeutic Level  Labs: No results found for: "LITHIUM" No results found for: "VALPROATE" No results found for: "CBMZ"  Current Medications: Current Outpatient Medications  Medication Sig Dispense Refill   hydrOXYzine (ATARAX) 10 MG tablet Take 1 tablet (10 mg total) by mouth 3 (three) times daily as needed. 90 tablet 3   amLODipine-benazepril (LOTREL) 5-10 MG capsule TAKE 1 CAPSULE BY MOUTH EVERY DAY 90 capsule 2   cetirizine (ZYRTEC ALLERGY) 10 MG tablet Take 1 tablet (10 mg total) by mouth daily. 90 tablet 1   fluticasone (FLONASE) 50 MCG/ACT nasal spray Place 2 sprays into both nostrils daily. 16 g 6   gabapentin (NEURONTIN) 100 MG capsule Take 1 capsule (100 mg total) by mouth 3 (three) times daily. 90 capsule 3   gabapentin (NEURONTIN) 400 MG capsule Take 1 capsule (400 mg total) by mouth 3 (three) times daily. 90 capsule 3   Galcanezumab-gnlm (EMGALITY) 120 MG/ML SOAJ Inject 120 mg into the skin every 28 (twenty-eight) days. 1.12 mL 5   ipratropium (ATROVENT) 0.03 % nasal spray Place 2 sprays into both nostrils every 12 (twelve) hours. 30 mL 0   loperamide (IMODIUM A-D) 2 MG tablet Take 1 tablet (2 mg total) by mouth 4 (four) times daily as needed for diarrhea or loose stools. 30 tablet 0   meclizine (ANTIVERT) 25 MG tablet Take 1 tablet (25 mg total) by  mouth 3 (three) times daily as needed for dizziness. 30 tablet 1   Phentermine-Topiramate (QSYMIA) 7.5-46 MG CP24 1 tab po once daily with first meal of the day 30 capsule 0   propranolol (INDERAL) 40 MG tablet TAKE 1/2 TABLET BY MOUTH DAILY 45 tablet 1   SUMAtriptan (IMITREX) 50 MG tablet Take 1 tablet (50 mg total) by mouth every 2 (two) hours as needed for migraine. May repeat in 2 hours if headache persists or recurs. 10 tablet 0   Vitamin D, Ergocalciferol, (DRISDOL) 1.25 MG (50000 UNIT) CAPS capsule Take 1 capsule (50,000 Units total) by mouth every 7 (seven) days. 5 capsule 0   No current facility-administered medications for this visit.     Musculoskeletal: Strength & Muscle Tone:  Unable to assess due to telephone visit Gait & Station:  Unable to assess due to telephone visit Patient leans: N/A  Psychiatric Specialty Exam: Review of Systems  Last menstrual period 04/23/2022.There is no height or weight on file to calculate BMI.  General Appearance: Unable to assess due to telephone visit  Eye Contact:    Unable to assess due to telephone visit  Speech:  Clear and Coherent and Normal Rate  Volume:  Normal  Mood:  Anxious and Depressed  Affect:  Appropriate and Congruent  Thought Process:  Coherent, Goal Directed, and Linear  Orientation:  Full (Time, Place, and Person)  Thought Content: WDL and Logical   Suicidal Thoughts:  No  Homicidal Thoughts:  No  Memory:  Immediate;   Good Recent;   Good Remote;   Good  Judgement:  Good  Insight:  Good  Psychomotor Activity:    Unable to assess due to telephone visit  Concentration:  Concentration: Good and Attention Span: Good  Recall:  Good  Fund of Knowledge: Good  Language: Good  Akathisia:     Unable to assess due to telephone visit  Handed:  Right  AIMS (if indicated): not done  Assets:  Communication Skills Desire for Improvement Financial Resources/Insurance Housing Intimacy Physical Health Social Support  ADL's:   Intact  Cognition: WNL  Sleep:  Good  Screenings: GAD-7    Flowsheet Row Video Visit from 05/06/2022 in [facility] Video Visit from 02/02/2022 in [facility] Video Visit from 10/01/2021 in [facility] Counselor from 09/02/2021 in [facility] Video Visit from 07/13/2021 in [facility]  Total GAD-7 Score '16 11 9 14 16      '$ PHQ2-9    Flowsheet Row Video Visit from 05/06/2022 in [facility] Office Visit from 03/18/2022 in [facility] Office Visit from 03/17/2022 in [facility] Video Visit from 02/02/2022 in [facility] Video Visit from 10/01/2021 in [facility]  PHQ-2 Total Score '5 6 5 '$ 0 2  PHQ-9 Total Score '13 13 17 4 4      '$ Flowsheet Row Video Visit from 05/06/2022 in [facility] Video Visit from 10/01/2021 in [facility] Video Visit from 07/13/2021 in [facility] Error: Q7 should not be populated when Q6 is No Error: Q7 should not be populated when Q6 is No Error: Q7 should not be populated when Q6 is No        Assessment and Plan: Patient endorses increased anxiety, depression, and passive SI due to physical comorbidities.Patient referred to outpatient counseling for therapy.  Patient was agreeable to start hydroxyzine 10 mg 3 times daily to help manage anxiety.  Provider offered a mood stabilizer, antidepressant, or antipsychotic to help manage depressive episode however patient was not agreeable.  She will continue gabapentin as prescribed   1. Anxiety  Continue- gabapentin (NEURONTIN) 100 MG capsule; Take 1 capsule (100 mg total) by mouth 3 (three) times daily.  Dispense: 90 capsule; Refill: 3 Continue- gabapentin  (NEURONTIN) 400 MG capsule; Take 1 capsule (400 mg total) by mouth 3 (three) times daily.  Dispense: 90 capsule; Refill: 3 Start- hydrOXYzine (ATARAX) 10 MG tablet; Take 1 tablet (10 mg total) by mouth 3 (three) times daily as needed.  Dispense: 90 tablet; Refill: 3 - Ambulatory referral to Social Work  2. Bipolar 2 disorder (HCC)  Continue- gabapentin (NEURONTIN) 100 MG capsule; Take 1 capsule (100 mg total) by mouth 3 (three) times daily.  Dispense: 90 capsule; Refill: 3 Continue- gabapentin (NEURONTIN) 400 MG capsule; Take 1 capsule (400 mg total) by mouth 3 (three) times daily.  Dispense: 90 capsule; Refill: 3 - Ambulatory referral to Social Work   Follow-up in 3 months Follow-up with therapy  Salley Slaughter, NP 05/06/2022, 12:19 PM

## 2022-05-07 ENCOUNTER — Encounter: Payer: Self-pay | Admitting: Obstetrics & Gynecology

## 2022-05-07 ENCOUNTER — Encounter (INDEPENDENT_AMBULATORY_CARE_PROVIDER_SITE_OTHER): Payer: Self-pay

## 2022-05-10 LAB — SURGICAL PATHOLOGY

## 2022-05-10 NOTE — Telephone Encounter (Signed)
Pelvic MRI scheduled 05/26/22 at [place]. Routed to [place] to check on PA.

## 2022-05-10 NOTE — Telephone Encounter (Signed)
Left message to call Isamu Trammel, RN at [facility], [phone].  

## 2022-05-11 ENCOUNTER — Other Ambulatory Visit (INDEPENDENT_AMBULATORY_CARE_PROVIDER_SITE_OTHER): Payer: Self-pay | Admitting: Family Medicine

## 2022-05-13 ENCOUNTER — Ambulatory Visit (INDEPENDENT_AMBULATORY_CARE_PROVIDER_SITE_OTHER): Payer: 59 | Admitting: Clinical

## 2022-05-13 DIAGNOSIS — F3181 Bipolar II disorder: Secondary | ICD-10-CM | POA: Diagnosis not present

## 2022-05-15 NOTE — Progress Notes (Signed)
THERAPIST PROGRESS NOTE Virtual Visit via Video Note  I connected with Sue Jackson on 05/13/2022 at  3:00 PM EDT by a video enabled telemedicine application and verified that I am speaking with the correct person using two identifiers.  Location: Patient: home Provider: office   I discussed the limitations of evaluation and management by telemedicine and the availability of in person appointments. The patient expressed understanding and agreed to proceed.   Follow Up Instructions: I discussed the assessment and treatment plan with the patient. The patient was provided an opportunity to ask questions and all were answered. The patient agreed with the plan and demonstrated an understanding of the instructions.   The patient was advised to call back or seek an in-person evaluation if the symptoms worsen or if the condition fails to improve as anticipated.   Session Time: 45 minutes  Participation Level: Active  Behavioral Response: CasualAlertEuthymic  Type of Therapy: Individual Therapy  Treatment Goals addressed: Client will complete 80% of assigned homework  ProgressTowards Goals: Progressing  Interventions: CBT and Supportive  Summary:  Sue Jackson is a 41 y.o. female who presents for the scheduled appointment oriented x5, appropriately dressed, currently.  Client denies hallucinations and delusions. Client reported on today she has been doing fairly well but experiencing some stressors.  Client reported she has begun participating in a weight loss program due to her doctor diagnosing her in the prediabetic stage for type II.  Client reported she has been trying some weight loss medications that have been helpful but experiencing side effects.  Client reported she has been working on meal prepping and having a healthy diet as well as incorporating exercise at least 3 days during the week.  Client reported she is frustrated because her wife does not show  up emotionally or helping her to sustain a new lifestyle as she would like.  Client reported her wife does not seem to show empathy towards what the client is going through with her health.  Client reported she also has to go for MRI soon because a mass was found on her right ovary and they want to make sure everything else is ruled out before having surgery.  Client reported homework at the post office has approximately 1 minute but she does her best to have boundaries with focusing on what she needs to do in going home.  Client reported otherwise that she was taking hydroxyzine but had to stop because it was making her too anxious. Evidence of progress towards goal: Client reported she is incorporating physical exercise at least 3 days/week.  Suicidal/Homicidal: Nowithout intent/plan  Therapist Response:  Therapist began the appointment asking client how she has been doing since last seen. Therapist used CBT to engage using active listening and positive emotional support. Therapist used CBT to get the client time to discuss her thoughts and feelings pertaining to her health family dynamic as it affects her. Therapist used CBT to normalize the clients emotional response. Therapist used CBT to discuss finding personal motivation and reinforcing the use of positive communication skills to discuss her needs. Therapist used CBT ask the client to identify her progress with frequency of use with coping skills with continued practice in her daily activity.    Therapist assigned client homework to practice self-care.   Plan: Return again in 4 weeks.  Diagnosis: Bipolar 2 disorder  Collaboration of Care: Patient refused AEB no other needs requested by the client at this time  Patient/Guardian was advised  Release of Information must be obtained prior to any record release in order to collaborate their care with an outside provider. Patient/Guardian was advised if they have not already done so to contact  the registration department to sign all necessary forms in order for [place] to release information regarding their care.   Consent: Patient/Guardian gives verbal consent for treatment and assignment of benefits for services provided during this visit. Patient/Guardian expressed understanding and agreed to proceed.   [place], LCSW 05/13/2022

## 2022-05-15 NOTE — Plan of Care (Signed)
  Problem: Anxiety Disorder CCP Problem  1  Goal: STG: Patient will participate in at least 80% of scheduled individual psychotherapy sessions Outcome: Progressing Goal: STG: Patient will complete at least 80% of assigned homework Outcome: Progressing Goal: STG: Patient will practice problem solving skills 3 times per week for the next 4 weeks Outcome: Progressing   

## 2022-05-17 NOTE — Progress Notes (Deleted)
NEUROLOGY FOLLOW UP OFFICE NOTE  Sue Jackson [id number]  Assessment/Plan:   Vestibular migraine   ***  Due to having hypertension, would avoid Aimovig if possible. Limit use of meclizine to no more than 2 days out of week Consider magnesium citrate '400mg'$  daily, riboflavin '400mg'$  daily and Co Q-10 '100mg'$  three times daily Hydration, no skipped meals Follow up 4-5 months     Subjective:  Sue Jackson is a 41 year old female with HTN, DCIS, Bipolar disorder, anxiety who follows up for vestibular migraine.  UPDATE: Intensity:  *** Duration:  *** Frequency:  *** Frequency of abortive medication: *** Current NSAIDS/analgesics:  none Current triptans:  none Current ergotamine:  none Current anti-emetic:  none Current muscle relaxants:  none Current Antihypertensive medications:  propranolol '20mg'$  daily (for anxiety), amlodipine-benazepril Current Antidepressant medications:  none Current Anticonvulsant medications:  gabapentin '400mg'$  TID (for anxiety) Current anti-CGRP:  *** Current Vitamins/Herbal/Supplements: none Current Antihistamines/Decongestants:  meclizine, Zyrtec, Flonase Other therapy:  none Hormone/birth control:  none Other medications:  none  Caffeine:  decaf coffee Alcohol:  rarely Smoker:  sometimes marijuana.  No cigarettes Diet:  No soda.  Tries to drink water Exercise:  trying to increase Depression:  stable; Anxiety:  yes Other pain:  back pain/arthritis Sleep hygiene:  okay - sometimes anxiety may case insomnia.  HISTORY:  For many years she has been experiencing dizziness.  She describes an undulating sensation associated with photophobia, phonophobia, and sometimes nausea and stuttering speech but no visual disturbance, vomiting, numbness or weakness.  Rarely may have an occipital pressure headache.  Usually lasts a day.  Progressively have gotten worse over the past few years, now occurring daily.  Triggers include chocolate, nuts,  citrus fruits, skipped meals, sleep deprivation, and heat.  Resting in cool dark and quiet room helps.  She saw ENT who told her that she has vestibular migraines.  Takes meclizine which helps.  Used to take frequently but now no more than once a week.   MRI of brain without contrast on 06/22/2020 was normal.     Past NSAIDS/analgesics:  meloxicam Past abortive triptans:  none Past abortive ergotamine:  none Past muscle relaxants:  Flexeril Past anti-emetic:  none Past antihypertensive medications:  none Past antidepressant medications:  sertraline, citalopram, fluoxetine Past anticonvulsant medications:  topiramate (side effects) Past anti-CGRP:  none Past vitamins/Herbal/Supplements:  none Past antihistamines/decongestants:  none Other past therapies:  chiropractor    Works for [employer] service Family history of headache:  sister (migraines), other sister (migraines, vertigo)  PAST MEDICAL HISTORY: Past Medical History:  Diagnosis Date   Anemia 2019-2020   Anxiety    Arthritis    Bipolar disorder ([place]) 2013   Cancer ([place]) 2021   Depression    Family history of breast cancer    Headache 2021   Hypertension    Personal history of radiation therapy    Pre-diabetes     MEDICATIONS: Current Outpatient Medications on File Prior to Visit  Medication Sig Dispense Refill   amLODipine-benazepril (LOTREL) 5-10 MG capsule TAKE 1 CAPSULE BY MOUTH EVERY DAY 90 capsule 2   cetirizine (ZYRTEC ALLERGY) 10 MG tablet Take 1 tablet (10 mg total) by mouth daily. 90 tablet 1   fluticasone (FLONASE) 50 MCG/ACT nasal spray Place 2 sprays into both nostrils daily. 16 g 6   gabapentin (NEURONTIN) 100 MG capsule Take 1 capsule (100 mg total) by mouth 3 (three) times daily. 90 capsule 3   gabapentin (NEURONTIN) 400 MG  capsule Take 1 capsule (400 mg total) by mouth 3 (three) times daily. 90 capsule 3   Galcanezumab-gnlm (EMGALITY) 120 MG/ML SOAJ Inject 120 mg into the skin every 28 (twenty-eight)  days. 1.12 mL 5   hydrOXYzine (ATARAX) 10 MG tablet Take 1 tablet (10 mg total) by mouth 3 (three) times daily as needed. 90 tablet 3   ipratropium (ATROVENT) 0.03 % nasal spray Place 2 sprays into both nostrils every 12 (twelve) hours. 30 mL 0   loperamide (IMODIUM A-D) 2 MG tablet Take 1 tablet (2 mg total) by mouth 4 (four) times daily as needed for diarrhea or loose stools. 30 tablet 0   meclizine (ANTIVERT) 25 MG tablet Take 1 tablet (25 mg total) by mouth 3 (three) times daily as needed for dizziness. 30 tablet 1   propranolol (INDERAL) 40 MG tablet TAKE 1/2 TABLET BY MOUTH DAILY 45 tablet 1   SUMAtriptan (IMITREX) 50 MG tablet Take 1 tablet (50 mg total) by mouth every 2 (two) hours as needed for migraine. May repeat in 2 hours if headache persists or recurs. 10 tablet 0   Vitamin D, Ergocalciferol, (DRISDOL) 1.25 MG (50000 UNIT) CAPS capsule Take 1 capsule (50,000 Units total) by mouth every 7 (seven) days. 5 capsule 0   No current facility-administered medications on file prior to visit.    ALLERGIES: No Known Allergies  FAMILY HISTORY: Family History  Problem Relation Age of Onset   Arthritis Mother    Asthma Mother    Cirrhosis Father        d. 50   Depression Maternal Aunt    Hypertension Maternal Aunt    Breast cancer Maternal Aunt 51   Breast cancer Maternal Aunt 59   Breast cancer Paternal Aunt    Dementia Maternal Grandmother    Diabetes Maternal Grandfather    Heart disease Maternal Grandfather    Hypertension Maternal Grandfather    Stroke Maternal Grandfather    Breast cancer Cousin        pat first cousin      Objective:  *** General: No acute distress.  Patient appears well-groomed.   Head:  Normocephalic/atraumatic Eyes:  Fundi examined but not visualized Neck: supple, no paraspinal tenderness, full range of motion Heart:  Regular rate and rhythm Lungs:  Clear to auscultation bilaterally Back: No paraspinal tenderness Neurological Exam: alert and  oriented to person, place, and time.  Speech fluent and not dysarthric, language intact.  CN II-XII intact. Bulk and tone normal, muscle strength 5/5 throughout.  Sensation to light touch intact.  Deep tendon reflexes 2+ throughout, toes downgoing.  Finger to nose testing intact.  Gait normal, Romberg negative.   Metta Clines, DO  CC: Betty Martinique, MD

## 2022-05-18 ENCOUNTER — Ambulatory Visit: Payer: 59 | Admitting: Neurology

## 2022-05-19 ENCOUNTER — Ambulatory Visit (INDEPENDENT_AMBULATORY_CARE_PROVIDER_SITE_OTHER): Payer: 59 | Admitting: Family Medicine

## 2022-05-19 ENCOUNTER — Encounter (INDEPENDENT_AMBULATORY_CARE_PROVIDER_SITE_OTHER): Payer: Self-pay | Admitting: Family Medicine

## 2022-05-19 VITALS — BP 138/85 | HR 67 | Temp 97.8°F | Ht 67.0 in | Wt 221.0 lb

## 2022-05-19 DIAGNOSIS — E669 Obesity, unspecified: Secondary | ICD-10-CM

## 2022-05-19 DIAGNOSIS — Z6834 Body mass index (BMI) 34.0-34.9, adult: Secondary | ICD-10-CM

## 2022-05-19 DIAGNOSIS — D219 Benign neoplasm of connective and other soft tissue, unspecified: Secondary | ICD-10-CM | POA: Insufficient documentation

## 2022-05-19 DIAGNOSIS — E559 Vitamin D deficiency, unspecified: Secondary | ICD-10-CM | POA: Diagnosis not present

## 2022-05-19 DIAGNOSIS — R7303 Prediabetes: Secondary | ICD-10-CM

## 2022-05-19 DIAGNOSIS — F319 Bipolar disorder, unspecified: Secondary | ICD-10-CM | POA: Diagnosis not present

## 2022-05-19 DIAGNOSIS — D259 Leiomyoma of uterus, unspecified: Secondary | ICD-10-CM | POA: Diagnosis not present

## 2022-05-19 DIAGNOSIS — Z6835 Body mass index (BMI) 35.0-35.9, adult: Secondary | ICD-10-CM

## 2022-05-19 MED ORDER — VITAMIN D (ERGOCALCIFEROL) 1.25 MG (50000 UNIT) PO CAPS: 50000.0000 [IU] | ORAL_CAPSULE | ORAL | 0 refills | Status: DC

## 2022-05-19 MED ORDER — QSYMIA 3.75-23 MG PO CP24: ORAL_CAPSULE | ORAL | 0 refills | Status: DC

## 2022-05-19 NOTE — Telephone Encounter (Signed)
Surgery request sent.  

## 2022-05-19 NOTE — Telephone Encounter (Signed)
Spoke with patient. Reviewed surgery dates. Patient request to proceed with surgery on 07/06/22, declines earlier date of 06/01/22.  Advised patient I will forward to business office for return call. I will return call once surgery date and time confirmed. Patient verbalizes understanding and is agreeable.

## 2022-05-20 ENCOUNTER — Encounter: Payer: Self-pay | Admitting: Obstetrics & Gynecology

## 2022-05-26 ENCOUNTER — Other Ambulatory Visit: Payer: Self-pay | Admitting: Family Medicine

## 2022-05-26 ENCOUNTER — Encounter: Payer: Self-pay | Admitting: Obstetrics & Gynecology

## 2022-05-26 ENCOUNTER — Ambulatory Visit
Admission: RE | Admit: 2022-05-26 | Discharge: 2022-05-26 | Disposition: A | Discharge status: Home / Self Care | Payer: 59 | Source: Ambulatory Visit | Attending: Obstetrics & Gynecology | Admitting: Obstetrics & Gynecology

## 2022-05-26 ENCOUNTER — Ambulatory Visit (INDEPENDENT_AMBULATORY_CARE_PROVIDER_SITE_OTHER): Payer: 59 | Admitting: Obstetrics & Gynecology

## 2022-05-26 VITALS — BP 114/72 | HR 59

## 2022-05-26 DIAGNOSIS — D0511 Intraductal carcinoma in situ of right breast: Secondary | ICD-10-CM | POA: Diagnosis not present

## 2022-05-26 DIAGNOSIS — I10 Essential (primary) hypertension: Secondary | ICD-10-CM

## 2022-05-26 DIAGNOSIS — N838 Other noninflammatory disorders of ovary, fallopian tube and broad ligament: Secondary | ICD-10-CM

## 2022-05-26 DIAGNOSIS — F419 Anxiety disorder, unspecified: Secondary | ICD-10-CM

## 2022-05-26 DIAGNOSIS — N711 Chronic inflammatory disease of uterus: Secondary | ICD-10-CM

## 2022-05-26 MED ORDER — FLUCONAZOLE 150 MG PO TABS: 150.0000 mg | ORAL_TABLET | ORAL | 1 refills | Status: AC

## 2022-05-26 MED ORDER — GADOPICLENOL 0.5 MMOL/ML IV SOLN
10.0000 mL | Freq: Once | INTRAVENOUS | Status: AC | PRN
  Administered 2022-05-26: 10 mL via INTRAVENOUS

## 2022-05-26 MED ORDER — CEPHALEXIN 500 MG PO CAPS: 500.0000 mg | ORAL_CAPSULE | Freq: Two times a day (BID) | ORAL | 0 refills | Status: AC

## 2022-05-26 NOTE — Progress Notes (Signed)
    Sue Jackson November 23, 1980 [id number]        41 y.o.  G0  Accompanied by her female partner  RP: Counseling and management of Endometrial Bx with features of chronic Endometritis  HPI: Patient seen on 05/05/22, we noted:  Pelvic US 04/15/22 showed a large Rt Ovarian mass.  Has Rt pelvic pain.  [place] also showed Fibroids.  Seen by Retta Diones on 04/15/22 for follow up ultrasound after she was found to have an enlarged uterus at her AEX. She had C/o irregular spotting before menses last month, dark in color, lasted 6-7 days, had a period then continued to spot, bright red. No pelvic pain. 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 Occasional hot flashes but FSH 9.0 and estradiol 74 on 04/15/22, non menopausal.  A Pelvic US was done on 04/15/22 showing a 9.2 cm mass on the Rt ovary c/w Dermoid vs other tumor.  An MRI of the Pelvis was done today, pending results.  Then Endometrial lining on Pelvic US was at 11.6 mm, so an EBx was done on 05/05/22.  The EBx result was benign, but showed features of chronic endometritis.     OB History  Gravida Para Term Preterm AB Living  0 0 0 0 0 0  SAB IAB Ectopic Multiple Live Births  0 0 0 0 0    Past medical history,surgical history, problem list, medications, allergies, family history and social history were all reviewed and documented in the EPIC chart.   Directed ROS with pertinent positives and negatives documented in the history of present illness/assessment and plan.  Exam:  Vitals:   05/26/22 1354  BP: 114/72  Pulse: (!) 59  SpO2: 97%   General appearance:  Normal  Gynecologic exam: Deferred  EBx Patho 05/05/22: FINAL MICROSCOPIC DIAGNOSIS:   A. ENDOMETRIUM, BIOPSY:  -  Proliferative phase endometrium with stromal spindling.  -  An immunohistochemical stain for MUM1 highlights a few scattered  plasma cells, insufficient for a pathognomonic diagnosis of chronic  endometritis.  Clinical correlation recommended.     Assessment/Plan:  41 y.o. G0  1. Chronic endometritis EBx with features of chronic endometritis.  Decision to treat with Keflex 500 mg PO BID x 14 days.  Usage reviewed and prescription sent to pharmacy.  2. Mass of right ovary Pelvic MRI done today, pending results.  F/U Preop visit to discuss MRI results and plan surgery accordingly.  3. 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  Other orders - cephALEXin (KEFLEX) 500 MG capsule; Take 1 capsule (500 mg total) by mouth 2 (two) times daily for 14 days. - fluconazole (DIFLUCAN) 150 MG tablet; Take 1 tablet (150 mg total) by mouth every other day for 3 doses.    [name] MD, 2:07 PM 05/26/2022

## 2022-05-27 NOTE — Progress Notes (Signed)
Chief Complaint:   OBESITY Sue Jackson is here to discuss her progress with her obesity treatment plan along with follow-up of her obesity related diagnoses. Sue Jackson is on the Category 3 Plan+120 protein daily and states she is following her eating plan approximately 80% of the time. Sue Jackson states she is gym workouts and cardio 30-60 minutes 3 times per week.  Today's visit was #: 4 Starting weight: 229 lbs Starting date: 03/18/2022 Today's weight: 221 lbs Today's date: 05/19/2022 Total lbs lost to date: 8 lbs Total lbs lost since last in-office visit: 7 lbs  Interim History:  Has been doing well with meal plan.  Had to stop Qsymia 7.5/46 mg due to anxiety and palpitations. Did not have this issue on Qsymia  3.75/23 mg every morning.  Liked the satiety.  Trying not to skip meals.  Drinks more water.  Doing better with meal plan foods and gym workouts.   Subjective:   1. Vitamin D deficiency She is taking prescription Vitamin D 50,000 IU weekly.  Energy level improving.   2. Pre-diabetes A1c 6.4, 03/18/22.  Actively reducing intake of sugar and has increased physical activity.   3. Bipolar 1 disorder ([place]) She is on gabapentin and is using hydroxyzine prn. She is seeing a Social worker.  Anxiety has been better off Qsymia 7.5/46 mg dose.   4. Fibroids She is scheduled for hysterectomy in late November.   Assessment/Plan:   1. Vitamin D deficiency Recheck Vitamin D level next visit.   Refill - Vitamin D, Ergocalciferol, (DRISDOL) 1.25 MG (50000 UNIT) CAPS capsule; Take 1 capsule (50,000 Units total) by mouth every 7 (seven) days.  Dispense: 5 capsule; Refill: 0  2. Pre-diabetes Recheck A1c in one month.   3. Bipolar 1 disorder ([place]) Continue treatment plan per psych.  Monitor for anxiety with lower dose of Qsymia.   4. Fibroids Plan to pause Qsymia one week prior to surgery.   5. Obesity,current BMI 34.6 1) Do not skip meals.  2) Resume lower dose Qsymia.  Refill -  Phentermine-Topiramate (QSYMIA) 3.75-23 MG CP24; 1 capsule po with breakfast daily  Dispense: 30 capsule; Refill: 0  Sue Jackson is currently in the action stage of change. As such, her goal is to continue with weight loss efforts. She has agreed to the Category 3 Plan+120 protein daily.    Exercise goals:  As is.   Behavioral modification strategies: increasing lean protein intake, increasing vegetables, increasing water intake, decreasing eating out, no skipping meals, meal planning and cooking strategies, and planning for success.  Sue Jackson has agreed to follow-up with our clinic in 2-3 weeks. She was informed of the importance of frequent follow-up visits to maximize her success with intensive lifestyle modifications for her multiple health conditions.   Objective:   Blood pressure 138/85, pulse 67, temperature 97.8 F (36.6 C), height '5\' 7"'$  (1.702 m), weight 221 lb (100.2 kg), last menstrual period 04/23/2022, SpO2 99 %. Body mass index is 34.61 kg/m.  General: Cooperative, alert, well developed, in no acute distress. HEENT: Conjunctivae and lids unremarkable. Cardiovascular: Regular rhythm.  Lungs: Normal work of breathing. Neurologic: No focal deficits.   Lab Results  Component Value Date   CREATININE 0.83 03/18/2022   BUN 21 03/18/2022   NA 140 03/18/2022   K 4.5 03/18/2022   CL 99 03/18/2022   CO2 22 03/18/2022   Lab Results  Component Value Date   ALT 22 03/18/2022   AST 5 03/18/2022   ALKPHOS 108 03/18/2022  BILITOT 0.4 03/18/2022   Lab Results  Component Value Date   HGBA1C 6.4 (H) 03/18/2022   HGBA1C 6.4 08/12/2021   HGBA1C 6.4 01/30/2020   HGBA1C 6.0 (A) 08/15/2018   HGBA1C 6.2 (A) 02/06/2018   No results found for: "INSULIN" Lab Results  Component Value Date   TSH 1.810 03/18/2022   Lab Results  Component Value Date   CHOL 154 03/18/2022   HDL 52 03/18/2022   LDLCALC 89 03/18/2022   TRIG 65 03/18/2022   CHOLHDL 3.0 03/18/2022   Lab Results   Component Value Date   VD25OH 30.8 03/18/2022   Lab Results  Component Value Date   WBC 8.4 03/18/2022   HGB 14.4 03/18/2022   HCT 46.3 03/18/2022   MCV 83 03/18/2022   PLT 325 03/18/2022   Lab Results  Component Value Date   IRON 45 03/18/2022   TIBC 396 03/18/2022   FERRITIN 29 03/18/2022    Attestation Statements:   Reviewed by clinician on day of visit: allergies, medications, problem list, medical history, surgical history, family history, social history, and previous encounter notes.  I, Davy Pique, am acting as Location manager for Loyal Gambler, DO.  I have reviewed the above documentation for accuracy and completeness, and I agree with the above. Dell Ponto, DO

## 2022-05-27 NOTE — Telephone Encounter (Signed)
Spoke with patient regarding surgery benefits. Patient acknowledges understanding of information presented. Patient is aware that benefits presented are professional benefits only. Patient is aware the hospital will call with facility benefits. See account note.[place] office aware  Routing to Glorianne Manchester, Therapist, sports.

## 2022-05-28 ENCOUNTER — Encounter: Payer: Self-pay | Admitting: Obstetrics & Gynecology

## 2022-05-29 ENCOUNTER — Other Ambulatory Visit (HOSPITAL_COMMUNITY): Payer: Self-pay | Admitting: Psychiatry

## 2022-05-29 DIAGNOSIS — F419 Anxiety disorder, unspecified: Secondary | ICD-10-CM

## 2022-05-31 NOTE — Telephone Encounter (Signed)
Spoke with patient. Surgery date request confirmed.  Advised surgery is scheduled for 07/06/22, [facility] at 1030.  Surgery instruction sheet and hospital brochure reviewed, printed copy will be mailed.  Patient verbalizes understanding and is agreeable.  Routing to provider. Encounter closed.

## 2022-06-02 ENCOUNTER — Ambulatory Visit: Payer: 59 | Admitting: Neurology

## 2022-06-04 ENCOUNTER — Other Ambulatory Visit: Payer: Self-pay | Admitting: Neurology

## 2022-06-04 NOTE — Telephone Encounter (Signed)
MRI completed and results reviewed and pt was notified of results/recommendations.

## 2022-06-09 ENCOUNTER — Ambulatory Visit (INDEPENDENT_AMBULATORY_CARE_PROVIDER_SITE_OTHER): Payer: 59 | Admitting: Clinical

## 2022-06-09 DIAGNOSIS — F3181 Bipolar II disorder: Secondary | ICD-10-CM

## 2022-06-09 NOTE — Progress Notes (Signed)
THERAPIST PROGRESS NOTE Virtual Visit via Video Note  I connected with Sue Jackson on 06/09/22 at 11:00 AM EDT by a video enabled telemedicine application and verified that I am speaking with the correct person using two identifiers.  Location: Patient: home Provider: office   I discussed the limitations of evaluation and management by telemedicine and the availability of in person appointments. The patient expressed understanding and agreed to proceed.   Follow Up Instructions: I discussed the assessment and treatment plan with the patient. The patient was provided an opportunity to ask questions and all were answered. The patient agreed with the plan and demonstrated an understanding of the instructions.   The patient was advised to call back or seek an in-person evaluation if the symptoms worsen or if the condition fails to improve as anticipated.   Session Time: 55 minutes  Participation Level: Active  Behavioral Response: CasualAlertDepressed  Type of Therapy: Individual Therapy  Treatment Goals addressed: Client will complete 80% of assigned homework  ProgressTowards Goals: Progressing  Interventions: CBT  Summary:  Sue Jackson is a 41 y.o. female who presents for the scheduled appointment oriented times five, appropriately dressed,and friendly. Client denied hallucinations and delusions. Client reported on today she is doing fairly well but tired from working last night.  Client reported she has been having a lot of posterior emotions.  Client reported her primary concern continues to be her health that she wants to get back on track and feeling better.  Client reported she is preparing for surgery later this month to have a mass removed from her ovary which doctors told her to size grapefruit.  Client reported she has been very self-conscious about her food intake and does not want to disappoint her doctors.  Client reported she misses having  the ability to do things that she used to do.  Client reported sometime ago having thoughts and feelings of being frustrated and not wanting to be alive because it so hard to keep up with making sure she is doing what she needs to do throughout the day for her health.  Client reported she is mindful now to not dwell on thoughts like that.  Client reported over the past month she has missed 2 days of work due to feeling overwhelmed.  Client reported along with managing her diabetes and some other health conditions some days that she worries about feeling dizzy at work or passing out.  Client reported working on getting back healthy contributes to symptoms of depression. Evidence of progress towards goal: Client reported using 2 skills of physical exercise as well as reframing negative thoughts about herself and circumstances.  Suicidal/Homicidal: Nowithout intent/plan  Therapist Response:  Therapist began the appointment asking client how she has been on since last seen. Therapist used CBT to engage using active listening and positive emotional support towards her thoughts and feelings. Therapist used CBT to actively listened while given the client time to discuss her thoughts and feelings as a pertains to her health and how it causes her to feel depressed and/or anxious in her daily activity. Therapist used CBT to normalize the clients thoughts and feelings within reason. Therapist used CBT to discuss acceptance and doing activities within her means. Therapist used CBT ask the client to identify her progress with frequency of use with coping skills with continued practice in her daily activity.    Therapist assigned the client homework to practice self care.   Plan: Return again in 3 weeks.  Diagnosis: bipolar 2 disorder  Collaboration of Care: Patient refused AEB none requested by the client at this time.  Patient/Guardian was advised Release of Information must be obtained prior to any record  release in order to collaborate their care with an outside provider. Patient/Guardian was advised if they have not already done so to contact the registration department to sign all necessary forms in order for [place] to release information regarding their care.   Consent: Patient/Guardian gives verbal consent for treatment and assignment of benefits for services provided during this visit. Patient/Guardian expressed understanding and agreed to proceed.   [place], LCSW 06/09/2022

## 2022-06-09 NOTE — Plan of Care (Signed)
  Problem: Anxiety Disorder CCP Problem  1  Goal: STG: Patient will participate in at least 80% of scheduled individual psychotherapy sessions Outcome: Progressing Goal: STG: Patient will complete at least 80% of assigned homework Outcome: Progressing Goal: STG: Patient will practice problem solving skills 3 times per week for the next 4 weeks Outcome: Progressing

## 2022-06-15 ENCOUNTER — Telehealth: Payer: 59 | Admitting: Family Medicine

## 2022-06-15 DIAGNOSIS — G43809 Other migraine, not intractable, without status migrainosus: Secondary | ICD-10-CM

## 2022-06-15 MED ORDER — SUMATRIPTAN SUCCINATE 50 MG PO TABS: 50.0000 mg | ORAL_TABLET | ORAL | 0 refills | Status: DC | PRN

## 2022-06-15 NOTE — Patient Instructions (Addendum)
Sue Jackson, thank you for joining [name], NP for today's virtual visit.  While this provider is not your primary care provider (PCP), if your PCP is located in our provider database this encounter information will be shared with them immediately following your visit.   [name] account gives you access to today's visit and all your visits, tests, and labs performed at [name] " click here if you don't have a [name] account or go to [email or web address]  Consent: (Patient) Sue Jackson provided verbal consent for this virtual visit at the beginning of the encounter.  Current Medications:  Current Outpatient Medications:    amLODipine-benazepril (LOTREL) 5-10 MG capsule, TAKE 1 CAPSULE BY MOUTH EVERY DAY, Disp: 90 capsule, Rfl: 2   cetirizine (ZYRTEC ALLERGY) 10 MG tablet, Take 1 tablet (10 mg total) by mouth daily., Disp: 90 tablet, Rfl: 1   EMGALITY 120 MG/ML SOAJ, INJECT 120 MG INTO THE SKIN EVERY 28 (TWENTY-EIGHT) DAYS., Disp: 1 mL, Rfl: 5   fluticasone (FLONASE) 50 MCG/ACT nasal spray, Place 2 sprays into both nostrils daily., Disp: 16 g, Rfl: 6   gabapentin (NEURONTIN) 100 MG capsule, Take 1 capsule (100 mg total) by mouth 3 (three) times daily., Disp: 90 capsule, Rfl: 3   gabapentin (NEURONTIN) 400 MG capsule, Take 1 capsule (400 mg total) by mouth 3 (three) times daily., Disp: 90 capsule, Rfl: 3   hydrOXYzine (ATARAX) 10 MG tablet, TAKE 1 TABLET BY MOUTH THREE TIMES A DAY AS NEEDED, Disp: 270 tablet, Rfl: 2   ipratropium (ATROVENT) 0.03 % nasal spray, Place 2 sprays into both nostrils every 12 (twelve) hours., Disp: 30 mL, Rfl: 0   loperamide (IMODIUM A-D) 2 MG tablet, Take 1 tablet (2 mg total) by mouth 4 (four) times daily as needed for diarrhea or loose stools., Disp: 30 tablet, Rfl: 0   meclizine (ANTIVERT) 25 MG tablet, Take 1 tablet (25 mg total) by mouth 3 (three) times daily as needed for dizziness.,  Disp: 30 tablet, Rfl: 1   Phentermine-Topiramate (QSYMIA) 3.75-23 MG CP24, 1 capsule po with breakfast daily, Disp: 30 capsule, Rfl: 0   propranolol (INDERAL) 40 MG tablet, TAKE 1/2 TABLET BY MOUTH EVERY DAY, Disp: 45 tablet, Rfl: 1   SUMAtriptan (IMITREX) 50 MG tablet, Take 1 tablet (50 mg total) by mouth every 2 (two) hours as needed for migraine. May repeat in 2 hours if headache persists or recurs., Disp: 10 tablet, Rfl: 0   Vitamin D, Ergocalciferol, (DRISDOL) 1.25 MG (50000 UNIT) CAPS capsule, Take 1 capsule (50,000 Units total) by mouth every 7 (seven) days., Disp: 5 capsule, Rfl: 0   Medications ordered in this encounter:  Meds ordered this encounter  Medications   SUMAtriptan (IMITREX) 50 MG tablet    Sig: Take 1 tablet (50 mg total) by mouth every 2 (two) hours as needed for migraine. May repeat in 2 hours if headache persists or recurs.    Dispense:  10 tablet    Refill:  0    Order Specific Question:   Supervising Provider    Answer:   Sue Jackson [id number]     *If you need refills on other medications prior to your next appointment, please contact your pharmacy*  Follow-Up: Call back or seek an in-person evaluation if the symptoms worsen or if the condition fails to improve as anticipated.  [name] [phone]  Other Instructions     If you have been instructed  to have an in-person evaluation today at a local Urgent Care facility, please use the link below. It will take you to a list of all of our available [name], including address, phone number and hours of operation. Please do not delay care.  [name]  If you or a family member do not have a primary care provider, use the link below to schedule a visit and establish care. When you choose a [facility] primary care physician or advanced practice provider, you gain a long-term partner in health. Find a Primary Care Provider  Learn more about [facility]'s  in-office and virtual care options: [facility]

## 2022-06-15 NOTE — Progress Notes (Signed)
Virtual Visit Consent   Sue Jackson, you are scheduled for a virtual visit with a [facility] provider today. Just as with appointments in the office, your consent must be obtained to participate. Your consent will be active for this visit and any virtual visit you may have with one of our providers in the next 365 days. If you have a MyChart account, a copy of this consent can be sent to you electronically.  As this is a virtual visit, video technology does not allow for your provider to perform a traditional examination. This may limit your provider's ability to fully assess your condition. If your provider identifies any concerns that need to be evaluated in person or the need to arrange testing (such as labs, EKG, etc.), we will make arrangements to do so. Although advances in technology are sophisticated, we cannot ensure that it will always work on either your end or our end. If the connection with a video visit is poor, the visit may have to be switched to a telephone visit. With either a video or telephone visit, we are not always able to ensure that we have a secure connection.  By engaging in this virtual visit, you consent to the provision of healthcare and authorize for your insurance to be billed (if applicable) for the services provided during this visit. Depending on your insurance coverage, you may receive a charge related to this service.  I need to obtain your verbal consent now. Are you willing to proceed with your visit today? Sue Jackson has provided verbal consent on 06/15/2022 for a virtual visit (video or telephone). [name], NP  Date: 06/15/2022 2:20 PM  Virtual Visit via Video Note   I, [name], connected with  Sue Jackson  ([id number], 1981/06/17) on 06/15/22 at  2:15 PM EST by a video-enabled telemedicine application and verified that I am speaking with the correct person using two identifiers.  Location: Patient: Virtual  Visit Location Patient: Home Provider: Virtual Visit Location Provider: Home Office   I discussed the limitations of evaluation and management by telemedicine and the availability of in person appointments. The patient expressed understanding and agreed to proceed.    History of Present Illness: Sue Jackson is a 41 y.o. who identifies as a female who was assigned female at birth, and is being seen today for migraine. and is being seen today for an acute migraine. Has history of chronic vestibular migraines, for which she is followed by Neurology and was just started on Emgality several months back, and reports it is helping. She thinks this one is mostly stress related due to an upcoming surgery.  Denies any new or abnormal symptoms from her prior episodes. Reports she does not have her imitrex filled. Does not feel she can make it into work this evening.     Problems:  Patient Active Problem List   Diagnosis Date Noted   Bipolar 1 disorder ([address]) 05/19/2022   Fibroids 05/19/2022   Class 2 severe obesity with serious comorbidity and body mass index (BMI) of 35.0 to 35.9 in adult [facility]) 04/20/2022   Uterine leiomyoma 04/20/2022   Pre-diabetes 04/20/2022   Vitamin D deficiency 04/01/2022   Insulin resistance 04/01/2022   Depression 04/01/2022   Malignant neoplasm of right female breast, unspecified estrogen receptor status, unspecified site of breast ([place]) 08/13/2021   Genetic testing 08/20/2020   Family history of breast cancer    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   Bipolar 2 disorder ([place]) 11/16/2019   Prediabetes 08/15/2018   Anxiety 02/06/2018   Low back pain radiating to right lower extremity 07/06/2017   Upper back pain, chronic 07/06/2017   Hypertension, essential, benign 03/11/2017   Bipolar disorder with depression ([place]) 03/11/2017   Severe obesity (BMI 35.0-35.9 with comorbidity) ([place]) 03/11/2017    Allergies: No Known  Allergies Medications:  Current Outpatient Medications:    amLODipine-benazepril (LOTREL) 5-10 MG capsule, TAKE 1 CAPSULE BY MOUTH EVERY DAY, Disp: 90 capsule, Rfl: 2   cetirizine (ZYRTEC ALLERGY) 10 MG tablet, Take 1 tablet (10 mg total) by mouth daily., Disp: 90 tablet, Rfl: 1   EMGALITY 120 MG/ML SOAJ, INJECT 120 MG INTO THE SKIN EVERY 28 (TWENTY-EIGHT) DAYS., Disp: 1 mL, Rfl: 5   fluticasone (FLONASE) 50 MCG/ACT nasal spray, Place 2 sprays into both nostrils daily., Disp: 16 g, Rfl: 6   gabapentin (NEURONTIN) 100 MG capsule, Take 1 capsule (100 mg total) by mouth 3 (three) times daily., Disp: 90 capsule, Rfl: 3   gabapentin (NEURONTIN) 400 MG capsule, Take 1 capsule (400 mg total) by mouth 3 (three) times daily., Disp: 90 capsule, Rfl: 3   hydrOXYzine (ATARAX) 10 MG tablet, TAKE 1 TABLET BY MOUTH THREE TIMES A DAY AS NEEDED, Disp: 270 tablet, Rfl: 2   ipratropium (ATROVENT) 0.03 % nasal spray, Place 2 sprays into both nostrils every 12 (twelve) hours., Disp: 30 mL, Rfl: 0   loperamide (IMODIUM A-D) 2 MG tablet, Take 1 tablet (2 mg total) by mouth 4 (four) times daily as needed for diarrhea or loose stools., Disp: 30 tablet, Rfl: 0   meclizine (ANTIVERT) 25 MG tablet, Take 1 tablet (25 mg total) by mouth 3 (three) times daily as needed for dizziness., Disp: 30 tablet, Rfl: 1   Phentermine-Topiramate (QSYMIA) 3.75-23 MG CP24, 1 capsule po with breakfast daily, Disp: 30 capsule, Rfl: 0   propranolol (INDERAL) 40 MG tablet, TAKE 1/2 TABLET BY MOUTH EVERY DAY, Disp: 45 tablet, Rfl: 1   SUMAtriptan (IMITREX) 50 MG tablet, Take 1 tablet (50 mg total) by mouth every 2 (two) hours as needed for migraine. May repeat in 2 hours if headache persists or recurs., Disp: 10 tablet, Rfl: 0   Vitamin D, Ergocalciferol, (DRISDOL) 1.25 MG (50000 UNIT) CAPS capsule, Take 1 capsule (50,000 Units total) by mouth every 7 (seven) days., Disp: 5 capsule, Rfl: 0  Observations/Objective: Patient is well-developed,  well-nourished in no acute distress.  Resting comfortably  at home.  Head is normocephalic, atraumatic.  No labored breathing.  Speech is clear and coherent with logical content.  Patient is alert and oriented at baseline.    Assessment and Plan:  1. Other migraine without status migrainosus, not intractable  - SUMAtriptan (IMITREX) 50 MG tablet; Take 1 tablet (50 mg total) by mouth every 2 (two) hours as needed for migraine. May repeat in 2 hours if headache persists or recurs.  Dispense: 10 tablet; Refill: 0  Migraine triggered by stress. Encourage to use Abortive therapy and if not improved in person for injections for pain med if needed. Hydrate Rest Work note provided Call Neurologist if this continues so adjustment to meds can be made if needed   Reviewed side effects, risks and benefits of medication.    Patient acknowledged agreement and understanding of the plan.   Past Medical, Surgical, Social History, Allergies, and Medications have been Reviewed.    Follow Up Instructions: I discussed the assessment and treatment plan with  the patient. The patient was provided an opportunity to ask questions and all were answered. The patient agreed with the plan and demonstrated an understanding of the instructions.  A copy of instructions were sent to the patient via MyChart unless otherwise noted below.     The patient was advised to call back or seek an in-person evaluation if the symptoms worsen or if the condition fails to improve as anticipated.  Time:  I spent 10 minutes with the patient via telehealth technology discussing the above problems/concerns.    [name], NP

## 2022-06-16 ENCOUNTER — Encounter (INDEPENDENT_AMBULATORY_CARE_PROVIDER_SITE_OTHER): Payer: Self-pay | Admitting: Family Medicine

## 2022-06-16 ENCOUNTER — Encounter: Payer: Self-pay | Admitting: Obstetrics & Gynecology

## 2022-06-16 ENCOUNTER — Ambulatory Visit (INDEPENDENT_AMBULATORY_CARE_PROVIDER_SITE_OTHER): Payer: 59 | Admitting: Obstetrics & Gynecology

## 2022-06-16 ENCOUNTER — Ambulatory Visit (INDEPENDENT_AMBULATORY_CARE_PROVIDER_SITE_OTHER): Payer: 59 | Admitting: Family Medicine

## 2022-06-16 VITALS — BP 110/72 | HR 61 | Ht 65.75 in | Wt 227.0 lb

## 2022-06-16 VITALS — BP 119/80 | HR 58 | Temp 97.9°F | Ht 67.0 in | Wt 224.0 lb

## 2022-06-16 DIAGNOSIS — D0511 Intraductal carcinoma in situ of right breast: Secondary | ICD-10-CM

## 2022-06-16 DIAGNOSIS — E669 Obesity, unspecified: Secondary | ICD-10-CM | POA: Diagnosis not present

## 2022-06-16 DIAGNOSIS — Z6835 Body mass index (BMI) 35.0-35.9, adult: Secondary | ICD-10-CM | POA: Diagnosis not present

## 2022-06-16 DIAGNOSIS — R7303 Prediabetes: Secondary | ICD-10-CM | POA: Diagnosis not present

## 2022-06-16 DIAGNOSIS — E559 Vitamin D deficiency, unspecified: Secondary | ICD-10-CM

## 2022-06-16 DIAGNOSIS — N838 Other noninflammatory disorders of ovary, fallopian tube and broad ligament: Secondary | ICD-10-CM | POA: Diagnosis not present

## 2022-06-16 MED ORDER — QSYMIA 3.75-23 MG PO CP24: ORAL_CAPSULE | ORAL | 0 refills | Status: DC

## 2022-06-16 MED ORDER — VITAMIN D (ERGOCALCIFEROL) 1.25 MG (50000 UNIT) PO CAPS: 50000.0000 [IU] | ORAL_CAPSULE | ORAL | 0 refills | Status: DC

## 2022-06-16 NOTE — Progress Notes (Signed)
Sue Jackson 1981/05/20 [id number]        41 y.o.  G0 Accompanied by her same sex partner  RP: Preop XI Robotic assisted Laparoscopic Right Ovarian Cystectomy on 07/06/22  HPI: Last visit 05/26/22 for EBX showing features of Chronic Endometritis, treated with Keflex. MRI of the Pelvis 05/26/22 showed a Rt Ovarian Cyst c/w Dermoid at 5.3 cm.  A Lt simple Cyst at 4.4 cm was present.  Uterine Fibroids.  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 FSH 9.0 and estradiol 74 on 04/15/22, non menopausal. Patient declines Tamoxifen.  At this time, desires preservation of Fertility potential, so declines BSO.  OB History  Gravida Para Term Preterm AB Living  0 0 0 0 0 0  SAB IAB Ectopic Multiple Live Births  0 0 0 0 0    Past medical history,surgical history, problem list, medications, allergies, family history and social history were all reviewed and documented in the EPIC chart.   Directed ROS with pertinent positives and negatives documented in the history of present illness/assessment and plan.  Exam:  Vitals:   06/16/22 1110  BP: 110/72  Pulse: 61  SpO2: 99%  Weight: 227 lb (103 kg)  Height: 5' 5.75" (1.67 m)   General appearance:  Normal  Gynecologic exam: Deferred  MRI Pelvis 05/26/22: Reproductive:   -- Uterus: Measures 13.9 x 8.2 by 11.6 cm (volume = 690 cm^3). Three intramural and submucosal fibroids are seen, which measure 6.8 cm, 5.6 cm, and 5.0 cm in maximum diameter. Cervix and vagina are normal in appearance.   -- Right ovary: A right adnexal mass with fat signal intensity is seen which measures 5.3 x 4.0 cm, consistent with a benign ovarian dermoid. A moderate right hydrosalpinx is also seen.   -- Left ovary: Moderate left hydrosalpinx is seen. A simple appearing left ovarian cyst is also seen measuring 4.4 x 2.5 cm.   Other: No peritoneal thickening or abnormal free fluid.    Assessment/Plan:  41 y.o. G0  1. Mass  of right ovary Last visit 05/26/22 for EBX showing features of Chronic Endometritis, treated with Keflex. MRI of the Pelvis 05/26/22 showed a Rt Ovarian Cyst c/w Dermoid at 5.3 cm.  A Lt simple Cyst at 4.4 cm was present.  Uterine Fibroids.  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 FSH 9.0 and estradiol 74 on 04/15/22, non menopausal. Patient declines Tamoxifen.  At this time, desires preservation of Fertility potential, so declines BSO. Decision to proceed with an XI Robotic Rt Ovarian Cystectomy.  Surgery and risks thoroughly reviewed.  Patient would like to have 2 weeks off of work.  2. 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                        Patient was counseled as to the risk of surgery to include the following:  1. Infection (prohylactic antibiotics will be administered)  2. DVT/Pulmonary Embolism (prophylactic pneumo compression stockings will be used)  3.Trauma to internal organs requiring additional surgical procedure to repair any injury to internal organs requiring perhaps additional hospitalization days.  4.Hemmorhage requiring transfusion and blood products which carry risks such as anaphylactic reaction, hepatitis and AIDS  Patient had received literature information on the procedure scheduled and all her questions were answered and fully accepts all risk.   [name] MD, 11:31 AM  06/16/2022     

## 2022-06-18 ENCOUNTER — Encounter: Payer: Self-pay | Admitting: *Deleted

## 2022-06-22 LAB — VITAMIN D, 25-HYDROXY, TOTAL: Vitamin D, 25-Hydroxy, Serum: 40 ng/mL

## 2022-06-22 LAB — HEMOGLOBIN A1C
Est. average glucose Bld gHb Est-mCnc: 134 mg/dL
Hgb A1c MFr Bld: 6.3 % — ABNORMAL HIGH (ref 4.8–5.6)

## 2022-06-24 ENCOUNTER — Ambulatory Visit (INDEPENDENT_AMBULATORY_CARE_PROVIDER_SITE_OTHER): Payer: 59 | Admitting: Clinical

## 2022-06-24 DIAGNOSIS — F3181 Bipolar II disorder: Secondary | ICD-10-CM

## 2022-06-24 NOTE — Plan of Care (Signed)
  Problem: Anxiety Disorder CCP Problem  1  Goal: STG: Patient will participate in at least 80% of scheduled individual psychotherapy sessions Outcome: Progressing Goal: STG: Patient will complete at least 80% of assigned homework Outcome: Progressing Goal: STG: Patient will practice problem solving skills 3 times per week for the next 4 weeks Outcome: Progressing

## 2022-06-24 NOTE — Progress Notes (Signed)
Chief Complaint:   OBESITY Sue Jackson is here to discuss her progress with her obesity treatment plan along with follow-up of her obesity related diagnoses. Sue Jackson is on the Category 3 Plan+120 protein and states she is following her eating plan approximately 70% of the time. Sue Jackson states she is not exercising due to low back pain.   Today's visit was #: 5 Starting weight: 229 lbs Starting date: 03/18/2022 Today's weight: 224 lbs Today's date: 06/16/2022 Total lbs lost to date: 5 lbs Total lbs lost since last in-office visit: +3 lbs  Interim History: She is scheduled for a right ovarian cystectomy 11/28 with Dr Margarita Grizzle.  Has had an increase in back pain.  She did reduce Qsymia down to 3.75/23 mg due to anxiety on 7.5/4.6 mg.  Has occasionally skipped meals.  She sees improvements with satiety and cravings.  Eats breakfast everyday.   Subjective:   1. Pre-diabetes Last A1c 6.4 03/18/2022.  Sugar cravings reduced on Qsymia.  Working on reducing carbs and sugars.   2. Vitamin D deficiency She is currently taking prescription vitamin D 50,000 IU each week. She denies nausea, vomiting or muscle weakness. Energy level stable.  Last Vitamin D level 30.8 on 03/18/2022.    Assessment/Plan:   1. Pre-diabetes Recheck A1c today.  Consider use of metformin.   - Hemoglobin A1c  2. Vitamin D deficiency Recheck labs today.   Refill - Vitamin D, Ergocalciferol, (DRISDOL) 1.25 MG (50000 UNIT) CAPS capsule; Take 1 capsule (50,000 Units total) by mouth every 7 (seven) days.  Dispense: 5 capsule; Refill: 0 - Vitamin D, 25-Hydroxy, Total  3. Obesity,current BMI 35.1 Pause Qsymia for 7 days for upcoming surgery. Resume once off pain medications.  Not at risk for pregnancy due to a female partner.  Blood pressure and heart rate WNL.  Refill - Phentermine-Topiramate (QSYMIA) 3.75-23 MG CP24; 1 capsule po with breakfast daily  Dispense: 30 capsule; Refill: 0  Sue Jackson is currently in the action  stage of change. As such, her goal is to continue with weight loss efforts. She has agreed to the Category 3 Plan.   Exercise goals:  light walking 30 minutes 5 times per week.   Behavioral modification strategies: increasing lean protein intake, increasing water intake, decreasing eating out, no skipping meals, meal planning and cooking strategies, emotional eating strategies, planning for success, and decreasing junk food.  Sue Jackson has agreed to follow-up with our clinic in 4 weeks. She was informed of the importance of frequent follow-up visits to maximize her success with intensive lifestyle modifications for her multiple health conditions.   Sue Jackson was informed we would discuss her lab results at her next visit unless there is a critical issue that needs to be addressed sooner. Sue Jackson agreed to keep her next visit at the agreed upon time to discuss these results.  Objective:   Blood pressure 119/80, pulse (!) 58, temperature 97.9 F (36.6 C), height '5\' 7"'$  (1.702 m), weight 224 lb (101.6 kg), last menstrual period 05/21/2022, SpO2 100 %. Body mass index is 35.08 kg/m.  General: Cooperative, alert, well developed, in no acute distress. HEENT: Conjunctivae and lids unremarkable. Cardiovascular: Regular rhythm.  Lungs: Normal work of breathing. Neurologic: No focal deficits.   Lab Results  Component Value Date   CREATININE 0.83 03/18/2022   BUN 21 03/18/2022   NA 140 03/18/2022   K 4.5 03/18/2022   CL 99 03/18/2022   CO2 22 03/18/2022   Lab Results  Component Value Date  ALT 22 03/18/2022   AST 5 03/18/2022   ALKPHOS 108 03/18/2022   BILITOT 0.4 03/18/2022   Lab Results  Component Value Date   HGBA1C 6.3 (H) 06/16/2022   HGBA1C 6.4 (H) 03/18/2022   HGBA1C 6.4 08/12/2021   HGBA1C 6.4 01/30/2020   HGBA1C 6.0 (A) 08/15/2018   No results found for: "INSULIN" Lab Results  Component Value Date   TSH 1.810 03/18/2022   Lab Results  Component Value Date   CHOL 154  03/18/2022   HDL 52 03/18/2022   LDLCALC 89 03/18/2022   TRIG 65 03/18/2022   CHOLHDL 3.0 03/18/2022   Lab Results  Component Value Date   VD25OH 30.8 03/18/2022   Lab Results  Component Value Date   WBC 8.4 03/18/2022   HGB 14.4 03/18/2022   HCT 46.3 03/18/2022   MCV 83 03/18/2022   PLT 325 03/18/2022   Lab Results  Component Value Date   IRON 45 03/18/2022   TIBC 396 03/18/2022   FERRITIN 29 03/18/2022   Attestation Statements:   Reviewed by clinician on day of visit: allergies, medications, problem list, medical history, surgical history, family history, social history, and previous encounter notes.  I, Davy Pique, am acting as Location manager for Loyal Gambler, DO.  I have reviewed the above documentation for accuracy and completeness, and I agree with the above. Dell Ponto, DO

## 2022-06-24 NOTE — Progress Notes (Signed)
THERAPIST PROGRESS NOTE Virtual Visit via Video Note  I connected with Sue Jackson on 06/24/22 at  3:00 PM EST by a video enabled telemedicine application and verified that I am speaking with the correct person using two identifiers.  Location: Patient: home Provider: office   I discussed the limitations of evaluation and management by telemedicine and the availability of in person appointments. The patient expressed understanding and agreed to proceed.  Follow Up Instructions: I discussed the assessment and treatment plan with the patient. The patient was provided an opportunity to ask questions and all were answered. The patient agreed with the plan and demonstrated an understanding of the instructions.   The patient was advised to call back or seek an in-person evaluation if the symptoms worsen or if the condition fails to improve as anticipated.   Session Time: 55 minutes  Participation Level: Active  Behavioral Response: CasualAlertAnxious  Type of Therapy: Individual Therapy  Treatment Goals addressed: Client will practice problem-solving skills 3 times per week for the next 4 weeks  ProgressTowards Goals: Progressing  Interventions: CBT and Supportive  Summary:  Sue Jackson is a 41 y.o. female who presents for the scheduled appointment oriented times five, appropriately dressed, and friendly. Client denied hallucinations and delusions. Client reported on today she is doing okay. Client reported she is getting prepared to go into surgery to have the mass removed from her ovary. Client reported she has been thinking about if she wanted to keep her ovaries due to health history of having breast cancer. Client reported she wants to keep her eggs. Client reported she has a history of breast and ovary cancer in her mother side of family. Client reported it truthfully has been hard to make all the lifestyle changes her doctor has recommended such as  eating and exercise but she is doing better. Client reported she has always been in the doctor office for health reasons consistently which has been frustrating. Client reported its different when you have to diet for health and not due to free will. Client reported she has been thinking about how she can get out of the house. Client reported during [place] she was sick and isolated which made her look at crowds differently. Client reported her wife bought her a guitar and she wants to look into having lessons done.  Evidence of progress towards goal:  client reported exercise at least 3x per week. Client reported reframing negative thoughts at least 4x per week. Client reported 1 goal of finding a hobby to engage in on her own.  Suicidal/Homicidal: Nowithout intent/plan  Therapist Response:  Therapist began the appointment asking the client how she has been doing since last appointment. Therapist used CBT to engage using active listening and positive emotional support. Therapist used CBT to engage and give the client time to discuss her stressors with health, work, and her daily activity. Therapist used CBT to engage and normalize the clients emotions and how to reframe negative ruminating thoughts. Therapist used CBT to positively reinforce the clients use of identifying interests of her own to engage in to create a better electrolyte balance. Therapist used CBT ask the client to identify her progress with frequency of use with coping skills with continued practice in her daily activity.    Therapist assigned client homework to practice self-care.   Plan: Return again in 3 weeks.  Diagnosis: Bipolar 2 disorder  Collaboration of Care: Patient refused AEB none requested by the client at this time.  Patient/Guardian was advised Release of Information must be obtained prior to any record release in order to collaborate their care with an outside provider. Patient/Guardian was advised if they have  not already done so to contact the registration department to sign all necessary forms in order for [place] to release information regarding their care.   Consent: Patient/Guardian gives verbal consent for treatment and assignment of benefits for services provided during this visit. Patient/Guardian expressed understanding and agreed to proceed.   [place], LCSW 06/24/2022

## 2022-06-27 ENCOUNTER — Other Ambulatory Visit: Payer: Self-pay | Admitting: Family Medicine

## 2022-06-27 DIAGNOSIS — I10 Essential (primary) hypertension: Secondary | ICD-10-CM

## 2022-06-29 ENCOUNTER — Encounter (INDEPENDENT_AMBULATORY_CARE_PROVIDER_SITE_OTHER): Payer: Self-pay

## 2022-06-29 ENCOUNTER — Encounter (HOSPITAL_BASED_OUTPATIENT_CLINIC_OR_DEPARTMENT_OTHER): Payer: Self-pay | Admitting: Obstetrics & Gynecology

## 2022-06-29 NOTE — Progress Notes (Signed)
Spoke w/ via phone for pre-op interview--- Sue Jackson Lab needs dos---- UPT ISTAT              Lab results------ Current EKG in Epic dated 03/2022 COVID test -----patient states asymptomatic no test needed Arrive at -------0845 NPO after MN NO Solid Food.  Clear liquids from MN until---0745 Med rec completed Medications to take morning of surgery ----- Inderal and Gabapentin Diabetic medication ----- Patient instructed no nail polish to be worn day of surgery Patient instructed to bring photo id and insurance card day of surgery Patient aware to have Driver (ride ) / caregiver Spouse [name]    for 24 hours after surgery  Patient Special Instructions ----- Hold Qsymia starting today until after surgical procedure patient verbalized understanding. Pre-Op special Istructions ----- Patient verbalized understanding of instructions that were given at this phone interview. Patient denies shortness of breath, chest pain, fever, cough at this phone interview.

## 2022-07-05 NOTE — Anesthesia Preprocedure Evaluation (Signed)
Anesthesia Evaluation  Patient identified by MRN, date of birth, ID band Patient awake    Reviewed: Allergy & Precautions, NPO status , Patient's Chart, lab work & pertinent test results  Airway Mallampati: II  TM Distance: >3 FB Neck ROM: Full    Dental no notable dental hx.    Pulmonary Patient abstained from smoking., former smoker   Pulmonary exam normal breath sounds clear to auscultation       Cardiovascular hypertension, Normal cardiovascular exam Rhythm:Regular Rate:Normal     Neuro/Psych  Headaches PSYCHIATRIC DISORDERS   Bipolar Disorder      GI/Hepatic Neg liver ROS,,,  Endo/Other  negative endocrine ROS    Renal/GU negative Renal ROSLab Results      Component                Value               Date                      CREATININE               0.70                07/06/2022                BUN                      17                  07/06/2022                NA                       138                 07/06/2022                K                        3.7                 07/06/2022                CL                       106                 07/06/2022                CO2                      22                  03/18/2022                Musculoskeletal  (+) Arthritis ,    Abdominal  (+) + obese (BMI 36.9)  Peds  Hematology Lab Results      Component                Value               Date                      WBC  6.7                 07/06/2022                HGB                      14.3                07/06/2022                HCT                      42.0                07/06/2022                MCV                      82.8                07/06/2022                PLT                      293                 07/06/2022              Anesthesia Other Findings R Breast CA  Reproductive/Obstetrics negative OB ROS                             Anesthesia  Physical Anesthesia Plan  ASA: 2  Anesthesia Plan: General   Post-op Pain Management: Precedex, Toradol IV (intra-op)* and Ofirmev IV (intra-op)*   Induction: Intravenous  PONV Risk Score and Plan: 4 or greater and Treatment may vary due to age or medical condition, Midazolam, Ondansetron and Dexamethasone  Airway Management Planned: Oral ETT  Additional Equipment: None  Intra-op Plan:   Post-operative Plan: Extubation in OR  Informed Consent: I have reviewed the patients History and Physical, chart, labs and discussed the procedure including the risks, benefits and alternatives for the proposed anesthesia with the patient or authorized representative who has indicated his/her understanding and acceptance.     Dental advisory given  Plan Discussed with: CRNA and Anesthesiologist  Anesthesia Plan Comments:         Anesthesia Quick Evaluation

## 2022-07-06 ENCOUNTER — Ambulatory Visit (HOSPITAL_BASED_OUTPATIENT_CLINIC_OR_DEPARTMENT_OTHER): Payer: 59 | Admitting: Certified Registered Nurse Anesthetist

## 2022-07-06 ENCOUNTER — Other Ambulatory Visit: Payer: Self-pay

## 2022-07-06 ENCOUNTER — Encounter (HOSPITAL_BASED_OUTPATIENT_CLINIC_OR_DEPARTMENT_OTHER)
Admission: RE | Disposition: A | Discharge status: Home / Self Care | Payer: Self-pay | Source: Home / Self Care | Attending: Obstetrics & Gynecology

## 2022-07-06 ENCOUNTER — Encounter (HOSPITAL_BASED_OUTPATIENT_CLINIC_OR_DEPARTMENT_OTHER): Payer: Self-pay | Admitting: Obstetrics & Gynecology

## 2022-07-06 ENCOUNTER — Ambulatory Visit (HOSPITAL_BASED_OUTPATIENT_CLINIC_OR_DEPARTMENT_OTHER)
Admission: RE | Admit: 2022-07-06 | Discharge: 2022-07-06 | Disposition: A | Discharge status: Home / Self Care | Payer: 59 | Attending: Obstetrics & Gynecology | Admitting: Obstetrics & Gynecology

## 2022-07-06 DIAGNOSIS — D0511 Intraductal carcinoma in situ of right breast: Secondary | ICD-10-CM | POA: Diagnosis not present

## 2022-07-06 DIAGNOSIS — Z01818 Encounter for other preprocedural examination: Secondary | ICD-10-CM

## 2022-07-06 DIAGNOSIS — D270 Benign neoplasm of right ovary: Secondary | ICD-10-CM | POA: Diagnosis not present

## 2022-07-06 DIAGNOSIS — R7303 Prediabetes: Secondary | ICD-10-CM

## 2022-07-06 DIAGNOSIS — D369 Benign neoplasm, unspecified site: Secondary | ICD-10-CM | POA: Diagnosis not present

## 2022-07-06 DIAGNOSIS — Z9889 Other specified postprocedural states: Secondary | ICD-10-CM

## 2022-07-06 DIAGNOSIS — N83201 Unspecified ovarian cyst, right side: Secondary | ICD-10-CM | POA: Diagnosis present

## 2022-07-06 DIAGNOSIS — Z170 Estrogen receptor positive status [ER+]: Secondary | ICD-10-CM | POA: Insufficient documentation

## 2022-07-06 DIAGNOSIS — Z923 Personal history of irradiation: Secondary | ICD-10-CM | POA: Diagnosis not present

## 2022-07-06 DIAGNOSIS — N736 Female pelvic peritoneal adhesions (postinfective): Secondary | ICD-10-CM | POA: Diagnosis not present

## 2022-07-06 DIAGNOSIS — N838 Other noninflammatory disorders of ovary, fallopian tube and broad ligament: Secondary | ICD-10-CM

## 2022-07-06 DIAGNOSIS — I10 Essential (primary) hypertension: Secondary | ICD-10-CM

## 2022-07-06 HISTORY — PX: ROBOTIC ASSISTED LAPAROSCOPIC OVARIAN CYSTECTOMY: SHX6081

## 2022-07-06 HISTORY — PX: ROBOTIC ASSISTED LAPAROSCOPIC LYSIS OF ADHESION: SHX6080

## 2022-07-06 LAB — TYPE AND SCREEN
ABO/RH(D): A POS
Antibody Screen: NEGATIVE

## 2022-07-06 LAB — POCT I-STAT, CHEM 8
BUN: 17 mg/dL (ref 6–20)
Calcium, Ion: 1.05 mmol/L — ABNORMAL LOW (ref 1.15–1.40)
Chloride: 106 mmol/L (ref 98–111)
Creatinine, Ser: 0.7 mg/dL (ref 0.44–1.00)
Glucose, Bld: 118 mg/dL — ABNORMAL HIGH (ref 70–99)
HCT: 42 % (ref 36.0–46.0)
Hemoglobin: 14.3 g/dL (ref 12.0–15.0)
Potassium: 3.7 mmol/L (ref 3.5–5.1)
Sodium: 138 mmol/L (ref 135–145)
TCO2: 22 mmol/L (ref 22–32)

## 2022-07-06 LAB — CBC
HCT: 41.3 % (ref 36.0–46.0)
Hemoglobin: 13.2 g/dL (ref 12.0–15.0)
MCH: 26.5 pg (ref 26.0–34.0)
MCHC: 32 g/dL (ref 30.0–36.0)
MCV: 82.8 fL (ref 80.0–100.0)
Platelets: 293 10*3/uL (ref 150–400)
RBC: 4.99 MIL/uL (ref 3.87–5.11)
RDW: 16.7 % — ABNORMAL HIGH (ref 11.5–15.5)
WBC: 6.7 10*3/uL (ref 4.0–10.5)
nRBC: 0 % (ref 0.0–0.2)

## 2022-07-06 LAB — POCT PREGNANCY, URINE: Preg Test, Ur: NEGATIVE

## 2022-07-06 LAB — ABO/RH: ABO/RH(D): A POS

## 2022-07-06 SURGERY — EXCISION, CYST, OVARY, ROBOT-ASSISTED, LAPAROSCOPIC
Anesthesia: General | Site: Abdomen | Laterality: Right

## 2022-07-06 MED ORDER — ACETAMINOPHEN 10 MG/ML IV SOLN
INTRAVENOUS | Status: AC
  Filled 2022-07-06: qty 100

## 2022-07-06 MED ORDER — GLYCOPYRROLATE 0.2 MG/ML IJ SOLN
INTRAMUSCULAR | Status: DC | PRN
  Administered 2022-07-06: .2 mg via INTRAVENOUS

## 2022-07-06 MED ORDER — STERILE WATER FOR IRRIGATION IR SOLN
Status: DC | PRN
  Administered 2022-07-06: 500 mL

## 2022-07-06 MED ORDER — MIDAZOLAM HCL 2 MG/2ML IJ SOLN
INTRAMUSCULAR | Status: AC
  Filled 2022-07-06: qty 2

## 2022-07-06 MED ORDER — FENTANYL CITRATE (PF) 250 MCG/5ML IJ SOLN
INTRAMUSCULAR | Status: AC
  Filled 2022-07-06: qty 5

## 2022-07-06 MED ORDER — MIDAZOLAM HCL 2 MG/2ML IJ SOLN
INTRAMUSCULAR | Status: DC | PRN
  Administered 2022-07-06: 2 mg via INTRAVENOUS

## 2022-07-06 MED ORDER — POVIDONE-IODINE 10 % EX SWAB: 2.0000 | Freq: Once | CUTANEOUS | Status: DC

## 2022-07-06 MED ORDER — LACTATED RINGERS IV SOLN: INTRAVENOUS | Status: DC

## 2022-07-06 MED ORDER — ROCURONIUM BROMIDE 10 MG/ML (PF) SYRINGE
PREFILLED_SYRINGE | INTRAVENOUS | Status: DC | PRN
  Administered 2022-07-06 (×2): 50 mg via INTRAVENOUS

## 2022-07-06 MED ORDER — PROPOFOL 10 MG/ML IV BOLUS
INTRAVENOUS | Status: DC | PRN
  Administered 2022-07-06: 160 mg via INTRAVENOUS
  Administered 2022-07-06: 40 mg via INTRAVENOUS

## 2022-07-06 MED ORDER — EPHEDRINE SULFATE-NACL 50-0.9 MG/10ML-% IV SOSY
PREFILLED_SYRINGE | INTRAVENOUS | Status: DC | PRN
  Administered 2022-07-06 (×3): 5 mg via INTRAVENOUS

## 2022-07-06 MED ORDER — ACETAMINOPHEN 10 MG/ML IV SOLN
INTRAVENOUS | Status: DC | PRN
  Administered 2022-07-06: 1000 mg via INTRAVENOUS

## 2022-07-06 MED ORDER — PHENYLEPHRINE 80 MCG/ML (10ML) SYRINGE FOR IV PUSH (FOR BLOOD PRESSURE SUPPORT)
PREFILLED_SYRINGE | INTRAVENOUS | Status: DC | PRN
  Administered 2022-07-06 (×3): 80 ug via INTRAVENOUS

## 2022-07-06 MED ORDER — OXYCODONE-ACETAMINOPHEN 7.5-325 MG PO TABS: 1.0000 | ORAL_TABLET | Freq: Four times a day (QID) | ORAL | 0 refills | Status: DC | PRN

## 2022-07-06 MED ORDER — HYDROMORPHONE HCL 1 MG/ML IJ SOLN
INTRAMUSCULAR | Status: DC | PRN
  Administered 2022-07-06: 1 mg via INTRAVENOUS

## 2022-07-06 MED ORDER — ONDANSETRON HCL 4 MG/2ML IJ SOLN
INTRAMUSCULAR | Status: DC | PRN
  Administered 2022-07-06: 4 mg via INTRAVENOUS

## 2022-07-06 MED ORDER — HYDROMORPHONE HCL 2 MG/ML IJ SOLN
INTRAMUSCULAR | Status: AC
  Filled 2022-07-06: qty 1

## 2022-07-06 MED ORDER — KETOROLAC TROMETHAMINE 30 MG/ML IJ SOLN
INTRAMUSCULAR | Status: DC | PRN
  Administered 2022-07-06: 30 mg via INTRAVENOUS

## 2022-07-06 MED ORDER — BUPIVACAINE HCL (PF) 0.25 % IJ SOLN
INTRAMUSCULAR | Status: DC | PRN
  Administered 2022-07-06: 20 mL

## 2022-07-06 MED ORDER — DEXMEDETOMIDINE HCL IN NACL 80 MCG/20ML IV SOLN
INTRAVENOUS | Status: DC | PRN
  Administered 2022-07-06: 12 ug via BUCCAL
  Administered 2022-07-06: 8 ug via BUCCAL

## 2022-07-06 MED ORDER — PROPOFOL 10 MG/ML IV BOLUS
INTRAVENOUS | Status: AC
  Filled 2022-07-06: qty 20

## 2022-07-06 MED ORDER — LIDOCAINE 2% (20 MG/ML) 5 ML SYRINGE
INTRAMUSCULAR | Status: DC | PRN
  Administered 2022-07-06: 100 mg via INTRAVENOUS

## 2022-07-06 MED ORDER — SODIUM CHLORIDE 0.9 % IR SOLN
Status: DC | PRN
  Administered 2022-07-06: 1000 mL

## 2022-07-06 MED ORDER — FENTANYL CITRATE (PF) 250 MCG/5ML IJ SOLN
INTRAMUSCULAR | Status: DC | PRN
  Administered 2022-07-06: 100 ug via INTRAVENOUS
  Administered 2022-07-06: 75 ug via INTRAVENOUS
  Administered 2022-07-06: 50 ug via INTRAVENOUS
  Administered 2022-07-06: 25 ug via INTRAVENOUS

## 2022-07-06 MED ORDER — DEXAMETHASONE SODIUM PHOSPHATE 10 MG/ML IJ SOLN
INTRAMUSCULAR | Status: DC | PRN
  Administered 2022-07-06: 10 mg via INTRAVENOUS

## 2022-07-06 MED ORDER — CEFAZOLIN SODIUM-DEXTROSE 2-4 GM/100ML-% IV SOLN
2.0000 g | INTRAVENOUS | Status: AC
  Administered 2022-07-06: 2 g via INTRAVENOUS

## 2022-07-06 MED ORDER — CEFAZOLIN SODIUM-DEXTROSE 2-4 GM/100ML-% IV SOLN
INTRAVENOUS | Status: AC
  Filled 2022-07-06: qty 100

## 2022-07-06 MED ORDER — DIPHENHYDRAMINE HCL 50 MG/ML IJ SOLN
INTRAMUSCULAR | Status: DC | PRN
  Administered 2022-07-06: 12.5 mg via INTRAVENOUS

## 2022-07-06 SURGICAL SUPPLY — 54 items
ADH SKN CLS APL DERMABOND .7 (GAUZE/BANDAGES/DRESSINGS) ×2
CATH FOLEY 3WAY  5CC 16FR (CATHETERS) ×2
CATH FOLEY 3WAY 5CC 16FR (CATHETERS) ×2 IMPLANT
COVER BACK TABLE 60X90IN (DRAPES) ×2 IMPLANT
COVER TIP SHEARS 8 DVNC (MISCELLANEOUS) ×2 IMPLANT
COVER TIP SHEARS 8MM DA VINCI (MISCELLANEOUS) ×2
DEFOGGER SCOPE WARMER CLEARIFY (MISCELLANEOUS) ×2 IMPLANT
DERMABOND ADVANCED .7 DNX12 (GAUZE/BANDAGES/DRESSINGS) ×2 IMPLANT
DRAPE ARM DVNC X/XI (DISPOSABLE) ×8 IMPLANT
DRAPE COLUMN DVNC XI (DISPOSABLE) ×2 IMPLANT
DRAPE DA VINCI XI ARM (DISPOSABLE) ×8
DRAPE DA VINCI XI COLUMN (DISPOSABLE) ×2
DRAPE UTILITY XL STRL (DRAPES) ×2 IMPLANT
DRSG ADAPTIC 3X8 NADH LF (GAUZE/BANDAGES/DRESSINGS) IMPLANT
DURAPREP 26ML APPLICATOR (WOUND CARE) ×2 IMPLANT
ELECT REM PT RETURN 9FT ADLT (ELECTROSURGICAL) ×2
ELECTRODE REM PT RTRN 9FT ADLT (ELECTROSURGICAL) ×2 IMPLANT
GLOVE BIO SURGEON STRL SZ 6.5 (GLOVE) ×6 IMPLANT
GLOVE BIO SURGEON STRL SZ7 (GLOVE) IMPLANT
GLOVE BIOGEL PI IND STRL 6 (GLOVE) IMPLANT
GLOVE BIOGEL PI IND STRL 6.5 (GLOVE) IMPLANT
GLOVE BIOGEL PI IND STRL 7.0 (GLOVE) ×10 IMPLANT
GOWN STRL REUS W/TWL LRG LVL3 (GOWN DISPOSABLE) IMPLANT
HOLDER FOLEY CATH W/STRAP (MISCELLANEOUS) IMPLANT
IRRIG SUCT STRYKERFLOW 2 WTIP (MISCELLANEOUS) ×2
IRRIGATION SUCT STRKRFLW 2 WTP (MISCELLANEOUS) ×2 IMPLANT
IV NS 1000ML (IV SOLUTION) ×2
IV NS 1000ML BAXH (IV SOLUTION) IMPLANT
KIT TURNOVER CYSTO (KITS) ×2 IMPLANT
LEGGING LITHOTOMY PAIR STRL (DRAPES) ×2 IMPLANT
MANIPULATOR UTERINE 4.5 ZUMI (MISCELLANEOUS) IMPLANT
OBTURATOR OPTICAL STANDARD 8MM (TROCAR) ×2
OBTURATOR OPTICAL STND 8 DVNC (TROCAR) ×2
OBTURATOR OPTICALSTD 8 DVNC (TROCAR) ×2 IMPLANT
OCCLUDER COLPOPNEUMO (BALLOONS) ×2 IMPLANT
PACK ROBOT WH (CUSTOM PROCEDURE TRAY) ×2 IMPLANT
PACK TRENDGUARD 450 HYBRID PRO (MISCELLANEOUS) IMPLANT
PAD OB MATERNITY 4.3X12.25 (PERSONAL CARE ITEMS) ×2 IMPLANT
PAD PREP 24X48 CUFFED NSTRL (MISCELLANEOUS) ×2 IMPLANT
PROTECTOR NERVE ULNAR (MISCELLANEOUS) ×4 IMPLANT
SEAL CANN UNIV 5-8 DVNC XI (MISCELLANEOUS) ×6 IMPLANT
SEAL XI 5MM-8MM UNIVERSAL (MISCELLANEOUS) ×6
SET IRRIG Y TYPE TUR BLADDER L (SET/KITS/TRAYS/PACK) IMPLANT
SET TUBE SMOKE EVAC HIGH FLOW (TUBING) ×2 IMPLANT
SPIKE FLUID TRANSFER (MISCELLANEOUS) ×4 IMPLANT
SUT VIC AB 4-0 PS2 27 (SUTURE) ×6 IMPLANT
SUT VICRYL 0 UR6 27IN ABS (SUTURE) ×2 IMPLANT
SUT VLOC 180 0 9IN  GS21 (SUTURE) ×2
SUT VLOC 180 0 9IN GS21 (SUTURE) ×2 IMPLANT
SYS RETRIEVAL 5MM INZII UNIV (BASKET) ×2
SYSTEM RETRIEVL 5MM INZII UNIV (BASKET) IMPLANT
TRENDGUARD 450 HYBRID PRO PACK (MISCELLANEOUS) ×2
TROCAR PORT AIRSEAL 8X120 (TROCAR) ×2 IMPLANT
WATER STERILE IRR 500ML POUR (IV SOLUTION) IMPLANT

## 2022-07-06 NOTE — Discharge Instructions (Addendum)
  DISCHARGE INSTRUCTIONS: Laparoscopy  The following instructions have been prepared to help you care for yourself upon your return home today.  Wound care:  Do not get the incision wet for the first 24 hours. The incision should be kept clean and dry.  The Band-Aids or dressings may be removed the day after surgery.  Should the incision become sore, red, and swollen after the first week, check with your doctor.  Personal hygiene:  Shower the day after your procedure.  Activity and limitations:  Do NOT drive or operate any equipment today.  Do NOT lift anything more than 15 pounds for 2-3 weeks after surgery.  Do NOT rest in bed all day.  Walking is encouraged. Walk each day, starting slowly with 5-minute walks 3 or 4 times a day. Slowly increase the length of your walks.  Walk up and down stairs slowly.  Do NOT do strenuous activities, such as golfing, playing tennis, bowling, running, biking, weight lifting, gardening, mowing, or vacuuming for 2-4 weeks. Ask your doctor when it is okay to start.  Diet: Eat a light meal as desired this evening. You may resume your usual diet tomorrow.  Return to work: This is dependent on the type of work you do. For the most part you can return to a desk job within a week of surgery. If you are more active at work, please discuss this with your doctor.  What to expect after your surgery: You may have a slight burning sensation when you urinate on the first day. You may have a very small amount of blood in the urine. Expect to have a small amount of vaginal discharge/light bleeding for 1-2 weeks. It is not unusual to have abdominal soreness and bruising for up to 2 weeks. You may be tired and need more rest for about 1 week. You may experience shoulder pain for 24-72 hours. Lying flat in bed may relieve it.  Call your doctor for any of the following:  Develop a fever of 100.4 or greater  Inability to urinate 6 hours after discharge from hospital  Severe  pain not relieved by pain medications  Persistent of heavy bleeding at incision site  Redness or swelling around incision site after a week  Increasing nausea or vomiting      Post Anesthesia Home Care Instructions  Activity: Get plenty of rest for the remainder of the day. A responsible individual must stay with you for 24 hours following the procedure.  For the next 24 hours, DO NOT: -Drive a car -Paediatric nurse -Drink alcoholic beverages -Take any medication unless instructed by your physician -Make any legal decisions or sign important papers.  Meals: Start with liquid foods such as gelatin or soup. Progress to regular foods as tolerated. Avoid greasy, spicy, heavy foods. If nausea and/or vomiting occur, drink only clear liquids until the nausea and/or vomiting subsides. Call your physician if vomiting continues.  Special Instructions/Symptoms: Your throat may feel dry or sore from the anesthesia or the breathing tube placed in your throat during surgery. If this causes discomfort, gargle with warm salt water. The discomfort should disappear within 24 hours.  Do not take any Tylenol until after 6:30 pm today, if needed. Do not take any nonsteroidal anti inflammatories until after 6:30 pm today, if needed

## 2022-07-06 NOTE — Anesthesia Procedure Notes (Signed)
Procedure Name: Intubation Date/Time: 07/06/2022 10:58 AM  Performed by: Clearnce Sorrel, CRNAPre-anesthesia Checklist: Patient identified, Emergency Drugs available, Suction available and Patient being monitored Patient Re-evaluated:Patient Re-evaluated prior to induction Oxygen Delivery Method: Circle System Utilized Preoxygenation: Pre-oxygenation with 100% oxygen Induction Type: IV induction Ventilation: Mask ventilation without difficulty Laryngoscope Size: Mac and 3 Grade View: Grade I Tube type: Oral Tube size: 7.0 mm Number of attempts: 1 Airway Equipment and Method: Stylet and Oral airway Placement Confirmation: ETT inserted through vocal cords under direct vision, positive ETCO2 and breath sounds checked- equal and bilateral Secured at: 22 cm Tube secured with: Tape Dental Injury: Teeth and Oropharynx as per pre-operative assessment

## 2022-07-06 NOTE — Anesthesia Postprocedure Evaluation (Signed)
Anesthesia Post Note  Patient: Sue Jackson  Procedure(s) Performed: XI ROBOTIC ASSISTED LAPAROSCOPIC right OVARIAN CYSTECTOMY with peritoneal washings (Right: Abdomen) XI ROBOTIC ASSISTED LAPAROSCOPIC LYSIS OF ADHESION     Patient location during evaluation: PACU Anesthesia Type: General Level of consciousness: awake and alert Pain management: pain level controlled Vital Signs Assessment: post-procedure vital signs reviewed and stable Respiratory status: spontaneous breathing, nonlabored ventilation, respiratory function stable and patient connected to nasal cannula oxygen Cardiovascular status: blood pressure returned to baseline and stable Postop Assessment: no apparent nausea or vomiting Anesthetic complications: no  No notable events documented.  Last Vitals:  Vitals:   07/06/22 1345 07/06/22 1346  BP: (!) 106/54 (!) 106/54  Pulse: (!) 47 (!) 48  Resp: 10 (!) 7  Temp:  36.5 C  SpO2: 91% 92%    Last Pain:  Vitals:   07/06/22 1346  TempSrc:   PainSc: 0-No pain                 Barnet Glasgow

## 2022-07-06 NOTE — Op Note (Signed)
Operative Note  07/06/2022  12:41 PM  PATIENT:  Sue Jackson  41 y.o. female  PRE-OPERATIVE DIAGNOSIS:  right ovarian cyst/mass  POST-OPERATIVE DIAGNOSIS:  right ovarian Dermoid Cyst.  Left simple Ovarian Cyst.  Extensive Pelvic Adhesions.  PROCEDURE:  Procedure(s): XI ROBOTIC ASSISTED LAPAROSCOPIC right OVARIAN CYSTECTOMY with peritoneal washings; Left simple Ovarian Cyst drainage; EXTENSIVE LYSIS OF PELVIC ADHESIONS  SURGEON:  Surgeon(s): [name], MD  ANESTHESIA:   general  FINDINGS: Large uterine fibroids.  Bilateral Hydrosalpinges.  Left simple Ovarian Cyst.  Right Dermoid Ovarian Cyst.  Extensive pelvic adhesions  DESCRIPTION OF OPERATION: Under general anesthesia with endotracheal intubation the patient is in lithotomy position.  Deep Trendelenburg is tested and passed.  Timeout is done.  The patient is prepped with DuraPrep on the abdomen and with Betadine on the suprapubic, vulvar and vaginal areas.  The Foley is inserted in the bladder.  The ZUMI is inserted in the uterus for manipulation.  We go to the abdomen.  A supraumbilical infiltration of Marcaine one quarter plain is done.  A 1.5 cm incision is done with a scalpel at that level.  The aponeurosis is grasped with cokers and opened under direct vision with Mayo scissors.  The parietal peritoneum is grasped with hemostats and opened with Mayo scissors under direct vision.  A pursestring stitch of Vicryl 0 is done on the aponeurosis.  The Sheryle Hail is inserted under direct vision at this level and up pneumoperitoneum is created with CO2.  The camera is inserted in the ports and confirms a free abdominal wall where the other ports will be inserted.  The uterus is quite enlarged with fibroids.  Extensive adhesions are present in the pelvis.  Further description of the pelvic anatomy will be done after docking the robot.  A surgical pen is used to mark the skin at the port sites and infiltration of Marcaine is done  at each site.  A small incision is done with a scalpel at each site and all ports are inserted under direct vision.  2 robotic ports are inserted in line with the umbilicus on the right side and 1 robotic port in line with the umbilicus distally at the left side.  The assistant port is inserted just above the umbilical line medially on the left.  The robot is docked and targeting is done.  Robotic instruments are inserted under direct vision with the fenestrated clamp in the fourth arm, the scissors in the third arm, the camera in the second arm and the long bipolar in the first arm.  We go to the console.      Further inspection of the pelvic anatomy reveals extensive adhesions at the bilateral adnexa and in the posterior cul-de-sac.  Both tubes show hydrosalpinges with no visible fimbria.  The left ovary presents a small simple cyst.  The right ovary presents a large dermoid cyst.  We proceeded with extensive lysis of adhesions which take about 30 minutes.  The left simple ovarian cyst is drained and a large window is created with cauterization of the border for hemostasis.  The right ovary is completely adherent to the right ovarian fossa.  The tip of the scissors is used to cauterize a line opposite the infundibulopelvic ligament over about 5 to 6 cm.  This line of cauterized tissue is then opened with the scissors.  We used traction and countertraction for the right ovarian cystectomy.  At first it is done without rupture of the cyst, but eventually the  cyst ruptures confirming a dermoid cyst with fatty tissue as well as hair.  We used the Nezhat to suction the contents of the dermoid cyst as much as possible.  We continue with traction and countertraction to completely excise the cyst wall.  A small Endobag is inserted in the abdominopelvic cavity and the right ovarian cyst is easily put in the bag.  The Endobag with the cyst wall and contents are then removed through the [facility] port and sent to pathology.   We then thoroughly irrigated and suctioned the abdominopelvic cavities.  We completed the lysis of adhesions at the level of the right ovary to free it from the ovarian fossa and allow it to then retake its proper shape.  Hemostasis was adequate at all levels.  Pictures were taken before the lysis of adhesions and right ovarian cystectomy and left ovarian cyst drainage and again after those procedures.  All robotic instruments were removed under direct vision.  The robot was undocked.  We went by laparoscopy to further suction the pelvic cavity with less Trendelenburg.  We confirmed hemostasis once more.  Laparoscopic instruments were removed.  All ports were removed under direct vision.  The CO2 was evacuated.  The pursestring stitch was attached at the aponeurosis at the supraumbilical incision.  Hemostasis was adequate at all skin incisions.  The skin was closed with Vicryl 4-0 in a subcuticular stitch at all incisions.  Dermabond was added.  The ZUMI was removed from the uterus.  The Foley was removed.  The patient was brought to recovery room in good and stable status.  Extensive lysis of adhesions for at least 30 minutes.  ESTIMATED BLOOD LOSS: 25 cc   Intake/Output Summary (Last 24 hours) at 07/06/2022 1241 Last data filed at 07/06/2022 1211 Gross per 24 hour  Intake --  Output 150 ml  Net -150 ml     BLOOD ADMINISTERED:none   LOCAL MEDICATIONS USED:  MARCAINE     SPECIMEN:  Source of Specimen:  Right Ovarian Cyst wall (Dermoid)  DISPOSITION OF SPECIMEN:  PATHOLOGY  COUNTS:  YES  PLAN OF CARE: Transfer to PACU  Sue LavoieMD12:41 PM

## 2022-07-06 NOTE — Transfer of Care (Signed)
Immediate Anesthesia Transfer of Care Note  Patient: Sue Jackson  Procedure(s) Performed: XI ROBOTIC ASSISTED LAPAROSCOPIC right OVARIAN CYSTECTOMY with peritoneal washings (Right: Abdomen) XI ROBOTIC ASSISTED LAPAROSCOPIC LYSIS OF ADHESION  Patient Location: PACU  Anesthesia Type:General  Level of Consciousness: drowsy and patient cooperative  Airway & Oxygen Therapy: Patient Spontanous Breathing  Post-op Assessment: Report given to RN and Post -op Vital signs reviewed and stable  Post vital signs: Reviewed and stable  Last Vitals:  Vitals Value Taken Time  BP 109/85 07/06/22 1255  Temp 36.6 C 07/06/22 1255  Pulse 57 07/06/22 1256  Resp 17 07/06/22 1256  SpO2 93 % 07/06/22 1256  Vitals shown include unvalidated device data.  Last Pain:  Vitals:   07/06/22 0813  TempSrc: Oral      Patients Stated Pain Goal: 4 (16/07/37 1062)  Complications: No notable events documented.

## 2022-07-06 NOTE — H&P (Addendum)
Karlita Lichtman Romanski is an 41 y.o. female.G0 Accompanied by her same sex partner   RP: XI Robotic assisted Laparoscopic Right Ovarian Cystectomy   HPI: Seen on 06/16/22.  No change since then.  On 05/26/22 given EBX showing features of Chronic Endometritis, treated with Keflex. MRI of the Pelvis 05/26/22 showed a Rt Ovarian Cyst c/w Dermoid at 5.3 cm.  A Lt simple Cyst at 4.4 cm was present.  Uterine Fibroids.  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 FSH 9.0 and estradiol 74 on 04/15/22, non menopausal. Patient declines Tamoxifen.  At this time, desires preservation of Fertility potential, so declines BSO.    Pertinent Gynecological History: Menses: flow is moderate Contraception:  Same sex partner Blood transfusions: none Sexually transmitted diseases: no past history Previous GYN Procedures: None Last pap: normal   Menstrual History: No LMP recorded.    Past Medical History:  Diagnosis Date   Anemia 2019-2020   Anxiety    Arthritis    Bipolar disorder ([place]) 2013   Cancer ([place]) 2021   Depression    Family history of breast cancer    Headache 2021   Hypertension    Personal history of radiation therapy    Pre-diabetes     Past Surgical History:  Procedure Laterality Date   BREAST BIOPSY Right 04/09/2020   BREAST BIOPSY Left 04/24/2020   x2   BREAST LUMPECTOMY Right 06/12/2020   BREAST LUMPECTOMY WITH RADIOACTIVE SEED LOCALIZATION Right 06/12/2020   Procedure: RIGHT BREAST LUMPECTOMY WITH RADIOACTIVE SEED LOCALIZATION;  Surgeon: Coralie Keens, MD;  Location: [facility];  Service: General;  Laterality: Right;  LMA    Family History  Problem Relation Age of Onset   Arthritis Mother    Asthma Mother    Cirrhosis Father        d. 81   Depression Maternal Aunt    Hypertension Maternal Aunt    Breast cancer Maternal Aunt 51   Breast cancer Maternal Aunt 59   Breast cancer Paternal Aunt    Dementia Maternal Grandmother    Diabetes  Maternal Grandfather    Heart disease Maternal Grandfather    Hypertension Maternal Grandfather    Stroke Maternal Grandfather    Breast cancer Cousin        pat first cousin    Social History:  reports that she has quit smoking. Her smoking use included e-cigarettes. She has never used smokeless tobacco. She reports current alcohol use. She reports current drug use. Drug: Marijuana.  Allergies: No Known Allergies  Medications Prior to Admission  Medication Sig Dispense Refill Last Dose   amLODipine-benazepril (LOTREL) 5-10 MG capsule TAKE 1 CAPSULE BY MOUTH EVERY DAY 90 capsule 2 07/05/2022   cetirizine (ZYRTEC ALLERGY) 10 MG tablet Take 1 tablet (10 mg total) by mouth daily. 90 tablet 1 Past Month   EMGALITY 120 MG/ML SOAJ INJECT 120 MG INTO THE SKIN EVERY 28 (TWENTY-EIGHT) DAYS. 1 mL 5 Past Month   fluticasone (FLONASE) 50 MCG/ACT nasal spray Place 2 sprays into both nostrils daily. 16 g 6 Past Month   gabapentin (NEURONTIN) 100 MG capsule Take 1 capsule (100 mg total) by mouth 3 (three) times daily. 90 capsule 3 07/06/2022 at 0800   gabapentin (NEURONTIN) 400 MG capsule Take 1 capsule (400 mg total) by mouth 3 (three) times daily. 90 capsule 3 07/06/2022 at 0800   hydrOXYzine (ATARAX) 10 MG tablet TAKE 1 TABLET BY MOUTH THREE TIMES A DAY AS NEEDED 270  tablet 2 Past Week   ipratropium (ATROVENT) 0.03 % nasal spray Place 2 sprays into both nostrils every 12 (twelve) hours. 30 mL 0 Past Month   loperamide (IMODIUM A-D) 2 MG tablet Take 1 tablet (2 mg total) by mouth 4 (four) times daily as needed for diarrhea or loose stools. 30 tablet 0 Past Month   meclizine (ANTIVERT) 25 MG tablet Take 1 tablet (25 mg total) by mouth 3 (three) times daily as needed for dizziness. 30 tablet 1 Past Month   Phentermine-Topiramate (QSYMIA) 3.75-23 MG CP24 1 capsule po with breakfast daily 30 capsule 0 Past Week   propranolol (INDERAL) 40 MG tablet TAKE 1/2 TABLET BY MOUTH EVERY DAY 45 tablet 1 07/06/2022 at  0700   SUMAtriptan (IMITREX) 50 MG tablet Take 1 tablet (50 mg total) by mouth every 2 (two) hours as needed for migraine. May repeat in 2 hours if headache persists or recurs. 10 tablet 0 Past Month   Vitamin D, Ergocalciferol, (DRISDOL) 1.25 MG (50000 UNIT) CAPS capsule Take 1 capsule (50,000 Units total) by mouth every 7 (seven) days. 5 capsule 0 06/29/2022    REVIEW OF SYSTEMS: A ROS was performed and pertinent positives and negatives are included in the history. GENERAL: No fevers or chills. HEENT: No change in vision, no earache, sore throat or sinus congestion. NECK: No pain or stiffness. CARDIOVASCULAR: No chest pain or pressure. No palpitations. PULMONARY: No shortness of breath, cough or wheeze. GASTROINTESTINAL: No abdominal pain, nausea, vomiting or diarrhea, melena or bright red blood per rectum. GENITOURINARY: No urinary frequency, urgency, hesitancy or dysuria. MUSCULOSKELETAL: No joint or muscle pain, no back pain, no recent trauma. DERMATOLOGIC: No rash, no itching, no lesions. ENDOCRINE: No polyuria, polydipsia, no heat or cold intolerance. No recent change in weight. HEMATOLOGICAL: No anemia or easy bruising or bleeding. NEUROLOGIC: No headache, seizures, numbness, tingling or weakness. PSYCHIATRIC: No depression, no loss of interest in normal activity or change in sleep pattern.     Blood pressure 123/61, temperature 98 F (36.7 C), temperature source Oral, resp. rate 18, height 5' 5.75" (1.67 m), weight 101 kg, SpO2 99 %.  Physical Exam:  Results for orders placed or performed during the hospital encounter of 07/06/22 (from the past 24 hour(s))  Pregnancy, urine POC     Status: None   Collection Time: 07/06/22  8:29 AM  Result Value Ref Range   Preg Test, Ur NEGATIVE NEGATIVE  CBC     Status: Abnormal   Collection Time: 07/06/22  9:00 AM  Result Value Ref Range   WBC 6.7 4.0 - 10.5 K/uL   RBC 4.99 3.87 - 5.11 MIL/uL   Hemoglobin 13.2 12.0 - 15.0 g/dL   HCT 41.3 36.0 -  46.0 %   MCV 82.8 80.0 - 100.0 fL   MCH 26.5 26.0 - 34.0 pg   MCHC 32.0 30.0 - 36.0 g/dL   RDW 16.7 (H) 11.5 - 15.5 %   Platelets 293 150 - 400 K/uL   nRBC 0.0 0.0 - 0.2 %  I-STAT, chem 8     Status: Abnormal   Collection Time: 07/06/22  9:14 AM  Result Value Ref Range   Sodium 138 135 - 145 mmol/L   Potassium 3.7 3.5 - 5.1 mmol/L   Chloride 106 98 - 111 mmol/L   BUN 17 6 - 20 mg/dL   Creatinine, Ser 0.70 0.44 - 1.00 mg/dL   Glucose, Bld 118 (H) 70 - 99 mg/dL   Calcium, Ion 1.05 (L)  1.15 - 1.40 mmol/L   TCO2 22 22 - 32 mmol/L   Hemoglobin 14.3 12.0 - 15.0 g/dL   HCT 42.0 36.0 - 46.0 %    MRI Pelvis 05/26/22: Reproductive:   -- Uterus: Measures 13.9 x 8.2 by 11.6 cm (volume = 690 cm^3). Three intramural and submucosal fibroids are seen, which measure 6.8 cm, 5.6 cm, and 5.0 cm in maximum diameter. Cervix and vagina are normal in appearance.   -- Right ovary: A right adnexal mass with fat signal intensity is seen which measures 5.3 x 4.0 cm, consistent with a benign ovarian dermoid. A moderate right hydrosalpinx is also seen.   -- Left ovary: Moderate left hydrosalpinx is seen. A simple appearing left ovarian cyst is also seen measuring 4.4 x 2.5 cm.   Other: No peritoneal thickening or abnormal free fluid.     Assessment/Plan:  41 y.o. G0   1. Mass of right ovary Last visit 05/26/22 for EBX showing features of Chronic Endometritis, treated with Keflex. MRI of the Pelvis 05/26/22 showed a Rt Ovarian Cyst c/w Dermoid at 5.3 cm.  A Lt simple Cyst at 4.4 cm was present.  Uterine Fibroids.  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 FSH 9.0 and estradiol 74 on 04/15/22, non menopausal. Patient declines Tamoxifen.  At this time, desires preservation of Fertility potential, so declines BSO. Decision to proceed with an XI Robotic Rt Ovarian Cystectomy.  Surgery and risks thoroughly reviewed.  Patient would like to have 2 weeks off of work.   2. 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                         Patient was counseled as to the risk of surgery to include the following:   1. Infection (prohylactic antibiotics will be administered)   2. DVT/Pulmonary Embolism (prophylactic pneumo compression stockings will be used)   3.Trauma to internal organs requiring additional surgical procedure to repair any injury to internal organs requiring perhaps additional hospitalization days.   4.Hemmorhage requiring transfusion and blood products which carry risks such as anaphylactic reaction, hepatitis and AIDS   Patient had received literature information on the procedure scheduled and all her questions were answered and fully accepts all risk.   Marie-Lyne Antha Niday 07/06/2022, 10:05 AM

## 2022-07-07 ENCOUNTER — Encounter: Payer: Self-pay | Admitting: Obstetrics & Gynecology

## 2022-07-07 ENCOUNTER — Encounter (HOSPITAL_BASED_OUTPATIENT_CLINIC_OR_DEPARTMENT_OTHER): Payer: Self-pay | Admitting: Obstetrics & Gynecology

## 2022-07-07 NOTE — Telephone Encounter (Signed)
Patient has surgery yesterday. XI ROBOTIC ASSISTED LAPAROSCOPIC right OVARIAN CYSTECTOMY with peritoneal washings (Right: Abdomen)  XI ROBOTIC ASSISTED LAPAROSCOPIC LYSIS OF ADHESION

## 2022-07-08 ENCOUNTER — Telehealth (HOSPITAL_COMMUNITY): Payer: Self-pay | Admitting: Psychiatry

## 2022-07-08 LAB — SURGICAL PATHOLOGY

## 2022-07-08 LAB — CYTOLOGY - NON PAP

## 2022-07-08 NOTE — Telephone Encounter (Signed)
Spoke with patient. Patient is aware these forms has been processed. Patient states to disregard message  she already spoke with HR and it was approved. I will disregard message from My chart

## 2022-07-08 NOTE — Telephone Encounter (Signed)
Normal, should improve as she is passing gas.  Make sure she treats constipation as needed. Dr Dellis Filbert

## 2022-07-10 ENCOUNTER — Encounter: Payer: Self-pay | Admitting: Obstetrics & Gynecology

## 2022-07-12 ENCOUNTER — Telehealth: Payer: Self-pay

## 2022-07-12 NOTE — Telephone Encounter (Signed)
Paper work completed and faxed to patients employers. Patient notified.

## 2022-07-12 NOTE — Telephone Encounter (Signed)
---  caller states her covid test results say abnormal. stuffy nose, cough, hot, tickle in throat. went to UC 3 days ago. had surgery on the 28th of nov had cyst removal.  07/10/2022 8:08:09 AM [facility] [place], RN, [place] Bamberg  Comments User: Rozelle Logan, RN Date/Time Eilene Ghazi Time): 07/10/2022 8:09:27 AM advised to call back if no BM today, for vomiting, increasing surgical pain. says she is passing gas.  Care Advice Given Per Guideline * You should be able to treat this at home. CARE ADVICE given per COVID-19 - DIAGNOSED OR SUSPECTED (Adult) guideline. * You become worse CALL BACK IF: * Chest pain or difficulty breathing occurs

## 2022-07-12 NOTE — Telephone Encounter (Signed)
MyChart message to patient.   Routing to Dr. Ileene Musa.   Encounter closed.

## 2022-07-14 ENCOUNTER — Encounter (INDEPENDENT_AMBULATORY_CARE_PROVIDER_SITE_OTHER): Payer: Self-pay | Admitting: Family Medicine

## 2022-07-14 ENCOUNTER — Telehealth (INDEPENDENT_AMBULATORY_CARE_PROVIDER_SITE_OTHER): Payer: 59 | Admitting: Family Medicine

## 2022-07-14 DIAGNOSIS — R7303 Prediabetes: Secondary | ICD-10-CM | POA: Diagnosis not present

## 2022-07-14 DIAGNOSIS — E559 Vitamin D deficiency, unspecified: Secondary | ICD-10-CM

## 2022-07-14 DIAGNOSIS — Z6837 Body mass index (BMI) 37.0-37.9, adult: Secondary | ICD-10-CM | POA: Diagnosis not present

## 2022-07-14 DIAGNOSIS — E669 Obesity, unspecified: Secondary | ICD-10-CM | POA: Diagnosis not present

## 2022-07-14 MED ORDER — METFORMIN HCL 500 MG PO TABS: 500.0000 mg | ORAL_TABLET | Freq: Every day | ORAL | 0 refills | Status: DC

## 2022-07-14 MED ORDER — QSYMIA 3.75-23 MG PO CP24: ORAL_CAPSULE | ORAL | 0 refills | Status: DC

## 2022-07-14 MED ORDER — VITAMIN D (ERGOCALCIFEROL) 1.25 MG (50000 UNIT) PO CAPS: 50000.0000 [IU] | ORAL_CAPSULE | ORAL | 0 refills | Status: DC

## 2022-07-14 NOTE — Progress Notes (Signed)
TeleHealth Visit:  This visit was completed with telemedicine (audio/video) technology. Sue Jackson has verbally consented to this TeleHealth visit. The patient is located at home, the provider is located at home. The participants in this visit include the listed provider and patient. The visit was conducted today via MyChart video.  OBESITY Sue Jackson is here to discuss her progress with her obesity treatment plan along with follow-up of her obesity related diagnoses.   Today's visit was # 6 Starting weight: 229 lbs Starting date: 03/18/2022 Weight at last in office visit: 224 lbs on 06/16/22 Total weight loss: 5 lbs at last in office visit on 06/16/22. Today's reported weight:  No weight reported.   Nutrition Plan: the Category 3 Plan -50-60% adherence  Current exercise:  none  Interim History: Sue Jackson is recovering from laparoscopic right ovarian cystectomy on November 28.   She has not been able to exercise much but is doing some walking. She is skipping 1-2 meals per day.  She is trying to limit carbohydrates but says she had a potato yesterday.  Reports that dairy gives her vertigo. She denies intake of sugar sweetened beverages. Currently on Qsymia 3.75-23 mg dose.  Appetite well controlled.  The higher dose gave her anxiety.  Assessment/Plan:  1. Prediabetes Most recent A1c was 6.3 on 06/16/2022.  This is slightly improved from 6.4 in August. Medication(s): none Lab Results  Component Value Date   HGBA1C 6.3 (H) 06/16/2022   No results found for: "INSULIN"  Plan: New prescription-metformin 500 mg daily with breakfast.  May take 1/2 pill if GI upset. Discussed possible side effects. Discussed insulin resistance/prediabetes in depth.   2. Vitamin D Deficiency Vitamin D is not at goal of 50.  Vitamin D level has improved.  Most recent vitamin D on 06/16/2022 was 40. She is on weekly prescription Vitamin D 50,000 IU.  Lab Results  Component Value Date   VD25OH 30.8  03/18/2022    Plan: Refill prescription vitamin D 50,000 IU weekly.   3. Obesity: Current BMI 37 Refill Qsymia 3.75-23 mg daily.   I have consulted the [place] Controlled Substances Registry for this patient, and feel the risk/benefit ratio today is favorable for proceeding with this prescription for a controlled substance. No aberrancies noted.   Sue Jackson is currently in the action stage of change. As such, her goal is to continue with weight loss efforts.  She has agreed to the Category 3 Plan.   1. Handouts sent via MyChart-insulin resistance/prediabetes, protein equivalents. 2. Discussed she can substitute one of the protein equivalents for dairy in her plan. 3. Discussed the importance of eating regular meals for weight loss and maintaining metabolism. 4. She will work on avoiding high glycemic carbohydrates and eat foods listed on the plan.  Exercise goals: Increase walking as tolerated.  Behavioral modification strategies: increasing lean protein intake, decreasing simple carbohydrates, increasing vegetables, no skipping meals, meal planning and cooking strategies, better snacking choices, and planning for success.  Sue Jackson has agreed to follow-up with our clinic in 2 weeks.   No orders of the defined types were placed in this encounter.   Medications Discontinued During This Encounter  Medication Reason   Phentermine-Topiramate (QSYMIA) 3.75-23 MG CP24 Reorder   Vitamin D, Ergocalciferol, (DRISDOL) 1.25 MG (50000 UNIT) CAPS capsule Reorder     Meds ordered this encounter  Medications   Phentermine-Topiramate (QSYMIA) 3.75-23 MG CP24    Sig: 1 capsule po with breakfast daily    Dispense:  30 capsule  Refill:  0    Order Specific Question:   Supervising Provider    Answer:   Netty Starring   Vitamin D, Ergocalciferol, (DRISDOL) 1.25 MG (50000 UNIT) CAPS capsule    Sig: Take 1 capsule (50,000 Units total) by mouth every 7 (seven) days.    Dispense:  5 capsule     Refill:  0    Order Specific Question:   Supervising Provider    Answer:   Netty Starring   metFORMIN (GLUCOPHAGE) 500 MG tablet    Sig: Take 1 tablet (500 mg total) by mouth daily with breakfast.    Dispense:  30 tablet    Refill:  0    Order Specific Question:   Supervising Provider    Answer:   Dell Ponto [[id number]]      Objective:   VITALS: Per patient if applicable, see vitals. GENERAL: Alert and in no acute distress. CARDIOPULMONARY: No increased WOB. Speaking in clear sentences.  PSYCH: Pleasant and cooperative. Speech normal rate and rhythm. Affect is appropriate. Insight and judgement are appropriate. Attention is focused, linear, and appropriate.  NEURO: Oriented as arrived to appointment on time with no prompting.   Lab Results  Component Value Date   CREATININE 0.70 07/06/2022   BUN 17 07/06/2022   NA 138 07/06/2022   K 3.7 07/06/2022   CL 106 07/06/2022   CO2 22 03/18/2022   Lab Results  Component Value Date   ALT 22 03/18/2022   AST 5 03/18/2022   ALKPHOS 108 03/18/2022   BILITOT 0.4 03/18/2022   Lab Results  Component Value Date   HGBA1C 6.3 (H) 06/16/2022   HGBA1C 6.4 (H) 03/18/2022   HGBA1C 6.4 08/12/2021   HGBA1C 6.4 01/30/2020   HGBA1C 6.0 (A) 08/15/2018   No results found for: "INSULIN" Lab Results  Component Value Date   TSH 1.810 03/18/2022   Lab Results  Component Value Date   CHOL 154 03/18/2022   HDL 52 03/18/2022   LDLCALC 89 03/18/2022   TRIG 65 03/18/2022   CHOLHDL 3.0 03/18/2022   Lab Results  Component Value Date   WBC 6.7 07/06/2022   HGB 14.3 07/06/2022   HCT 42.0 07/06/2022   MCV 82.8 07/06/2022   PLT 293 07/06/2022   Lab Results  Component Value Date   IRON 45 03/18/2022   TIBC 396 03/18/2022   FERRITIN 29 03/18/2022   Lab Results  Component Value Date   VD25OH 30.8 03/18/2022    Attestation Statements:   Reviewed by clinician on day of visit: allergies, medications, problem list, medical history,  surgical history, family history, social history, and previous encounter notes.

## 2022-07-16 ENCOUNTER — Encounter: Payer: Self-pay | Admitting: Obstetrics & Gynecology

## 2022-07-16 ENCOUNTER — Encounter (INDEPENDENT_AMBULATORY_CARE_PROVIDER_SITE_OTHER): Payer: Self-pay

## 2022-07-16 NOTE — Telephone Encounter (Signed)
Routing message to Provider for approval of additional  FMLA days 3 to 4 per patient request.

## 2022-07-16 NOTE — Telephone Encounter (Signed)
Patient aware of approval. Copy of letter sent on My chart. As well as faxed to [phone]

## 2022-07-19 ENCOUNTER — Encounter (INDEPENDENT_AMBULATORY_CARE_PROVIDER_SITE_OTHER): Payer: Self-pay

## 2022-07-19 ENCOUNTER — Encounter: Payer: Self-pay | Admitting: Hematology

## 2022-07-20 ENCOUNTER — Other Ambulatory Visit: Payer: Self-pay

## 2022-07-20 DIAGNOSIS — D0511 Intraductal carcinoma in situ of right breast: Secondary | ICD-10-CM

## 2022-07-21 ENCOUNTER — Ambulatory Visit (INDEPENDENT_AMBULATORY_CARE_PROVIDER_SITE_OTHER): Payer: 59 | Admitting: Obstetrics & Gynecology

## 2022-07-21 ENCOUNTER — Encounter: Payer: Self-pay | Admitting: Obstetrics & Gynecology

## 2022-07-21 VITALS — BP 114/70 | HR 68

## 2022-07-21 DIAGNOSIS — Z09 Encounter for follow-up examination after completed treatment for conditions other than malignant neoplasm: Secondary | ICD-10-CM

## 2022-07-21 NOTE — Progress Notes (Signed)
    Kween Bacorn 1981-05-19 [id number]        41 y.o.  G0P0000   RP: Postop XI Robotic TLH/Rt Ovarian Cystectomy/Lt simple Ov Cyst drainage/Extensive Lysis of Adhesions/Peritoneal washings on 07/06/22.  HPI:  Postop XI Robotic TLH/Rt Ovarian Cystectomy/Lt simple Ov Cyst drainage/Extensive Lysis of Adhesions/Peritoneal washings on 07/06/22.  Incisions well closed, not tender, no redness.  No abdominopelvic pain. No vaginal bleeding. Urine/BMs normal.  No fever.  Had COVID postop, recovered.   OB History  Gravida Para Term Preterm AB Living  0 0 0 0 0 0  SAB IAB Ectopic Multiple Live Births  0 0 0 0 0    Past medical history,surgical history, problem list, medications, allergies, family history and social history were all reviewed and documented in the EPIC chart.   Directed ROS with pertinent positives and negatives documented in the history of present illness/assessment and plan.  Exam:  Vitals:   07/21/22 1146  BP: 114/70  Pulse: 68  SpO2: 99%   General appearance:  Normal  Abdomen: Normal, soft, NT, not distended.  Incisions well closed, no erythema, no induration, non-tender.  Gynecologic exam: Vulva normal.  Uterus AV, mobile, NT.  No adnexal mass felt.   Assessment/Plan:  41 y.o. G0  1. Status post gynecological surgery, follow-up exam  Postop XI Robotic TLH/Rt Ovarian Cystectomy/Lt simple Ov Cyst drainage/Extensive Lysis of Adhesions/Peritoneal washings on 07/06/22.  Incisions well closed, not tender, no redness.  No abdominopelvic pain. No vaginal bleeding. Urine/BMs normal.  No fever.  Had COVID postop, recovered. Very good postop healing with normal 2 weeks postop exam.  Precautions reviewed.  Patient informed of the surgical findings, especially the bilateral hydrosalpinges, adhesions and the Dermoid which was excised.  OR pictures reviewed with patient.  [name] MD, 12:00 PM 07/21/2022

## 2022-07-22 NOTE — Telephone Encounter (Signed)
Prior authorization done via covermy meds. Waiting on determination.  Sue Jackson Key: [id number] - PA Case ID: [id number] - Rx #: [id number]

## 2022-07-26 ENCOUNTER — Encounter (INDEPENDENT_AMBULATORY_CARE_PROVIDER_SITE_OTHER): Payer: Self-pay

## 2022-07-29 ENCOUNTER — Telehealth (INDEPENDENT_AMBULATORY_CARE_PROVIDER_SITE_OTHER): Payer: Self-pay | Admitting: *Deleted

## 2022-07-29 ENCOUNTER — Ambulatory Visit (INDEPENDENT_AMBULATORY_CARE_PROVIDER_SITE_OTHER): Payer: 59 | Admitting: Family Medicine

## 2022-07-29 ENCOUNTER — Encounter (HOSPITAL_COMMUNITY): Payer: Self-pay

## 2022-07-29 ENCOUNTER — Ambulatory Visit (HOSPITAL_COMMUNITY): Payer: 59 | Admitting: Clinical

## 2022-07-29 ENCOUNTER — Encounter (INDEPENDENT_AMBULATORY_CARE_PROVIDER_SITE_OTHER): Payer: Self-pay | Admitting: Family Medicine

## 2022-07-29 VITALS — BP 126/80 | HR 59 | Temp 97.5°F | Ht 67.0 in | Wt 218.0 lb

## 2022-07-29 DIAGNOSIS — E669 Obesity, unspecified: Secondary | ICD-10-CM

## 2022-07-29 DIAGNOSIS — E559 Vitamin D deficiency, unspecified: Secondary | ICD-10-CM | POA: Diagnosis not present

## 2022-07-29 DIAGNOSIS — R632 Polyphagia: Secondary | ICD-10-CM

## 2022-07-29 DIAGNOSIS — R7303 Prediabetes: Secondary | ICD-10-CM

## 2022-07-29 DIAGNOSIS — Z6834 Body mass index (BMI) 34.0-34.9, adult: Secondary | ICD-10-CM

## 2022-07-29 MED ORDER — METFORMIN HCL 500 MG PO TABS: 500.0000 mg | ORAL_TABLET | Freq: Every day | ORAL | 0 refills | Status: DC

## 2022-07-29 NOTE — Telephone Encounter (Signed)
I have re submitted information to patients insurance for Prior approval on her Qsymia. Waiting on determination.Key: [id number] - PA Case ID: [id number]

## 2022-07-30 ENCOUNTER — Telehealth: Payer: 59 | Admitting: Emergency Medicine

## 2022-07-30 DIAGNOSIS — M545 Low back pain, unspecified: Secondary | ICD-10-CM

## 2022-07-30 DIAGNOSIS — M549 Dorsalgia, unspecified: Secondary | ICD-10-CM

## 2022-07-30 MED ORDER — NAPROXEN 500 MG PO TABS: 500.0000 mg | ORAL_TABLET | Freq: Two times a day (BID) | ORAL | 0 refills | Status: AC

## 2022-07-30 MED ORDER — CYCLOBENZAPRINE HCL 5 MG PO TABS: 5.0000 mg | ORAL_TABLET | Freq: Three times a day (TID) | ORAL | 0 refills | Status: DC | PRN

## 2022-07-30 NOTE — Progress Notes (Signed)
Pt made video visit at same time as submitted evisit. Problem address by evisit except needs work note. I provided her with a work note.

## 2022-07-30 NOTE — Progress Notes (Signed)

## 2022-08-05 NOTE — Progress Notes (Signed)
Chief Complaint:   OBESITY Sue Jackson is here to discuss her progress with her obesity treatment plan along with follow-up of her obesity related diagnoses. Sue Jackson is on the Category 3 Plan and states she is following her eating plan approximately 50% of the time. Sue Jackson states she is exercising.  Today's visit was # 7  Starting weight: 229 lbs Starting date: 03/18/2022 Today's weight: 218 lbs Today's date: 07/29/2022 Total lbs lost to date: 11 lbs Total lbs lost since last in-office visit: 6 lbs  Interim History: Had Covid-19 and had a right ovarian cystectomy 07/06/2022.  She is healing up and is feeling better now.  Feels improvements in appetite and cravings on Qsymia 3.75/23 mg.  Denies irritability.  Has been making better food choices.   Subjective:   1. Pre-diabetes Last A1c was 6.3 on 06/16/2022.  Started metformin 500 mg every morning, started 2 weeks ago.  Patient denies GI side effects.  2. Polyphagia Improving on Qsymia 3.75/23 mg every morning without adverse side effects.  Blood pressure and heart rate are within normal limits.  Not at risk for pregnancy.  Has lost 5.6% TBW since starting Qsymia on 04/01/2022.  Patient did not tolerate higher doses.  3. Vitamin D deficiency 06/16/2022 vitamin D level 40.  Patient is on prescription vitamin D 50,000 IU weekly.  Energy level is improving.  Assessment/Plan:   1. Pre-diabetes Metformin 500 mg every morning with food refilled.  Patient plans to increase exercise.  Refill- metFORMIN (GLUCOPHAGE) 500 MG tablet; Take 1 tablet (500 mg total) by mouth daily with breakfast.  Dispense: 30 tablet; Refill: 0  2. Polyphagia Continue Qsymia 3.75/23 mg every morning.  3. Vitamin D deficiency Recheck level in 3 months.  Continue prescription vitamin D 50,000 IU weekly.  Refill- metFORMIN (GLUCOPHAGE) 500 MG tablet; Take 1 tablet (500 mg total) by mouth daily with breakfast.  Dispense: 30 tablet; Refill: 0  4. Obesity,current  BMI 34.3 Copy of category 3 meal plan given.   Sue Jackson is currently in the action stage of change. As such, her goal is to continue with weight loss efforts. She has agreed to the Category 3 Plan.   Exercise goals: All adults should avoid inactivity. Some physical activity is better than none, and adults who participate in any amount of physical activity gain some health benefits.  Behavioral modification strategies: increasing lean protein intake, increasing vegetables, increasing water intake, decreasing eating out, no skipping meals, meal planning and cooking strategies, keeping healthy foods in the home, holiday eating strategies , and avoiding temptations.  Sue Jackson has agreed to follow-up with our clinic in 4 weeks. She was informed of the importance of frequent follow-up visits to maximize her success with intensive lifestyle modifications for her multiple health conditions.   Objective:   Blood pressure 126/80, pulse (!) 59, temperature (!) 97.5 F (36.4 C), height '5\' 7"'$  (1.702 m), weight 218 lb (98.9 kg), SpO2 100 %. Body mass index is 34.14 kg/m.  General: Cooperative, alert, well developed, in no acute distress. HEENT: Conjunctivae and lids unremarkable. Cardiovascular: Regular rhythm.  Lungs: Normal work of breathing. Neurologic: No focal deficits.   Lab Results  Component Value Date   CREATININE 0.70 07/06/2022   BUN 17 07/06/2022   NA 138 07/06/2022   K 3.7 07/06/2022   CL 106 07/06/2022   CO2 22 03/18/2022   Lab Results  Component Value Date   ALT 22 03/18/2022   AST 5 03/18/2022   ALKPHOS 108 03/18/2022  BILITOT 0.4 03/18/2022   Lab Results  Component Value Date   HGBA1C 6.3 (H) 06/16/2022   HGBA1C 6.4 (H) 03/18/2022   HGBA1C 6.4 08/12/2021   HGBA1C 6.4 01/30/2020   HGBA1C 6.0 (A) 08/15/2018   No results found for: "INSULIN" Lab Results  Component Value Date   TSH 1.810 03/18/2022   Lab Results  Component Value Date   CHOL 154 03/18/2022   HDL  52 03/18/2022   LDLCALC 89 03/18/2022   TRIG 65 03/18/2022   CHOLHDL 3.0 03/18/2022   Lab Results  Component Value Date   VD25OH 30.8 03/18/2022   Lab Results  Component Value Date   WBC 6.7 07/06/2022   HGB 14.3 07/06/2022   HCT 42.0 07/06/2022   MCV 82.8 07/06/2022   PLT 293 07/06/2022   Lab Results  Component Value Date   IRON 45 03/18/2022   TIBC 396 03/18/2022   FERRITIN 29 03/18/2022   Attestation Statements:   Reviewed by clinician on day of visit: allergies, medications, problem list, medical history, surgical history, family history, social history, and previous encounter notes.  I, Davy Pique, am acting as Location manager for Loyal Gambler, DO.  I have reviewed the above documentation for accuracy and completeness, and I agree with the above. Dell Ponto, DO

## 2022-08-05 NOTE — Telephone Encounter (Addendum)
Prior authorization approved for patients Qsymia. Patient notified.  Sue Jackson (Key: [id number]) Approved on December 21 Your PA request has been approved. Authorization Expiration Date: 07/29/2023

## 2022-08-07 ENCOUNTER — Ambulatory Visit
Admission: RE | Admit: 2022-08-07 | Discharge: 2022-08-07 | Disposition: A | Discharge status: Home / Self Care | Payer: 59 | Source: Ambulatory Visit | Attending: Hematology | Admitting: Hematology

## 2022-08-07 DIAGNOSIS — D0511 Intraductal carcinoma in situ of right breast: Secondary | ICD-10-CM

## 2022-08-07 MED ORDER — GADOPICLENOL 0.5 MMOL/ML IV SOLN
10.0000 mL | Freq: Once | INTRAVENOUS | Status: AC | PRN
  Administered 2022-08-07: 10 mL via INTRAVENOUS

## 2022-08-10 ENCOUNTER — Telehealth: Payer: 59

## 2022-08-12 DIAGNOSIS — R1011 Right upper quadrant pain: Secondary | ICD-10-CM | POA: Insufficient documentation

## 2022-08-13 ENCOUNTER — Encounter: Payer: Self-pay | Admitting: Family Medicine

## 2022-08-13 ENCOUNTER — Telehealth (INDEPENDENT_AMBULATORY_CARE_PROVIDER_SITE_OTHER): Payer: 59 | Admitting: Family Medicine

## 2022-08-13 ENCOUNTER — Telehealth (HOSPITAL_COMMUNITY): Payer: 59 | Admitting: Psychiatry

## 2022-08-13 ENCOUNTER — Telehealth: Payer: 59 | Admitting: Family Medicine

## 2022-08-13 VITALS — Ht 67.0 in

## 2022-08-13 DIAGNOSIS — G43809 Other migraine, not intractable, without status migrainosus: Secondary | ICD-10-CM | POA: Diagnosis not present

## 2022-08-13 DIAGNOSIS — Z6835 Body mass index (BMI) 35.0-35.9, adult: Secondary | ICD-10-CM

## 2022-08-13 DIAGNOSIS — I10 Essential (primary) hypertension: Secondary | ICD-10-CM

## 2022-08-13 DIAGNOSIS — R519 Headache, unspecified: Secondary | ICD-10-CM

## 2022-08-13 DIAGNOSIS — F319 Bipolar disorder, unspecified: Secondary | ICD-10-CM

## 2022-08-13 DIAGNOSIS — F3181 Bipolar II disorder: Secondary | ICD-10-CM

## 2022-08-13 DIAGNOSIS — C50911 Malignant neoplasm of unspecified site of right female breast: Secondary | ICD-10-CM

## 2022-08-13 DIAGNOSIS — R1011 Right upper quadrant pain: Secondary | ICD-10-CM

## 2022-08-13 NOTE — Assessment & Plan Note (Signed)
She is not on pharmacologic treatment. Following with oncologist.

## 2022-08-13 NOTE — Progress Notes (Signed)
Virtual Visit via Video Note I connected with Sue Jackson on 08/14/22 by a video enabled telemedicine application and verified that I am speaking with the correct person using two identifiers. Location patient: home Location provider:work office Persons participating in the virtual visit: patient, provider  I discussed the limitations of evaluation and management by telemedicine and the availability of in person appointments. The patient expressed understanding and agreed to proceed.  Chief Complaint  Patient presents with   Form Completion   HPI: Sue Jackson is a 42 years old female with history of bipolar disorder, anxiety, vestibular migraine, prediabetes, and hypertension requesting a letter for accommodation at work. She got similarly letter last year in 11/2021. This is regarding her vertigo and blood pressure, both are aggravated working with the [employer].  She follows with neurologist and has seen ENT, Dx;ed with vertiginous migraines/vestibular migraines, which are triggered by the heat from the machines.  HTN on Amlodipine-Benazepril 5-10 mg daily. States that her blood pressure is well controlled when not exposed to the machine. Negative for chest pain, dyspnea, palpitation, claudication, focal weakness, or edema. Lab Results  Component Value Date   CREATININE 0.70 07/06/2022   BUN 17 07/06/2022   NA 138 07/06/2022   K 3.7 07/06/2022   CL 106 07/06/2022   CO2 22 03/18/2022   Additionally, she reports a long-standing history of RUQ abdominal pain, right under the ribcage, particularly after consuming spicy foods such as jalapeo. The pain is rated 6 out of 10 and is intermittent, lasting up to a day. Pain is not radiated. She experiences nausea and a feeling of warmth in the middle around affected area. She has noticed a decrease in pain since changing her diet and losing weight, but the pain still occurs with spicy foods. Requesting referral to GI. She also  reports bloating after eating even small amounts of food and flatulence.  Symptoms sometimes improve after passing gas or defecation.  Abdominal Pain This is a recurrent problem. The current episode started 1 to 4 weeks ago. The problem has been waxing and waning. The pain is located in the RUQ. The pain is at a severity of 6/10. The quality of the pain is colicky. Associated symptoms include flatus and nausea. Pertinent negatives include no fever, frequency, hematochezia, hematuria, melena or vomiting. The pain is aggravated by eating. The pain is relieved by Nothing. She has tried nothing for the symptoms.  Negative for fever, changes in bowel habits,or urinary symptoms.  Lastly, she is requesting an excuse note for today. States that she started with occipital headache around 8:30 am, she described it as her typical migraine. She thinks it was triggered by consuming sugar-free chocolate the night before. She experiences photophobia and phonophobia but denies associated visual changes,nausea,vomiting,or MS changes. She has not taken medication for pain. and noise, as well as nausea but no vomiting. She is on Emgality 120 mg q 28 days.  Right breast cancer, DCIS hight grade with ER+/PR+. Last followed up with oncologist in 08/2021. S/P lumpectomy on 08/13/19, radiation 07/23/20-2/322. Declined prophylactic tamoxifen.  Bipolar 2 disorder and anxiety: She follows with psychiatrist and counselor regularly. On Gabapentin and Hydroxyzine for anxiety.  ROS: See pertinent positives and negatives per HPI.  Past Medical History:  Diagnosis Date   Anemia 2019-2020   Anxiety    Arthritis    Bipolar disorder (HCC) 2013   Cancer [facility]) 2021   Depression    Family history of breast cancer    Headache  2021   Hypertension    Personal history of radiation therapy    Pre-diabetes    Past Surgical History:  Procedure Laterality Date   BREAST BIOPSY Right 04/09/2020   BREAST BIOPSY Left 04/24/2020    x2   BREAST LUMPECTOMY Right 06/12/2020   BREAST LUMPECTOMY WITH RADIOACTIVE SEED LOCALIZATION Right 06/12/2020   Procedure: RIGHT BREAST LUMPECTOMY WITH RADIOACTIVE SEED LOCALIZATION;  Surgeon: Abigail Miyamoto, MD;  Location: [facility] OR;  Service: General;  Laterality: Right;  LMA   ROBOTIC ASSISTED LAPAROSCOPIC LYSIS OF ADHESION  07/06/2022   Procedure: XI ROBOTIC ASSISTED LAPAROSCOPIC LYSIS OF ADHESION;  Surgeon: Genia Del, MD;  Location: [facility];  Service: Gynecology;;   ROBOTIC ASSISTED LAPAROSCOPIC OVARIAN CYSTECTOMY Right 07/06/2022   Procedure: XI ROBOTIC ASSISTED LAPAROSCOPIC right OVARIAN CYSTECTOMY with peritoneal washings;  Surgeon: Genia Del, MD;  Location: [facility] [place];  Service: Gynecology;  Laterality: Right;    Family History  Problem Relation Age of Onset   Arthritis Mother    Asthma Mother    Cirrhosis Father        d. 75   Depression Maternal Aunt    Hypertension Maternal Aunt    Breast cancer Maternal Aunt 77   Breast cancer Maternal Aunt 59   Breast cancer Paternal Aunt    Dementia Maternal Grandmother    Diabetes Maternal Grandfather    Heart disease Maternal Grandfather    Hypertension Maternal Grandfather    Stroke Maternal Grandfather    Breast cancer Cousin        pat first cousin    Social History   Socioeconomic History   Marital status: Married    Spouse name: Not on file   Number of children: 0   Years of education: Not on file   Highest education level: Not on file  Occupational History   Not on file  Tobacco Use   Smoking status: Former    Types: E-cigarettes   Smokeless tobacco: Never   Tobacco comments:    VAPES  Vaping Use   Vaping Use: Every day   Substances: CBD  Substance and Sexual Activity   Alcohol use: Yes    Comment: occasionally   Drug use: Yes    Types: Marijuana   Sexual activity: Yes    Partners: Female  Other Topics Concern   Not on file  Social History Narrative    Right handed   Social Determinants of Health   Financial Resource Strain: Low Risk  (04/16/2020)   Overall Financial Resource Strain (CARDIA)    Difficulty of Paying Living Expenses: Not very hard  Food Insecurity: No Food Insecurity (04/16/2020)   Hunger Vital Sign    Worried About Running Out of Food in the Last Year: Never true    Ran Out of Food in the Last Year: Never true  Transportation Needs: No Transportation Needs (04/16/2020)   PRAPARE - Administrator, Civil Service (Medical): No    Lack of Transportation (Non-Medical): No  Physical Activity: Not on file  Stress: Not on file  Social Connections: Not on file  Intimate Partner Violence: Not on file    Current Outpatient Medications:    amLODipine-benazepril (LOTREL) 5-10 MG capsule, TAKE 1 CAPSULE BY MOUTH EVERY DAY, Disp: 90 capsule, Rfl: 2   cetirizine (ZYRTEC ALLERGY) 10 MG tablet, Take 1 tablet (10 mg total) by mouth daily., Disp: 90 tablet, Rfl: 1   cyclobenzaprine (FLEXERIL) 5 MG tablet, Take 1 tablet (5 mg  total) by mouth 3 (three) times daily as needed for muscle spasms., Disp: 21 tablet, Rfl: 0   EMGALITY 120 MG/ML SOAJ, INJECT 120 MG INTO THE SKIN EVERY 28 (TWENTY-EIGHT) DAYS., Disp: 1 mL, Rfl: 5   fluticasone (FLONASE) 50 MCG/ACT nasal spray, Place 2 sprays into both nostrils daily., Disp: 16 g, Rfl: 6   gabapentin (NEURONTIN) 100 MG capsule, Take 1 capsule (100 mg total) by mouth 3 (three) times daily., Disp: 90 capsule, Rfl: 3   gabapentin (NEURONTIN) 400 MG capsule, Take 1 capsule (400 mg total) by mouth 3 (three) times daily., Disp: 90 capsule, Rfl: 3   hydrOXYzine (ATARAX) 10 MG tablet, TAKE 1 TABLET BY MOUTH THREE TIMES A DAY AS NEEDED, Disp: 270 tablet, Rfl: 2   ipratropium (ATROVENT) 0.03 % nasal spray, Place 2 sprays into both nostrils every 12 (twelve) hours., Disp: 30 mL, Rfl: 0   loperamide (IMODIUM A-D) 2 MG tablet, Take 1 tablet (2 mg total) by mouth 4 (four) times daily as needed for diarrhea  or loose stools., Disp: 30 tablet, Rfl: 0   meclizine (ANTIVERT) 25 MG tablet, Take 1 tablet (25 mg total) by mouth 3 (three) times daily as needed for dizziness., Disp: 30 tablet, Rfl: 1   metFORMIN (GLUCOPHAGE) 500 MG tablet, Take 1 tablet (500 mg total) by mouth daily with breakfast., Disp: 30 tablet, Rfl: 0   Phentermine-Topiramate (QSYMIA) 3.75-23 MG CP24, 1 capsule po with breakfast daily, Disp: 30 capsule, Rfl: 0   propranolol (INDERAL) 40 MG tablet, TAKE 1/2 TABLET BY MOUTH EVERY DAY, Disp: 45 tablet, Rfl: 1   SUMAtriptan (IMITREX) 50 MG tablet, Take 1 tablet (50 mg total) by mouth every 2 (two) hours as needed for migraine. May repeat in 2 hours if headache persists or recurs., Disp: 10 tablet, Rfl: 0   Vitamin D, Ergocalciferol, (DRISDOL) 1.25 MG (50000 UNIT) CAPS capsule, Take 1 capsule (50,000 Units total) by mouth every 7 (seven) days., Disp: 5 capsule, Rfl: 0  EXAM:  VITALS per patient if applicable:Ht 5\' 7"  (1.702 m)   BMI 34.14 kg/m  Wt Readings from Last 3 Encounters:  07/29/22 218 lb (98.9 kg)  07/06/22 222 lb 9.6 oz (101 kg)  06/16/22 227 lb (103 kg)   GENERAL: alert, oriented, appears well and in no acute distress  HEENT: atraumatic, conjunctiva clear, no obvious abnormalities on inspection of external.  NECK: normal movements of the head and neck  LUNGS: on inspection no signs of respiratory distress, breathing rate appears normal, no obvious gross SOB, gasping or wheezing  CV: no obvious cyanosis  MS: moves all visible extremities without noticeable abnormality  PSYCH/NEURO: pleasant and cooperative, no obvious depression or anxiety, speech and thought processing grossly intact  ASSESSMENT AND PLAN:  Discussed the following assessment and plan:  RUQ abdominal pain Assessment & Plan: Chronic. Recommend holding on GI referral for now until we rule out gallbladder disease. RUQ [place] ordered. Avoid trigger factors. If imaging is negative, GI referral can be  placed as requested by pt.  Orders: -     US ABDOMEN LIMITED RUQ (LIVER/GB); Future  Hypertension, essential, benign Assessment & Plan: Reporting BP at home as adequately controlled. Continue Amlodipine-Benazepril 5-10 mg daily and well as low salt diet. Continue monitoring BP regularly. F/U in 6 months.   Vestibular migraine Assessment & Plan: Reporting occipital headache today, which she described as her typical migraine. Note for work provided as requested. Continue Emgality as prescribed. Follows with neurologist.   Class 2  severe obesity with serious comorbidity and body mass index (BMI) of 35.0 to 35.9 in adult, unspecified obesity type [facility]) Assessment & Plan: Currently on Qsymia and Metformin. She is reporting gradual wt loss. She following with weight loss clinic.   Malignant neoplasm of right female breast, unspecified estrogen receptor status, unspecified site of breast [facility]) Assessment & Plan: S/P right lumpectomy and radiation therapy. She is not on prophylactic treatment. Last mammogram 08/07/22 Bi-Rads 2. Following with oncologist.   Bipolar 2 disorder [facility]) Assessment & Plan: She has not tolerated treatments in the past. Follows with psychiatrist and counselor regularly.   We discussed possible serious and likely etiologies, options for evaluation and workup, limitations of telemedicine visit vs in person visit, treatment, treatment risks and precautions. The patient was advised to call back or seek an in-person evaluation if the symptoms worsen or if the condition fails to improve as anticipated. I discussed the assessment and treatment plan with the patient. The patient was provided an opportunity to ask questions and all were answered. The patient agreed with the plan and demonstrated an understanding of the instructions.  Return in about 6 months (around 02/11/2023). Peytin Dechert G. [place], MD  [facility]. [place] office.

## 2022-08-13 NOTE — Assessment & Plan Note (Signed)
She is following with weight loss clinic and reporting being successful with weight loss.

## 2022-08-13 NOTE — Assessment & Plan Note (Signed)
She follows with psychiatry regularly.

## 2022-08-14 DIAGNOSIS — R519 Headache, unspecified: Secondary | ICD-10-CM | POA: Insufficient documentation

## 2022-08-14 NOTE — Assessment & Plan Note (Signed)
Reporting occipital headache today, which she described as her typical migraine. Note for work provided as requested. Continue Emgality as prescribed. Follows with neurologist.

## 2022-08-14 NOTE — Assessment & Plan Note (Signed)
Reporting BP at home as adequately controlled. Continue Amlodipine-Benazepril 5-10 mg daily and well as low salt diet. Continue monitoring BP regularly. F/U in 6 months.

## 2022-08-14 NOTE — Assessment & Plan Note (Signed)
She has not tolerated treatments in the past. Follows with psychiatrist and counselor regularly.

## 2022-08-14 NOTE — Assessment & Plan Note (Signed)
Chronic. Recommend holding on GI referral for now until we rule out gallbladder disease. RUQ [place] ordered. Avoid trigger factors. If imaging is negative, GI referral can be placed as requested by pt.

## 2022-08-15 ENCOUNTER — Encounter: Payer: Self-pay | Admitting: Hematology

## 2022-08-17 ENCOUNTER — Ambulatory Visit (HOSPITAL_COMMUNITY): Payer: 59 | Admitting: Clinical

## 2022-08-18 ENCOUNTER — Encounter: Payer: Self-pay | Admitting: Family Medicine

## 2022-08-18 ENCOUNTER — Encounter (HOSPITAL_COMMUNITY): Payer: Self-pay | Admitting: Psychiatry

## 2022-08-18 ENCOUNTER — Telehealth (INDEPENDENT_AMBULATORY_CARE_PROVIDER_SITE_OTHER): Payer: 59 | Admitting: Psychiatry

## 2022-08-18 ENCOUNTER — Ambulatory Visit
Admission: RE | Admit: 2022-08-18 | Discharge: 2022-08-18 | Disposition: A | Discharge status: Home / Self Care | Payer: 59 | Source: Ambulatory Visit | Attending: Family Medicine | Admitting: Family Medicine

## 2022-08-18 DIAGNOSIS — F419 Anxiety disorder, unspecified: Secondary | ICD-10-CM

## 2022-08-18 DIAGNOSIS — R1011 Right upper quadrant pain: Secondary | ICD-10-CM

## 2022-08-18 DIAGNOSIS — F3181 Bipolar II disorder: Secondary | ICD-10-CM | POA: Diagnosis not present

## 2022-08-18 MED ORDER — GABAPENTIN 400 MG PO CAPS: 400.0000 mg | ORAL_CAPSULE | Freq: Three times a day (TID) | ORAL | 3 refills | Status: DC

## 2022-08-18 MED ORDER — GABAPENTIN 100 MG PO CAPS: 100.0000 mg | ORAL_CAPSULE | Freq: Three times a day (TID) | ORAL | 3 refills | Status: DC

## 2022-08-18 MED ORDER — HYDROXYZINE HCL 10 MG PO TABS: 10.0000 mg | ORAL_TABLET | Freq: Three times a day (TID) | ORAL | 3 refills | Status: DC | PRN

## 2022-08-18 NOTE — Progress Notes (Signed)
[place] MD/PA/NP OP Progress Note Virtual Visit via Telephone Note  I connected with Sue Jackson on 08/18/22 at  1:00 PM EST by telephone and verified that I am speaking with the correct person using two identifiers.  Location: Patient: home Provider: Clinic   I discussed the limitations, risks, security and privacy concerns of performing an evaluation and management service by telephone and the availability of in person appointments. I also discussed with the patient that there may be a patient responsible charge related to this service. The patient expressed understanding and agreed to proceed.   I provided 30 minutes of non-face-to-face time during this encounter.    08/18/2022 1:34 PM Sue Jackson  MRN:  [id number]  Chief Complaint:  "I have up and down emotions"  HPI: 42 year old female seen today for follow-up psychiatric evaluation. She has a psychiatric history of bipolar disorder, SI, depression, and anxiety.  She is currently managed on Gabapentin 500 mg three times daily. She notes her medication is somewhat effective in managing her psychiatric conditions.   Today patient was well groomed, pleasant, cooperative, and engaged in conversation. She informed Probation officer that her emotions are up and down.  She reports that she feels that being on so many pills affect her mood. At times she notes that she feels overwhelmed with life and caring for others. 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  Patient informed Probation officer that she desires to be alone without responsibility.  At times she notes that this causes conflict with her wife.  Patient notes that the above exacerbates her anxiety and depression. Today provider conducted a GAD 7 and patient scored a 21, at her last visit she scored a 16. Provider also conducted a PHQ 9 and patient scored a 19, at her last visit she scored 13. She endorses adequate appetite and notes that she  continues to be followed by weight management.Today she endorses passive SI but denies wanting to harm herself.  She denies SI/HI/VA or paranoia.  Patient endorses symptoms of hypomania such as distractibility, irritability, and fluctuations in mood.  She informed Probation officer that during the holiday break she became really irritable with her in-laws neighbors.  Patient notes that she got into an argument with this person about fireworks.  Patient notes that she has been having stomach pains.  She notes that she is followed by primary care for this issue.  Today provider offered a mood stabilizer, antidepressant, or antipsychotic to help manage depressive episode however patient was not agreeable.  She informed Probation officer that she has not started hydroxyzine but was agreeable to starting hydroxyzine 10 mg 3 times daily to help manage anxiety.  She will continue gabapentin as prescribed.  No other concerns at this time.         Visit Diagnosis:    ICD-10-CM   1. Anxiety  F41.9 gabapentin (NEURONTIN) 100 MG capsule    gabapentin (NEURONTIN) 400 MG capsule    hydrOXYzine (ATARAX) 10 MG tablet    2. Bipolar 2 disorder (HCC)  F31.81 gabapentin (NEURONTIN) 100 MG capsule    gabapentin (NEURONTIN) 400 MG capsule      Past Psychiatric History: : Bipolar depression, anxiety.  History of 1 prior psychiatry hospitaliation in 2013 after overdosing on Effexor. Past Medical History:  Past Medical History:  Diagnosis Date   Anemia 2019-2020   Anxiety    Arthritis    Bipolar disorder ([place]) 2013   Cancer ([place]) 2021  Depression    Family history of breast cancer    Headache 2021   Hypertension    Personal history of radiation therapy    Pre-diabetes     Past Surgical History:  Procedure Laterality Date   BREAST BIOPSY Right 04/09/2020   BREAST BIOPSY Left 04/24/2020   x2   BREAST LUMPECTOMY Right 06/12/2020   BREAST LUMPECTOMY WITH RADIOACTIVE SEED LOCALIZATION Right 06/12/2020   Procedure: RIGHT  BREAST LUMPECTOMY WITH RADIOACTIVE SEED LOCALIZATION;  Surgeon: Coralie Keens, MD;  Location: [facility];  Service: General;  Laterality: Right;  LMA   ROBOTIC ASSISTED LAPAROSCOPIC LYSIS OF ADHESION  07/06/2022   Procedure: XI ROBOTIC ASSISTED LAPAROSCOPIC LYSIS OF ADHESION;  Surgeon: [name], MD;  Location: [place];  Service: Gynecology;;   ROBOTIC ASSISTED LAPAROSCOPIC OVARIAN CYSTECTOMY Right 07/06/2022   Procedure: XI ROBOTIC ASSISTED LAPAROSCOPIC right OVARIAN CYSTECTOMY with peritoneal washings;  Surgeon: [name], MD;  Location: [facility];  Service: Gynecology;  Laterality: Right;    Family Psychiatric History: Maternal aunt- depression  Family History:  Family History  Problem Relation Age of Onset   Arthritis Mother    Asthma Mother    Cirrhosis Father        d. 84   Depression Maternal Aunt    Hypertension Maternal Aunt    Breast cancer Maternal Aunt 51   Breast cancer Maternal Aunt 59   Breast cancer Paternal Aunt    Dementia Maternal Grandmother    Diabetes Maternal Grandfather    Heart disease Maternal Grandfather    Hypertension Maternal Grandfather    Stroke Maternal Grandfather    Breast cancer Cousin        pat first cousin    Social History:  Social History   Socioeconomic History   Marital status: Married    Spouse name: Not on file   Number of children: 0   Years of education: Not on file   Highest education level: Not on file  Occupational History   Not on file  Tobacco Use   Smoking status: Former    Types: E-cigarettes   Smokeless tobacco: Never   Tobacco comments:    VAPES  Vaping Use   Vaping Use: Every day   Substances: CBD  Substance and Sexual Activity   Alcohol use: Yes    Comment: occasionally   Drug use: Yes    Types: Marijuana   Sexual activity: Yes    Partners: Female  Other Topics Concern   Not on file  Social History Narrative   Right handed   Social Determinants  of Health   Financial Resource Strain: Low Risk  (04/16/2020)   Overall Financial Resource Strain (CARDIA)    Difficulty of Paying Living Expenses: Not very hard  Food Insecurity: No Food Insecurity (04/16/2020)   Hunger Vital Sign    Worried About Running Out of Food in the Last Year: Never true    [place] in the Last Year: Never true  Transportation Needs: No Transportation Needs (04/16/2020)   PRAPARE - Hydrologist (Medical): No    Lack of Transportation (Non-Medical): No  Physical Activity: Not on file  Stress: Not on file  Social Connections: Not on file    Allergies: No Known Allergies  Metabolic Disorder Labs: Lab Results  Component Value Date   HGBA1C 6.3 (H) 06/16/2022   No results found for: "PROLACTIN" Lab Results  Component Value Date   CHOL 154  03/18/2022   TRIG 65 03/18/2022   HDL 52 03/18/2022   CHOLHDL 3.0 03/18/2022   VLDL 12.4 01/30/2020   LDLCALC 89 03/18/2022   LDLCALC 76 01/30/2020   Lab Results  Component Value Date   TSH 1.810 03/18/2022   TSH 2.51 11/21/2018    Therapeutic Level Labs: No results found for: "LITHIUM" No results found for: "VALPROATE" No results found for: "CBMZ"  Current Medications: Current Outpatient Medications  Medication Sig Dispense Refill   amLODipine-benazepril (LOTREL) 5-10 MG capsule TAKE 1 CAPSULE BY MOUTH EVERY DAY 90 capsule 2   cetirizine (ZYRTEC ALLERGY) 10 MG tablet Take 1 tablet (10 mg total) by mouth daily. 90 tablet 1   cyclobenzaprine (FLEXERIL) 5 MG tablet Take 1 tablet (5 mg total) by mouth 3 (three) times daily as needed for muscle spasms. 21 tablet 0   EMGALITY 120 MG/ML SOAJ INJECT 120 MG INTO THE SKIN EVERY 28 (TWENTY-EIGHT) DAYS. 1 mL 5   fluticasone (FLONASE) 50 MCG/ACT nasal spray Place 2 sprays into both nostrils daily. 16 g 6   gabapentin (NEURONTIN) 100 MG capsule Take 1 capsule (100 mg total) by mouth 3 (three) times daily. 90 capsule 3   gabapentin  (NEURONTIN) 400 MG capsule Take 1 capsule (400 mg total) by mouth 3 (three) times daily. 90 capsule 3   hydrOXYzine (ATARAX) 10 MG tablet Take 1 tablet (10 mg total) by mouth 3 (three) times daily as needed. 90 tablet 3   ipratropium (ATROVENT) 0.03 % nasal spray Place 2 sprays into both nostrils every 12 (twelve) hours. 30 mL 0   loperamide (IMODIUM A-D) 2 MG tablet Take 1 tablet (2 mg total) by mouth 4 (four) times daily as needed for diarrhea or loose stools. 30 tablet 0   meclizine (ANTIVERT) 25 MG tablet Take 1 tablet (25 mg total) by mouth 3 (three) times daily as needed for dizziness. 30 tablet 1   metFORMIN (GLUCOPHAGE) 500 MG tablet Take 1 tablet (500 mg total) by mouth daily with breakfast. 30 tablet 0   Phentermine-Topiramate (QSYMIA) 3.75-23 MG CP24 1 capsule po with breakfast daily 30 capsule 0   propranolol (INDERAL) 40 MG tablet TAKE 1/2 TABLET BY MOUTH EVERY DAY 45 tablet 1   SUMAtriptan (IMITREX) 50 MG tablet Take 1 tablet (50 mg total) by mouth every 2 (two) hours as needed for migraine. May repeat in 2 hours if headache persists or recurs. 10 tablet 0   Vitamin D, Ergocalciferol, (DRISDOL) 1.25 MG (50000 UNIT) CAPS capsule Take 1 capsule (50,000 Units total) by mouth every 7 (seven) days. 5 capsule 0   No current facility-administered medications for this visit.     Musculoskeletal: Strength & Muscle Tone: within normal limits and Telehealth visit Gait & Station: normal, Telehealth visit Patient leans: N/A  Psychiatric Specialty Exam: Review of Systems  There were no vitals taken for this visit.There is no height or weight on file to calculate BMI.  General Appearance: Well groomed  Eye Contact:  Good  Speech:  Clear and Coherent and Normal Rate  Volume:  Normal  Mood:  Anxious and Depressed  Affect:  Appropriate and Congruent  Thought Process:  Coherent, Goal Directed, and Linear  Orientation:  Full (Time, Place, and Person)  Thought Content: WDL and Logical    Suicidal Thoughts:  Yes.  without intent/plan  Homicidal Thoughts:  No  Memory:  Immediate;   Good Recent;   Good Remote;   Good  Judgement:  Good  Insight:  Good  Psychomotor Activity:  Normal  Concentration:  Concentration: Good and Attention Span: Good  Recall:  Good  Fund of Knowledge: Good  Language: Good  Akathisia:  No  Handed:  Right  AIMS (if indicated): not done  Assets:  Communication Skills Desire for Improvement Financial Resources/Insurance Housing Intimacy Physical Health Social Support  ADL's:  Intact  Cognition: WNL  Sleep:  Good   Screenings: GAD-7    Flowsheet Row Video Visit from 08/18/2022 in [facility] Video Visit from 05/06/2022 in [facility] Video Visit from 02/02/2022 in [facility] Video Visit from 10/01/2021 in [facility] Counselor from 09/02/2021 in [facility]  Total GAD-7 Score '21 16 11 9 14      '$ PHQ2-9    Flowsheet Row Video Visit from 08/18/2022 in [facility] Video Visit from 05/06/2022 in [facility] Office Visit from 03/18/2022 in [facility] Office Visit from 03/17/2022 in [facility] Video Visit from 02/02/2022 in [facility]  PHQ-2 Total Score '6 5 6 5 '$ 0  PHQ-9 Total Score '19 13 13 17 4      '$ Flowsheet Row Video Visit from 08/18/2022 in [facility] Admission (Discharged) from 07/06/2022 in [facility] Video Visit from 05/06/2022 in [facility] Error: Q7 should not be populated when Q6 is No No Risk (P)  Error: Q7 should not be populated when Q6 is No        Assessment and Plan: Patient endorses increased anxiety, depression, hypomania and SI. Provider offered a mood stabilizer, antidepressant, or  antipsychotic to help manage depressive episode however patient was not agreeable.  He informed Probation officer that she had not started hydroxyzine but was agreeable to starting hydroxyzine 10 mg 3 times daily to help manage anxiety.  She will continue gabapentin as prescribed   1. Anxiety  Continue- gabapentin (NEURONTIN) 100 MG capsule; Take 1 capsule (100 mg total) by mouth 3 (three) times daily.  Dispense: 90 capsule; Refill: 3 Continue- gabapentin (NEURONTIN) 400 MG capsule; Take 1 capsule (400 mg total) by mouth 3 (three) times daily.  Dispense: 90 capsule; Refill: 3 Restart- hydrOXYzine (ATARAX) 10 MG tablet; Take 1 tablet (10 mg total) by mouth 3 (three) times daily as needed.  Dispense: 90 tablet; Refill: 3 - Ambulatory referral to Social Work  2. Bipolar 2 disorder (HCC)  Continue- gabapentin (NEURONTIN) 100 MG capsule; Take 1 capsule (100 mg total) by mouth 3 (three) times daily.  Dispense: 90 capsule; Refill: 3 Continue- gabapentin (NEURONTIN) 400 MG capsule; Take 1 capsule (400 mg total) by mouth 3 (three) times daily.  Dispense: 90 capsule; Refill: 3 - Ambulatory referral to Social Work   Follow-up in 3 months Follow-up with therapy  Salley Slaughter, NP 08/18/2022, 1:34 PM

## 2022-08-19 ENCOUNTER — Telehealth (INDEPENDENT_AMBULATORY_CARE_PROVIDER_SITE_OTHER): Payer: 59 | Admitting: Family Medicine

## 2022-08-19 DIAGNOSIS — Z6835 Body mass index (BMI) 35.0-35.9, adult: Secondary | ICD-10-CM

## 2022-08-19 NOTE — Progress Notes (Signed)
Visit not completed.  Patient needed refill of controlled substance which cannot be done through virtual visit.  Patient rescheduled for in office appointment next week.

## 2022-08-19 NOTE — Addendum Note (Signed)
Addended by: Georgianne Fick on: 08/19/2022 02:12 PM   Modules accepted: Level of Service

## 2022-08-20 ENCOUNTER — Encounter: Payer: Self-pay | Admitting: Family Medicine

## 2022-08-20 DIAGNOSIS — R1011 Right upper quadrant pain: Secondary | ICD-10-CM

## 2022-08-20 NOTE — Telephone Encounter (Signed)
See result note.  

## 2022-08-25 ENCOUNTER — Ambulatory Visit (INDEPENDENT_AMBULATORY_CARE_PROVIDER_SITE_OTHER): Payer: 59 | Admitting: Physician Assistant

## 2022-08-25 ENCOUNTER — Encounter (INDEPENDENT_AMBULATORY_CARE_PROVIDER_SITE_OTHER): Payer: Self-pay | Admitting: Physician Assistant

## 2022-08-25 VITALS — BP 124/85 | HR 52 | Temp 98.1°F | Ht 67.0 in | Wt 215.0 lb

## 2022-08-25 DIAGNOSIS — Z6833 Body mass index (BMI) 33.0-33.9, adult: Secondary | ICD-10-CM

## 2022-08-25 DIAGNOSIS — K760 Fatty (change of) liver, not elsewhere classified: Secondary | ICD-10-CM | POA: Diagnosis not present

## 2022-08-25 DIAGNOSIS — E559 Vitamin D deficiency, unspecified: Secondary | ICD-10-CM

## 2022-08-25 DIAGNOSIS — R632 Polyphagia: Secondary | ICD-10-CM

## 2022-08-25 DIAGNOSIS — E669 Obesity, unspecified: Secondary | ICD-10-CM

## 2022-08-25 DIAGNOSIS — R7303 Prediabetes: Secondary | ICD-10-CM

## 2022-08-25 MED ORDER — QSYMIA 3.75-23 MG PO CP24: ORAL_CAPSULE | ORAL | 0 refills | Status: DC

## 2022-08-25 MED ORDER — VITAMIN D (ERGOCALCIFEROL) 1.25 MG (50000 UNIT) PO CAPS: 50000.0000 [IU] | ORAL_CAPSULE | ORAL | 0 refills | Status: DC

## 2022-08-27 ENCOUNTER — Telehealth: Payer: 59 | Admitting: Family Medicine

## 2022-08-27 DIAGNOSIS — N921 Excessive and frequent menstruation with irregular cycle: Secondary | ICD-10-CM

## 2022-08-27 NOTE — Progress Notes (Signed)
Virtual Visit Consent   Sue Jackson, you are scheduled for a virtual visit with a [facility] provider today. Just as with appointments in the office, your consent must be obtained to participate. Your consent will be active for this visit and any virtual visit you may have with one of our providers in the next 365 days. If you have a MyChart account, a copy of this consent can be sent to you electronically.  As this is a virtual visit, video technology does not allow for your provider to perform a traditional examination. This may limit your provider's ability to fully assess your condition. If your provider identifies any concerns that need to be evaluated in person or the need to arrange testing (such as labs, EKG, etc.), we will make arrangements to do so. Although advances in technology are sophisticated, we cannot ensure that it will always work on either your end or our end. If the connection with a video visit is poor, the visit may have to be switched to a telephone visit. With either a video or telephone visit, we are not always able to ensure that we have a secure connection.  By engaging in this virtual visit, you consent to the provision of healthcare and authorize for your insurance to be billed (if applicable) for the services provided during this visit. Depending on your insurance coverage, you may receive a charge related to this service.  I need to obtain your verbal consent now. Are you willing to proceed with your visit today? Sue Jackson has provided verbal consent on 08/27/2022 for a virtual visit (video or telephone). Sue Mayo, NP  Date: 08/27/2022 1:34 PM  Virtual Visit via Video Note   I, Sue Jackson, connected with  Sue Jackson  ([id number], [birth year]) on 08/27/22 at  1:30 PM EST by a video-enabled telemedicine application and verified that I am speaking with the correct person using two identifiers.  Location: Patient: Virtual  Visit Location Patient: Home Provider: Virtual Visit Location Provider: Home Office   I discussed the limitations of evaluation and management by telemedicine and the availability of in person appointments. The patient expressed understanding and agreed to proceed.    History of Present Illness: Sue Jackson is a 42 y.o. who identifies as a female who was assigned female at birth, and is being seen today for cycle changes. Reports she feels like her cycle should be starting or should have come by now and it has not. She is unsure of normal timing. She is also going to a fertility clinic to look at possibly starting a family. She is tearful due to several health changes over the last several months. She has known fibroids, enlarged uterus and ovarian changes- as previously documented in chart. She is in pain and would like a work note so that she can rest.     Problems:  Patient Active Problem List   Diagnosis Date Noted   Headache, unspecified headache type 08/14/2022   Vestibular migraine 08/13/2022   RUQ abdominal pain 08/12/2022   Polyphagia 07/29/2022   Postoperative state 07/06/2022   Fibroids 05/19/2022   Class 2 severe obesity with serious comorbidity and body mass index (BMI) of 35.0 to 35.9 in adult [facility]) 04/20/2022   Uterine leiomyoma 04/20/2022   Pre-diabetes 04/20/2022   Vitamin D deficiency 04/01/2022   Insulin resistance 04/01/2022   Depression 04/01/2022   Malignant neoplasm of right female breast, unspecified estrogen receptor status, unspecified site of breast (  [place]) 08/13/2021   Genetic testing 08/20/2020   Family history of breast cancer    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   Bipolar 2 disorder ([place]) 11/16/2019   Prediabetes 08/15/2018   Anxiety 02/06/2018   Low back pain radiating to right lower extremity 07/06/2017   Upper back pain, chronic 07/06/2017   Hypertension, essential, benign 03/11/2017   Severe obesity (BMI  35.0-35.9 with comorbidity) ([place]) 03/11/2017    Allergies: No Known Allergies Medications:  Current Outpatient Medications:    amLODipine-benazepril (LOTREL) 5-10 MG capsule, TAKE 1 CAPSULE BY MOUTH EVERY DAY, Disp: 90 capsule, Rfl: 2   cetirizine (ZYRTEC ALLERGY) 10 MG tablet, Take 1 tablet (10 mg total) by mouth daily., Disp: 90 tablet, Rfl: 1   cyclobenzaprine (FLEXERIL) 5 MG tablet, Take 1 tablet (5 mg total) by mouth 3 (three) times daily as needed for muscle spasms., Disp: 21 tablet, Rfl: 0   EMGALITY 120 MG/ML SOAJ, INJECT 120 MG INTO THE SKIN EVERY 28 (TWENTY-EIGHT) DAYS., Disp: 1 mL, Rfl: 5   fluticasone (FLONASE) 50 MCG/ACT nasal spray, Place 2 sprays into both nostrils daily., Disp: 16 g, Rfl: 6   gabapentin (NEURONTIN) 100 MG capsule, Take 1 capsule (100 mg total) by mouth 3 (three) times daily., Disp: 90 capsule, Rfl: 3   gabapentin (NEURONTIN) 400 MG capsule, Take 1 capsule (400 mg total) by mouth 3 (three) times daily., Disp: 90 capsule, Rfl: 3   hydrOXYzine (ATARAX) 10 MG tablet, Take 1 tablet (10 mg total) by mouth 3 (three) times daily as needed., Disp: 90 tablet, Rfl: 3   ipratropium (ATROVENT) 0.03 % nasal spray, Place 2 sprays into both nostrils every 12 (twelve) hours., Disp: 30 mL, Rfl: 0   loperamide (IMODIUM A-D) 2 MG tablet, Take 1 tablet (2 mg total) by mouth 4 (four) times daily as needed for diarrhea or loose stools., Disp: 30 tablet, Rfl: 0   meclizine (ANTIVERT) 25 MG tablet, Take 1 tablet (25 mg total) by mouth 3 (three) times daily as needed for dizziness., Disp: 30 tablet, Rfl: 1   metFORMIN (GLUCOPHAGE) 500 MG tablet, Take 1 tablet (500 mg total) by mouth daily with breakfast., Disp: 30 tablet, Rfl: 0   Phentermine-Topiramate (QSYMIA) 3.75-23 MG CP24, 1 capsule po with breakfast daily, Disp: 30 capsule, Rfl: 0   propranolol (INDERAL) 40 MG tablet, TAKE 1/2 TABLET BY MOUTH EVERY DAY, Disp: 45 tablet, Rfl: 1   SUMAtriptan (IMITREX) 50 MG tablet, Take 1 tablet (50  mg total) by mouth every 2 (two) hours as needed for migraine. May repeat in 2 hours if headache persists or recurs., Disp: 10 tablet, Rfl: 0   Vitamin D, Ergocalciferol, (DRISDOL) 1.25 MG (50000 UNIT) CAPS capsule, Take 1 capsule (50,000 Units total) by mouth every 7 (seven) days., Disp: 5 capsule, Rfl: 0  Observations/Objective: Patient is well-developed, well-nourished in no acute distress.  Resting comfortably  at home.  Head is normocephalic, atraumatic.  No labored breathing.  Speech is clear and coherent with logical content.  Patient is alert and oriented at baseline.    Assessment and Plan: 1. Menorrhagia with irregular cycle  -spoke at length about thing going on -work note and advised follow up with GYN  Patient acknowledged agreement and understanding of the plan.   Past Medical, Surgical, Social History, Allergies, and Medications have been Reviewed.     Follow Up Instructions: I discussed the assessment and treatment plan with the patient. The patient was provided an opportunity to  ask questions and all were answered. The patient agreed with the plan and demonstrated an understanding of the instructions.  A copy of instructions were sent to the patient via MyChart unless otherwise noted below.    The patient was advised to call back or seek an in-person evaluation if the symptoms worsen or if the condition fails to improve as anticipated.  Time:  I spent 10 minutes with the patient via telehealth technology discussing the above problems/concerns.    Sue Mayo, NP

## 2022-08-27 NOTE — Patient Instructions (Signed)
[name], thank you for joining [name], NP for today's virtual visit.  While this provider is not your primary care provider (PCP), if your PCP is located in our provider database this encounter information will be shared with them immediately following your visit.   [facility] account gives you access to today's visit and all your visits, tests, and labs performed at [facility] " click here if you don't have a [facility] account or go to [email or web address]  Consent: (Patient) Sue Jackson provided verbal consent for this virtual visit at the beginning of the encounter.  Current Medications:  Current Outpatient Medications:    amLODipine-benazepril (LOTREL) 5-10 MG capsule, TAKE 1 CAPSULE BY MOUTH EVERY DAY, Disp: 90 capsule, Rfl: 2   cetirizine (ZYRTEC ALLERGY) 10 MG tablet, Take 1 tablet (10 mg total) by mouth daily., Disp: 90 tablet, Rfl: 1   cyclobenzaprine (FLEXERIL) 5 MG tablet, Take 1 tablet (5 mg total) by mouth 3 (three) times daily as needed for muscle spasms., Disp: 21 tablet, Rfl: 0   EMGALITY 120 MG/ML SOAJ, INJECT 120 MG INTO THE SKIN EVERY 28 (TWENTY-EIGHT) DAYS., Disp: 1 mL, Rfl: 5   fluticasone (FLONASE) 50 MCG/ACT nasal spray, Place 2 sprays into both nostrils daily., Disp: 16 g, Rfl: 6   gabapentin (NEURONTIN) 100 MG capsule, Take 1 capsule (100 mg total) by mouth 3 (three) times daily., Disp: 90 capsule, Rfl: 3   gabapentin (NEURONTIN) 400 MG capsule, Take 1 capsule (400 mg total) by mouth 3 (three) times daily., Disp: 90 capsule, Rfl: 3   hydrOXYzine (ATARAX) 10 MG tablet, Take 1 tablet (10 mg total) by mouth 3 (three) times daily as needed., Disp: 90 tablet, Rfl: 3   ipratropium (ATROVENT) 0.03 % nasal spray, Place 2 sprays into both nostrils every 12 (twelve) hours., Disp: 30 mL, Rfl: 0   loperamide (IMODIUM A-D) 2 MG tablet, Take 1 tablet (2 mg total) by mouth 4 (four) times daily as needed for  diarrhea or loose stools., Disp: 30 tablet, Rfl: 0   meclizine (ANTIVERT) 25 MG tablet, Take 1 tablet (25 mg total) by mouth 3 (three) times daily as needed for dizziness., Disp: 30 tablet, Rfl: 1   metFORMIN (GLUCOPHAGE) 500 MG tablet, Take 1 tablet (500 mg total) by mouth daily with breakfast., Disp: 30 tablet, Rfl: 0   Phentermine-Topiramate (QSYMIA) 3.75-23 MG CP24, 1 capsule po with breakfast daily, Disp: 30 capsule, Rfl: 0   propranolol (INDERAL) 40 MG tablet, TAKE 1/2 TABLET BY MOUTH EVERY DAY, Disp: 45 tablet, Rfl: 1   SUMAtriptan (IMITREX) 50 MG tablet, Take 1 tablet (50 mg total) by mouth every 2 (two) hours as needed for migraine. May repeat in 2 hours if headache persists or recurs., Disp: 10 tablet, Rfl: 0   Vitamin D, Ergocalciferol, (DRISDOL) 1.25 MG (50000 UNIT) CAPS capsule, Take 1 capsule (50,000 Units total) by mouth every 7 (seven) days., Disp: 5 capsule, Rfl: 0   Medications ordered in this encounter:  No orders of the defined types were placed in this encounter.    *If you need refills on other medications prior to your next appointment, please contact your pharmacy*  Follow-Up: Call back or seek an in-person evaluation if the symptoms worsen or if the condition fails to improve as anticipated.  [facility] [phone]  Other Instructions  Follow up with your GYN   If you have been instructed to have an in-person evaluation today at  a local Urgent Care facility, please use the link below. It will take you to a list of all of our available [facility], including address, phone number and hours of operation. Please do not delay care.  [facility]  If you or a family member do not have a primary care provider, use the link below to schedule a visit and establish care. When you choose a [facility] primary care physician or advanced practice provider, you gain a long-term partner in health. Find a Primary Care Provider  Learn  more about 's in-office and virtual care options: [facility]

## 2022-09-01 NOTE — Progress Notes (Unsigned)
Chief Complaint:   OBESITY Sue Jackson is here to discuss her progress with her obesity treatment plan along with follow-up of her obesity related diagnoses. Sue Jackson is on the Category 3 Plan and states she is following her eating plan approximately 80% of the time. Sue Jackson states she is working out at gym 10-30 minutes 2 times per week.  Today's visit was #: 8 Starting weight: 229 lbs Starting date: 03/18/2022 Today's weight: 215 lbs Today's date: 08/25/2022 Total lbs lost to date: 14 lbs Total lbs lost since last in-office visit: 3  Interim History: Sue Jackson has been doing well following her nutrition plan overall. She reports her hunger is controlled except for occasional increased cravings.  She reports overall good satiety. She is not skipping any meals.  She is on Qsymia 3.75/23 mg and reports some very mild anxiety which does not affect her day-to-day functioning.  She reports no palpitations. She is down a total of 14 pounds.  Subjective:   1. Pre-diabetes Sue Jackson's A1c was 6.3 on 06/16/22-not at goal.  Taking Metformin 500 mg once daily.  Some mild GI upset when she eats simple carbs.  Working on decreasing simple carbs, increasing lean protein and exercise to promote weight loss and improve glycemic control/prevent the development of diabetes. 2. Vitamin D deficiency Vit D level of 30.8 on 03/18/22.  Sue Jackson is taking ergocalciferol once weekly with no side effects.  3. Polyphagia Sue Jackson is taking Qsymia 3.75-23 mg daily. (Not at risk for pregnancy) has lost 6.11% of  TBW since she started Qysmia on 04/01/22.  Review of PDMP today-no aberrancies noted.  4. Fatty liver Sue Jackson has evaluation/referral to GI for some feelings of ABD bloating--US showed increase in heterogeneous echogenicity consistent with hepatic steatosis and we discussed the role of weight loss in the treatment of metabolically associated fatty liver disease..  Assessment/Plan:   1. Pre-diabetes Continue  Metformin 500 mg daily.  Continue Prescribed Nutrition Plan and exercise to promote weight loss and improve glycemic control, prevent the development of diabetes.  2. Vitamin D deficiency Continue/Refill ergocalciferol once a week for 1 month with 0 refills.  Continue ergo Calciferol once weekly.  Will recheck vitamin D level 2-3 times yearly to avoid oversupplementation.  -Refill Vitamin D, Ergocalciferol, (DRISDOL) 1.25 MG (50000 UNIT) CAPS capsule; Take 1 capsule (50,000 Units total) by mouth every 7 (seven) days.  Dispense: 5 capsule; Refill: 0  3. Polyphagia Continue/Refill Qsymia 3.75-23 mg daily for 1 month with 0 refills.  Continue Prescribed Nutrition Plan and exercise to promote weight loss.  Refill- Phentermine-Topiramate (QSYMIA) 3.75-23 MG CP24; 1 capsule po with breakfast daily  Dispense: 30 capsule; Refill: 0  4. Fatty liver Continue to follow Prescribed Nutrition Plan and exercise to promote weight loss and decrease sat fat/cholesterol in diet which should improve metabolically associated fatty liver disease.  Follow up with GI as directed and will follow along with GI.  5. Obesity, current BMI 33.7 Sue Jackson is currently in the action stage of change. As such, her goal is to continue with weight loss efforts. She has agreed to the Category 3 Plan and keeping a food journal and adhering to recommended goals of 1500 calories and 110+ grams of protein.   Exercise goals: As is.  Behavioral modification strategies: increasing lean protein intake, decreasing simple carbohydrates, no skipping meals, and keeping a strict food journal.  Holly has agreed to follow-up with our clinic in 4 weeks. She was informed of the importance of frequent follow-up visits  to maximize her success with intensive lifestyle modifications for her multiple health conditions.   Objective:   Blood pressure 124/85, pulse (!) 52, temperature 98.1 F (36.7 C), height '5\' 7"'$  (1.702 m), weight 215 lb (97.5 kg),  SpO2 100 %. Body mass index is 33.67 kg/m.  General: Cooperative, alert, well developed, in no acute distress. HEENT: Conjunctivae and lids unremarkable. Cardiovascular: Regular rhythm.  Lungs: Normal work of breathing. Neurologic: No focal deficits.   Lab Results  Component Value Date   CREATININE 0.70 07/06/2022   BUN 17 07/06/2022   NA 138 07/06/2022   K 3.7 07/06/2022   CL 106 07/06/2022   CO2 22 03/18/2022   Lab Results  Component Value Date   ALT 22 03/18/2022   AST 5 03/18/2022   ALKPHOS 108 03/18/2022   BILITOT 0.4 03/18/2022   Lab Results  Component Value Date   HGBA1C 6.3 (H) 06/16/2022   HGBA1C 6.4 (H) 03/18/2022   HGBA1C 6.4 08/12/2021   HGBA1C 6.4 01/30/2020   HGBA1C 6.0 (A) 08/15/2018   No results found for: "INSULIN" Lab Results  Component Value Date   TSH 1.810 03/18/2022   Lab Results  Component Value Date   CHOL 154 03/18/2022   HDL 52 03/18/2022   LDLCALC 89 03/18/2022   TRIG 65 03/18/2022   CHOLHDL 3.0 03/18/2022   Lab Results  Component Value Date   VD25OH 30.8 03/18/2022   Lab Results  Component Value Date   WBC 6.7 07/06/2022   HGB 14.3 07/06/2022   HCT 42.0 07/06/2022   MCV 82.8 07/06/2022   PLT 293 07/06/2022   Lab Results  Component Value Date   IRON 45 03/18/2022   TIBC 396 03/18/2022   FERRITIN 29 03/18/2022   Attestation Statements:   Reviewed by clinician on day of visit: allergies, medications, problem list, medical history, surgical history, family history, social history, and previous encounter notes.  I, Brendell Tyus, am acting as transcriptionist for [employer], PA.  I have reviewed the above documentation for accuracy and completeness, and I agree with the above. -  Ariadne Rissmiller,PA-C

## 2022-09-08 ENCOUNTER — Ambulatory Visit (INDEPENDENT_AMBULATORY_CARE_PROVIDER_SITE_OTHER): Payer: 59 | Admitting: Physician Assistant

## 2022-09-08 DIAGNOSIS — E669 Obesity, unspecified: Secondary | ICD-10-CM | POA: Insufficient documentation

## 2022-09-08 NOTE — Progress Notes (Addendum)
TeleHealth Visit:  This visit was completed with telemedicine (audio/video) technology. Sue Jackson has verbally consented to this TeleHealth visit. The patient is located at home, the provider is located at home. The participants in this visit include the listed provider and patient. The visit was conducted today via MyChart video.  OBESITY Sue Jackson is here to discuss her progress with her obesity treatment plan along with follow-up of her obesity related diagnoses.   Today's visit was # 9 Starting weight: 229 lbs Starting date: 03/18/2022 Weight at last in office visit: 215 lbs on 08/25/22 Total weight loss: 14 lbs at last in office visit on 08/25/22. Today's reported weight: No weight reported.  Nutrition Plan: Category 3 Plan and keeping a food journal and adhering to recommended goals of 1500 calories and 110+ grams of protein. .   Current exercise: walking and lifting in course of day at work at [employer].   Interim History:  Sue Jackson has been dealing with some anxiety and has not been feeling well.  She is also had dizziness recently.  She wonders if this is related to her diet. She is following the category 3 loosely.  She has not been journaling.  She is focusing on reducing carbohydrates in her diet and increasing protein.  She has really cut back on bread, potatoes, and other carbohydrates. She feels that water intake is good.  She notes hunger even when she is eating the prescribed amount of protein and calories.  Assessment/Plan:  1. Vitamin D Deficiency Vitamin D is not at goal of 50.  Last vitamin D level was 30.8 on 03/18/2022. She is on weekly prescription Vitamin D 50,000 IU.  Lab Results  Component Value Date   VD25OH 30.8 03/18/2022    Plan: Refill prescription vitamin D 50,000 IU weekly.   2. Prediabetes A1c done at [facility] on 08/31/2022 was 6.1, down from 6.3 on 06/16/2022.  She has strong family history of diabetes. She stopped taking the metformin because  it made her feel bad. Lab Results  Component Value Date   HGBA1C 6.3 (H) 06/16/2022   No results found for: "INSULIN"  Plan: Continue to reduce carbohydrates in diet. GLP 1/GIP agonist will be beneficial for her if we are able to get coverage.   3. Polyphagia Sue Jackson endorses excessive hunger even when following the plan well. Medication(s): Qsymia 3.75/23 mg daily. Denies side effects from the Qsymia.  She does not feel it has exacerbated her anxiety.  She feels she does get a small benefit from it but still has a lot of hunger.  She is not able to tolerate the second dose of Qsymia.  She said it makes her feel "crazy".  Plan: Continue Qsymia 3.75/23 mg daily.  No chance of pregnancy.  She is homosexual and has a wife.  She did seek fertility treatment but reports that her eggs were not viable. Qsymia 3.75/23 mg daily is not providing good appetite suppression. She is unable to tolerate higher doses of Qsymia.  Mancel Parsons and Kirke Shaggy are not currently available due to the national drug shortage.  Contrave is contraindicated due to her bipolar diagnosis.  Therefore I will start Zepbound pending insurance approval. If she is able to start the Zepbound, stop Qsymia. New prescription for Zepbound 2.5 mg weekly. [name] denies personal or family history of thyroid cancer, history of pancreatitis, or current cholelithiasis. Sue Jackson was informed of the most common side effects (nausea, constipation, diarrhea).  4. Anxiety/bipolar 2 Has had quite a bit of anxiety recently  and canceled her appointment yesterday because of a panic attack.   She is going to make an appointment with her counselor.  She sees Eulis Canner, PMHNP.  She takes gabapentin and hydroxyzine for anxiety. Been on numerous psychiatric medications in the past such as Zoloft which caused side effects.  She prefers therapy to medication.  Plan: Make appointment with counselor. Follow-up with Burt Ek as  directed. Continue gabapentin and hydroxyzine as directed.  5. Obesity: Current BMI 33 Sue Jackson is currently in the action stage of change. As such, her goal is to continue with weight loss efforts.  She has agreed to the Category 3 Plan.   Exercise goals: as is  Behavioral modification strategies: increasing lean protein intake, decreasing simple carbohydrates, and planning for success.  Jaid has agreed to follow-up with our clinic in 2 weeks.   No orders of the defined types were placed in this encounter.   Medications Discontinued During This Encounter  Medication Reason   Vitamin D, Ergocalciferol, (DRISDOL) 1.25 MG (50000 UNIT) CAPS capsule Reorder   metFORMIN (GLUCOPHAGE) 500 MG tablet Side effect (s)     Meds ordered this encounter  Medications   tirzepatide (ZEPBOUND) 2.5 MG/0.5ML Pen    Sig: Inject 2.5 mg into the skin once a week.    Dispense:  2 mL    Refill:  0    Order Specific Question:   Supervising Provider    Answer:   Netty Starring   Vitamin D, Ergocalciferol, (DRISDOL) 1.25 MG (50000 UNIT) CAPS capsule    Sig: Take 1 capsule (50,000 Units total) by mouth every 7 (seven) days.    Dispense:  5 capsule    Refill:  0    Order Specific Question:   Supervising Provider    Answer:   Dell Ponto [[id number]]      Objective:   VITALS: Per patient if applicable, see vitals. GENERAL: Alert and in no acute distress. CARDIOPULMONARY: No increased WOB. Speaking in clear sentences.  PSYCH: Pleasant and cooperative. Speech normal rate and rhythm. Affect is appropriate. Insight and judgement are appropriate. Attention is focused, linear, and appropriate.  NEURO: Oriented as arrived to appointment on time with no prompting.   Lab Results  Component Value Date   CREATININE 0.70 07/06/2022   BUN 17 07/06/2022   NA 138 07/06/2022   K 3.7 07/06/2022   CL 106 07/06/2022   CO2 22 03/18/2022   Lab Results  Component Value Date   ALT 22 03/18/2022   AST 5  03/18/2022   ALKPHOS 108 03/18/2022   BILITOT 0.4 03/18/2022   Lab Results  Component Value Date   HGBA1C 6.3 (H) 06/16/2022   HGBA1C 6.4 (H) 03/18/2022   HGBA1C 6.4 08/12/2021   HGBA1C 6.4 01/30/2020   HGBA1C 6.0 (A) 08/15/2018   No results found for: "INSULIN" Lab Results  Component Value Date   TSH 1.810 03/18/2022   Lab Results  Component Value Date   CHOL 154 03/18/2022   HDL 52 03/18/2022   LDLCALC 89 03/18/2022   TRIG 65 03/18/2022   CHOLHDL 3.0 03/18/2022   Lab Results  Component Value Date   WBC 6.7 07/06/2022   HGB 14.3 07/06/2022   HCT 42.0 07/06/2022   MCV 82.8 07/06/2022   PLT 293 07/06/2022   Lab Results  Component Value Date   IRON 45 03/18/2022   TIBC 396 03/18/2022   FERRITIN 29 03/18/2022   Lab Results  Component Value Date  VD25OH 30.8 03/18/2022    Attestation Statements:   Reviewed by clinician on day of visit: allergies, medications, problem list, medical history, surgical history, family history, social history, and previous encounter notes.

## 2022-09-09 ENCOUNTER — Telehealth (INDEPENDENT_AMBULATORY_CARE_PROVIDER_SITE_OTHER): Payer: Self-pay | Admitting: *Deleted

## 2022-09-09 ENCOUNTER — Encounter (INDEPENDENT_AMBULATORY_CARE_PROVIDER_SITE_OTHER): Payer: Self-pay | Admitting: Family Medicine

## 2022-09-09 ENCOUNTER — Telehealth (INDEPENDENT_AMBULATORY_CARE_PROVIDER_SITE_OTHER): Payer: 59 | Admitting: Family Medicine

## 2022-09-09 DIAGNOSIS — R632 Polyphagia: Secondary | ICD-10-CM | POA: Diagnosis not present

## 2022-09-09 DIAGNOSIS — E559 Vitamin D deficiency, unspecified: Secondary | ICD-10-CM

## 2022-09-09 DIAGNOSIS — Z6833 Body mass index (BMI) 33.0-33.9, adult: Secondary | ICD-10-CM

## 2022-09-09 DIAGNOSIS — R7303 Prediabetes: Secondary | ICD-10-CM

## 2022-09-09 DIAGNOSIS — F3181 Bipolar II disorder: Secondary | ICD-10-CM

## 2022-09-09 DIAGNOSIS — F419 Anxiety disorder, unspecified: Secondary | ICD-10-CM

## 2022-09-09 DIAGNOSIS — E669 Obesity, unspecified: Secondary | ICD-10-CM

## 2022-09-09 MED ORDER — ZEPBOUND 2.5 MG/0.5ML ~~LOC~~ SOAJ: 2.5000 mg | SUBCUTANEOUS | 0 refills | Status: DC

## 2022-09-09 MED ORDER — VITAMIN D (ERGOCALCIFEROL) 1.25 MG (50000 UNIT) PO CAPS: 50000.0000 [IU] | ORAL_CAPSULE | ORAL | 0 refills | Status: DC

## 2022-09-09 NOTE — Telephone Encounter (Signed)
Prior authorization done via cover my meds waiting on determination.

## 2022-09-10 ENCOUNTER — Encounter (INDEPENDENT_AMBULATORY_CARE_PROVIDER_SITE_OTHER): Payer: Self-pay | Admitting: Family Medicine

## 2022-09-10 ENCOUNTER — Encounter (INDEPENDENT_AMBULATORY_CARE_PROVIDER_SITE_OTHER): Payer: Self-pay

## 2022-09-12 ENCOUNTER — Encounter (INDEPENDENT_AMBULATORY_CARE_PROVIDER_SITE_OTHER): Payer: Self-pay | Admitting: Family Medicine

## 2022-09-17 ENCOUNTER — Telehealth: Payer: 59 | Admitting: Nurse Practitioner

## 2022-09-17 DIAGNOSIS — M25571 Pain in right ankle and joints of right foot: Secondary | ICD-10-CM | POA: Diagnosis not present

## 2022-09-17 DIAGNOSIS — M25471 Effusion, right ankle: Secondary | ICD-10-CM | POA: Diagnosis not present

## 2022-09-17 NOTE — Patient Instructions (Signed)
     For an urgent face to face visit, [facility] has eight urgent care centers for your convenience:   NEW!! [facility] [place] [facility] [place] Get Driving Directions [phone] [address] [place], [place] [facility] [place] [facility] [place] Get Driving Directions [phone] [place], [place] [address]   [facility]) Get Driving Directions [phone] [address], [place] [address]  [facility] [employer] ([place]) Get Driving Directions [phone] [address] [place],  [place]  [address]  [facility] at [address] [place] Get Driving Directions  [phone] [place],  [place] [address]   [facility] Get Driving Directions [phone] [place], [place], [place] [address]   [facility] Get Driving Directions  [phone] [address] [place], [place] [address]   [facility] [place] Get Driving Directions [phone] [address], [place], [place] [address]  Your MyChart E-visit questionnaire answers were reviewed by a board certified advanced clinical practitioner to complete your personal care plan based on your specific symptoms.  Thank you for using e-Visits.

## 2022-09-17 NOTE — Progress Notes (Signed)
Virtual Visit Consent   Sue Jackson, you are scheduled for a virtual visit with a [place] provider today. Just as with appointments in the office, your consent must be obtained to participate. Your consent will be active for this visit and any virtual visit you may have with one of our providers in the next 365 days. If you have a MyChart account, a copy of this consent can be sent to you electronically.  As this is a virtual visit, video technology does not allow for your provider to perform a traditional examination. This may limit your provider's ability to fully assess your condition. If your provider identifies any concerns that need to be evaluated in person or the need to arrange testing (such as labs, EKG, etc.), we will make arrangements to do so. Although advances in technology are sophisticated, we cannot ensure that it will always work on either your end or our end. If the connection with a video visit is poor, the visit may have to be switched to a telephone visit. With either a video or telephone visit, we are not always able to ensure that we have a secure connection.  By engaging in this virtual visit, you consent to the provision of healthcare and authorize for your insurance to be billed (if applicable) for the services provided during this visit. Depending on your insurance coverage, you may receive a charge related to this service.  I need to obtain your verbal consent now. Are you willing to proceed with your visit today? Sue Jackson has provided verbal consent on 09/17/2022 for a virtual visit (video or telephone). Sue Schneiders, FNP  Date: 09/17/2022 12:18 PM  Virtual Visit via Video Note   I, Sue Jackson, connected with  Sue Jackson  ([id number], 11/05/1980) on 09/17/22 at 12:15 PM EST by a video-enabled telemedicine application and verified that I am speaking with the correct person using two identifiers.  Location: Patient: Virtual  Visit Location Patient: Home Provider: Virtual Visit Location Provider: Home Office   I discussed the limitations of evaluation and management by telemedicine and the availability of in person appointments. The patient expressed understanding and agreed to proceed.    History of Present Illness: Sue Jackson is a 42 y.o. who identifies as a female who was assigned female at birth, and is being seen today for right ankle swelling after a fall.  A few days ago when she was moving furniture she twisted her ankle and she fell. She has been walking on it and she notes there is swelling.   She did purchase an ankle brace but has not used it yet  She feels overall that things are improving since the time of injury   She is able to bear weight with pain.  Notes it hurts more in the cold  She has applied ice for relief  She has used tylenol for pain relief   Problems:  Patient Active Problem List   Diagnosis Date Noted   Obesity: Starting BMI 35 09/08/2022   Headache, unspecified headache type 08/14/2022   Vestibular migraine 08/13/2022   RUQ abdominal pain 08/12/2022   Polyphagia 07/29/2022   Postoperative state 07/06/2022   Fibroids 05/19/2022   Class 2 severe obesity with serious comorbidity and body mass index (BMI) of 35.0 to 35.9 in adult [facility]) 04/20/2022   Uterine leiomyoma 04/20/2022   Pre-diabetes 04/20/2022   Vitamin D deficiency 04/01/2022   Insulin resistance 04/01/2022   Depression 04/01/2022   Malignant  neoplasm of right female breast, unspecified estrogen receptor status, unspecified site of breast ([place]) 08/13/2021   Genetic testing 08/20/2020   Family history of breast cancer    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   Bipolar 2 disorder ([place]) 11/16/2019   Prediabetes 08/15/2018   Anxiety 02/06/2018   Low back pain radiating to right lower extremity 07/06/2017   Upper back pain, chronic 07/06/2017   Hypertension, essential, benign  03/11/2017   Severe obesity (BMI 35.0-35.9 with comorbidity) ([place]) 03/11/2017    Allergies: No Known Allergies Medications:  Current Outpatient Medications:    amLODipine-benazepril (LOTREL) 5-10 MG capsule, TAKE 1 CAPSULE BY MOUTH EVERY DAY, Disp: 90 capsule, Rfl: 2   cetirizine (ZYRTEC ALLERGY) 10 MG tablet, Take 1 tablet (10 mg total) by mouth daily., Disp: 90 tablet, Rfl: 1   cyclobenzaprine (FLEXERIL) 5 MG tablet, Take 1 tablet (5 mg total) by mouth 3 (three) times daily as needed for muscle spasms., Disp: 21 tablet, Rfl: 0   EMGALITY 120 MG/ML SOAJ, INJECT 120 MG INTO THE SKIN EVERY 28 (TWENTY-EIGHT) DAYS., Disp: 1 mL, Rfl: 5   fluticasone (FLONASE) 50 MCG/ACT nasal spray, Place 2 sprays into both nostrils daily., Disp: 16 g, Rfl: 6   gabapentin (NEURONTIN) 100 MG capsule, Take 1 capsule (100 mg total) by mouth 3 (three) times daily., Disp: 90 capsule, Rfl: 3   gabapentin (NEURONTIN) 400 MG capsule, Take 1 capsule (400 mg total) by mouth 3 (three) times daily., Disp: 90 capsule, Rfl: 3   hydrOXYzine (ATARAX) 10 MG tablet, Take 1 tablet (10 mg total) by mouth 3 (three) times daily as needed., Disp: 90 tablet, Rfl: 3   ipratropium (ATROVENT) 0.03 % nasal spray, Place 2 sprays into both nostrils every 12 (twelve) hours., Disp: 30 mL, Rfl: 0   loperamide (IMODIUM A-D) 2 MG tablet, Take 1 tablet (2 mg total) by mouth 4 (four) times daily as needed for diarrhea or loose stools., Disp: 30 tablet, Rfl: 0   meclizine (ANTIVERT) 25 MG tablet, Take 1 tablet (25 mg total) by mouth 3 (three) times daily as needed for dizziness., Disp: 30 tablet, Rfl: 1   Phentermine-Topiramate (QSYMIA) 3.75-23 MG CP24, 1 capsule po with breakfast daily, Disp: 30 capsule, Rfl: 0   propranolol (INDERAL) 40 MG tablet, TAKE 1/2 TABLET BY MOUTH EVERY DAY, Disp: 45 tablet, Rfl: 1   SUMAtriptan (IMITREX) 50 MG tablet, Take 1 tablet (50 mg total) by mouth every 2 (two) hours as needed for migraine. May repeat in 2 hours if  headache persists or recurs., Disp: 10 tablet, Rfl: 0   tirzepatide (ZEPBOUND) 2.5 MG/0.5ML Pen, Inject 2.5 mg into the skin once a week., Disp: 2 mL, Rfl: 0   Vitamin D, Ergocalciferol, (DRISDOL) 1.25 MG (50000 UNIT) CAPS capsule, Take 1 capsule (50,000 Units total) by mouth every 7 (seven) days., Disp: 5 capsule, Rfl: 0  Observations/Objective: Patient is well-developed, well-nourished in no acute distress.  Resting comfortably  at home.  Head is normocephalic, atraumatic.  No labored breathing.  Speech is clear and coherent with logical content.  Patient is alert and oriented at baseline.    Assessment and Plan: 1. Pain and swelling of ankle, right Possible sprain, unable to give full diagnosis without in person evaluation and imaging as appropriate to r/o fracture Advised in person PCP or UC visit for further evaluation   Patient is mainly concerned about pain control and is requesting stronger pain medications.  Explained that at this time  we cannot prescribe stronger medications aside from Naproxen or prescription strength ibuprofen   Patient will follow up with PCP or UC for evaluation and treatment as directed       Follow Up Instructions: I discussed the assessment and treatment plan with the patient. The patient was provided an opportunity to ask questions and all were answered. The patient agreed with the plan and demonstrated an understanding of the instructions.  A copy of instructions were sent to the patient via MyChart unless otherwise noted below.    The patient was advised to call back or seek an in-person evaluation if the symptoms worsen or if the condition fails to improve as anticipated.  Time:  I spent 10 minutes with the patient via telehealth technology discussing the above problems/concerns.    Sue Schneiders, FNP

## 2022-09-20 ENCOUNTER — Telehealth: Payer: Self-pay

## 2022-09-20 NOTE — Telephone Encounter (Signed)
PA submitted through Cover My Meds for Zepbound.  Awaiting insurance determination.  Key: [id number]

## 2022-09-20 NOTE — Telephone Encounter (Signed)
Received fax from [place] that they are denying Zepbound. New notes were faxed from this morning.  Denial states: Your plan only covers this drug when you meet one of these options A. You have tried other drugs you're plan covers (preferred drugs) and they did not work well      For you. B. Your doctor gives [place] a medical reason you cannot take those other drugs. For your plan      You may need to try up to 3 preferred drugs.  We denied your request because you do not meet any of these conditions.  The preferred drugs for your plan are: Orlistat, Qsymia, Starleen Blue (Requirement: 3 in a class with 3 or more alternatives, 2 in a class with 2 alternatives, or 1 in a class with only 1 alternative)

## 2022-09-22 ENCOUNTER — Ambulatory Visit (INDEPENDENT_AMBULATORY_CARE_PROVIDER_SITE_OTHER): Payer: 59 | Admitting: Family Medicine

## 2022-09-22 ENCOUNTER — Encounter (INDEPENDENT_AMBULATORY_CARE_PROVIDER_SITE_OTHER): Payer: Self-pay | Admitting: Family Medicine

## 2022-09-22 ENCOUNTER — Encounter (INDEPENDENT_AMBULATORY_CARE_PROVIDER_SITE_OTHER): Payer: Self-pay

## 2022-09-22 VITALS — BP 124/77 | HR 60 | Temp 97.6°F | Ht 67.0 in | Wt 217.0 lb

## 2022-09-22 DIAGNOSIS — R632 Polyphagia: Secondary | ICD-10-CM

## 2022-09-22 DIAGNOSIS — F3181 Bipolar II disorder: Secondary | ICD-10-CM | POA: Diagnosis not present

## 2022-09-22 DIAGNOSIS — Z6834 Body mass index (BMI) 34.0-34.9, adult: Secondary | ICD-10-CM | POA: Diagnosis not present

## 2022-09-22 DIAGNOSIS — E669 Obesity, unspecified: Secondary | ICD-10-CM | POA: Insufficient documentation

## 2022-09-22 MED ORDER — TOPIRAMATE ER 25 MG PO CAP24: ORAL_CAPSULE | ORAL | 0 refills | Status: DC

## 2022-09-22 NOTE — Progress Notes (Signed)
Office: [phone]  /  Fax: [phone]  WEIGHT SUMMARY AND BIOMETRICS  Medical Weight Loss Height: 5' 7"$  (1.702 m) Weight: 217 lb (98.4 kg) Temp: 97.6 F (36.4 C) Pulse Rate: 60 BP: 124/77 SpO2: 98 % Fasting: no Labs: no Today's Visit #: 9 Weight at Last VIsit: 215lb Weight Lost Since Last Visit: +2  Body Fat %: 40.1 % Fat Mass (lbs): 87 lbs Muscle Mass (lbs): 123.6 lbs Total Body Water (lbs): 83.6 lbs Visceral Fat Rating : 9 Starting Date: 03/18/22 Starting Weight: 229lb Total Weight Loss (lbs): 12 lb (5.443 kg)    HPI  Chief Complaint: OBESITY  Sue Jackson is here to discuss her progress with her obesity treatment plan. She is following category 3 and states she is following her eating plan approximately 79 % of the time. She states she is not exercising.    Interval History:  Since last office visit she is up 2 lb. She is feeling hungry on 1500 kcal/ day Denies meal skipping.  Getting lean protein and fiber with all meals. Cravings more carbs and sweets after meals Currently on Qsymia 3.75/23 mg once daily for obesity.  She has lost no weight in the past 4 weeks.  She was intolerant of Qsymia 7.5/46 mg due to anxiety.  She is highly anxious about her weight and very concerned about increased hunger coming off Qsymia.  Her insurance did not cover GLP-1 agonist.  She is averaging over 10,000 steps a day on her smart watch  Pharmacotherapy: Qsymia  PHYSICAL EXAM:  Blood pressure 124/77, pulse 60, temperature 97.6 F (36.4 C), height 5' 7"$  (1.702 m), weight 217 lb (98.4 kg), SpO2 98 %. Body mass index is 33.99 kg/m.  General: She is overweight, cooperative, alert, well developed, and in no acute distress. PSYCH: Has normal mood, affect and thought process.   HEENT: EOMI, sclerae are anicteric. Lungs: Normal breathing effort, no conversational dyspnea. Extremities: No edema.  Neurologic: No gross sensory or motor deficits. No tremors or fasciculations noted.     DIAGNOSTIC DATA REVIEWED:  BMET    Component Value Date/Time   NA 138 07/06/2022 0914   NA 140 03/18/2022 0951   K 3.7 07/06/2022 0914   CL 106 07/06/2022 0914   CO2 22 03/18/2022 0951   GLUCOSE 118 (H) 07/06/2022 0914   BUN 17 07/06/2022 0914   BUN 21 03/18/2022 0951   CREATININE 0.70 07/06/2022 0914   CREATININE 1.11 (H) 04/16/2020 1211   CALCIUM 9.2 03/18/2022 0951   GFRNONAA >60 06/05/2020 0924   GFRNONAA >60 04/16/2020 1211   GFRAA >60 04/16/2020 1211   Lab Results  Component Value Date   HGBA1C 6.3 (H) 06/16/2022   HGBA1C 6.5 10/11/2017   No results found for: "INSULIN" Lab Results  Component Value Date   TSH 1.810 03/18/2022   CBC    Component Value Date/Time   WBC 6.7 07/06/2022 0900   RBC 4.99 07/06/2022 0900   HGB 14.3 07/06/2022 0914   HGB 14.4 03/18/2022 0951   HCT 42.0 07/06/2022 0914   HCT 46.3 03/18/2022 0951   PLT 293 07/06/2022 0900   PLT 325 03/18/2022 0951   MCV 82.8 07/06/2022 0900   MCV 83 03/18/2022 0951   MCH 26.5 07/06/2022 0900   MCHC 32.0 07/06/2022 0900   RDW 16.7 (H) 07/06/2022 0900   RDW 16.2 (H) 03/18/2022 0951   Iron Studies    Component Value Date/Time   IRON 45 03/18/2022 0951   TIBC 396 03/18/2022 0951  FERRITIN 29 03/18/2022 0951   IRONPCTSAT 11 (L) 03/18/2022 0951   Lipid Panel     Component Value Date/Time   CHOL 154 03/18/2022 0951   TRIG 65 03/18/2022 0951   HDL 52 03/18/2022 0951   CHOLHDL 3.0 03/18/2022 0951   CHOLHDL 3 01/30/2020 0813   VLDL 12.4 01/30/2020 0813   LDLCALC 89 03/18/2022 0951   Hepatic Function Panel     Component Value Date/Time   PROT 7.6 03/18/2022 0951   ALBUMIN 4.5 03/18/2022 0951   AST 5 03/18/2022 0951   AST 14 (L) 04/16/2020 1211   ALT 22 03/18/2022 0951   ALT 27 04/16/2020 1211   ALKPHOS 108 03/18/2022 0951   BILITOT 0.4 03/18/2022 0951   BILITOT 0.3 04/16/2020 1211      Component Value Date/Time   TSH 1.810 03/18/2022 0951   Nutritional Lab Results  Component  Value Date   VD25OH 30.8 03/18/2022     ASSESSMENT AND PLAN  TREATMENT PLAN FOR OBESITY:  Recommended Dietary Goals  Sue Jackson is currently in the action stage of change. As such, her goal is to continue weight management plan. She has agreed to keeping a food journal and adhering to recommended goals of 1800 calories and 110 g protein.  Behavioral Intervention  We discussed the following Behavioral Modification Strategies today: increasing lean protein intake, increasing vegetables, increase water intake, work on meal planning and easy cooking plans, think about ways to increase physical activity, emotional eating strategies, and avoiding temptations.  Additional resources provided today: NA  Recommended Physical Activity Goals  Sue Jackson has been advised to work up to 150 minutes of moderate intensity aerobic activity a week and strengthening exercises 2-3 times per week for cardiovascular health, weight loss maintenance and preservation of muscle mass.   She has agreed to Will begin resistance exercise 20 minutes, 3  times per week. Chosen activity weight training.   Pharmacotherapy We discussed various medication options to help Sue Jackson with her weight loss efforts and we both agreed to Trokendi 25 mg XR once daily.  ASSOCIATED CONDITIONS ADDRESSED TODAY  Polyphagia Assessment & Plan: Worsening despite low-dose Qsymia.  Intolerant of higher dose Qsymia due to high levels of anxiety.   Denies meal skipping and appears to be getting adequate protein and fiber with meals. Reviewed her step tracker and her most recent RMR.  Will increase her calories to 1800/day logging on my fitness pal including 110 g or more protein daily  Discontinue Qsymia due to lack of weight loss.  Explained that this is a controlled substance and patient had to actively be seeing weight loss.  Also concerned about high levels of anxiety.  Will change her to Trokendi XR 25 mg once daily.  She is not at  risk for pregnancy.  Orders: -     Topiramate ER; 1 capsule po daily  Dispense: 30 capsule; Refill: 0  Obesity,current BMI 34.0  Generalized obesity  Bipolar 2 disorder [facility]) Assessment & Plan: Patient is seeing psych and reports high levels of anxiety.  Encouraged her to talk to both her psychiatrist and her PCP about treatment options.  Encouraged her to continue self-care, eating on a schedule and adequate sleep at night.  Encouraged tracking of daily steps with a target goal over 10,000/day adding in resistance training 3 days a week.  Avoid use of generic phentermine, Qsymia or Contrave due to psychiatric side effects.       No follow-ups on file.Marland Kitchen She was informed of the  importance of frequent follow up visits to maximize her success with intensive lifestyle modifications for her multiple health conditions.   ATTESTASTION STATEMENTS:  Reviewed by clinician on day of visit: allergies, medications, problem list, medical history, surgical history, family history, social history, and previous encounter notes.   Time spent on visit including pre-visit chart review and post-visit care and charting was 30 minutes.    Dell Ponto, DO

## 2022-09-22 NOTE — Assessment & Plan Note (Signed)
Net weight loss 12 pounds starting our program in August Reviewed bioimpedance results Has failed to see adequate weight loss on Qsymia Has declined use of GLP-1 agonists Has been intolerant to metformin BMI is too low for weight loss surgery

## 2022-09-22 NOTE — Progress Notes (Unsigned)
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  UPDATE: Started Emgality.  Improved. Intensity:  moderate  Duration:  a day. Frequency:  1 a month.  Depends on her anxiety or what she eats.   Current NSAIDS/analgesics:  none Current triptans:  sumatriptan 546mCurrent ergotamine:  none Current anti-emetic:  none Current muscle relaxants:  none Current Antihypertensive medications:  propranolol 2097maily (for anxiety), amlodipine-benazepril Current Antidepressant medications:  none Current Anticonvulsant medications:  gabapentin 400m49mD (for anxiety) Current anti-CGRP:  Emgality Current Vitamins/Herbal/Supplements: none Current Antihistamines/Decongestants:  meclizine, Zyrtec, Flonase Other therapy:  none Hormone/birth control:  none Other medications:  none  Caffeine:  decaf coffee Alcohol:  rarely Smoker:  sometimes marijuana.  No cigarettes Diet:  No soda.  Tries to drink water Exercise:  trying to increase Depression:  stable; Anxiety:  yes Other pain:  back pain/arthritis Sleep hygiene:  okay - sometimes anxiety may case insomnia. Works for [employer]  HISTORY:  For many years she has been experiencing dizziness.  She describes an undulating sensation associated with photophobia, phonophobia, and sometimes nausea and stuttering speech but no visual disturbance, vomiting, numbness or weakness.  Rarely may have an occipital pressure headache.  Usually lasts a day.  Progressively have gotten worse over the past few years, now occurring daily.  Triggers include anxiety, chocolate, nuts, citrus fruits, spicy  foods, dairy, skipped meals, sleep deprivation, and heat.  Resting in cool dark and quiet room helps.  She saw ENT who told her that she has vestibular migraines.  Takes meclizine which helps.  Used to take frequently but now no more than once a week.   MRI of brain without contrast on 06/22/2020 was normal.    Past NSAIDS/analgesics:  meloxicam Past abortive triptans:  none Past abortive ergotamine:  none Past muscle relaxants:  Flexeril Past anti-emetic:  none Past antihypertensive medications:  none Past antidepressant medications:  sertraline, citalopram, fluoxetine Past anticonvulsant medications:  topiramate (side effects) Past anti-CGRP:  none.  WOULD NOT USE AIMOVIG DUE TO HYPERTENSION Past vitamins/Herbal/Supplements:  none Past antihistamines/decongestants:  none Other past therapies:  chiropractor    Family history of headache:  sister (migraines), other sister (migraines, vertigo)  PAST MEDICAL HISTORY: Past Medical History:  Diagnosis Date   Anemia 2019-2020   Anxiety    Arthritis    Bipolar disorder (HCC)[place]13   Cancer (HCC)[id number]   Depression    Family history of breast cancer    Headache 2021   Hypertension    Personal history of radiation therapy    Pre-diabetes     MEDICATIONS: Current Outpatient Medications on File Prior to Visit  Medication Sig Dispense Refill   amLODipine-benazepril (LOTREL) 5-10 MG capsule TAKE 1 CAPSULE BY MOUTH EVERY DAY 90 capsule 2   cetirizine (ZYRTEC ALLERGY) 10 MG tablet Take 1 tablet (10 mg total) by mouth daily. 90 tablet 1   cyclobenzaprine (FLEXERIL) 5 MG tablet Take 1 tablet (5 mg total) by mouth 3 (three) times daily as needed for muscle spasms. 21 tablet 0   EMGALITY 120 MG/ML SOAJ INJECT 120 MG INTO THE SKIN EVERY  28 (TWENTY-EIGHT) DAYS. 1 mL 5   fluticasone (FLONASE) 50 MCG/ACT nasal spray Place 2 sprays into both nostrils daily. 16 g 6   gabapentin (NEURONTIN) 100 MG capsule Take 1 capsule (100 mg total) by mouth 3  (three) times daily. 90 capsule 3   gabapentin (NEURONTIN) 400 MG capsule Take 1 capsule (400 mg total) by mouth 3 (three) times daily. 90 capsule 3   hydrOXYzine (ATARAX) 10 MG tablet Take 1 tablet (10 mg total) by mouth 3 (three) times daily as needed. 90 tablet 3   ipratropium (ATROVENT) 0.03 % nasal spray Place 2 sprays into both nostrils every 12 (twelve) hours. 30 mL 0   loperamide (IMODIUM A-D) 2 MG tablet Take 1 tablet (2 mg total) by mouth 4 (four) times daily as needed for diarrhea or loose stools. 30 tablet 0   meclizine (ANTIVERT) 25 MG tablet Take 1 tablet (25 mg total) by mouth 3 (three) times daily as needed for dizziness. 30 tablet 1   Phentermine-Topiramate (QSYMIA) 3.75-23 MG CP24 1 capsule po with breakfast daily 30 capsule 0   propranolol (INDERAL) 40 MG tablet TAKE 1/2 TABLET BY MOUTH EVERY DAY 45 tablet 1   SUMAtriptan (IMITREX) 50 MG tablet Take 1 tablet (50 mg total) by mouth every 2 (two) hours as needed for migraine. May repeat in 2 hours if headache persists or recurs. 10 tablet 0   tirzepatide (ZEPBOUND) 2.5 MG/0.5ML Pen Inject 2.5 mg into the skin once a week. 2 mL 0   Vitamin D, Ergocalciferol, (DRISDOL) 1.25 MG (50000 UNIT) CAPS capsule Take 1 capsule (50,000 Units total) by mouth every 7 (seven) days. 5 capsule 0   No current facility-administered medications on file prior to visit.    ALLERGIES: No Known Allergies  FAMILY HISTORY: Family History  Problem Relation Age of Onset   Arthritis Mother    Asthma Mother    Cirrhosis Father        d. 51   Depression Maternal Aunt    Hypertension Maternal Aunt    Breast cancer Maternal Aunt 51   Breast cancer Maternal Aunt 59   Breast cancer Paternal Aunt    Dementia Maternal Grandmother    Diabetes Maternal Grandfather    Heart disease Maternal Grandfather    Hypertension Maternal Grandfather    Stroke Maternal Grandfather    Breast cancer Cousin        pat first cousin      Objective:  Blood pressure  123/83, pulse 78, height 5' 5"$  (1.651 m), weight 218 lb (98.9 kg), SpO2 99 %. General: No acute distress.  Patient appears welgroomedintact.  Gait normal, Romberg negative.   Metta Clines, DO  CC: Sue Martinique, MD

## 2022-09-22 NOTE — Assessment & Plan Note (Signed)
Patient is seeing psych and reports high levels of anxiety.  Encouraged her to talk to both her psychiatrist and her PCP about treatment options.  Encouraged her to continue self-care, eating on a schedule and adequate sleep at night.  Encouraged tracking of daily steps with a target goal over 10,000/day adding in resistance training 3 days a week.  Avoid use of generic phentermine, Qsymia or Contrave due to psychiatric side effects.

## 2022-09-22 NOTE — Assessment & Plan Note (Signed)
Worsening despite low-dose Qsymia.  Intolerant of higher dose Qsymia due to high levels of anxiety.   Denies meal skipping and appears to be getting adequate protein and fiber with meals. Reviewed her step tracker and her most recent RMR.  Will increase her calories to 1800/day logging on my fitness pal including 110 g or more protein daily  Discontinue Qsymia due to lack of weight loss.  Explained that this is a controlled substance and patient had to actively be seeing weight loss.  Also concerned about high levels of anxiety.  Will change her to Trokendi XR 25 mg once daily.  She is not at risk for pregnancy.

## 2022-09-23 ENCOUNTER — Ambulatory Visit: Payer: 59 | Admitting: Neurology

## 2022-09-23 ENCOUNTER — Encounter: Payer: Self-pay | Admitting: Neurology

## 2022-09-23 ENCOUNTER — Encounter: Payer: Self-pay | Admitting: Gastroenterology

## 2022-09-23 DIAGNOSIS — G43809 Other migraine, not intractable, without status migrainosus: Secondary | ICD-10-CM

## 2022-09-23 MED ORDER — EMGALITY 120 MG/ML ~~LOC~~ SOAJ: 120.0000 mg | SUBCUTANEOUS | 5 refills | Status: DC

## 2022-09-23 MED ORDER — SUMATRIPTAN SUCCINATE 50 MG PO TABS: 50.0000 mg | ORAL_TABLET | ORAL | 5 refills | Status: DC | PRN

## 2022-09-23 NOTE — Telephone Encounter (Signed)
Please advise 

## 2022-09-23 NOTE — Patient Instructions (Signed)
Continue Emgality

## 2022-09-26 ENCOUNTER — Encounter (INDEPENDENT_AMBULATORY_CARE_PROVIDER_SITE_OTHER): Payer: Self-pay

## 2022-10-05 NOTE — Progress Notes (Unsigned)
  TeleHealth Visit:  This visit was completed with telemedicine (audio/video) technology. Sue Jackson has verbally consented to this TeleHealth visit. The patient is located at home, the provider is located at home. The participants in this visit include the listed provider and patient. The visit was conducted today via MyChart video.  OBESITY Sue Jackson is here to discuss her progress with her obesity treatment plan along with follow-up of her obesity related diagnoses.   Today's visit was # 11 Starting Date: 03/18/22 Starting Weight: 229lb Weight at last in office visit: 217 lbs on 09/22/22 Total weight loss: 12 lbs at last in office visit on 09/22/22. Today's reported weight: *** lbs No weight reported.  Nutrition Plan: the Category 3 plan.  Current exercise: {exercise types:16438}  Interim History:  *** Pharmacotherapy: Sue Jackson is on {dwwglp:29109}. Adverse side effects: {dwwse:29122} Hunger is {EWCONTROLASSESSMENT:24261}.  Assessment/Plan:  1. ***  2. ***  3. ***  Generalized Obesity: Current BMI 35.8 Pharmacotherapy Plan {dwwmed:29123} {dwwglp:29109}.  Sue Jackson {CHL AMB IS/IS NOT:210130109} currently in the action stage of change. As such, her goal is to {MWMwtloss#1:210800005}.  She has agreed to {dwwsldiets:29085}.  Exercise goals: {MWM EXERCISE RECS:23473}  Behavioral modification strategies: {dwwslwtlossstrategies:29088}.  Sue Jackson has agreed to follow-up with our clinic in {NUMBER 1-10:22536} weeks.   No orders of the defined types were placed in this encounter.   There are no discontinued medications.   No orders of the defined types were placed in this encounter.     Objective:   VITALS: Per patient if applicable, see vitals. GENERAL: Alert and in no acute distress. CARDIOPULMONARY: No increased WOB. Speaking in clear sentences.  PSYCH: Pleasant and cooperative. Speech normal rate and rhythm. Affect is appropriate. Insight and judgement are  appropriate. Attention is focused, linear, and appropriate.  NEURO: Oriented as arrived to appointment on time with no prompting.   Attestation Statements:   Reviewed by clinician on day of visit: allergies, medications, problem list, medical history, surgical history, family history, social history, and previous encounter notes.  ***(delete if time-based billing not used) Time spent on visit including the items listed below was *** minutes.  -preparing to see the patient (e.g., review of tests, history, previous notes) -obtaining and/or reviewing separately obtained history -counseling and educating the patient/family/caregiver -documenting clinical information in the electronic or other health record -ordering medications, tests, or procedures -independently interpreting results and communicating results to the patient/ family/caregiver -referring and communicating with other health care professionals  -care coordination   This was prepared with the assistance of Presenter, broadcasting.  Occasional wrong-word or sound-a-like substitutions may have occurred due to the inherent limitations of voice recognition software.

## 2022-10-06 ENCOUNTER — Encounter (INDEPENDENT_AMBULATORY_CARE_PROVIDER_SITE_OTHER): Payer: Self-pay | Admitting: Family Medicine

## 2022-10-06 ENCOUNTER — Telehealth (INDEPENDENT_AMBULATORY_CARE_PROVIDER_SITE_OTHER): Payer: 59 | Admitting: Family Medicine

## 2022-10-06 DIAGNOSIS — Z6835 Body mass index (BMI) 35.0-35.9, adult: Secondary | ICD-10-CM

## 2022-10-06 DIAGNOSIS — R7303 Prediabetes: Secondary | ICD-10-CM

## 2022-10-06 DIAGNOSIS — E669 Obesity, unspecified: Secondary | ICD-10-CM | POA: Diagnosis not present

## 2022-10-06 DIAGNOSIS — Z6833 Body mass index (BMI) 33.0-33.9, adult: Secondary | ICD-10-CM

## 2022-10-07 ENCOUNTER — Ambulatory Visit (INDEPENDENT_AMBULATORY_CARE_PROVIDER_SITE_OTHER): Payer: 59 | Admitting: Clinical

## 2022-10-07 DIAGNOSIS — F3181 Bipolar II disorder: Secondary | ICD-10-CM

## 2022-10-08 NOTE — Progress Notes (Unsigned)
THERAPIST PROGRESS NOTE Virtual Visit via Video Note  I connected with Sue Jackson on 10/07/2022 at 11:00 AM EST by a video enabled telemedicine application and verified that I am speaking with the correct person using two identifiers.  Location: Patient: home Provider: office   I discussed the limitations of evaluation and management by telemedicine and the availability of in person appointments. The patient expressed understanding and agreed to proceed.  Follow Up Instructions: I discussed the assessment and treatment plan with the patient. The patient was provided an opportunity to ask questions and all were answered. The patient agreed with the plan and demonstrated an understanding of the instructions.   The patient was advised to call back or seek an in-person evaluation if the symptoms worsen or if the condition fails to improve as anticipated.    Session Time: 45 minutes  Participation Level: Active  Behavioral Response: CasualAlertAnxious and Irritable  Type of Therapy: Individual Therapy  Treatment Goals addressed: client will complete 80% of assigned homework  ProgressTowards Goals: Progressing  Interventions: CBT and Supportive  Summary:  Sue Jackson is a 42 y.o. female who presents for the scheduled appointment oriented times five, appropriately dressed, and friendly. Client denied hallucinations and delusions. Client reported she has been feeling overwhelmed and depressed. Client reported she and her wife are in the process of buying a home. Client reported her health has been a big stressor. Client reported she had an episode of crying in her wife's arms. Client reported she has been trying hard to eat right and make necessary changes but she does not feel good. Client reported work has been stressful as usual and over time has caused her to have a constant emotion of irritability. Client reported there is a lot of drama amongst the co  workers. Client reported she does not socialize and feels isolated. Client reported she has had small progress with improving her A1C. Client reported she is sad she cannot get up and do things she use to do because of her health. Client reported otherwise she and her wife had thought of having children one day and they have decided to adopt. Evidence of progress towards goal:  client reported 1 negative thought pattern related to health that contributes to depression.   Suicidal/Homicidal: Nowithout intent/plan  Therapist Response:  Therapist began the appointment asking the client how she has been doing since last seen. Therapist used CBT to engage using active listening and positive emotional support. Therapist used CBT to engage and ask the client the source of her depressed and anxious feelings. Therapist used CBT to normalize her emotions. Therapist used CBT ask the client to identify her progress with frequency of use with coping skills with continued practice in her daily activity.    Therapist assigned the client homework to practice self care.   Plan: Return again in 3 weeks.  Diagnosis: bipolar 2 disorder  Collaboration of Care: Patient refused AEB none requested by the client.  Patient/Guardian was advised Release of Information must be obtained prior to any record release in order to collaborate their care with an outside provider. Patient/Guardian was advised if they have not already done so to contact the registration department to sign all necessary forms in order for [place] to release information regarding their care.   Consent: Patient/Guardian gives verbal consent for treatment and assignment of benefits for services provided during this visit. Patient/Guardian expressed understanding and agreed to proceed.   [place], LCSW 10/07/2022

## 2022-10-14 ENCOUNTER — Ambulatory Visit: Payer: 59 | Admitting: Gastroenterology

## 2022-10-14 NOTE — Progress Notes (Deleted)
Referring Provider: Martinique, Betty G, MD Primary Care Physician:  Martinique, Betty G, MD  Reason for Consultation: Right upper quadrant pain   IMPRESSION:  ***  PLAN: ***   HPI: Sue Jackson is a 42 y.o. female   She has a history of hypertension, DCIS, bipolar, anxiety, and vestibular migraines.  Abdominal ultrasound 08/18/2022 showed an echogenic liver but was otherwise normal  Labs 03/18/2022 showing normal comprehensive metabolic panel except for glucose of 104, iron 45, ferritin 29, hemoglobin 14.4, MCV 83, RDW 16.2, platelets 325, hemoglobin A1c 6.4  There is no known family history of colon cancer or polyps. No family history of stomach cancer or other GI malignancy. No family history of inflammatory bowel disease or celiac.    Past Medical History:  Diagnosis Date   Anemia 2019-2020   Anxiety    Arthritis    Bipolar disorder ([place]) 2013   Cancer ([place]) 2021   Depression    Family history of breast cancer    Headache 2021   Hypertension    Personal history of radiation therapy    Pre-diabetes     Past Surgical History:  Procedure Laterality Date   BREAST BIOPSY Right 04/09/2020   BREAST BIOPSY Left 04/24/2020   x2   BREAST LUMPECTOMY Right 06/12/2020   BREAST LUMPECTOMY WITH RADIOACTIVE SEED LOCALIZATION Right 06/12/2020   Procedure: RIGHT BREAST LUMPECTOMY WITH RADIOACTIVE SEED LOCALIZATION;  Surgeon: Coralie Keens, MD;  Location: [facility];  Service: General;  Laterality: Right;  LMA   ROBOTIC ASSISTED LAPAROSCOPIC LYSIS OF ADHESION  07/06/2022   Procedure: XI ROBOTIC ASSISTED LAPAROSCOPIC LYSIS OF ADHESION;  Surgeon: [name], MD;  Location: [facility];  Service: Gynecology;;   ROBOTIC ASSISTED LAPAROSCOPIC OVARIAN CYSTECTOMY Right 07/06/2022   Procedure: XI ROBOTIC ASSISTED LAPAROSCOPIC right OVARIAN CYSTECTOMY with peritoneal washings;  Surgeon: [name], MD;  Location: [facility];  Service:  Gynecology;  Laterality: Right;      Current Outpatient Medications  Medication Sig Dispense Refill   amLODipine-benazepril (LOTREL) 5-10 MG capsule TAKE 1 CAPSULE BY MOUTH EVERY DAY 90 capsule 2   cetirizine (ZYRTEC ALLERGY) 10 MG tablet Take 1 tablet (10 mg total) by mouth daily. 90 tablet 1   cyclobenzaprine (FLEXERIL) 5 MG tablet Take 1 tablet (5 mg total) by mouth 3 (three) times daily as needed for muscle spasms. 21 tablet 0   fluticasone (FLONASE) 50 MCG/ACT nasal spray Place 2 sprays into both nostrils daily. 16 g 6   gabapentin (NEURONTIN) 100 MG capsule Take 1 capsule (100 mg total) by mouth 3 (three) times daily. 90 capsule 3   gabapentin (NEURONTIN) 400 MG capsule Take 1 capsule (400 mg total) by mouth 3 (three) times daily. 90 capsule 3   Galcanezumab-gnlm (EMGALITY) 120 MG/ML SOAJ Inject 120 mg into the skin every 28 (twenty-eight) days. 1 mL 5   hydrOXYzine (ATARAX) 10 MG tablet Take 1 tablet (10 mg total) by mouth 3 (three) times daily as needed. 90 tablet 3   ipratropium (ATROVENT) 0.03 % nasal spray Place 2 sprays into both nostrils every 12 (twelve) hours. 30 mL 0   loperamide (IMODIUM A-D) 2 MG tablet Take 1 tablet (2 mg total) by mouth 4 (four) times daily as needed for diarrhea or loose stools. 30 tablet 0   meclizine (ANTIVERT) 25 MG tablet Take 1 tablet (25 mg total) by mouth 3 (three) times daily as needed for dizziness. 30 tablet 1   propranolol (INDERAL) 40 MG tablet  TAKE 1/2 TABLET BY MOUTH EVERY DAY 45 tablet 1   SUMAtriptan (IMITREX) 50 MG tablet Take 1 tablet (50 mg total) by mouth as needed for migraine. May repeat in 2 hours if headache persists or recurs.  Maximum 2 tablets in 24 hours. 10 tablet 5   Topiramate ER (TROKENDI XR) 25 MG CP24 1 capsule po daily 30 capsule 0   Vitamin D, Ergocalciferol, (DRISDOL) 1.25 MG (50000 UNIT) CAPS capsule Take 1 capsule (50,000 Units total) by mouth every 7 (seven) days. 5 capsule 0   No current facility-administered  medications for this visit.    Allergies as of 10/14/2022   (No Known Allergies)    Family History  Problem Relation Age of Onset   Arthritis Mother    Asthma Mother    Cirrhosis Father        d. 72   Depression Maternal Aunt    Hypertension Maternal Aunt    Breast cancer Maternal Aunt 51   Breast cancer Maternal Aunt 59   Breast cancer Paternal Aunt    Dementia Maternal Grandmother    Diabetes Maternal Grandfather    Heart disease Maternal Grandfather    Hypertension Maternal Grandfather    Stroke Maternal Grandfather    Breast cancer Cousin        pat first cousin    Social History   Socioeconomic History   Marital status: Married    Spouse name: Not on file   Number of children: 0   Years of education: Not on file   Highest education level: Not on file  Occupational History   Not on file  Tobacco Use   Smoking status: Former    Types: E-cigarettes   Smokeless tobacco: Never   Tobacco comments:    VAPES  Vaping Use   Vaping Use: Every day   Substances: CBD  Substance and Sexual Activity   Alcohol use: Yes    Comment: occasionally   Drug use: Yes    Types: Marijuana   Sexual activity: Yes    Partners: Female  Other Topics Concern   Not on file  Social History Narrative   Right handed   Social Determinants of Health   Financial Resource Strain: Low Risk  (04/16/2020)   Overall Financial Resource Strain (CARDIA)    Difficulty of Paying Living Expenses: Not very hard  Food Insecurity: No Food Insecurity (04/16/2020)   Hunger Vital Sign    Worried About Running Out of Food in the Last Year: Never true    [place] in the Last Year: Never true  Transportation Needs: No Transportation Needs (04/16/2020)   PRAPARE - Hydrologist (Medical): No    Lack of Transportation (Non-Medical): No  Physical Activity: Not on file  Stress: Not on file  Social Connections: Not on file  Intimate Partner Violence: Not on file    Review  of Systems: 12 system ROS is negative except as noted above.   Physical Exam: General:   Alert,  well-nourished, pleasant and cooperative in NAD Head:  Normocephalic and atraumatic. Eyes:  Sclera clear, no icterus.   Conjunctiva pink. Ears:  Normal auditory acuity. Nose:  No deformity, discharge,  or lesions. Mouth:  No deformity or lesions.   Neck:  Supple; no masses or thyromegaly. Lungs:  Clear throughout to auscultation.   No wheezes. Heart:  Regular rate and rhythm; no murmurs. Abdomen:  Soft, nontender, nondistended, normal bowel sounds, no rebound or guarding. No  hepatosplenomegaly.   Rectal:  Deferred  Msk:  Symmetrical. No boney deformities LAD: No inguinal or umbilical LAD Extremities:  No clubbing or edema. Neurologic:  Alert and  oriented x4;  grossly nonfocal Skin:  Intact without significant lesions or rashes. Psych:  Alert and cooperative. Normal mood and affect.    Sue Jackson L. Tarri Glenn, MD, MPH 10/14/2022, 8:19 AM

## 2022-10-20 ENCOUNTER — Ambulatory Visit (INDEPENDENT_AMBULATORY_CARE_PROVIDER_SITE_OTHER): Payer: 59 | Admitting: Family Medicine

## 2022-10-20 ENCOUNTER — Encounter (INDEPENDENT_AMBULATORY_CARE_PROVIDER_SITE_OTHER): Payer: Self-pay | Admitting: Family Medicine

## 2022-10-20 VITALS — BP 123/81 | HR 61 | Temp 97.8°F | Ht 67.0 in | Wt 213.0 lb

## 2022-10-20 DIAGNOSIS — R632 Polyphagia: Secondary | ICD-10-CM

## 2022-10-20 DIAGNOSIS — F3181 Bipolar II disorder: Secondary | ICD-10-CM | POA: Diagnosis not present

## 2022-10-20 DIAGNOSIS — E559 Vitamin D deficiency, unspecified: Secondary | ICD-10-CM | POA: Diagnosis not present

## 2022-10-20 DIAGNOSIS — Z6833 Body mass index (BMI) 33.0-33.9, adult: Secondary | ICD-10-CM

## 2022-10-20 DIAGNOSIS — E669 Obesity, unspecified: Secondary | ICD-10-CM

## 2022-10-20 MED ORDER — TOPIRAMATE ER 50 MG PO CAP24: 50.0000 mg | ORAL_CAPSULE | Freq: Every day | ORAL | 0 refills | Status: DC

## 2022-10-20 NOTE — Assessment & Plan Note (Signed)
Improving on Trokendi XR 25 mg daily without adverse SE Not at risk for pregnancy (in female relationship) Doing better with intake of lean protein and fiber with meals  Avoid meal skipping.  Increase Trokendi XR to 50 mg once daily.  Call if any problems or questions.

## 2022-10-20 NOTE — Progress Notes (Signed)
Office: [phone]  /  Fax: [place]  Starting Date: 03/18/22  Starting Weight: 229   Weight Lost Since Last Visit: 4lb   Vitals Temp: 97.8 F (36.6 C) BP: 123/81 Pulse Rate: 61 SpO2: 99 %   Body Composition  Body Fat %: 39.6 % Fat Mass (lbs): 84.4 lbs Muscle Mass (lbs): 122.4 lbs Total Body Water (lbs): 84.8 lbs Visceral Fat Rating : 9   HPI  Chief Complaint: OBESITY  Sue Jackson is here to discuss her progress with her obesity treatment plan. She is on the keeping a food journal and adhering to recommended goals of 1800 calories and 110 protein and states she is following her eating plan approximately 80 % of the time. She states she is walking more at work.   Interval History:  Since last office visit she is down 4 lb She is doing well on Trokendi XR 25 mg daily She did move in to a new house and recently unpacked her house She is tracking her daily steps She is seeing psych for mood disorder  Anxiety has improved off of Qsymia  Pharmacotherapy: Trokendi  PHYSICAL EXAM:  Blood pressure 123/81, pulse 61, temperature 97.8 F (36.6 C), height '5\' 7"'$  (1.702 m), weight 213 lb (96.6 kg), SpO2 99 %. Body mass index is 33.36 kg/m.  General: She is overweight, cooperative, alert, well developed, and in no acute distress. PSYCH: Has normal mood, affect and thought process.  Less anxious Lungs: Normal breathing effort, no conversational dyspnea.   ASSESSMENT AND PLAN  TREATMENT PLAN FOR OBESITY:  Recommended Dietary Goals  [name] is currently in the action stage of change. As such, her goal is to continue weight management plan. She has agreed to keeping a food journal and adhering to recommended goals of 1800 calories and 110 g of protein.  Behavioral Intervention  We discussed the following Behavioral Modification Strategies today: increasing lean protein intake, increasing vegetables, avoiding skipping meals, increasing  water intake, work on meal planning and easy cooking plans, work on Interior and spatial designer calories using tracking App, emotional eating strategies and understanding the difference between hunger signals and cravings, and work on managing stress, creating time for self-care and relaxation measures.  Additional resources provided today: NA  Recommended Physical Activity Goals  Bettye has been advised to work up to 150 minutes of moderate intensity aerobic activity a week and strengthening exercises 2-3 times per week for cardiovascular health, weight loss maintenance and preservation of muscle mass.   She has agreed to Will continue regular aerobic exercise 30 minutes, 4-5 times per week. Chosen activity boxing/walking/weight training.  Pharmacotherapy changes for the treatment of obesity: Trokendi XR, increase to 50 mg once daily  ASSOCIATED CONDITIONS ADDRESSED TODAY  Polyphagia Assessment & Plan: Improving on Trokendi XR 25 mg daily without adverse SE Not at risk for pregnancy (in female relationship) Doing better with intake of lean protein and fiber with meals  Avoid meal skipping.  Increase Trokendi XR to 50 mg once daily.  Call if any problems or questions.   Orders: -     Topiramate ER; Take 1 capsule (50 mg total) by mouth daily with supper.  Dispense: 30 capsule; Refill: 0  Vitamin D deficiency Assessment & Plan: Last vitamin D Lab Results  Component Value Date   VD25OH 30.8 03/18/2022   She has been taking prescription vitamin D 50,000 IU weekly.  She admits to poor compliance and has extra vitamin D at home.  Continue vitamin D capsules once weekly and recheck level along with a chemistry panel next visit.   Generalized obesity: Starting BMI 35.8  BMI 33.0-33.9,adult  Bipolar 2 disorder (HCC) Assessment & Plan: Anxiety has improved.  She has a good support system at home but has gone through the stress of moving.  She is doing fairly well eating on a schedule  and making healthy food choices.  She is enjoying her new home and has an area to walk and workout.  She is seeing psychiatry and has follow-up scheduled.  Notably, she is taking gabapentin for mood disorder which can be weight gaining.       She was informed of the importance of frequent follow up visits to maximize her success with intensive lifestyle modifications for her multiple health conditions.   ATTESTASTION STATEMENTS:  Reviewed by clinician on day of visit: allergies, medications, problem list, medical history, surgical history, family history, social history, and previous encounter notes pertinent to obesity diagnosis.   I have personally spent 30 minutes total time today in preparation, patient care, nutritional counseling and documentation for this visit, including the following: review of clinical lab tests; review of medical tests/procedures/services.      Dell Ponto, DO DABFM, DABOM [facility] [address] [place], [place] [address] [phone]

## 2022-10-20 NOTE — Assessment & Plan Note (Signed)
Anxiety has improved.  She has a good support system at home but has gone through the stress of moving.  She is doing fairly well eating on a schedule and making healthy food choices.  She is enjoying her new home and has an area to walk and workout.  She is seeing psychiatry and has follow-up scheduled.  Notably, she is taking gabapentin for mood disorder which can be weight gaining.

## 2022-10-20 NOTE — Assessment & Plan Note (Signed)
Last vitamin D Lab Results  Component Value Date   VD25OH 30.8 03/18/2022   She has been taking prescription vitamin D 50,000 IU weekly.  She admits to poor compliance and has extra vitamin D at home.  Continue vitamin D capsules once weekly and recheck level along with a chemistry panel next visit.

## 2022-10-22 ENCOUNTER — Telehealth: Payer: 59 | Admitting: Physician Assistant

## 2022-10-22 DIAGNOSIS — J069 Acute upper respiratory infection, unspecified: Secondary | ICD-10-CM | POA: Diagnosis not present

## 2022-10-22 MED ORDER — MECLIZINE HCL 25 MG PO TABS: 25.0000 mg | ORAL_TABLET | Freq: Three times a day (TID) | ORAL | 0 refills | Status: DC | PRN

## 2022-10-22 MED ORDER — FLUTICASONE PROPIONATE 50 MCG/ACT NA SUSP: 2.0000 | Freq: Every day | NASAL | 0 refills | Status: AC

## 2022-10-22 NOTE — Patient Instructions (Signed)
[name], thank you for joining Leeanne Rio, PA-C for today's virtual visit.  While this provider is not your primary care provider (PCP), if your PCP is located in our provider database this encounter information will be shared with them immediately following your visit.   [facility] account gives you access to today's visit and all your visits, tests, and labs performed at [facility] " click here if you don't have a [facility] account or go to [email or web address]  Consent: (Patient) Sue Jackson provided verbal consent for this virtual visit at the beginning of the encounter.  Current Medications:  Current Outpatient Medications:    fluticasone (FLONASE) 50 MCG/ACT nasal spray, Place 2 sprays into both nostrils daily., Disp: 16 g, Rfl: 0   meclizine (ANTIVERT) 25 MG tablet, Take 1 tablet (25 mg total) by mouth 3 (three) times daily as needed for dizziness., Disp: 30 tablet, Rfl: 0   amLODipine-benazepril (LOTREL) 5-10 MG capsule, TAKE 1 CAPSULE BY MOUTH EVERY DAY, Disp: 90 capsule, Rfl: 2   cetirizine (ZYRTEC ALLERGY) 10 MG tablet, Take 1 tablet (10 mg total) by mouth daily., Disp: 90 tablet, Rfl: 1   cyclobenzaprine (FLEXERIL) 5 MG tablet, Take 1 tablet (5 mg total) by mouth 3 (three) times daily as needed for muscle spasms., Disp: 21 tablet, Rfl: 0   gabapentin (NEURONTIN) 100 MG capsule, Take 1 capsule (100 mg total) by mouth 3 (three) times daily., Disp: 90 capsule, Rfl: 3   gabapentin (NEURONTIN) 400 MG capsule, Take 1 capsule (400 mg total) by mouth 3 (three) times daily., Disp: 90 capsule, Rfl: 3   Galcanezumab-gnlm (EMGALITY) 120 MG/ML SOAJ, Inject 120 mg into the skin every 28 (twenty-eight) days., Disp: 1 mL, Rfl: 5   hydrOXYzine (ATARAX) 10 MG tablet, Take 1 tablet (10 mg total) by mouth 3 (three) times daily as needed., Disp: 90 tablet, Rfl: 3   ipratropium (ATROVENT) 0.03 % nasal spray, Place 2 sprays into  both nostrils every 12 (twelve) hours., Disp: 30 mL, Rfl: 0   loperamide (IMODIUM A-D) 2 MG tablet, Take 1 tablet (2 mg total) by mouth 4 (four) times daily as needed for diarrhea or loose stools., Disp: 30 tablet, Rfl: 0   propranolol (INDERAL) 40 MG tablet, TAKE 1/2 TABLET BY MOUTH EVERY DAY, Disp: 45 tablet, Rfl: 1   SUMAtriptan (IMITREX) 50 MG tablet, Take 1 tablet (50 mg total) by mouth as needed for migraine. May repeat in 2 hours if headache persists or recurs.  Maximum 2 tablets in 24 hours., Disp: 10 tablet, Rfl: 5   Topiramate ER (TROKENDI XR) 25 MG CP24, 1 capsule po daily, Disp: 30 capsule, Rfl: 0   Topiramate ER (TROKENDI XR) 50 MG CP24, Take 1 capsule (50 mg total) by mouth daily with supper., Disp: 30 capsule, Rfl: 0   Vitamin D, Ergocalciferol, (DRISDOL) 1.25 MG (50000 UNIT) CAPS capsule, Take 1 capsule (50,000 Units total) by mouth every 7 (seven) days., Disp: 5 capsule, Rfl: 0   Medications ordered in this encounter:  Meds ordered this encounter  Medications   fluticasone (FLONASE) 50 MCG/ACT nasal spray    Sig: Place 2 sprays into both nostrils daily.    Dispense:  16 g    Refill:  0    Order Specific Question:   Supervising Provider    Answer:   [name] [id number]   meclizine (ANTIVERT) 25 MG tablet    Sig: Take 1 tablet (25 mg total) by  mouth 3 (three) times daily as needed for dizziness.    Dispense:  30 tablet    Refill:  0    Order Specific Question:   Supervising Provider    Answer:   [name] [id number]     *If you need refills on other medications prior to your next appointment, please contact your pharmacy*  Follow-Up: Call back or seek an in-person evaluation if the symptoms worsen or if the condition fails to improve as anticipated.  [facility] [phone]  Other Instructions Please keep well-hydrated and rest. Start a saline nasal rinse to flush out nasal passages. Okay to start OTC Mucinex sinus. Use the meclizine  as directed if needed. Start the Flonase once daily as directed. If symptoms or not resolving or you note any worsening symptoms despite treatment, please seek an in person evaluation ASAP.  Do not delay care.  I have sent a work note to Doctor, hospital. You can find by going to the Menu on your homepage, scrolling down to the Communications section, and selecting Letters. Let us know if you have any issue locating. Take care and feel better soon!   If you have been instructed to have an in-person evaluation today at a local Urgent Care facility, please use the link below. It will take you to a list of all of our available [facility], including address, phone number and hours of operation. Please do not delay care.  [facility]  If you or a family member do not have a primary care provider, use the link below to schedule a visit and establish care. When you choose a [facility] primary care physician or advanced practice provider, you gain a long-term partner in health. Find a Primary Care Provider  Learn more about [facility]'s in-office and virtual care options: [facility]

## 2022-10-22 NOTE — Progress Notes (Signed)
Virtual Visit Consent   Sue Jackson, you are scheduled for a virtual visit with a [facility] provider today. Just as with appointments in the office, your consent must be obtained to participate. Your consent will be active for this visit and any virtual visit you may have with one of our providers in the next 365 days. If you have a MyChart account, a copy of this consent can be sent to you electronically.  As this is a virtual visit, video technology does not allow for your provider to perform a traditional examination. This may limit your provider's ability to fully assess your condition. If your provider identifies any concerns that need to be evaluated in person or the need to arrange testing (such as labs, EKG, etc.), we will make arrangements to do so. Although advances in technology are sophisticated, we cannot ensure that it will always work on either your end or our end. If the connection with a video visit is poor, the visit may have to be switched to a telephone visit. With either a video or telephone visit, we are not always able to ensure that we have a secure connection.  By engaging in this virtual visit, you consent to the provision of healthcare and authorize for your insurance to be billed (if applicable) for the services provided during this visit. Depending on your insurance coverage, you may receive a charge related to this service.  I need to obtain your verbal consent now. Are you willing to proceed with your visit today? Cathey Battle has provided verbal consent on 10/22/2022 for a virtual visit (video or telephone). Sue Jackson, [place]  Date: 10/22/2022 1:58 PM  Virtual Visit via Video Note   I, Sue Jackson, connected with  Latalia Dente  ([id number], Feb 27, 1981) on 10/22/22 at  1:15 PM EDT by a video-enabled telemedicine application and verified that I am speaking with the correct person using two  identifiers.  Location: Patient: Virtual Visit Location Patient: Mobile Provider: Virtual Visit Location Provider: Home Office   I discussed the limitations of evaluation and management by telemedicine and the availability of in person appointments. The patient expressed understanding and agreed to proceed.    History of Present Illness: Sue Jackson is a 42 y.o. who identifies as a female who was assigned female at birth, and is being seen today for URI symptoms starting 2 days ago. Notes nasal congestion, sinus pressure with rhinorrhea.  Vertigo -- feeling off balance.  Notes this is a common occurrence when she gets sick. No change in hearing. No chest congestion noted so far. Denies cough at present. Wife sick with similar symptoms.  They have tested negative for COVID.  OTC -- has not taken anything.    Patient is very nervous about trying to drive into work given the episodes of feeling off balance, along with her URI symptoms.  Is requesting a work note.  HPI: HPI  Problems:  Patient Active Problem List   Diagnosis Date Noted   Generalized obesity 09/22/2022   Headache, unspecified headache type 08/14/2022   Vestibular migraine 08/13/2022   RUQ abdominal pain 08/12/2022   Polyphagia 07/29/2022   Postoperative state 07/06/2022   Fibroids 05/19/2022   Uterine leiomyoma 04/20/2022   Vitamin D deficiency 04/01/2022   Insulin resistance 04/01/2022   Depression 04/01/2022   Malignant neoplasm of right female breast, unspecified estrogen receptor status, unspecified site of breast ([place]) 08/13/2021   Genetic testing 08/20/2020   Family history  of breast cancer    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   Bipolar 2 disorder ([place]) 11/16/2019   Prediabetes 08/15/2018   Anxiety 02/06/2018   Low back pain radiating to right lower extremity 07/06/2017   Upper back pain, chronic 07/06/2017   Hypertension, essential, benign 03/11/2017    Allergies: No  Known Allergies Medications:  Current Outpatient Medications:    fluticasone (FLONASE) 50 MCG/ACT nasal spray, Place 2 sprays into both nostrils daily., Disp: 16 g, Rfl: 0   meclizine (ANTIVERT) 25 MG tablet, Take 1 tablet (25 mg total) by mouth 3 (three) times daily as needed for dizziness., Disp: 30 tablet, Rfl: 0   amLODipine-benazepril (LOTREL) 5-10 MG capsule, TAKE 1 CAPSULE BY MOUTH EVERY DAY, Disp: 90 capsule, Rfl: 2   cetirizine (ZYRTEC ALLERGY) 10 MG tablet, Take 1 tablet (10 mg total) by mouth daily., Disp: 90 tablet, Rfl: 1   cyclobenzaprine (FLEXERIL) 5 MG tablet, Take 1 tablet (5 mg total) by mouth 3 (three) times daily as needed for muscle spasms., Disp: 21 tablet, Rfl: 0   gabapentin (NEURONTIN) 100 MG capsule, Take 1 capsule (100 mg total) by mouth 3 (three) times daily., Disp: 90 capsule, Rfl: 3   gabapentin (NEURONTIN) 400 MG capsule, Take 1 capsule (400 mg total) by mouth 3 (three) times daily., Disp: 90 capsule, Rfl: 3   Galcanezumab-gnlm (EMGALITY) 120 MG/ML SOAJ, Inject 120 mg into the skin every 28 (twenty-eight) days., Disp: 1 mL, Rfl: 5   hydrOXYzine (ATARAX) 10 MG tablet, Take 1 tablet (10 mg total) by mouth 3 (three) times daily as needed., Disp: 90 tablet, Rfl: 3   ipratropium (ATROVENT) 0.03 % nasal spray, Place 2 sprays into both nostrils every 12 (twelve) hours., Disp: 30 mL, Rfl: 0   loperamide (IMODIUM A-D) 2 MG tablet, Take 1 tablet (2 mg total) by mouth 4 (four) times daily as needed for diarrhea or loose stools., Disp: 30 tablet, Rfl: 0   propranolol (INDERAL) 40 MG tablet, TAKE 1/2 TABLET BY MOUTH EVERY DAY, Disp: 45 tablet, Rfl: 1   SUMAtriptan (IMITREX) 50 MG tablet, Take 1 tablet (50 mg total) by mouth as needed for migraine. May repeat in 2 hours if headache persists or recurs.  Maximum 2 tablets in 24 hours., Disp: 10 tablet, Rfl: 5   Topiramate ER (TROKENDI XR) 25 MG CP24, 1 capsule po daily, Disp: 30 capsule, Rfl: 0   Topiramate ER (TROKENDI XR) 50 MG  CP24, Take 1 capsule (50 mg total) by mouth daily with supper., Disp: 30 capsule, Rfl: 0   Vitamin D, Ergocalciferol, (DRISDOL) 1.25 MG (50000 UNIT) CAPS capsule, Take 1 capsule (50,000 Units total) by mouth every 7 (seven) days., Disp: 5 capsule, Rfl: 0  Observations/Objective: Patient is well-developed, well-nourished in no acute distress.  Resting comfortably in parked car at home.  Head is normocephalic, atraumatic.  No labored breathing.  Speech is clear and coherent with logical content.  Patient is alert and oriented at baseline.   Assessment and Plan: 1. Viral URI - fluticasone (FLONASE) 50 MCG/ACT nasal spray; Place 2 sprays into both nostrils daily.  Dispense: 16 g; Refill: 0 - meclizine (ANTIVERT) 25 MG tablet; Take 1 tablet (25 mg total) by mouth 3 (three) times daily as needed for dizziness.  Dispense: 30 tablet; Refill: 0  Start of a mild viral URI causing some eustachian tube dysfunction and subsequent feeling of slightly off balance.  No alarm signs or symptoms present.  Supportive measures and  OTC medications reviewed.  Meclizine refilled for her.  Flonase per orders.  In person evaluation if symptoms or not resolving or anything worsens despite treatment recommendations.  Work note provided.  Follow Up Instructions: I discussed the assessment and treatment plan with the patient. The patient was provided an opportunity to ask questions and all were answered. The patient agreed with the plan and demonstrated an understanding of the instructions.  A copy of instructions were sent to the patient via MyChart unless otherwise noted below.   The patient was advised to call back or seek an in-person evaluation if the symptoms worsen or if the condition fails to improve as anticipated.  Time:  I spent 10 minutes with the patient via telehealth technology discussing the above problems/concerns.    Sue Rio, PA-C

## 2022-10-25 ENCOUNTER — Telehealth: Payer: 59 | Admitting: Physician Assistant

## 2022-10-25 DIAGNOSIS — J208 Acute bronchitis due to other specified organisms: Secondary | ICD-10-CM | POA: Diagnosis not present

## 2022-10-25 DIAGNOSIS — B9689 Other specified bacterial agents as the cause of diseases classified elsewhere: Secondary | ICD-10-CM | POA: Diagnosis not present

## 2022-10-25 MED ORDER — PROMETHAZINE-DM 6.25-15 MG/5ML PO SYRP: 5.0000 mL | ORAL_SOLUTION | Freq: Four times a day (QID) | ORAL | 0 refills | Status: DC | PRN

## 2022-10-25 MED ORDER — ALBUTEROL SULFATE HFA 108 (90 BASE) MCG/ACT IN AERS: 1.0000 | INHALATION_SPRAY | Freq: Four times a day (QID) | RESPIRATORY_TRACT | 0 refills | Status: AC | PRN

## 2022-10-25 MED ORDER — PREDNISONE 20 MG PO TABS: 40.0000 mg | ORAL_TABLET | Freq: Every day | ORAL | 0 refills | Status: DC

## 2022-10-25 MED ORDER — AZITHROMYCIN 250 MG PO TABS: ORAL_TABLET | ORAL | 0 refills | Status: AC

## 2022-10-25 NOTE — Progress Notes (Signed)
Virtual Visit Consent   Sue Jackson, you are scheduled for a virtual visit with a [facility] provider today. Just as with appointments in the office, your consent must be obtained to participate. Your consent will be active for this visit and any virtual visit you may have with one of our providers in the next 365 days. If you have a MyChart account, a copy of this consent can be sent to you electronically.  As this is a virtual visit, video technology does not allow for your provider to perform a traditional examination. This may limit your provider's ability to fully assess your condition. If your provider identifies any concerns that need to be evaluated in person or the need to arrange testing (such as labs, EKG, etc.), we will make arrangements to do so. Although advances in technology are sophisticated, we cannot ensure that it will always work on either your end or our end. If the connection with a video visit is poor, the visit may have to be switched to a telephone visit. With either a video or telephone visit, we are not always able to ensure that we have a secure connection.  By engaging in this virtual visit, you consent to the provision of healthcare and authorize for your insurance to be billed (if applicable) for the services provided during this visit. Depending on your insurance coverage, you may receive a charge related to this service.  I need to obtain your verbal consent now. Are you willing to proceed with your visit today? [name] has provided verbal consent on 10/25/2022 for a virtual visit (video or telephone). Mar Daring, PA-C  Date: 10/25/2022 2:34 PM  Virtual Visit via Video Note   I, Mar Daring, connected with  [name]  ([id number], 21-Jun-1981) on 10/25/22 at  2:30 PM EDT by a video-enabled telemedicine application and verified that I am speaking with the correct person using two  identifiers.  Location: Patient: Virtual Visit Location Patient: Home Provider: Virtual Visit Location Provider: Home Office   I discussed the limitations of evaluation and management by telemedicine and the availability of in person appointments. The patient expressed understanding and agreed to proceed.    History of Present Illness: Sue Jackson is a 42 y.o. who identifies as a female who was assigned female at birth, and is being seen today for URI symptoms.  HPI: URI  This is a new problem. The current episode started in the past 7 days (Symptoms started around 10/20/22; Seen virtually on 10/22/22 and diagnosed with Viral URI and advised to continue OTC symptomatic management). The problem has been gradually worsening. There has been no fever. Associated symptoms include chest pain (heaviness), congestion, coughing, headaches, rhinorrhea, sinus pain, a sore throat and wheezing (when lying down). Pertinent negatives include no diarrhea, ear pain, nausea, plugged ear sensation, swollen glands or vomiting. Associated symptoms comments: Hoarse voice. Treatments tried: mucinex. The treatment provided no relief.     Problems:  Patient Active Problem List   Diagnosis Date Noted   Generalized obesity 09/22/2022   Headache, unspecified headache type 08/14/2022   Vestibular migraine 08/13/2022   RUQ abdominal pain 08/12/2022   Polyphagia 07/29/2022   Postoperative state 07/06/2022   Fibroids 05/19/2022   Uterine leiomyoma 04/20/2022   Vitamin D deficiency 04/01/2022   Insulin resistance 04/01/2022   Depression 04/01/2022   Malignant neoplasm of right female breast, unspecified estrogen receptor status, unspecified site of breast ([place]) 08/13/2021   Genetic testing  08/20/2020   Family history of breast cancer    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   Bipolar 2 disorder ([place]) 11/16/2019   Prediabetes 08/15/2018   Anxiety 02/06/2018   Low back pain  radiating to right lower extremity 07/06/2017   Upper back pain, chronic 07/06/2017   Hypertension, essential, benign 03/11/2017    Allergies: No Known Allergies Medications:  Current Outpatient Medications:    albuterol (VENTOLIN HFA) 108 (90 Base) MCG/ACT inhaler, Inhale 1-2 puffs into the lungs every 6 (six) hours as needed., Disp: 8 g, Rfl: 0   azithromycin (ZITHROMAX) 250 MG tablet, Take 2 tablets on day 1, then 1 tablet daily on days 2 through 5, Disp: 6 tablet, Rfl: 0   predniSONE (DELTASONE) 20 MG tablet, Take 2 tablets (40 mg total) by mouth daily with breakfast., Disp: 10 tablet, Rfl: 0   promethazine-dextromethorphan (PROMETHAZINE-DM) 6.25-15 MG/5ML syrup, Take 5 mLs by mouth 4 (four) times daily as needed., Disp: 118 mL, Rfl: 0   amLODipine-benazepril (LOTREL) 5-10 MG capsule, TAKE 1 CAPSULE BY MOUTH EVERY DAY, Disp: 90 capsule, Rfl: 2   cetirizine (ZYRTEC ALLERGY) 10 MG tablet, Take 1 tablet (10 mg total) by mouth daily., Disp: 90 tablet, Rfl: 1   cyclobenzaprine (FLEXERIL) 5 MG tablet, Take 1 tablet (5 mg total) by mouth 3 (three) times daily as needed for muscle spasms., Disp: 21 tablet, Rfl: 0   fluticasone (FLONASE) 50 MCG/ACT nasal spray, Place 2 sprays into both nostrils daily., Disp: 16 g, Rfl: 0   gabapentin (NEURONTIN) 100 MG capsule, Take 1 capsule (100 mg total) by mouth 3 (three) times daily., Disp: 90 capsule, Rfl: 3   gabapentin (NEURONTIN) 400 MG capsule, Take 1 capsule (400 mg total) by mouth 3 (three) times daily., Disp: 90 capsule, Rfl: 3   Galcanezumab-gnlm (EMGALITY) 120 MG/ML SOAJ, Inject 120 mg into the skin every 28 (twenty-eight) days., Disp: 1 mL, Rfl: 5   hydrOXYzine (ATARAX) 10 MG tablet, Take 1 tablet (10 mg total) by mouth 3 (three) times daily as needed., Disp: 90 tablet, Rfl: 3   ipratropium (ATROVENT) 0.03 % nasal spray, Place 2 sprays into both nostrils every 12 (twelve) hours., Disp: 30 mL, Rfl: 0   loperamide (IMODIUM A-D) 2 MG tablet, Take 1 tablet  (2 mg total) by mouth 4 (four) times daily as needed for diarrhea or loose stools., Disp: 30 tablet, Rfl: 0   meclizine (ANTIVERT) 25 MG tablet, Take 1 tablet (25 mg total) by mouth 3 (three) times daily as needed for dizziness., Disp: 30 tablet, Rfl: 0   propranolol (INDERAL) 40 MG tablet, TAKE 1/2 TABLET BY MOUTH EVERY DAY, Disp: 45 tablet, Rfl: 1   SUMAtriptan (IMITREX) 50 MG tablet, Take 1 tablet (50 mg total) by mouth as needed for migraine. May repeat in 2 hours if headache persists or recurs.  Maximum 2 tablets in 24 hours., Disp: 10 tablet, Rfl: 5   Topiramate ER (TROKENDI XR) 25 MG CP24, 1 capsule po daily, Disp: 30 capsule, Rfl: 0   Topiramate ER (TROKENDI XR) 50 MG CP24, Take 1 capsule (50 mg total) by mouth daily with supper., Disp: 30 capsule, Rfl: 0   Vitamin D, Ergocalciferol, (DRISDOL) 1.25 MG (50000 UNIT) CAPS capsule, Take 1 capsule (50,000 Units total) by mouth every 7 (seven) days., Disp: 5 capsule, Rfl: 0  Observations/Objective: Patient is well-developed, well-nourished in no acute distress.  Resting comfortably at home.  Head is normocephalic, atraumatic.  No labored breathing.  Speech is clear and coherent with logical content.  Patient is alert and oriented at baseline.    Assessment and Plan: 1. Acute bacterial bronchitis - azithromycin (ZITHROMAX) 250 MG tablet; Take 2 tablets on day 1, then 1 tablet daily on days 2 through 5  Dispense: 6 tablet; Refill: 0 - predniSONE (DELTASONE) 20 MG tablet; Take 2 tablets (40 mg total) by mouth daily with breakfast.  Dispense: 10 tablet; Refill: 0 - albuterol (VENTOLIN HFA) 108 (90 Base) MCG/ACT inhaler; Inhale 1-2 puffs into the lungs every 6 (six) hours as needed.  Dispense: 8 g; Refill: 0 - promethazine-dextromethorphan (PROMETHAZINE-DM) 6.25-15 MG/5ML syrup; Take 5 mLs by mouth 4 (four) times daily as needed.  Dispense: 118 mL; Refill: 0  - Worsening over a week despite OTC medications - Will treat with Z-pack, Prednisone,  Albuterol, and Promethazine DM - Can continue Mucinex  - Push fluids.  - Rest.  - Steam and humidifier can help - Seek in person evaluation if worsening or symptoms fail to improve    Follow Up Instructions: I discussed the assessment and treatment plan with the patient. The patient was provided an opportunity to ask questions and all were answered. The patient agreed with the plan and demonstrated an understanding of the instructions.  A copy of instructions were sent to the patient via MyChart unless otherwise noted below.    The patient was advised to call back or seek an in-person evaluation if the symptoms worsen or if the condition fails to improve as anticipated.  Time:  I spent 10 minutes with the patient via telehealth technology discussing the above problems/concerns.    Mar Daring, PA-C

## 2022-10-25 NOTE — Patient Instructions (Signed)
Sue Jackson, thank you for joining Mar Daring, PA-C for today's virtual visit.  While this provider is not your primary care provider (PCP), if your PCP is located in our provider database this encounter information will be shared with them immediately following your visit.   [facility] account gives you access to today's visit and all your visits, tests, and labs performed at [facility] " click here if you don't have a [facility] account or go to [email or web address]  Consent: (Patient) Sue Jackson provided verbal consent for this virtual visit at the beginning of the encounter.  Current Medications:  Current Outpatient Medications:    albuterol (VENTOLIN HFA) 108 (90 Base) MCG/ACT inhaler, Inhale 1-2 puffs into the lungs every 6 (six) hours as needed., Disp: 8 g, Rfl: 0   azithromycin (ZITHROMAX) 250 MG tablet, Take 2 tablets on day 1, then 1 tablet daily on days 2 through 5, Disp: 6 tablet, Rfl: 0   predniSONE (DELTASONE) 20 MG tablet, Take 2 tablets (40 mg total) by mouth daily with breakfast., Disp: 10 tablet, Rfl: 0   promethazine-dextromethorphan (PROMETHAZINE-DM) 6.25-15 MG/5ML syrup, Take 5 mLs by mouth 4 (four) times daily as needed., Disp: 118 mL, Rfl: 0   amLODipine-benazepril (LOTREL) 5-10 MG capsule, TAKE 1 CAPSULE BY MOUTH EVERY DAY, Disp: 90 capsule, Rfl: 2   cetirizine (ZYRTEC ALLERGY) 10 MG tablet, Take 1 tablet (10 mg total) by mouth daily., Disp: 90 tablet, Rfl: 1   cyclobenzaprine (FLEXERIL) 5 MG tablet, Take 1 tablet (5 mg total) by mouth 3 (three) times daily as needed for muscle spasms., Disp: 21 tablet, Rfl: 0   fluticasone (FLONASE) 50 MCG/ACT nasal spray, Place 2 sprays into both nostrils daily., Disp: 16 g, Rfl: 0   gabapentin (NEURONTIN) 100 MG capsule, Take 1 capsule (100 mg total) by mouth 3 (three) times daily., Disp: 90 capsule, Rfl: 3   gabapentin (NEURONTIN) 400 MG capsule, Take 1 capsule  (400 mg total) by mouth 3 (three) times daily., Disp: 90 capsule, Rfl: 3   Galcanezumab-gnlm (EMGALITY) 120 MG/ML SOAJ, Inject 120 mg into the skin every 28 (twenty-eight) days., Disp: 1 mL, Rfl: 5   hydrOXYzine (ATARAX) 10 MG tablet, Take 1 tablet (10 mg total) by mouth 3 (three) times daily as needed., Disp: 90 tablet, Rfl: 3   ipratropium (ATROVENT) 0.03 % nasal spray, Place 2 sprays into both nostrils every 12 (twelve) hours., Disp: 30 mL, Rfl: 0   loperamide (IMODIUM A-D) 2 MG tablet, Take 1 tablet (2 mg total) by mouth 4 (four) times daily as needed for diarrhea or loose stools., Disp: 30 tablet, Rfl: 0   meclizine (ANTIVERT) 25 MG tablet, Take 1 tablet (25 mg total) by mouth 3 (three) times daily as needed for dizziness., Disp: 30 tablet, Rfl: 0   propranolol (INDERAL) 40 MG tablet, TAKE 1/2 TABLET BY MOUTH EVERY DAY, Disp: 45 tablet, Rfl: 1   SUMAtriptan (IMITREX) 50 MG tablet, Take 1 tablet (50 mg total) by mouth as needed for migraine. May repeat in 2 hours if headache persists or recurs.  Maximum 2 tablets in 24 hours., Disp: 10 tablet, Rfl: 5   Topiramate ER (TROKENDI XR) 25 MG CP24, 1 capsule po daily, Disp: 30 capsule, Rfl: 0   Topiramate ER (TROKENDI XR) 50 MG CP24, Take 1 capsule (50 mg total) by mouth daily with supper., Disp: 30 capsule, Rfl: 0   Vitamin D, Ergocalciferol, (DRISDOL) 1.25 MG (50000 UNIT) CAPS capsule, Take  1 capsule (50,000 Units total) by mouth every 7 (seven) days., Disp: 5 capsule, Rfl: 0   Medications ordered in this encounter:  Meds ordered this encounter  Medications   azithromycin (ZITHROMAX) 250 MG tablet    Sig: Take 2 tablets on day 1, then 1 tablet daily on days 2 through 5    Dispense:  6 tablet    Refill:  0    Order Specific Question:   Supervising Provider    Answer:   [name] [id number]   predniSONE (DELTASONE) 20 MG tablet    Sig: Take 2 tablets (40 mg total) by mouth daily with breakfast.    Dispense:  10 tablet    Refill:  0     Order Specific Question:   Supervising Provider    Answer:   [name] [id number]   albuterol (VENTOLIN HFA) 108 (90 Base) MCG/ACT inhaler    Sig: Inhale 1-2 puffs into the lungs every 6 (six) hours as needed.    Dispense:  8 g    Refill:  0    Order Specific Question:   Supervising Provider    Answer:   [name] [id number]   promethazine-dextromethorphan (PROMETHAZINE-DM) 6.25-15 MG/5ML syrup    Sig: Take 5 mLs by mouth 4 (four) times daily as needed.    Dispense:  118 mL    Refill:  0    Order Specific Question:   Supervising Provider    Answer:   [name] [id number]     *If you need refills on other medications prior to your next appointment, please contact your pharmacy*  Follow-Up: Call back or seek an in-person evaluation if the symptoms worsen or if the condition fails to improve as anticipated.  [place] [phone]  Other Instructions  Acute Bronchitis, Adult  Acute bronchitis is sudden inflammation of the main airways (bronchi) that come off the windpipe (trachea) in the lungs. The swelling causes the airways to get smaller and make more mucus than normal. This can make it hard to breathe and can cause coughing or noisy breathing (wheezing). Acute bronchitis may last several weeks. The cough may last longer. Allergies, asthma, and exposure to smoke may make the condition worse. What are the causes? This condition can be caused by germs and by substances that irritate the lungs, including: Cold and flu viruses. The most common cause of this condition is the virus that causes the common cold. Bacteria. This is less common. Breathing in substances that irritate the lungs, including: Smoke from cigarettes and other forms of tobacco. Dust and pollen. Fumes from household cleaning products, gases, or burned fuel. Indoor or outdoor air pollution. What increases the risk? The following factors may make you more likely to develop this  condition: A weak body's defense system, also called the immune system. A condition that affects your lungs and breathing, such as asthma. What are the signs or symptoms? Common symptoms of this condition include: Coughing. This may bring up clear, yellow, or green mucus from your lungs (sputum). Wheezing. Runny or stuffy nose. Having too much mucus in your lungs (chest congestion). Shortness of breath. Aches and pains, including sore throat or chest. How is this diagnosed? This condition is usually diagnosed based on: Your symptoms and medical history. A physical exam. You may also have other tests, including tests to rule out other conditions, such as pneumonia. These tests include: A test of lung function. Test of a mucus sample to  look for the presence of bacteria. Tests to check the oxygen level in your blood. Blood tests. Chest X-ray. How is this treated? Most cases of acute bronchitis clear up over time without treatment. Your health care provider may recommend: Drinking more fluids to help thin your mucus so it is easier to cough up. Taking inhaled medicine (inhaler) to improve air flow in and out of your lungs. Using a vaporizer or a humidifier. These are machines that add water to the air to help you breathe better. Taking a medicine that thins mucus and clears congestion (expectorant). Taking a medicine that prevents or stops coughing (cough suppressant). It is not common to take an antibiotic medicine for this condition. Follow these instructions at home:  Take over-the-counter and prescription medicines only as told by your health care provider. Use an inhaler, vaporizer, or humidifier as told by your health care provider. Take two teaspoons (10 mL) of honey at bedtime to lessen coughing at night. Drink enough fluid to keep your urine pale yellow. Do not use any products that contain nicotine or tobacco. These products include cigarettes, chewing tobacco, and vaping  devices, such as e-cigarettes. If you need help quitting, ask your health care provider. Get plenty of rest. Return to your normal activities as told by your health care provider. Ask your health care provider what activities are safe for you. Keep all follow-up visits. This is important. How is this prevented? To lower your risk of getting this condition again: Wash your hands often with soap and water for at least 20 seconds. If soap and water are not available, use hand sanitizer. Avoid contact with people who have cold symptoms. Try not to touch your mouth, nose, or eyes with your hands. Avoid breathing in smoke or chemical fumes. Breathing smoke or chemical fumes will make your condition worse. Get the flu shot every year. Contact a health care provider if: Your symptoms do not improve after 2 weeks. You have trouble coughing up the mucus. Your cough keeps you awake at night. You have a fever. Get help right away if you: Cough up blood. Feel pain in your chest. Have severe shortness of breath. Faint or keep feeling like you are going to faint. Have a severe headache. Have a fever or chills that get worse. These symptoms may represent a serious problem that is an emergency. Do not wait to see if the symptoms will go away. Get medical help right away. Call your local emergency services (911 in the [place] Do not drive yourself to the hospital. Summary Acute bronchitis is inflammation of the main airways (bronchi) that come off the windpipe (trachea) in the lungs. The swelling causes the airways to get smaller and make more mucus than normal. Drinking more fluids can help thin your mucus so it is easier to cough up. Take over-the-counter and prescription medicines only as told by your health care provider. Do not use any products that contain nicotine or tobacco. These products include cigarettes, chewing tobacco, and vaping devices, such as e-cigarettes. If you need help quitting, ask  your health care provider. Contact a health care provider if your symptoms do not improve after 2 weeks. This information is not intended to replace advice given to you by your health care provider. Make sure you discuss any questions you have with your health care provider. Document Revised: 11/05/2021 Document Reviewed: 11/26/2020 Elsevier Patient Education  [place].    If you have been instructed to have an in-person  evaluation today at a local Urgent Care facility, please use the link below. It will take you to a list of all of our available [facility], including address, phone number and hours of operation. Please do not delay care.  [facility]  If you or a family member do not have a primary care provider, use the link below to schedule a visit and establish care. When you choose a [facility] primary care physician or advanced practice provider, you gain a long-term partner in health. Find a Primary Care Provider  Learn more about [facility]'s in-office and virtual care options: [facility]

## 2022-10-27 ENCOUNTER — Ambulatory Visit (INDEPENDENT_AMBULATORY_CARE_PROVIDER_SITE_OTHER): Payer: 59 | Admitting: Clinical

## 2022-10-27 DIAGNOSIS — F3181 Bipolar II disorder: Secondary | ICD-10-CM

## 2022-10-27 NOTE — Progress Notes (Signed)
THERAPIST PROGRESS NOTE Virtual Visit via Video Note  I connected with Sue Jackson on 10/27/2022 at  1:00 PM EDT by a video enabled telemedicine application and verified that I am speaking with the correct person using two identifiers.  Location: Patient: home Provider: office   I discussed the limitations of evaluation and management by telemedicine and the availability of in person appointments. The patient expressed understanding and agreed to proceed.   Follow Up Instructions: I discussed the assessment and treatment plan with the patient. The patient was provided an opportunity to ask questions and all were answered. The patient agreed with the plan and demonstrated an understanding of the instructions.   The patient was advised to call back or seek an in-person evaluation if the symptoms worsen or if the condition fails to improve as anticipated.   Session Time: 30 minutes  Participation Level: Active  Behavioral Response: CasualAlertDepressed  Type of Therapy: Individual Therapy  Treatment Goals addressed: client will complete 80% of assigned homework  ProgressTowards Goals: Progressing  Interventions: CBT and Supportive  Summary:  Sue Jackson is a 42 y.o. female who presents for the scheduled appointment oriented x 5, appropriately dressed, and friendly.  Client denied hallucinations and delusions. Client reported on today she has been experiencing a lot of stress.  Client reported as previously noted they have recently purchased a home and are working on moving in and making all the necessary improvements.  Client reported she has a lot of people over at her house today making installments.  Client reported she has a lot of things to get done and she has not been feeling well.  Client reported she experiences constant fatigue/feeling tired.  Client reported she often loses lack of motivation to do things also because of her health.  Client  reported with managing her prediabetes and vertigo spells it can be hard to keep up and she feels overwhelmed.  Client reported she has not gone to the gym in a while and is looking to set up her own little home gym.  Client reported she thinks that would be helpful and more convenient for her especially during times when she needs to release tension.  Client reported work is also very stressful place that contributes to her lack of motivation.  Client reported her health of Dr. Inquired about her possibly switching from gabapentin because it does have a side effect of possible weight gain as she is on a regimen of eating to help her lose weight. Evidence of progress towards goal: Client reported 1 goal of setting up a home gym so she can work out at least 3 times per week.   Suicidal/Homicidal: Nowithout intent/plan  Therapist Response:  Therapist began the appointment asking the client how she has been doing since last seen. Therapist used CBT to engage using active listening and positive emotional support. Therapist used CBT to engage and give the client time to discuss her thoughts and feelings. Therapist used CBT to normalize the clients emotions and encourage her to utilize positive outlets to relieve stress and irritability.  Therapist used CBT ask the client to identify her progress with frequency of use with coping skills with continued practice in her daily activity.    Therapist assigned the client homework to practice self care.   Plan: Return again in 3 weeks.  Diagnosis: bipolar 2 disorder  Collaboration of Care: Patient refused AEB none requested by the client.  Patient/Guardian was advised Release of Information must  be obtained prior to any record release in order to collaborate their care with an outside provider. Patient/Guardian was advised if they have not already done so to contact the registration department to sign all necessary forms in order for [place] to release  information regarding their care.   Consent: Patient/Guardian gives verbal consent for treatment and assignment of benefits for services provided during this visit. Patient/Guardian expressed understanding and agreed to proceed.   [place], LCSW 10/27/2022

## 2022-11-03 ENCOUNTER — Telehealth (HOSPITAL_COMMUNITY): Admitting: Psychiatry

## 2022-11-03 ENCOUNTER — Encounter (HOSPITAL_COMMUNITY): Payer: Self-pay

## 2022-11-09 NOTE — Progress Notes (Signed)
  TeleHealth Visit:  This visit was completed with telemedicine (audio/video) technology. Sue Jackson has verbally consented to this TeleHealth visit. The patient is located at home, the provider is located at home. The participants in this visit include the listed provider and patient. The visit was conducted today via MyChart video.  OBESITY Sue Jackson is here to discuss her progress with her obesity treatment plan along with follow-up of her obesity related diagnoses.   Today's visit was # 13 Starting weight: 229 lbs Starting date: 03/18/22 Weight at last in office visit: 213 lbs on 10/20/22 Total weight loss: 16 lbs at last in office visit on 10/20/22. Today's reported weight (11/10/22): none reported  Nutrition Plan: keeping a food journal with goal of 1800 calories and 110 grams of protein daily.  Current exercise:  Recent move and walking at work .  Interim History:  Journaling sporadically due to recent move. She has been watching what she eats. She is eating protein at all meals. Doesn't skip meals. She feels that she has vertigo when she doesn't get enough protein.  She and her wife cook at home.  She is setting up her punching bag to begin exercise- she can do pull-ups, pushups, dips with her punching bag set up.  Eating all of the prescribed protein: yes Skipping meals: No Drinking adequate water: Yes Drinking sugar sweetened beverages: No Hunger controlled: well controlled. Cravings controlled:  moderately controlled. Journaling Consistently:  No  Assessment/Plan:  1. Polyphagia Trokendi dose increased to 50 mg last office visit.  She denies side effects and has less polyphagia.  Plan: Continue Trokendi XR 50 mg daily.   2. Generalized Obesity: Current BMI 33 Sue Jackson is currently in the action stage of change. As such, her goal is to continue with weight loss efforts.  She has agreed to keeping a food journal with goal of 1800 calories and 110 grams of protein  daily.  1.  Resume journaling as soon as possible. 2.  Begin working out with punching bag apparatus.  Exercise goals: use punching bag 2-3 times a week before next visit,.  Behavioral modification strategies: increasing lean protein intake, decreasing simple carbohydrates , meal planning , and planning for success.  Sue Jackson has agreed to follow-up with our clinic in 3 weeks.   No orders of the defined types were placed in this encounter.   There are no discontinued medications.   No orders of the defined types were placed in this encounter.     Objective:   VITALS: Per patient if applicable, see vitals. GENERAL: Alert and in no acute distress. CARDIOPULMONARY: No increased WOB. Speaking in clear sentences.  PSYCH: Pleasant and cooperative. Speech normal rate and rhythm. Affect is appropriate. Insight and judgement are appropriate. Attention is focused, linear, and appropriate.  NEURO: Oriented as arrived to appointment on time with no prompting.   Attestation Statements:   Reviewed by clinician on day of visit: allergies, medications, problem list, medical history, surgical history, family history, social history, and previous encounter notes.  This was prepared with the assistance of Presenter, broadcasting.  Occasional wrong-word or sound-a-like substitutions may have occurred due to the inherent limitations of voice recognition software.

## 2022-11-10 ENCOUNTER — Encounter (HOSPITAL_COMMUNITY): Payer: Self-pay

## 2022-11-10 ENCOUNTER — Ambulatory Visit (INDEPENDENT_AMBULATORY_CARE_PROVIDER_SITE_OTHER): Payer: 59 | Admitting: Clinical

## 2022-11-10 ENCOUNTER — Encounter (INDEPENDENT_AMBULATORY_CARE_PROVIDER_SITE_OTHER): Payer: Self-pay | Admitting: Family Medicine

## 2022-11-10 ENCOUNTER — Telehealth (INDEPENDENT_AMBULATORY_CARE_PROVIDER_SITE_OTHER): Payer: 59 | Admitting: Family Medicine

## 2022-11-10 DIAGNOSIS — Z6833 Body mass index (BMI) 33.0-33.9, adult: Secondary | ICD-10-CM | POA: Diagnosis not present

## 2022-11-10 DIAGNOSIS — R632 Polyphagia: Secondary | ICD-10-CM | POA: Diagnosis not present

## 2022-11-10 DIAGNOSIS — F3181 Bipolar II disorder: Secondary | ICD-10-CM

## 2022-11-10 DIAGNOSIS — E669 Obesity, unspecified: Secondary | ICD-10-CM

## 2022-11-10 NOTE — Progress Notes (Signed)
THERAPIST PROGRESS NOTE Virtual Visit via Video Note  I connected with Sue Jackson on 11/10/22 at 11:00 AM EDT by a video enabled telemedicine application and verified that I am speaking with the correct person using two identifiers.  Location: Patient: home Provider: office  I discussed the limitations of evaluation and management by telemedicine and the availability of in person appointments. The patient expressed understanding and agreed to proceed.    Follow Up Instructions: I discussed the assessment and treatment plan with the patient. The patient was provided an opportunity to ask questions and all were answered. The patient agreed with the plan and demonstrated an understanding of the instructions.   The patient was advised to call back or seek an in-person evaluation if the symptoms worsen or if the condition fails to improve as anticipated.   Session Time: 60 minutes  Participation Level: Active  Behavioral Response: CasualAlertIrritable  Type of Therapy: Individual Therapy  Treatment Goals addressed: Client will practice problem-solving skills 3 times per week for the next 4 weeks  ProgressTowards Goals: Progressing  Interventions: CBT and Supportive  Summary:  Sue Jackson is a 42 y.o. female who presents for the scheduled appointment oriented x 5, appropriately dressed, and friendly.  Client denied hallucinations and delusions. Client reported on today she has been dealing with a lot of anxiety and stress related to the family dynamic.  Client reported her wife's sister's have both step past boundaries regarding their marriage and household that she is not comfortable with.  Client reported she has gotten to the point of being more assertive in her communication and vocalizing her boundaries with her wife's family.  Client reported she sees tendencies of trying to manipulate her wife into giving them money and or for other reasons.  Client  reported she has tried to discuss with her wife how it puts a strain on their marriage but her wife is not seeing the complete issue that she is trying to point out to her.  Client reported on top of the other things that she is trying to collectively get organized it adds to her anxiety daily.  Client reported it affects her to the point where she does not go to work on some days and is following up on FMLA paperwork with her place of employment.  Client reported she wanted to discuss if she is wrong for standing her ground and not tolerating disrespectful comments and/or inappropriate ask's from her wife's family and the works of discontinuing to enable behaviors that have been going on since before they were married. Evidence of progress towards goal: Client reported 1 positive is that she is following through with assertive communication appropriately when handling situations that she is not comfortable with.  Suicidal/Homicidal: Nowithout intent/plan  Therapist Response:  Therapist began the appointment asking the client how she has been doing since last seen. Therapist used CBT to engage using active listening and positive emotional support towards her thoughts and feelings. Therapist used CBT to get the client time to fully discuss and describe her thoughts and feelings regarding issues within the family dynamic in her marriage. Therapist used CBT to normalize the clients emotional response to the stressors within reason. Therapist used CBT to reinforce the clients use of appropriate communication skills such as being assertive when vocalizing her boundaries. Therapist used CBT ask the client to identify her progress with frequency of use with coping skills with continued practice in her daily activity.    Therapist assigned  client homework to practice self-care and stress management techniques.    Plan: Return again in 3 weeks.  Diagnosis: Bipolar 2 disorder  Collaboration of Care:  Patient refused AEB none requested by the client.  Patient/Guardian was advised Release of Information must be obtained prior to any record release in order to collaborate their care with an outside provider. Patient/Guardian was advised if they have not already done so to contact the registration department to sign all necessary forms in order for [place] to release information regarding their care.   Consent: Patient/Guardian gives verbal consent for treatment and assignment of benefits for services provided during this visit. Patient/Guardian expressed understanding and agreed to proceed.   [place], LCSW 11/10/2022

## 2022-11-11 ENCOUNTER — Telehealth (INDEPENDENT_AMBULATORY_CARE_PROVIDER_SITE_OTHER): Payer: 59 | Admitting: Psychiatry

## 2022-11-11 ENCOUNTER — Encounter (HOSPITAL_COMMUNITY): Payer: Self-pay | Admitting: Psychiatry

## 2022-11-11 DIAGNOSIS — F419 Anxiety disorder, unspecified: Secondary | ICD-10-CM

## 2022-11-11 DIAGNOSIS — F3181 Bipolar II disorder: Secondary | ICD-10-CM

## 2022-11-11 MED ORDER — GABAPENTIN 400 MG PO CAPS
400.0000 mg | ORAL_CAPSULE | Freq: Three times a day (TID) | ORAL | 3 refills | Status: DC
Start: 2022-11-11 — End: 2023-01-19

## 2022-11-11 MED ORDER — HYDROXYZINE HCL 10 MG PO TABS: 10.0000 mg | ORAL_TABLET | Freq: Three times a day (TID) | ORAL | 3 refills | Status: DC | PRN

## 2022-11-11 MED ORDER — GABAPENTIN 100 MG PO CAPS: 100.0000 mg | ORAL_CAPSULE | Freq: Three times a day (TID) | ORAL | 3 refills | Status: DC

## 2022-11-11 NOTE — Progress Notes (Signed)
[facility] MD/PA/NP OP Progress Note Virtual Visit via Video Note  I connected with Sue Jackson on 11/11/22 at  2:00 PM EDT by a video enabled telemedicine application and verified that I am speaking with the correct person using two identifiers.  Location: Patient: Home Provider: Clinic   I discussed the limitations of evaluation and management by telemedicine and the availability of in person appointments. The patient expressed understanding and agreed to proceed.  I provided 30 5 minutes of non-face-to-face time during this encounter.      11/11/2022 3:05 PM [name]  MRN:  [id number]  Chief Complaint:  "Things have improved a little"  HPI: 42 year old female seen today for follow-up psychiatric evaluation. She has a psychiatric history of bipolar disorder, SI, depression, and anxiety.  She is currently managed on Gabapentin 500 mg three times daily and hydroxyzine 10 mg 3 times daily as needed. She notes her medication is somewhat effective in managing her psychiatric conditions.   Today patient was well groomed, pleasant, cooperative, and engaged in conversation. She informed Probation officer that things have improved a little.  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  Patient informed Probation officer that her weight loss provider informed her that gabapentin can increase weight.  She asked Probation officer for other medications to control anxiety and mood.  Provider discussed Lamictal and antidepressants such as Prozac (patient dislikes Zoloft and has attempted to OD on Effexor).  She informed that she would research Lamictal and considered an antidepressants in the future.  At this time however she reports that she will continue on gabapentin.  Patient also informed writer that she takes hydroxyzine as needed and asked provider how that him to ask with Topamax which she uses for weight loss.  Provider informed  patient that Topamax and hydroxyzine could increase sweating, dehydration, and potentially cause a heatstroke.  She notes that she has been experiencing increased sweating however reports that hydroxyzine is effective for her.  She reports that she wishes to continue and notes that she attempts to take Topamax and hydroxyzine for at least 5 hours apart from each other.  Provider endorsed understanding.    Patient informed Probation officer that she worries about her health, her finances, her new home (reports recently purchased a home), and her family.  Provider conducted a GAD-7 and patient scored a 20, at her last visit she scored a 21.  Provider also conducted PHQ-9 and  she scored a 15, at her last visit she scored a 19.  She endorses adequate sleep and appetite.  Today she endorses fluctuations in mood, irritability, distractibility, and racing thoughts.  She denies SI/HI/VAH or paranoia.    Today provider offered a mood stabilize (Lamictal), antidepressant (Prozac), or antipsychotic to help manage depressive episode however patient was not agreeable.  She informed Probation officer that she would research Lamictal and consider it at her next visit.  Provider discussed risk and benefits of being on hydroxyzine and Topamax.  She endorsed understanding and notes that she will take the medications at least 5 hours apart.  She will continue all other medications as prescribed.  No other concerns noted at this time.      Visit Diagnosis:    ICD-10-CM   1. Anxiety  F41.9 hydrOXYzine (ATARAX) 10 MG tablet    gabapentin (NEURONTIN) 100 MG capsule    gabapentin (NEURONTIN) 400 MG capsule    2. Bipolar 2 disorder  F31.81  gabapentin (NEURONTIN) 100 MG capsule    gabapentin (NEURONTIN) 400 MG capsule      Past Psychiatric History: : Bipolar depression, anxiety.  History of 1 prior psychiatry hospitaliation in 2013 after overdosing on Effexor. Past Medical History:  Past Medical History:  Diagnosis Date   Anemia  2019-2020   Anxiety    Arthritis    Bipolar disorder 2013   Cancer 2021   Depression    Family history of breast cancer    Headache 2021   Hypertension    Personal history of radiation therapy    Pre-diabetes     Past Surgical History:  Procedure Laterality Date   BREAST BIOPSY Right 04/09/2020   BREAST BIOPSY Left 04/24/2020   x2   BREAST LUMPECTOMY Right 06/12/2020   BREAST LUMPECTOMY WITH RADIOACTIVE SEED LOCALIZATION Right 06/12/2020   Procedure: RIGHT BREAST LUMPECTOMY WITH RADIOACTIVE SEED LOCALIZATION;  Surgeon: Coralie Keens, MD;  Location: [place];  Service: General;  Laterality: Right;  LMA   ROBOTIC ASSISTED LAPAROSCOPIC LYSIS OF ADHESION  07/06/2022   Procedure: XI ROBOTIC ASSISTED LAPAROSCOPIC LYSIS OF ADHESION;  Surgeon: [name], MD;  Location: [place];  Service: Gynecology;;   ROBOTIC ASSISTED LAPAROSCOPIC OVARIAN CYSTECTOMY Right 07/06/2022   Procedure: XI ROBOTIC ASSISTED LAPAROSCOPIC right OVARIAN CYSTECTOMY with peritoneal washings;  Surgeon: [name], MD;  Location: [place];  Service: Gynecology;  Laterality: Right;    Family Psychiatric History: Maternal aunt- depression  Family History:  Family History  Problem Relation Age of Onset   Arthritis Mother    Asthma Mother    Cirrhosis Father        d. 71   Depression Maternal Aunt    Hypertension Maternal Aunt    Breast cancer Maternal Aunt 51   Breast cancer Maternal Aunt 59   Breast cancer Paternal Aunt    Dementia Maternal Grandmother    Diabetes Maternal Grandfather    Heart disease Maternal Grandfather    Hypertension Maternal Grandfather    Stroke Maternal Grandfather    Breast cancer Cousin        pat first cousin    Social History:  Social History   Socioeconomic History   Marital status: Married    Spouse name: Not on file   Number of children: 0   Years of education: Not on file   Highest education level: Not on file   Occupational History   Not on file  Tobacco Use   Smoking status: Former    Types: E-cigarettes   Smokeless tobacco: Never   Tobacco comments:    VAPES  Vaping Use   Vaping Use: Every day   Substances: CBD  Substance and Sexual Activity   Alcohol use: Yes    Comment: occasionally   Drug use: Yes    Types: Marijuana   Sexual activity: Yes    Partners: Female  Other Topics Concern   Not on file  Social History Narrative   Right handed   Social Determinants of Health   Financial Resource Strain: Low Risk  (04/16/2020)   Overall Financial Resource Strain (CARDIA)    Difficulty of Paying Living Expenses: Not very hard  Food Insecurity: No Food Insecurity (04/16/2020)   Hunger Vital Sign    Worried About Running Out of Food in the Last Year: Never true    [place] in the Last Year: Never true  Transportation Needs: No Transportation Needs (04/16/2020)   [place] - Transportation  Lack of Transportation (Medical): No    Lack of Transportation (Non-Medical): No  Physical Activity: Not on file  Stress: Not on file  Social Connections: Not on file    Allergies: No Known Allergies  Metabolic Disorder Labs: Lab Results  Component Value Date   HGBA1C 6.3 (H) 06/16/2022   No results found for: "PROLACTIN" Lab Results  Component Value Date   CHOL 154 03/18/2022   TRIG 65 03/18/2022   HDL 52 03/18/2022   CHOLHDL 3.0 03/18/2022   VLDL 12.4 01/30/2020   LDLCALC 89 03/18/2022   LDLCALC 76 01/30/2020   Lab Results  Component Value Date   TSH 1.810 03/18/2022   TSH 2.51 11/21/2018    Therapeutic Level Labs: No results found for: "LITHIUM" No results found for: "VALPROATE" No results found for: "CBMZ"  Current Medications: Current Outpatient Medications  Medication Sig Dispense Refill   albuterol (VENTOLIN HFA) 108 (90 Base) MCG/ACT inhaler Inhale 1-2 puffs into the lungs every 6 (six) hours as needed. 8 g 0   amLODipine-benazepril (LOTREL) 5-10 MG capsule TAKE  1 CAPSULE BY MOUTH EVERY DAY 90 capsule 2   cetirizine (ZYRTEC ALLERGY) 10 MG tablet Take 1 tablet (10 mg total) by mouth daily. 90 tablet 1   cyclobenzaprine (FLEXERIL) 5 MG tablet Take 1 tablet (5 mg total) by mouth 3 (three) times daily as needed for muscle spasms. 21 tablet 0   fluticasone (FLONASE) 50 MCG/ACT nasal spray Place 2 sprays into both nostrils daily. 16 g 0   gabapentin (NEURONTIN) 100 MG capsule Take 1 capsule (100 mg total) by mouth 3 (three) times daily. 90 capsule 3   gabapentin (NEURONTIN) 400 MG capsule Take 1 capsule (400 mg total) by mouth 3 (three) times daily. 90 capsule 3   Galcanezumab-gnlm (EMGALITY) 120 MG/ML SOAJ Inject 120 mg into the skin every 28 (twenty-eight) days. 1 mL 5   hydrOXYzine (ATARAX) 10 MG tablet Take 1 tablet (10 mg total) by mouth 3 (three) times daily as needed. 90 tablet 3   ipratropium (ATROVENT) 0.03 % nasal spray Place 2 sprays into both nostrils every 12 (twelve) hours. 30 mL 0   loperamide (IMODIUM A-D) 2 MG tablet Take 1 tablet (2 mg total) by mouth 4 (four) times daily as needed for diarrhea or loose stools. 30 tablet 0   meclizine (ANTIVERT) 25 MG tablet Take 1 tablet (25 mg total) by mouth 3 (three) times daily as needed for dizziness. 30 tablet 0   predniSONE (DELTASONE) 20 MG tablet Take 2 tablets (40 mg total) by mouth daily with breakfast. 10 tablet 0   promethazine-dextromethorphan (PROMETHAZINE-DM) 6.25-15 MG/5ML syrup Take 5 mLs by mouth 4 (four) times daily as needed. 118 mL 0   propranolol (INDERAL) 40 MG tablet TAKE 1/2 TABLET BY MOUTH EVERY DAY 45 tablet 1   SUMAtriptan (IMITREX) 50 MG tablet Take 1 tablet (50 mg total) by mouth as needed for migraine. May repeat in 2 hours if headache persists or recurs.  Maximum 2 tablets in 24 hours. 10 tablet 5   Topiramate ER (TROKENDI XR) 25 MG CP24 1 capsule po daily 30 capsule 0   Topiramate ER (TROKENDI XR) 50 MG CP24 Take 1 capsule (50 mg total) by mouth daily with supper. 30 capsule 0    Vitamin D, Ergocalciferol, (DRISDOL) 1.25 MG (50000 UNIT) CAPS capsule Take 1 capsule (50,000 Units total) by mouth every 7 (seven) days. 5 capsule 0   No current facility-administered medications for this visit.  Musculoskeletal: Strength & Muscle Tone: within normal limits and Telehealth visit Gait & Station: normal, Telehealth visit Patient leans: N/A  Psychiatric Specialty Exam: Review of Systems  There were no vitals taken for this visit.There is no height or weight on file to calculate BMI.  General Appearance: Well groomed  Eye Contact:  Good  Speech:  Clear and Coherent and Normal Rate  Volume:  Normal  Mood:  Anxious and Depressed  Affect:  Appropriate and Congruent  Thought Process:  Coherent, Goal Directed, and Linear  Orientation:  Full (Time, Place, and Person)  Thought Content: WDL and Logical   Suicidal Thoughts:  Yes.  without intent/plan  Homicidal Thoughts:  No  Memory:  Immediate;   Good Recent;   Good Remote;   Good  Judgement:  Good  Insight:  Good  Psychomotor Activity:  Normal  Concentration:  Concentration: Good and Attention Span: Good  Recall:  Good  Fund of Knowledge: Good  Language: Good  Akathisia:  No  Handed:  Right  AIMS (if indicated): not done  Assets:  Communication Skills Desire for Improvement Financial Resources/Insurance Housing Intimacy Physical Health Social Support  ADL's:  Intact  Cognition: WNL  Sleep:  Good   Screenings: GAD-7    Flowsheet Row Video Visit from 11/11/2022 in [facility] Video Visit from 08/18/2022 in [facility] Video Visit from 05/06/2022 in [facility] Video Visit from 02/02/2022 in [facility] Video Visit from 10/01/2021 in [facility]  Total GAD-7 Score 20 21 16 11 9       PHQ2-9    Flowsheet Row Video Visit from 11/11/2022 in [facility] Video Visit from 08/18/2022 in [facility] Video Visit from 05/06/2022 in [facility] Office Visit from 03/18/2022 in [facility] [place] from 03/17/2022 in [place] [facility] [place]  PHQ-2 Total Score 5 6 5 6 5   PHQ-9 Total Score 15 19 13 13 17       Flowsheet Row Video Visit from 11/11/2022 in [facility] Video Visit from 08/18/2022 in [facility] Admission (Discharged) from 07/06/2022 in [place] Error: Q7 should not be populated when Q6 is No Error: Q7 should not be populated when Q6 is No No Risk (P)         Assessment and Plan: Patient endorses increased anxiety, depression, hypomania and SI. Today provider offered a mood stabilize (Lamictal), antidepressant (Prozac), or antipsychotic to help manage depressive episode however patient was not agreeable.  She informed Probation officer that she would research Lamictal and consider it at her next visit.  Provider discussed risk and benefits of being on hydroxyzine and Topamax.  She endorsed understanding and notes that she will take the medications at least 5 hours apart.  She will continue all other medications as prescribed.   1. Anxiety  Continue- hydrOXYzine (ATARAX) 10 MG tablet; Take 1 tablet (10 mg total) by mouth 3 (three) times daily as needed.  Dispense: 90 tablet; Refill: 3 Continue- gabapentin (NEURONTIN) 100 MG capsule; Take 1 capsule (100 mg total) by mouth 3 (three) times daily.  Dispense: 90 capsule; Refill: 3 Continue- gabapentin (NEURONTIN) 400 MG capsule; Take 1 capsule (400 mg total) by mouth 3 (three) times daily.  Dispense: 90 capsule; Refill: 3  2. Bipolar 2 disorder  Continue-  gabapentin (NEURONTIN) 100 MG capsule; Take 1 capsule (100 mg total) by mouth 3 (three) times daily.  Dispense: 90 capsule; Refill: 3 Continue-  gabapentin (NEURONTIN) 400 MG capsule; Take 1 capsule (400 mg total) by mouth 3 (three) times daily.  Dispense: 90 capsule; Refill: 3    Follow-up in 3 months Follow-up with therapy  Salley Slaughter, NP 11/11/2022, 3:05 PM

## 2022-11-12 ENCOUNTER — Telehealth: Payer: 59 | Admitting: Family Medicine

## 2022-11-12 ENCOUNTER — Encounter: Payer: Self-pay | Admitting: Family Medicine

## 2022-11-12 DIAGNOSIS — N920 Excessive and frequent menstruation with regular cycle: Secondary | ICD-10-CM

## 2022-11-12 MED ORDER — IBUPROFEN 600 MG PO TABS: 600.0000 mg | ORAL_TABLET | Freq: Three times a day (TID) | ORAL | 0 refills | Status: DC | PRN

## 2022-11-12 NOTE — Progress Notes (Signed)
Virtual Visit Consent   Sue Jackson, you are scheduled for a virtual visit with a [facility] provider today. Just as with appointments in the office, your consent must be obtained to participate. Your consent will be active for this visit and any virtual visit you may have with one of our providers in the next 365 days. If you have a MyChart account, a copy of this consent can be sent to you electronically.  As this is a virtual visit, video technology does not allow for your provider to perform a traditional examination. This may limit your provider's ability to fully assess your condition. If your provider identifies any concerns that need to be evaluated in person or the need to arrange testing (such as labs, EKG, etc.), we will make arrangements to do so. Although advances in technology are sophisticated, we cannot ensure that it will always work on either your end or our end. If the connection with a video visit is poor, the visit may have to be switched to a telephone visit. With either a video or telephone visit, we are not always able to ensure that we have a secure connection.  By engaging in this virtual visit, you consent to the provision of healthcare and authorize for your insurance to be billed (if applicable) for the services provided during this visit. Depending on your insurance coverage, you may receive a charge related to this service.  I need to obtain your verbal consent now. Are you willing to proceed with your visit today? Sue Jackson has provided verbal consent on 11/12/2022 for a virtual visit (video or telephone). Georgana Curio, FNP  Date: 11/12/2022 3:11 PM  Virtual Visit via Video Note   I, Georgana Curio, connected with  Sue Jackson  ([id number], [birth year]) on 11/12/22 at  3:15 PM EDT by a video-enabled telemedicine application and verified that I am speaking with the correct person using two identifiers.  Location: Patient: Virtual Visit  Location Patient: Home Provider: Virtual Visit Location Provider: Home Office   I discussed the limitations of evaluation and management by telemedicine and the availability of in person appointments. The patient expressed understanding and agreed to proceed.    History of Present Illness: Sue Jackson is a 42 y.o. who identifies as a female who was assigned female at birth, and is being seen today for heavy menstrual period. She was not pleased with her last GYN and is trying to find another. She says this is recurrent. She requests ibuprofen and a note OOW.   HPI: HPI  Problems:  Patient Active Problem List   Diagnosis Date Noted   Generalized obesity 09/22/2022   Headache, unspecified headache type 08/14/2022   Vestibular migraine 08/13/2022   RUQ abdominal pain 08/12/2022   Polyphagia 07/29/2022   Postoperative state 07/06/2022   Fibroids 05/19/2022   Uterine leiomyoma 04/20/2022   Vitamin D deficiency 04/01/2022   Insulin resistance 04/01/2022   Depression 04/01/2022   Malignant neoplasm of right female breast, unspecified estrogen receptor status, unspecified site of breast 08/13/2021   Genetic testing 08/20/2020   Family history of breast cancer    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   Bipolar 2 disorder 11/16/2019   Prediabetes 08/15/2018   Anxiety 02/06/2018   Low back pain radiating to right lower extremity 07/06/2017   Upper back pain, chronic 07/06/2017   Hypertension, essential, benign 03/11/2017    Allergies: No Known Allergies Medications:  Current Outpatient Medications:  ibuprofen (ADVIL) 600 MG tablet, Take 1 tablet (600 mg total) by mouth every 8 (eight) hours as needed., Disp: 30 tablet, Rfl: 0   albuterol (VENTOLIN HFA) 108 (90 Base) MCG/ACT inhaler, Inhale 1-2 puffs into the lungs every 6 (six) hours as needed., Disp: 8 g, Rfl: 0   amLODipine-benazepril (LOTREL) 5-10 MG capsule, TAKE 1 CAPSULE BY MOUTH EVERY DAY, Disp:  90 capsule, Rfl: 2   cetirizine (ZYRTEC ALLERGY) 10 MG tablet, Take 1 tablet (10 mg total) by mouth daily., Disp: 90 tablet, Rfl: 1   cyclobenzaprine (FLEXERIL) 5 MG tablet, Take 1 tablet (5 mg total) by mouth 3 (three) times daily as needed for muscle spasms., Disp: 21 tablet, Rfl: 0   fluticasone (FLONASE) 50 MCG/ACT nasal spray, Place 2 sprays into both nostrils daily., Disp: 16 g, Rfl: 0   gabapentin (NEURONTIN) 100 MG capsule, Take 1 capsule (100 mg total) by mouth 3 (three) times daily., Disp: 90 capsule, Rfl: 3   gabapentin (NEURONTIN) 400 MG capsule, Take 1 capsule (400 mg total) by mouth 3 (three) times daily., Disp: 90 capsule, Rfl: 3   Galcanezumab-gnlm (EMGALITY) 120 MG/ML SOAJ, Inject 120 mg into the skin every 28 (twenty-eight) days., Disp: 1 mL, Rfl: 5   hydrOXYzine (ATARAX) 10 MG tablet, Take 1 tablet (10 mg total) by mouth 3 (three) times daily as needed., Disp: 90 tablet, Rfl: 3   ipratropium (ATROVENT) 0.03 % nasal spray, Place 2 sprays into both nostrils every 12 (twelve) hours., Disp: 30 mL, Rfl: 0   loperamide (IMODIUM A-D) 2 MG tablet, Take 1 tablet (2 mg total) by mouth 4 (four) times daily as needed for diarrhea or loose stools., Disp: 30 tablet, Rfl: 0   meclizine (ANTIVERT) 25 MG tablet, Take 1 tablet (25 mg total) by mouth 3 (three) times daily as needed for dizziness., Disp: 30 tablet, Rfl: 0   predniSONE (DELTASONE) 20 MG tablet, Take 2 tablets (40 mg total) by mouth daily with breakfast., Disp: 10 tablet, Rfl: 0   promethazine-dextromethorphan (PROMETHAZINE-DM) 6.25-15 MG/5ML syrup, Take 5 mLs by mouth 4 (four) times daily as needed., Disp: 118 mL, Rfl: 0   propranolol (INDERAL) 40 MG tablet, TAKE 1/2 TABLET BY MOUTH EVERY DAY, Disp: 45 tablet, Rfl: 1   SUMAtriptan (IMITREX) 50 MG tablet, Take 1 tablet (50 mg total) by mouth as needed for migraine. May repeat in 2 hours if headache persists or recurs.  Maximum 2 tablets in 24 hours., Disp: 10 tablet, Rfl: 5   Topiramate  ER (TROKENDI XR) 25 MG CP24, 1 capsule po daily, Disp: 30 capsule, Rfl: 0   Topiramate ER (TROKENDI XR) 50 MG CP24, Take 1 capsule (50 mg total) by mouth daily with supper., Disp: 30 capsule, Rfl: 0   Vitamin D, Ergocalciferol, (DRISDOL) 1.25 MG (50000 UNIT) CAPS capsule, Take 1 capsule (50,000 Units total) by mouth every 7 (seven) days., Disp: 5 capsule, Rfl: 0  Observations/Objective: Patient is well-developed, well-nourished in no acute distress.  Resting comfortably  at home.  Head is normocephalic, atraumatic.  No labored breathing.  Speech is clear and coherent with logical content.  Patient is alert and oriented at baseline.   Assessment and Plan: 1. Menorrhagia with regular cycle  Increase fluids, follow up with GYN. UC if sx worsening  Follow Up Instructions: I discussed the assessment and treatment plan with the patient. The patient was provided an opportunity to ask questions and all were answered. The patient agreed with the plan and demonstrated an understanding  of the instructions.  A copy of instructions were sent to the patient via MyChart unless otherwise noted below.     The patient was advised to call back or seek an in-person evaluation if the symptoms worsen or if the condition fails to improve as anticipated.  Time:  I spent 10 minutes with the patient via telehealth technology discussing the above problems/concerns.    Georgana Curioiane Broedy Osbourne, FNP

## 2022-11-12 NOTE — Patient Instructions (Signed)
Abnormal Uterine Bleeding ? ?Abnormal uterine bleeding is unusual bleeding from the uterus. It includes bleeding after sex, or bleeding or spotting between menstrual periods. It may also include bleeding that is heavier than normal, menstrual periods that last longer than usual, or bleeding that occurs after menopause. ?Abnormal uterine bleeding can affect teenagers, women in their reproductive years, pregnant women, and women who have reached menopause. Common causes of abnormal uterine bleeding include: ?Pregnancy. ?Abnormal growths within the lining of the uterus (polyps). ?Benign tumors or growths in the uterus (fibroids). These are not cancer. ?Infection. ?Cancer. ?Too much or too little of some hormones in the body (hormonal imbalances). ?Any type of abnormal bleeding should be checked by a health care provider. Many cases are minor and simple to treat, but others may be more serious. Treatment will depend on the cause of the bleeding and how severe it is. ?Follow these instructions at home: ?Medicines ?Take over-the-counter and prescription medicines only as told by your health care provider. ?Ask your health care provider about: ?Taking medicines such as aspirin and ibuprofen. These medicines can thin your blood. Do not take these medicines unless your health care provider tells you to take them. ?Taking over-the-counter medicines, vitamins, herbs, and supplements. ?If you were prescribed iron pills, take them as told by your health care provider. Iron pills help to replace iron that your body loses because of this condition. ?Managing constipation ?In cases of severe bleeding, you may be asked to increase your iron intake to treat anemia. Doing this may cause constipation. To prevent or treat constipation, you may need to: ?Drink enough fluid to keep your urine pale yellow. ?Take over-the-counter or prescription medicines. ?Eat foods that are high in fiber, such as beans, whole grains, and fresh fruits and  vegetables. ?Limit foods that are high in fat and processed sugars, such as fried or sweet foods. ?Activity ?Alter your activity to decrease bleeding if you need to change your sanitary pad more than one time every 2 hours: ?Lie in bed with your feet raised (elevated). ?Place a cold pack on your lower abdomen. ?Rest as much as possible until the bleeding stops or slows down. ?General instructions ?Do not use tampons, douche, or have sex until your health care provider says these things are okay. ?Change your sanitary pads often. ?Get regular exams. These include pelvic exams and cervical cancer screenings. ?It is up to you to get the results of any tests that are done. Ask your health care provider, or the department that is doing the tests, when your results will be ready. ?Monitor your condition for any changes. For 2 months, write down: ?When your menstrual period starts. ?When your menstrual period ends. ?When any abnormal vaginal bleeding occurs. ?What problems you notice. ?Keep all follow-up visits. This is important. ?Contact a health care provider if: ?You have bleeding that lasts for more than one week. ?You feel dizzy at times. ?You feel nauseous or you vomit. ?You feel light-headed or weak. ?You notice any other changes that show that your condition is getting worse. ?Get help right away if: ?You faint. ?You have bleeding that soaks through a sanitary pad every hour. ?You have pain in the abdomen. ?You have a fever or chills. ?You become sweaty or weak. ?You pass large blood clots from your vagina. ?These symptoms may represent a serious problem that is an emergency. Do not wait to see if the symptoms will go away. Get medical help right away. Call your local emergency services (  911 in the [place] Do not drive yourself to the hospital. ?Summary ?Abnormal uterine bleeding is unusual bleeding from the uterus. ?Any type of abnormal bleeding should be checked by a health care provider. Many cases are minor and  simple to treat, but others may be more serious. ?Treatment will depend on the cause of the bleeding and how severe it is. ?Get help right away if you faint, you have bleeding that soaks through a sanitary pad every hour, or you pass large blood clots from your vagina. ?This information is not intended to replace advice given to you by your health care provider. Make sure you discuss any questions you have with your health care provider. ?Document Revised: 11/25/2020 Document Reviewed: 11/25/2020 ?Elsevier Patient Education ? 2023 Elsevier Inc. ? ?

## 2022-11-16 ENCOUNTER — Other Ambulatory Visit (INDEPENDENT_AMBULATORY_CARE_PROVIDER_SITE_OTHER): Payer: Self-pay | Admitting: Family Medicine

## 2022-11-16 DIAGNOSIS — R632 Polyphagia: Secondary | ICD-10-CM

## 2022-11-26 ENCOUNTER — Other Ambulatory Visit: Payer: Self-pay | Admitting: Family Medicine

## 2022-11-26 DIAGNOSIS — F419 Anxiety disorder, unspecified: Secondary | ICD-10-CM

## 2022-11-26 DIAGNOSIS — I10 Essential (primary) hypertension: Secondary | ICD-10-CM

## 2022-12-02 ENCOUNTER — Ambulatory Visit (INDEPENDENT_AMBULATORY_CARE_PROVIDER_SITE_OTHER): Payer: 59 | Admitting: Family Medicine

## 2022-12-02 ENCOUNTER — Encounter (INDEPENDENT_AMBULATORY_CARE_PROVIDER_SITE_OTHER): Payer: Self-pay | Admitting: Family Medicine

## 2022-12-02 VITALS — BP 132/80 | HR 58 | Temp 97.5°F | Ht 67.0 in | Wt 211.0 lb

## 2022-12-02 DIAGNOSIS — R632 Polyphagia: Secondary | ICD-10-CM | POA: Diagnosis not present

## 2022-12-02 DIAGNOSIS — R7303 Prediabetes: Secondary | ICD-10-CM | POA: Diagnosis not present

## 2022-12-02 DIAGNOSIS — E559 Vitamin D deficiency, unspecified: Secondary | ICD-10-CM | POA: Diagnosis not present

## 2022-12-02 DIAGNOSIS — E669 Obesity, unspecified: Secondary | ICD-10-CM | POA: Diagnosis not present

## 2022-12-02 DIAGNOSIS — Z6833 Body mass index (BMI) 33.0-33.9, adult: Secondary | ICD-10-CM

## 2022-12-02 MED ORDER — TOPIRAMATE ER 50 MG PO CAP24: 50.0000 mg | ORAL_CAPSULE | Freq: Every day | ORAL | 0 refills | Status: DC

## 2022-12-02 MED ORDER — VITAMIN D (ERGOCALCIFEROL) 1.25 MG (50000 UNIT) PO CAPS
50000.0000 [IU] | ORAL_CAPSULE | ORAL | 0 refills | Status: DC
Start: 2022-12-02 — End: 2022-12-23

## 2022-12-02 NOTE — Assessment & Plan Note (Signed)
Lab Results  Component Value Date   HGBA1C 6.3 (H) 06/16/2022   She has a hx of prediabetes, not on any medication. She has cut back on her intake of starches and sweets while recently increasing her walking time.  Repeat A1c today and consider use of metformin Continue a low sugar/ lower starch diet with weight reduction

## 2022-12-02 NOTE — Assessment & Plan Note (Signed)
Improving with eating on a schedule and protein intake with meals and snacks Improving on Trokendi XR 50 mg once daily without adverse side effects Her motivation to continue to make positive changes is good.  Refilled Trokendi XR 50 mg once daily. Log protein intake to ensure she is hitting a goal of ~110 g/ day

## 2022-12-02 NOTE — Progress Notes (Signed)
Office: [phone]  /  Fax: [phone]  WEIGHT SUMMARY AND BIOMETRICS  Starting Date: 03/18/22  Starting Weight: 229lb   Weight Lost Since Last Visit: 2lb   Vitals Temp: (!) 97.5 F (36.4 C) BP: 132/80 Pulse Rate: (!) 58 SpO2: 99 %   Body Composition  Body Fat %: 39.2 % Fat Mass (lbs): 83 lbs Muscle Mass (lbs): 122.2 lbs Total Body Water (lbs): 81.4 lbs Visceral Fat Rating : 9   HPI  Chief Complaint: OBESITY  Sue Jackson is here to discuss her progress with her obesity treatment plan. She is on the keeping a food journal and adhering to recommended goals of 1800 calories and 110 protein and states she is following her eating plan approximately 90 % of the time. She states she is exercising 30 minutes 2-3 times per week.   Interval History:  Since last office visit she is is down 2 lb Her net weight loss if 18 lb in the past 8 mos She increased her water intake She has maintained her muscle mass and lost 1.4 lb of body fat She is doing a better job with dietary logging and is hitting her protein goals more often now She started tracking her steps on her phone She is doing yard work but plans to use her punching bag Her spouse continues to eat a lot of sweets She is doing much better with meal prep and healthy food choices She increased her fiber intake and this has helped her hunger  Pharmacotherapy: Trokendi XR 50 mg   PHYSICAL EXAM:  Blood pressure 132/80, pulse (!) 58, temperature (!) 97.5 F (36.4 C), height  (1.702 m), weight 211 lb (95.7 kg), SpO2 99 %. Body mass index is 33.05 kg/m.  General: She is overweight, cooperative, alert, well developed, and in no acute distress. PSYCH: Has normal mood, affect and thought process.   Lungs: Normal breathing effort, no conversational dyspnea.   ASSESSMENT AND PLAN  TREATMENT PLAN FOR OBESITY:  Recommended Dietary Goals  [name] is currently in the action stage of change. As such, her goal is to  continue weight management plan. She has agreed to keeping a food journal and adhering to recommended goals of 1800 calories and 110 g of protein.  Behavioral Intervention  We discussed the following Behavioral Modification Strategies today: increasing lean protein intake, decreasing simple carbohydrates , increasing vegetables, increasing lower glycemic fruits, increasing water intake, work on managing stress, creating time for self-care and relaxation measures, avoiding temptations and identifying enticing environmental cues, continue to practice mindfulness when eating, planning for success, better snacking choices, and keeping healthy foods at home.  Additional resources provided today: NA  Recommended Physical Activity Goals  Takara has been advised to work up to 150 minutes of moderate intensity aerobic activity a week and strengthening exercises 2-3 times per week for cardiovascular health, weight loss maintenance and preservation of muscle mass.   She has agreed to [employer] strengthening exercises with a goal of 2-3 sessions a week   Pharmacotherapy changes for the treatment of obesity: no changes  ASSOCIATED CONDITIONS ADDRESSED TODAY  Pre-diabetes Assessment & Plan: Lab Results  Component Value Date   HGBA1C 6.3 (H) 06/16/2022   She has a hx of prediabetes, not on any medication. She has cut back on her intake of starches and sweets while recently increasing her walking time.  Repeat A1c today and consider use of metformin Continue a low sugar/ lower starch diet with weight reduction   Orders: -  Hemoglobin A1c  Polyphagia Assessment & Plan: Improving with eating on a schedule and protein intake with meals and snacks Improving on Trokendi XR 50 mg once daily without adverse side effects Her motivation to continue to make positive changes is good.  Refilled Trokendi XR 50 mg once daily. Log protein intake to ensure she is hitting a goal of ~110 g/ day  Orders: -      Topiramate ER; Take 1 capsule (50 mg total) by mouth daily with supper.  Dispense: 30 capsule; Refill: 0  Vitamin D deficiency -     Vitamin D (Ergocalciferol); Take 1 capsule (50,000 Units total) by mouth every 7 (seven) days.  Dispense: 5 capsule; Refill: 0 -     VITAMIN D 25 Hydroxy (Vit-D Deficiency, Fractures)  Generalized obesity: Starting BMI 35.8  BMI 33.0-33.9,adult      She was informed of the importance of frequent follow up visits to maximize her success with intensive lifestyle modifications for her multiple health conditions.   ATTESTASTION STATEMENTS:  Reviewed by clinician on day of visit: allergies, medications, problem list, medical history, surgical history, family history, social history, and previous encounter notes pertinent to obesity diagnosis.   I have personally spent 30 minutes total time today in preparation, patient care, nutritional counseling and documentation for this visit, including the following: review of clinical lab tests; review of medical tests/procedures/services.      Glennis Brink, DO DABFM, DABOM [facility] [address] [place], [place] [address] [phone]

## 2022-12-03 LAB — HEMOGLOBIN A1C
Est. average glucose Bld gHb Est-mCnc: 128 mg/dL
Hgb A1c MFr Bld: 6.1 % — ABNORMAL HIGH (ref 4.8–5.6)

## 2022-12-03 LAB — VITAMIN D 25 HYDROXY (VIT D DEFICIENCY, FRACTURES): Vit D, 25-Hydroxy: 25.1 ng/mL — ABNORMAL LOW (ref 30.0–100.0)

## 2022-12-08 ENCOUNTER — Ambulatory Visit: Payer: 59 | Admitting: Gastroenterology

## 2022-12-15 ENCOUNTER — Ambulatory Visit (HOSPITAL_COMMUNITY): Admitting: Clinical

## 2022-12-15 ENCOUNTER — Encounter (HOSPITAL_COMMUNITY): Payer: Self-pay

## 2022-12-17 ENCOUNTER — Telehealth: Payer: 59 | Admitting: Family Medicine

## 2022-12-17 DIAGNOSIS — R42 Dizziness and giddiness: Secondary | ICD-10-CM | POA: Diagnosis not present

## 2022-12-17 MED ORDER — MECLIZINE HCL 25 MG PO TABS: 25.0000 mg | ORAL_TABLET | Freq: Three times a day (TID) | ORAL | 0 refills | Status: DC | PRN

## 2022-12-17 NOTE — Patient Instructions (Signed)
Vertigo Vertigo is the feeling that you or your surroundings are moving when they are not. This feeling can come and go at any time. Vertigo often goes away on its own. Vertigo can be dangerous if it occurs while you are doing something that could endanger yourself or others, such as driving or operating machinery. Your health care provider will do tests to try to determine the cause of your vertigo. Tests will also help your health care provider decide how best to treat your condition. Follow these instructions at home: Eating and drinking     Dehydration can make vertigo worse. Drink enough fluid to keep your urine pale yellow. Do not drink alcohol. Activity Return to your normal activities as told by your health care provider. Ask your health care provider what activities are safe for you. In the morning, first sit up on the side of the bed. When you feel okay, stand slowly while you hold onto something until you know that your balance is fine. Move slowly. Avoid sudden body or head movements or certain positions, as told by your health care provider. If you have trouble walking or keeping your balance, try using a cane for stability. If you feel dizzy or unstable, sit down right away. Avoid doing any tasks that would cause danger to you or others if vertigo occurs. Avoid bending down if you feel dizzy. Place items in your home so that they are easy for you to reach without bending or leaning over. Do not drive or use machinery if you feel dizzy. General instructions Take over-the-counter and prescription medicines only as told by your health care provider. Keep all follow-up visits. This is important. Contact a health care provider if: Your medicines do not relieve your vertigo or they make it worse. Your condition gets worse or you develop new symptoms. You have a fever. You develop nausea or vomiting, or if nausea gets worse. Your family or friends notice any behavioral changes. You  have numbness or a prickling and tingling sensation in part of your body. Get help right away if you: Are always dizzy or you faint. Develop severe headaches. Develop a stiff neck. Develop sensitivity to light. Have difficulty moving or speaking. Have weakness in your hands, arms, or legs. Have changes in your hearing or vision. These symptoms may represent a serious problem that is an emergency. Do not wait to see if the symptoms will go away. Get medical help right away. Call your local emergency services (911 in the [place] Do not drive yourself to the hospital. Summary Vertigo is the feeling that you or your surroundings are moving when they are not. Your health care provider will do tests to try to determine the cause of your vertigo. Follow instructions for home care. You may be told to avoid certain tasks, positions, or movements. Contact a health care provider if your medicines do not relieve your symptoms, or if you have a fever, nausea, vomiting, or changes in behavior. Get help right away if you have severe headaches or difficulty speaking, or you develop hearing or vision problems. This information is not intended to replace advice given to you by your health care provider. Make sure you discuss any questions you have with your health care provider. Document Revised: 06/25/2020 Document Reviewed: 06/25/2020 Elsevier Patient Education  2023 Elsevier Inc.  

## 2022-12-17 NOTE — Progress Notes (Signed)
Virtual Visit Consent   Sue Jackson, you are scheduled for a virtual visit with a [facility] provider today. Just as with appointments in the office, your consent must be obtained to participate. Your consent will be active for this visit and any virtual visit you may have with one of our providers in the next 365 days. If you have a MyChart account, a copy of this consent can be sent to you electronically.  As this is a virtual visit, video technology does not allow for your provider to perform a traditional examination. This may limit your provider's ability to fully assess your condition. If your provider identifies any concerns that need to be evaluated in person or the need to arrange testing (such as labs, EKG, etc.), we will make arrangements to do so. Although advances in technology are sophisticated, we cannot ensure that it will always work on either your end or our end. If the connection with a video visit is poor, the visit may have to be switched to a telephone visit. With either a video or telephone visit, we are not always able to ensure that we have a secure connection.  By engaging in this virtual visit, you consent to the provision of healthcare and authorize for your insurance to be billed (if applicable) for the services provided during this visit. Depending on your insurance coverage, you may receive a charge related to this service.  I need to obtain your verbal consent now. Are you willing to proceed with your visit today? Jaloni Ostergren has provided verbal consent on 12/17/2022 for a virtual visit (video or telephone). Georgana Curio, FNP  Date: 12/17/2022 4:04 PM  Virtual Visit via Video Note   I, Georgana Curio, connected with  Jayleanna Pruitt  ([id number], 05/01/1981) on 12/17/22 at  4:00 PM EDT by a video-enabled telemedicine application and verified that I am speaking with the correct person using two identifiers.  Location: Patient: Virtual  Visit Location Patient: Home Provider: Virtual Visit Location Provider: Home Office   I discussed the limitations of evaluation and management by telemedicine and the availability of in person appointments. The patient expressed understanding and agreed to proceed.    History of Present Illness: Sue Jackson is a 42 y.o. who identifies as a female who was assigned female at birth, and is being seen today for spinning sensation with a history of vertigo. She requests a refill on meclizine and a work note. Denies other sx. Marland Kitchen  HPI: HPI  Problems:  Patient Active Problem List   Diagnosis Date Noted   Generalized obesity 09/22/2022   Headache, unspecified headache type 08/14/2022   Vestibular migraine 08/13/2022   RUQ abdominal pain 08/12/2022   Polyphagia 07/29/2022   Postoperative state 07/06/2022   Fibroids 05/19/2022   Uterine leiomyoma 04/20/2022   Pre-diabetes 04/20/2022   Vitamin D deficiency 04/01/2022   Insulin resistance 04/01/2022   Depression 04/01/2022   Malignant neoplasm of right female breast, unspecified estrogen receptor status, unspecified site of breast (HCC) 08/13/2021   Genetic testing 08/20/2020   Family history of breast cancer    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   Bipolar 2 disorder (HCC) 11/16/2019   Prediabetes 08/15/2018   Anxiety 02/06/2018   Low back pain radiating to right lower extremity 07/06/2017   Upper back pain, chronic 07/06/2017   Hypertension, essential, benign 03/11/2017    Allergies: No Known Allergies Medications:  Current Outpatient Medications:    albuterol (VENTOLIN  HFA) 108 (90 Base) MCG/ACT inhaler, Inhale 1-2 puffs into the lungs every 6 (six) hours as needed., Disp: 8 g, Rfl: 0   amLODipine-benazepril (LOTREL) 5-10 MG capsule, TAKE 1 CAPSULE BY MOUTH EVERY DAY, Disp: 90 capsule, Rfl: 2   cetirizine (ZYRTEC ALLERGY) 10 MG tablet, Take 1 tablet (10 mg total) by mouth daily., Disp: 90 tablet, Rfl:  1   cyclobenzaprine (FLEXERIL) 5 MG tablet, Take 1 tablet (5 mg total) by mouth 3 (three) times daily as needed for muscle spasms., Disp: 21 tablet, Rfl: 0   fluticasone (FLONASE) 50 MCG/ACT nasal spray, Place 2 sprays into both nostrils daily., Disp: 16 g, Rfl: 0   gabapentin (NEURONTIN) 100 MG capsule, Take 1 capsule (100 mg total) by mouth 3 (three) times daily., Disp: 90 capsule, Rfl: 3   gabapentin (NEURONTIN) 400 MG capsule, Take 1 capsule (400 mg total) by mouth 3 (three) times daily., Disp: 90 capsule, Rfl: 3   Galcanezumab-gnlm (EMGALITY) 120 MG/ML SOAJ, Inject 120 mg into the skin every 28 (twenty-eight) days., Disp: 1 mL, Rfl: 5   hydrOXYzine (ATARAX) 10 MG tablet, Take 1 tablet (10 mg total) by mouth 3 (three) times daily as needed., Disp: 90 tablet, Rfl: 3   ibuprofen (ADVIL) 600 MG tablet, Take 1 tablet (600 mg total) by mouth every 8 (eight) hours as needed., Disp: 30 tablet, Rfl: 0   ipratropium (ATROVENT) 0.03 % nasal spray, Place 2 sprays into both nostrils every 12 (twelve) hours., Disp: 30 mL, Rfl: 0   loperamide (IMODIUM A-D) 2 MG tablet, Take 1 tablet (2 mg total) by mouth 4 (four) times daily as needed for diarrhea or loose stools., Disp: 30 tablet, Rfl: 0   meclizine (ANTIVERT) 25 MG tablet, Take 1 tablet (25 mg total) by mouth 3 (three) times daily as needed for dizziness., Disp: 30 tablet, Rfl: 0   predniSONE (DELTASONE) 20 MG tablet, Take 2 tablets (40 mg total) by mouth daily with breakfast., Disp: 10 tablet, Rfl: 0   promethazine-dextromethorphan (PROMETHAZINE-DM) 6.25-15 MG/5ML syrup, Take 5 mLs by mouth 4 (four) times daily as needed., Disp: 118 mL, Rfl: 0   propranolol (INDERAL) 40 MG tablet, TAKE 1/2 TABLET BY MOUTH DAILY, Disp: 45 tablet, Rfl: 1   SUMAtriptan (IMITREX) 50 MG tablet, Take 1 tablet (50 mg total) by mouth as needed for migraine. May repeat in 2 hours if headache persists or recurs.  Maximum 2 tablets in 24 hours., Disp: 10 tablet, Rfl: 5   Topiramate ER  (TROKENDI XR) 25 MG CP24, 1 capsule po daily, Disp: 30 capsule, Rfl: 0   Topiramate ER (TROKENDI XR) 50 MG CP24, Take 1 capsule (50 mg total) by mouth daily with supper., Disp: 30 capsule, Rfl: 0   Vitamin D, Ergocalciferol, (DRISDOL) 1.25 MG (50000 UNIT) CAPS capsule, Take 1 capsule (50,000 Units total) by mouth every 7 (seven) days., Disp: 5 capsule, Rfl: 0  Observations/Objective: Patient is well-developed, well-nourished in no acute distress.  Resting comfortably  at home.  Head is normocephalic, atraumatic.  No labored breathing.  Speech is clear and coherent with logical content.  Patient is alert and oriented at baseline.    Assessment and Plan:  Vertigo UC if sx worsen, meclizine sent, work note provided.   Follow Up Instructions: I discussed the assessment and treatment plan with the patient. The patient was provided an opportunity to ask questions and all were answered. The patient agreed with the plan and demonstrated an understanding of the instructions.  A copy  of instructions were sent to the patient via MyChart unless otherwise noted below.   Patient has requested to receive PHI (AVS, Work Notes, etc) pertaining to this video visit through e-mail as they are currently without active MyChart. They have voiced understand that email is not considered secure and their health information could be viewed by someone other than the patient.   The patient was advised to call back or seek an in-person evaluation if the symptoms worsen or if the condition fails to improve as anticipated.  Time:  I spent 8 minutes with the patient via telehealth technology discussing the above problems/concerns.    Georgana Curio, FNP

## 2022-12-22 ENCOUNTER — Encounter (INDEPENDENT_AMBULATORY_CARE_PROVIDER_SITE_OTHER): Payer: Self-pay | Admitting: Family Medicine

## 2022-12-22 NOTE — Progress Notes (Unsigned)
TeleHealth Visit:  This visit was completed with telemedicine (audio/video) technology. Sue Jackson has verbally consented to this TeleHealth visit. The patient is located at home, the provider is located at home. The participants in this visit include the listed provider and patient. The visit was conducted today via MyChart video.  OBESITY Sue Jackson is here to discuss her progress with her obesity treatment plan along with follow-up of her obesity related diagnoses.   Today's visit was # 12 Starting weight: 229 lbs Starting date: 03/18/22 Weight at last in office visit: 211 lbs on 12/02/22 Total weight loss: 18 lbs at last in office visit on 12/02/22. Today's reported weight (12/23/22): none reported  Nutrition Plan: keeping a food journal with goal of 1800 calories and 110 grams of protein daily  Current exercise:   using punching bag some. Has very physical job at [employer] with heavy lifting and walking.  Interim History:  Sue Jackson is doing a very good job with the meal plan.  She is journaling 3 days per week but eats the same every day.   She is doing some meal prepping with ideas from social media. Her wife made her sugar free chocolate cake for her birthday 2 days ago.  She eats 3 meals per day. Water intake is very good Appetite well controlled as long as she eats enough protein.  Assessment/Plan:  We discussed recent lab results in depth.  1. Prediabetes Last A1c was 6.1, improved from 6.3 in November and 6.4 in August 2023.  Medication(s): None.  Unable to tolerate metformin. Lab Results  Component Value Date   HGBA1C 6.1 (H) 12/02/2022   HGBA1C 6.3 (H) 06/16/2022   HGBA1C 6.4 (H) 03/18/2022   HGBA1C 6.4 08/12/2021   HGBA1C 6.4 01/30/2020   No results found for: "INSULIN"  Plan: Continue to work on weight loss with good plan adherence. Encouraged her to be more consistent with exercise.   2. Polyphagia Hunger and cravings well controlled with Trokendi  50 mg daily.  Plan: Continue and refill Trokendi 50 mg daily   3. Vitamin D Deficiency Vitamin D is not at goal of 50.  Most recent vitamin D level decreased from 30 to 25 over the last 8 months.  She admits to taking her prescription vitamin D inconsistently. She is on  prescription ergocalciferol 50,000 IU weekly. Lab Results  Component Value Date   VD25OH 25.1 (L) 12/02/2022   VD25OH 30.8 03/18/2022    Plan: Continue and refill  prescription ergocalciferol 50,000 IU weekly Discussed ways to be more compliant with taking vitamin D.   4.  Generalized Obesity: Current BMI 33  Sue Jackson is currently in the action stage of change. As such, her goal is to continue with weight loss efforts.  She has agreed to keeping a food journal with goal of 1800 calories and 110 grams of protein daily.  1.  Encouraged her to become more consistent with an exercise routine.  Suggested that she exercise 30 minutes 3 times per week.  Behavioral modification strategies: increasing lean protein intake, decreasing simple carbohydrates , and increase frequency of journaling.  Sue Jackson has agreed to follow-up with our clinic in 4 weeks.   No orders of the defined types were placed in this encounter.   Medications Discontinued During This Encounter  Medication Reason   Topiramate ER (TROKENDI XR) 25 MG CP24    Topiramate ER (TROKENDI XR) 50 MG CP24 Reorder   Vitamin D, Ergocalciferol, (DRISDOL) 1.25 MG (50000 UNIT) CAPS capsule Reorder  Meds ordered this encounter  Medications   Topiramate ER (TROKENDI XR) 50 MG CP24    Sig: Take 1 capsule (50 mg total) by mouth daily with supper.    Dispense:  30 capsule    Refill:  0    Order Specific Question:   Supervising Provider    Answer:   Seymour Bars E [[id number]]   Vitamin D, Ergocalciferol, (DRISDOL) 1.25 MG (50000 UNIT) CAPS capsule    Sig: Take 1 capsule (50,000 Units total) by mouth every 7 (seven) days.    Dispense:  5 capsule    Refill:  0     Order Specific Question:   Supervising Provider    Answer:   Glennis Brink [[id number]]      Objective:   VITALS: Per patient if applicable, see vitals. GENERAL: Alert and in no acute distress. CARDIOPULMONARY: No increased WOB. Speaking in clear sentences.  PSYCH: Pleasant and cooperative. Speech normal rate and rhythm. Affect is appropriate. Insight and judgement are appropriate. Attention is focused, linear, and appropriate.  NEURO: Oriented as arrived to appointment on time with no prompting.   Attestation Statements:   Reviewed by clinician on day of visit: allergies, medications, problem list, medical history, surgical history, family history, social history, and previous encounter notes.   This was prepared with the assistance of Engineer, civil (consulting).  Occasional wrong-word or sound-a-like substitutions may have occurred due to the inherent limitations of voice recognition software.

## 2022-12-23 ENCOUNTER — Encounter (INDEPENDENT_AMBULATORY_CARE_PROVIDER_SITE_OTHER): Payer: Self-pay | Admitting: Family Medicine

## 2022-12-23 ENCOUNTER — Telehealth (INDEPENDENT_AMBULATORY_CARE_PROVIDER_SITE_OTHER): Payer: 59 | Admitting: Family Medicine

## 2022-12-23 DIAGNOSIS — E669 Obesity, unspecified: Secondary | ICD-10-CM | POA: Diagnosis not present

## 2022-12-23 DIAGNOSIS — R7303 Prediabetes: Secondary | ICD-10-CM

## 2022-12-23 DIAGNOSIS — E559 Vitamin D deficiency, unspecified: Secondary | ICD-10-CM

## 2022-12-23 DIAGNOSIS — Z6833 Body mass index (BMI) 33.0-33.9, adult: Secondary | ICD-10-CM

## 2022-12-23 DIAGNOSIS — R632 Polyphagia: Secondary | ICD-10-CM

## 2022-12-23 MED ORDER — TOPIRAMATE ER 50 MG PO CAP24: 50.0000 mg | ORAL_CAPSULE | Freq: Every day | ORAL | 0 refills | Status: DC

## 2022-12-23 MED ORDER — VITAMIN D (ERGOCALCIFEROL) 1.25 MG (50000 UNIT) PO CAPS
50000.0000 [IU] | ORAL_CAPSULE | ORAL | 0 refills | Status: DC
Start: 2022-12-23 — End: 2023-01-26

## 2022-12-29 ENCOUNTER — Ambulatory Visit (INDEPENDENT_AMBULATORY_CARE_PROVIDER_SITE_OTHER): Payer: 59 | Admitting: Clinical

## 2022-12-29 DIAGNOSIS — F3181 Bipolar II disorder: Secondary | ICD-10-CM

## 2022-12-29 NOTE — Progress Notes (Signed)
THERAPIST PROGRESS NOTE Virtual Visit via Video Note  I connected with Sue Jackson on 12/29/22 at 11:00 AM EDT by a video enabled telemedicine application and verified that I am speaking with the correct person using two identifiers.  Location: Patient: home Provider: office   I discussed the limitations of evaluation and management by telemedicine and the availability of in person appointments. The patient expressed understanding and agreed to proceed.   Follow Up Instructions: I discussed the assessment and treatment plan with the patient. The patient was provided an opportunity to ask questions and all were answered. The patient agreed with the plan and demonstrated an understanding of the instructions.   The patient was advised to call back or seek an in-person evaluation if the symptoms worsen or if the condition fails to improve as anticipated.    Session Time: 60 minutes  Participation Level: Active  Behavioral Response: CasualAlertIrritable  Type of Therapy: Individual Therapy  Treatment Goals addressed: client will practice problem solving skills 3 times per week for the next 4 weeks.  ProgressTowards Goals: Progressing  Interventions: CBT and Supportive  Summary:  Sue Jackson is a 42 y.o. female who presents for the scheduled appointment oriented times five, appropriately dressed and friendly. Client denied hallucinations and delusions. Client reported she has been feeling depressed. Client reported having issues with work and has been arguing with her wife. Client reported the work place is very toxic and misconduct occurs which management seems to tolerate. Client reported she would like to move up in positions but doe snot want to/can't because of how things are being run and how management goes about recruiting people for the job. Client reported upper management has been called in several times without resolve. Client reported the  workplace has caused her to be a different version of herself that she does not like, being more mean. Client reported otherwise she has felt a difference since going off of the propranolol. Client reported it was affecting her heart rate but she has not found a psych medication that has helped her mood as much as that one. Client reported she is happy that her health is slowly improving. Client reported her A1C has slowly gone down. Evidence of progress towards goal:  client reported 1 positive of being able to improve her negative mindset by reframing her negative thoughts.  Suicidal/Homicidal: Nowithout intent/plan  Therapist Response:  Therapist began the appointment asking the client how she has been doing since last seen. Therapist used CBT to engage using active listening and positive emotional support. Therapist used CBT to engage giving the client time to discuss her thoughts and about work, health and interpersonal relationships. Therapist used CBT to normalize the client emotions within reason. Therapist used CBT ask the client to identify her progress with frequency of use with coping skills with continued practice in her daily activity.    Therapist assigned the client homework to continue with healthy lifestyle practices and keeping a positive perspective.   Plan: Return again in 4 weeks.  Diagnosis: bipolar 2 disorder  Collaboration of Care: Patient refused AEB none requested by the client.  Patient/Guardian was advised Release of Information must be obtained prior to any record release in order to collaborate their care with an outside provider. Patient/Guardian was advised if they have not already done so to contact the registration department to sign all necessary forms in order for [place] to release information regarding their care.   Consent: Patient/Guardian gives verbal consent  for treatment and assignment of benefits for services provided during this visit. Patient/Guardian  expressed understanding and agreed to proceed.   Neena Rhymes Celisa Schoenberg, LCSW 12/29/2022

## 2022-12-30 ENCOUNTER — Ambulatory Visit (HOSPITAL_COMMUNITY): Payer: 59 | Admitting: Clinical

## 2022-12-31 ENCOUNTER — Encounter: Payer: Self-pay | Admitting: Nurse Practitioner

## 2022-12-31 ENCOUNTER — Telehealth: Payer: 59 | Admitting: Nurse Practitioner

## 2022-12-31 DIAGNOSIS — N926 Irregular menstruation, unspecified: Secondary | ICD-10-CM

## 2022-12-31 NOTE — Progress Notes (Signed)
Virtual Visit Consent   Mauriyah Suver, you are scheduled for a virtual visit with a [facility] provider today. Just as with appointments in the office, your consent must be obtained to participate. Your consent will be active for this visit and any virtual visit you may have with one of our providers in the next 365 days. If you have a MyChart account, a copy of this consent can be sent to you electronically.  As this is a virtual visit, video technology does not allow for your provider to perform a traditional examination. This may limit your provider's ability to fully assess your condition. If your provider identifies any concerns that need to be evaluated in person or the need to arrange testing (such as labs, EKG, etc.), we will make arrangements to do so. Although advances in technology are sophisticated, we cannot ensure that it will always work on either your end or our end. If the connection with a video visit is poor, the visit may have to be switched to a telephone visit. With either a video or telephone visit, we are not always able to ensure that we have a secure connection.  By engaging in this virtual visit, you consent to the provision of healthcare and authorize for your insurance to be billed (if applicable) for the services provided during this visit. Depending on your insurance coverage, you may receive a charge related to this service.  I need to obtain your verbal consent now. Are you willing to proceed with your visit today? Aleny Szafran has provided verbal consent on 12/31/2022 for a virtual visit (video or telephone). Viviano Simas, FNP  Date: 12/31/2022 12:00 PM  Virtual Visit via Video Note   I, Viviano Simas, connected with  Shaunee Perotti  ([id number], 1981/07/16) on 12/31/22 at 12:00 PM EDT by a video-enabled telemedicine application and verified that I am speaking with the correct person using two identifiers.  Location: Patient: Virtual  Visit Location Patient: Home Provider: Virtual Visit Location Provider: Home Office   I discussed the limitations of evaluation and management by telemedicine and the availability of in person appointments. The patient expressed understanding and agreed to proceed.    History of Present Illness: Rolanda Sue Jackson is a 42 y.o. who identifies as a female who was assigned female at birth, and is being seen today for onset of menses today.  She has a history of fibroids and uterine cysts, and heavy bleeding has become more of an issue in recent years.   She did have surgery last year   On the first day of her menstraul cycle she bleeds so heavily that is it hard for her to go to work   She is following up with her PCP and OBGYN on this issue   Problems:  Patient Active Problem List   Diagnosis Date Noted   Generalized obesity 09/22/2022   Headache, unspecified headache type 08/14/2022   Vestibular migraine 08/13/2022   RUQ abdominal pain 08/12/2022   Polyphagia 07/29/2022   Postoperative state 07/06/2022   Fibroids 05/19/2022   Uterine leiomyoma 04/20/2022   Pre-diabetes 04/20/2022   Vitamin D deficiency 04/01/2022   Insulin resistance 04/01/2022   Depression 04/01/2022   Malignant neoplasm of right female breast, unspecified estrogen receptor status, unspecified site of breast (HCC) 08/13/2021   Genetic testing 08/20/2020   Family history of breast cancer    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   Bipolar 2 disorder (HCC) 11/16/2019  Prediabetes 08/15/2018   Anxiety 02/06/2018   Low back pain radiating to right lower extremity 07/06/2017   Upper back pain, chronic 07/06/2017   Hypertension, essential, benign 03/11/2017    Allergies: No Known Allergies Medications:  Current Outpatient Medications:    albuterol (VENTOLIN HFA) 108 (90 Base) MCG/ACT inhaler, Inhale 1-2 puffs into the lungs every 6 (six) hours as needed., Disp: 8 g, Rfl: 0    amLODipine-benazepril (LOTREL) 5-10 MG capsule, TAKE 1 CAPSULE BY MOUTH EVERY DAY, Disp: 90 capsule, Rfl: 2   cetirizine (ZYRTEC ALLERGY) 10 MG tablet, Take 1 tablet (10 mg total) by mouth daily., Disp: 90 tablet, Rfl: 1   cyclobenzaprine (FLEXERIL) 5 MG tablet, Take 1 tablet (5 mg total) by mouth 3 (three) times daily as needed for muscle spasms., Disp: 21 tablet, Rfl: 0   fluticasone (FLONASE) 50 MCG/ACT nasal spray, Place 2 sprays into both nostrils daily., Disp: 16 g, Rfl: 0   gabapentin (NEURONTIN) 100 MG capsule, Take 1 capsule (100 mg total) by mouth 3 (three) times daily., Disp: 90 capsule, Rfl: 3   gabapentin (NEURONTIN) 400 MG capsule, Take 1 capsule (400 mg total) by mouth 3 (three) times daily., Disp: 90 capsule, Rfl: 3   Galcanezumab-gnlm (EMGALITY) 120 MG/ML SOAJ, Inject 120 mg into the skin every 28 (twenty-eight) days., Disp: 1 mL, Rfl: 5   hydrOXYzine (ATARAX) 10 MG tablet, Take 1 tablet (10 mg total) by mouth 3 (three) times daily as needed., Disp: 90 tablet, Rfl: 3   ibuprofen (ADVIL) 600 MG tablet, Take 1 tablet (600 mg total) by mouth every 8 (eight) hours as needed., Disp: 30 tablet, Rfl: 0   ipratropium (ATROVENT) 0.03 % nasal spray, Place 2 sprays into both nostrils every 12 (twelve) hours., Disp: 30 mL, Rfl: 0   loperamide (IMODIUM A-D) 2 MG tablet, Take 1 tablet (2 mg total) by mouth 4 (four) times daily as needed for diarrhea or loose stools., Disp: 30 tablet, Rfl: 0   meclizine (ANTIVERT) 25 MG tablet, Take 1 tablet (25 mg total) by mouth 3 (three) times daily as needed for dizziness., Disp: 30 tablet, Rfl: 0   predniSONE (DELTASONE) 20 MG tablet, Take 2 tablets (40 mg total) by mouth daily with breakfast., Disp: 10 tablet, Rfl: 0   promethazine-dextromethorphan (PROMETHAZINE-DM) 6.25-15 MG/5ML syrup, Take 5 mLs by mouth 4 (four) times daily as needed., Disp: 118 mL, Rfl: 0   propranolol (INDERAL) 40 MG tablet, TAKE 1/2 TABLET BY MOUTH DAILY, Disp: 45 tablet, Rfl: 1    SUMAtriptan (IMITREX) 50 MG tablet, Take 1 tablet (50 mg total) by mouth as needed for migraine. May repeat in 2 hours if headache persists or recurs.  Maximum 2 tablets in 24 hours., Disp: 10 tablet, Rfl: 5   Topiramate ER (TROKENDI XR) 50 MG CP24, Take 1 capsule (50 mg total) by mouth daily with supper., Disp: 30 capsule, Rfl: 0   Vitamin D, Ergocalciferol, (DRISDOL) 1.25 MG (50000 UNIT) CAPS capsule, Take 1 capsule (50,000 Units total) by mouth every 7 (seven) days., Disp: 5 capsule, Rfl: 0  Observations/Objective: Patient is well-developed, well-nourished in no acute distress.  Resting comfortably  at home.  Head is normocephalic, atraumatic.  No labored breathing.  Speech is clear and coherent with logical content.  Patient is alert and oriented at baseline.    Assessment and Plan: 1. Menstrual bleeding problem Will provide work note for today  Advised to follow up with OBGYN as planned   Seek UC visit if symptoms  worsen beyond typical cycle     Advised starting Rx Ibuprofen  Follow Up Instructions: I discussed the assessment and treatment plan with the patient. The patient was provided an opportunity to ask questions and all were answered. The patient agreed with the plan and demonstrated an understanding of the instructions.  A copy of instructions were sent to the patient via MyChart unless otherwise noted below.    The patient was advised to call back or seek an in-person evaluation if the symptoms worsen or if the condition fails to improve as anticipated.  Time:  I spent 7 minutes with the patient via telehealth technology discussing the above problems/concerns.    Viviano Simas, FNP

## 2023-01-14 ENCOUNTER — Other Ambulatory Visit (INDEPENDENT_AMBULATORY_CARE_PROVIDER_SITE_OTHER): Payer: Self-pay | Admitting: Family Medicine

## 2023-01-14 DIAGNOSIS — E559 Vitamin D deficiency, unspecified: Secondary | ICD-10-CM

## 2023-01-19 ENCOUNTER — Ambulatory Visit (INDEPENDENT_AMBULATORY_CARE_PROVIDER_SITE_OTHER): Payer: 59 | Admitting: Clinical

## 2023-01-19 ENCOUNTER — Encounter (HOSPITAL_COMMUNITY): Payer: Self-pay | Admitting: Psychiatry

## 2023-01-19 ENCOUNTER — Telehealth (INDEPENDENT_AMBULATORY_CARE_PROVIDER_SITE_OTHER): Payer: 59 | Admitting: Psychiatry

## 2023-01-19 DIAGNOSIS — F3181 Bipolar II disorder: Secondary | ICD-10-CM

## 2023-01-19 DIAGNOSIS — F419 Anxiety disorder, unspecified: Secondary | ICD-10-CM

## 2023-01-19 MED ORDER — GABAPENTIN 400 MG PO CAPS: 400.0000 mg | ORAL_CAPSULE | Freq: Three times a day (TID) | ORAL | 3 refills | Status: DC

## 2023-01-19 MED ORDER — HYDROXYZINE HCL 10 MG PO TABS: 10.0000 mg | ORAL_TABLET | Freq: Three times a day (TID) | ORAL | 3 refills | Status: DC | PRN

## 2023-01-19 MED ORDER — GABAPENTIN 100 MG PO CAPS: 100.0000 mg | ORAL_CAPSULE | Freq: Three times a day (TID) | ORAL | 3 refills | Status: DC

## 2023-01-19 NOTE — Progress Notes (Signed)
BH MD/PA/NP OP Progress Note Virtual Visit via Video Note  I connected with Sue Jackson on 01/19/23 at  4:00 PM EDT by a video enabled telemedicine application and verified that I am speaking with the correct person using two identifiers.  Location: Patient: Home Provider: Clinic   I discussed the limitations of evaluation and management by telemedicine and the availability of in person appointments. The patient expressed understanding and agreed to proceed.  I provided 30 minutes of non-face-to-face time during this encounter.      01/19/2023 4:04 PM Sue Jackson  MRN:  [id number]  Chief Complaint:  "I have been anxious at work"  HPI: 42 year old female seen today for follow-up psychiatric evaluation. She has a psychiatric history of bipolar disorder, SI, depression, and anxiety.  She is currently managed on Gabapentin 500 mg three times daily and hydroxyzine 10 mg 3 times daily as needed. She also is prescribed Topamax for weight management. She notes her medication is somewhat effective in managing her psychiatric conditions.   Today patient was well groomed, pleasant, cooperative, and engaged in conversation. 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 She notes that she wants a promotion but notes that her superior is inappropriate with her. Patient notes that she feels that her job promotes bad employees who are unsafe. Despite work stressors patient notes that her anxiety and depression has somewhat improved. Today provider conducted a GAD 7 and patient scored a 19, at her last visit she scored 20.  Provider also conducted PHQ-9 and  she scored a 11 , at her last visit she scored a 15.  She endorses adequate sleep and appetite.  She reports that she has lost over 10 pounds since her last visit. She continues to be on Topamax. Today she endorses passive SI but denies wanting to harm herself.  She denies SI/HI/VAH or paranoia.  At times she notes that  her mood fluctuate and reports that she is irritable. She notes that she has crying spells. She notes that her wife has been supportive. She denies other symptoms of mania  To cope with the above stressors patient notes that she listens to music and talks positively.   Patient notes that she suffer from vertigo. She notes that she has changed her diet and has been drinking more water to help manage it.   Patient reports at times she has bodily pain which she quantifies as a 3. She notes that Gabapentin and over-the-counter pain medications are effective in managing her pain.  Today provider offered a mood stabilize (Lamictal) or antidepressant (Prozac), however patient was not agreeable.  Provider discussed risk and benefits of being on hydroxyzine and Topamax.  She endorsed understanding and notes that she will take the medications at least 5 hours apart.  No medication changes made today.  Patient agreeable to medication as prescribed.  She will continue all other medications as prescribed.  No other concerns noted at this time.      Visit Diagnosis:  No diagnosis found.   Past Psychiatric History: : Bipolar depression, anxiety.  History of 1 prior psychiatry hospitaliation in 2013 after overdosing on Effexor. Past Medical History:  Past Medical History:  Diagnosis Date   Anemia 2019-2020   Anxiety    Arthritis    Bipolar disorder (HCC) 2013   Cancer [facility]) 2021   Depression    Family history of breast cancer    Headache 2021   Hypertension    Personal history of  radiation therapy    Pre-diabetes     Past Surgical History:  Procedure Laterality Date   BREAST BIOPSY Right 04/09/2020   BREAST BIOPSY Left 04/24/2020   x2   BREAST LUMPECTOMY Right 06/12/2020   BREAST LUMPECTOMY WITH RADIOACTIVE SEED LOCALIZATION Right 06/12/2020   Procedure: RIGHT BREAST LUMPECTOMY WITH RADIOACTIVE SEED LOCALIZATION;  Surgeon: Abigail Miyamoto, MD;  Location: [facility];  Service: General;   Laterality: Right;  LMA   ROBOTIC ASSISTED LAPAROSCOPIC LYSIS OF ADHESION  07/06/2022   Procedure: XI ROBOTIC ASSISTED LAPAROSCOPIC LYSIS OF ADHESION;  Surgeon: Genia Del, MD;  Location: [facility];  Service: Gynecology;;   ROBOTIC ASSISTED LAPAROSCOPIC OVARIAN CYSTECTOMY Right 07/06/2022   Procedure: XI ROBOTIC ASSISTED LAPAROSCOPIC right OVARIAN CYSTECTOMY with peritoneal washings;  Surgeon: Genia Del, MD;  Location: [facility] [place];  Service: Gynecology;  Laterality: Right;    Family Psychiatric History: Maternal aunt- depression  Family History:  Family History  Problem Relation Age of Onset   Arthritis Mother    Asthma Mother    Cirrhosis Father        d. 19   Depression Maternal Aunt    Hypertension Maternal Aunt    Breast cancer Maternal Aunt 71   Breast cancer Maternal Aunt 59   Breast cancer Paternal Aunt    Dementia Maternal Grandmother    Diabetes Maternal Grandfather    Heart disease Maternal Grandfather    Hypertension Maternal Grandfather    Stroke Maternal Grandfather    Breast cancer Cousin        pat first cousin    Social History:  Social History   Socioeconomic History   Marital status: Married    Spouse name: Not on file   Number of children: 0   Years of education: Not on file   Highest education level: Not on file  Occupational History   Not on file  Tobacco Use   Smoking status: Former    Types: E-cigarettes   Smokeless tobacco: Never   Tobacco comments:    VAPES  Vaping Use   Vaping Use: Every day   Substances: CBD  Substance and Sexual Activity   Alcohol use: Yes    Comment: occasionally   Drug use: Yes    Types: Marijuana   Sexual activity: Yes    Partners: Female  Other Topics Concern   Not on file  Social History Narrative   Right handed   Social Determinants of Health   Financial Resource Strain: Low Risk  (04/16/2020)   Overall Financial Resource Strain (CARDIA)    Difficulty of  Paying Living Expenses: Not very hard  Food Insecurity: No Food Insecurity (04/16/2020)   Hunger Vital Sign    Worried About Running Out of Food in the Last Year: Never true    Ran Out of Food in the Last Year: Never true  Transportation Needs: No Transportation Needs (04/16/2020)   PRAPARE - Administrator, Civil Service (Medical): No    Lack of Transportation (Non-Medical): No  Physical Activity: Not on file  Stress: Not on file  Social Connections: Not on file    Allergies: No Known Allergies  Metabolic Disorder Labs: Lab Results  Component Value Date   HGBA1C 6.1 (H) 12/02/2022   No results found for: "PROLACTIN" Lab Results  Component Value Date   CHOL 154 03/18/2022   TRIG 65 03/18/2022   HDL 52 03/18/2022   CHOLHDL 3.0 03/18/2022   VLDL 60.4 01/30/2020  LDLCALC 89 03/18/2022   LDLCALC 76 01/30/2020   Lab Results  Component Value Date   TSH 1.810 03/18/2022   TSH 2.51 11/21/2018    Therapeutic Level Labs: No results found for: "LITHIUM" No results found for: "VALPROATE" No results found for: "CBMZ"  Current Medications: Current Outpatient Medications  Medication Sig Dispense Refill   albuterol (VENTOLIN HFA) 108 (90 Base) MCG/ACT inhaler Inhale 1-2 puffs into the lungs every 6 (six) hours as needed. 8 g 0   amLODipine-benazepril (LOTREL) 5-10 MG capsule TAKE 1 CAPSULE BY MOUTH EVERY DAY 90 capsule 2   cetirizine (ZYRTEC ALLERGY) 10 MG tablet Take 1 tablet (10 mg total) by mouth daily. 90 tablet 1   cyclobenzaprine (FLEXERIL) 5 MG tablet Take 1 tablet (5 mg total) by mouth 3 (three) times daily as needed for muscle spasms. 21 tablet 0   fluticasone (FLONASE) 50 MCG/ACT nasal spray Place 2 sprays into both nostrils daily. 16 g 0   gabapentin (NEURONTIN) 100 MG capsule Take 1 capsule (100 mg total) by mouth 3 (three) times daily. 90 capsule 3   gabapentin (NEURONTIN) 400 MG capsule Take 1 capsule (400 mg total) by mouth 3 (three) times daily. 90 capsule  3   Galcanezumab-gnlm (EMGALITY) 120 MG/ML SOAJ Inject 120 mg into the skin every 28 (twenty-eight) days. 1 mL 5   hydrOXYzine (ATARAX) 10 MG tablet Take 1 tablet (10 mg total) by mouth 3 (three) times daily as needed. 90 tablet 3   ibuprofen (ADVIL) 600 MG tablet Take 1 tablet (600 mg total) by mouth every 8 (eight) hours as needed. 30 tablet 0   ipratropium (ATROVENT) 0.03 % nasal spray Place 2 sprays into both nostrils every 12 (twelve) hours. 30 mL 0   loperamide (IMODIUM A-D) 2 MG tablet Take 1 tablet (2 mg total) by mouth 4 (four) times daily as needed for diarrhea or loose stools. 30 tablet 0   meclizine (ANTIVERT) 25 MG tablet Take 1 tablet (25 mg total) by mouth 3 (three) times daily as needed for dizziness. 30 tablet 0   predniSONE (DELTASONE) 20 MG tablet Take 2 tablets (40 mg total) by mouth daily with breakfast. 10 tablet 0   promethazine-dextromethorphan (PROMETHAZINE-DM) 6.25-15 MG/5ML syrup Take 5 mLs by mouth 4 (four) times daily as needed. 118 mL 0   propranolol (INDERAL) 40 MG tablet TAKE 1/2 TABLET BY MOUTH DAILY 45 tablet 1   SUMAtriptan (IMITREX) 50 MG tablet Take 1 tablet (50 mg total) by mouth as needed for migraine. May repeat in 2 hours if headache persists or recurs.  Maximum 2 tablets in 24 hours. 10 tablet 5   Topiramate ER (TROKENDI XR) 50 MG CP24 Take 1 capsule (50 mg total) by mouth daily with supper. 30 capsule 0   Vitamin D, Ergocalciferol, (DRISDOL) 1.25 MG (50000 UNIT) CAPS capsule Take 1 capsule (50,000 Units total) by mouth every 7 (seven) days. 5 capsule 0   No current facility-administered medications for this visit.     Musculoskeletal: Strength & Muscle Tone: within normal limits and Telehealth visit Gait & Station: normal, Telehealth visit Patient leans: N/A  Psychiatric Specialty Exam: Review of Systems  There were no vitals taken for this visit.There is no height or weight on file to calculate BMI.  General Appearance: Well groomed  Eye Contact:   Good  Speech:  Clear and Coherent and Normal Rate  Volume:  Normal  Mood:  Anxious and Depressed, improving  Affect:  Appropriate and Congruent  Thought  Process:  Coherent, Goal Directed, and Linear  Orientation:  Full (Time, Place, and Person)  Thought Content: WDL and Logical   Suicidal Thoughts:  Yes.  without intent/plan  Homicidal Thoughts:  No  Memory:  Immediate;   Good Recent;   Good Remote;   Good  Judgement:  Good  Insight:  Good  Psychomotor Activity:  Normal  Concentration:  Concentration: Good and Attention Span: Good  Recall:  Good  Fund of Knowledge: Good  Language: Good  Akathisia:  No  Handed:  Right  AIMS (if indicated): not done  Assets:  Communication Skills Desire for Improvement Financial Resources/Insurance Housing Intimacy Physical Health Social Support  ADL's:  Intact  Cognition: WNL  Sleep:  Good   Screenings: GAD-7    Flowsheet Row Video Visit from 11/11/2022 in [facility] Video Visit from 08/18/2022 in [facility] Video Visit from 05/06/2022 in [facility] Video Visit from 02/02/2022 in [facility] Video Visit from 10/01/2021 in [facility]  Total GAD-7 Score 20 21 16 11 9       PHQ2-9    Flowsheet Row Video Visit from 11/11/2022 in [facility] Video Visit from 08/18/2022 in [facility] Video Visit from 05/06/2022 in [facility] Office Visit from 03/18/2022 in [facility] Visit from 03/17/2022 in [facility] [place]  PHQ-2 Total Score 5 6 5 6 5   PHQ-9 Total Score 15 19 13 13 17       Flowsheet Row Video Visit from 11/11/2022 in [facility] Video Visit from 08/18/2022 in [facility] Admission  (Discharged) from 07/06/2022 in [facility]  C-SSRS RISK CATEGORY Error: Q7 should not be populated when Q6 is No Error: Q7 should not be populated when Q6 is No No Risk (P)         Assessment and Plan: Patient endorses increased anxiety, depression, hypomania and SI. Today provider offered a mood stabilize (Lamictal) or antidepressant (Prozac), however patient was not agreeable.  Provider discussed risk and benefits of being on hydroxyzine and Topamax.  She endorsed understanding and notes that she will take the medications at least 5 hours apart.  No medication changes made today.  Patient agreeable to medication as prescribed.  She will continue all other medications as prescribed.  1. Anxiety  Continue- hydrOXYzine (ATARAX) 10 MG tablet; Take 1 tablet (10 mg total) by mouth 3 (three) times daily as needed.  Dispense: 90 tablet; Refill: 3 Continue- gabapentin (NEURONTIN) 100 MG capsule; Take 1 capsule (100 mg total) by mouth 3 (three) times daily.  Dispense: 90 capsule; Refill: 3 Continue- gabapentin (NEURONTIN) 400 MG capsule; Take 1 capsule (400 mg total) by mouth 3 (three) times daily.  Dispense: 90 capsule; Refill: 3  2. Bipolar 2 disorder  Continue- gabapentin (NEURONTIN) 100 MG capsule; Take 1 capsule (100 mg total) by mouth 3 (three) times daily.  Dispense: 90 capsule; Refill: 3 Continue- gabapentin (NEURONTIN) 400 MG capsule; Take 1 capsule (400 mg total) by mouth 3 (three) times daily.  Dispense: 90 capsule; Refill: 3    Follow-up in 3 months Follow-up with therapy  Shanna Cisco, NP 01/19/2023, 4:04 PM

## 2023-01-20 ENCOUNTER — Ambulatory Visit (INDEPENDENT_AMBULATORY_CARE_PROVIDER_SITE_OTHER): Payer: 59 | Admitting: Family Medicine

## 2023-01-26 ENCOUNTER — Encounter (INDEPENDENT_AMBULATORY_CARE_PROVIDER_SITE_OTHER): Payer: Self-pay | Admitting: Family Medicine

## 2023-01-26 ENCOUNTER — Ambulatory Visit (INDEPENDENT_AMBULATORY_CARE_PROVIDER_SITE_OTHER): Payer: 59 | Admitting: Family Medicine

## 2023-01-26 VITALS — BP 118/78 | HR 62 | Temp 98.2°F | Ht 67.0 in | Wt 209.0 lb

## 2023-01-26 DIAGNOSIS — Z6832 Body mass index (BMI) 32.0-32.9, adult: Secondary | ICD-10-CM

## 2023-01-26 DIAGNOSIS — R632 Polyphagia: Secondary | ICD-10-CM | POA: Diagnosis not present

## 2023-01-26 DIAGNOSIS — R7303 Prediabetes: Secondary | ICD-10-CM

## 2023-01-26 DIAGNOSIS — E559 Vitamin D deficiency, unspecified: Secondary | ICD-10-CM | POA: Diagnosis not present

## 2023-01-26 DIAGNOSIS — E669 Obesity, unspecified: Secondary | ICD-10-CM | POA: Diagnosis not present

## 2023-01-26 MED ORDER — VITAMIN D (ERGOCALCIFEROL) 1.25 MG (50000 UNIT) PO CAPS
50000.0000 [IU] | ORAL_CAPSULE | ORAL | 0 refills | Status: DC
Start: 2023-01-26 — End: 2023-02-17

## 2023-01-26 MED ORDER — TOPIRAMATE ER 50 MG PO CAP24: 50.0000 mg | ORAL_CAPSULE | Freq: Every day | ORAL | 0 refills | Status: DC

## 2023-01-26 NOTE — Assessment & Plan Note (Signed)
Last vitamin D Lab Results  Component Value Date   VD25OH 25.1 (L) 12/02/2022   Doing well on RX ergocalciferol weekly without adverse SE Energy level has improved Target 50-70.  Recheck level in 2 mos

## 2023-01-26 NOTE — Assessment & Plan Note (Signed)
Improving on Trokendi XR 50 mg once daily without adverse SE  Plan to recheck chemistry panel with next set of labs

## 2023-01-26 NOTE — Progress Notes (Signed)
Office: [phone]  /  Fax: [phone]  WEIGHT SUMMARY AND BIOMETRICS  Starting Date: 03/18/22  Starting Weight: 229lb   Weight Lost Since Last Visit: 2lb   Vitals Temp: 98.2 F (36.8 C) BP: 118/78 Pulse Rate: 62 SpO2: 100 %   Body Composition  Body Fat %: 39.1 % Fat Mass (lbs): 81.8 lbs Muscle Mass (lbs): 121.2 lbs Total Body Water (lbs): 82.6 lbs Visceral Fat Rating : 9     HPI  Chief Complaint: OBESITY  Sue Jackson is here to discuss her progress with her obesity treatment plan. She is on the keeping a food journal and adhering to recommended goals of 1800 calories and 90-100 protein and states she is following her eating plan approximately 98 % of the time. She states she is exercising 30-60 minutes 2 times per week.   Interval History:  Since last office visit she is down 2 lb She has been pet sitting and using a punching bag She is in counselor for bipolar 2, dealing with a lot anxiety She last met with Dawn via video visit She is on Trokendi XR 50 mg which is helping with food impulse control She is logging most days of the week She has a net weight loss 20 lb in the past 10 mos of medically supervised weight management She is feeling stronger and has better endurance  Pharmacotherapy: Trokendi XR 50 mg daily  PHYSICAL EXAM:  Blood pressure 118/78, pulse 62, temperature 98.2 F (36.8 C), height 5\' 7"  (1.702 m), weight 209 lb (94.8 kg), SpO2 100 %. Body mass index is 32.73 kg/m.  General: She is overweight, cooperative, alert, well developed, and in no acute distress. PSYCH: Has normal mood, affect and thought process.   Lungs: Normal breathing effort, no conversational dyspnea.   ASSESSMENT AND PLAN  TREATMENT PLAN FOR OBESITY:  Recommended Dietary Goals  Sue Jackson is currently in the action stage of change. As such, her goal is to continue weight management plan. She has agreed to keeping a food journal and adhering to recommended goals of  1800 calories and 100 g of protein.  Behavioral Intervention  We discussed the following Behavioral Modification Strategies today: increasing lean protein intake, decreasing simple carbohydrates , increasing vegetables, increasing lower glycemic fruits, increasing water intake, work on meal planning and preparation, keeping healthy foods at home, work on managing stress, creating time for self-care and relaxation measures, continue to practice mindfulness when eating, and planning for success.  Additional resources provided today: NA  Recommended Physical Activity Goals  Sue Jackson has been advised to work up to 150 minutes of moderate intensity aerobic activity a week and strengthening exercises 2-3 times per week for cardiovascular health, weight loss maintenance and preservation of muscle mass.   She has agreed to Work on scheduling and tracking physical activity.   Pharmacotherapy changes for the treatment of obesity: none  ASSOCIATED CONDITIONS ADDRESSED TODAY  Polyphagia Assessment & Plan: Improving on Trokendi XR 50 mg once daily without adverse SE  Plan to recheck chemistry panel with next set of labs  Orders: -     Topiramate ER; Take 1 capsule (50 mg total) by mouth daily with supper.  Dispense: 30 capsule; Refill: 0  Vitamin D deficiency Assessment & Plan: Last vitamin D Lab Results  Component Value Date   VD25OH 25.1 (L) 12/02/2022   Doing well on RX ergocalciferol weekly without adverse SE Energy level has improved Target 50-70.  Recheck level in 2 mos  Orders: -  Vitamin D (Ergocalciferol); Take 1 capsule (50,000 Units total) by mouth every 7 (seven) days.  Dispense: 5 capsule; Refill: 0  Generalized obesity with starting BMI 35 Assessment & Plan: Reviewed patient's overall progress. She has maintained most of her lean muscle mass and has really been working on improving her relationship with food, feeling much more in control of her eating habits and  overall feels better.  She has ramped up exercise and is physically feeling stronger. Her spouse is also starting to work on her weight.  She hs a net weight loss of 20 lb in 10 mos of medically supervised weight management. This is an 8.7% TBW loss without use of anti obesity medications.  Continue to track caloric intake and focus on lean protein and fiber with meals Continue to challenge self with physical activity goals   BMI 32.0-32.9,adult  Prediabetes Assessment & Plan: Lab Results  Component Value Date   HGBA1C 6.1 (H) 12/02/2022   A1c is improving, down from 6.3 Working on reducing her intake of starches and sweets and increasing levels of physical activity Previously had mood changes from metformin Has associated IR with a fasting insulin of 16.  Recheck A1c and fasting insulin in October.       She was informed of the importance of frequent follow up visits to maximize her success with intensive lifestyle modifications for her multiple health conditions.   ATTESTASTION STATEMENTS:  Reviewed by clinician on day of visit: allergies, medications, problem list, medical history, surgical history, family history, social history, and previous encounter notes pertinent to obesity diagnosis.   I have personally spent 30 minutes total time today in preparation, patient care, nutritional counseling and documentation for this visit, including the following: review of clinical lab tests; review of medical tests/procedures/services.      Sue Brink, DO DABFM, DABOM [facility] [address] [place], [place] [address] [phone]

## 2023-01-26 NOTE — Assessment & Plan Note (Signed)
Reviewed patient's overall progress. She has maintained most of her lean muscle mass and has really been working on improving her relationship with food, feeling much more in control of her eating habits and overall feels better.  She has ramped up exercise and is physically feeling stronger. Her spouse is also starting to work on her weight.  She hs a net weight loss of 20 lb in 10 mos of medically supervised weight management. This is an 8.7% TBW loss without use of anti obesity medications.  Continue to track caloric intake and focus on lean protein and fiber with meals Continue to challenge self with physical activity goals

## 2023-01-26 NOTE — Assessment & Plan Note (Signed)
Lab Results  Component Value Date   HGBA1C 6.1 (H) 12/02/2022   A1c is improving, down from 6.3 Working on reducing her intake of starches and sweets and increasing levels of physical activity Previously had mood changes from metformin Has associated IR with a fasting insulin of 16.  Recheck A1c and fasting insulin in October.

## 2023-01-28 ENCOUNTER — Encounter (INDEPENDENT_AMBULATORY_CARE_PROVIDER_SITE_OTHER): Payer: Self-pay

## 2023-02-11 ENCOUNTER — Encounter: Payer: Self-pay | Admitting: Neurology

## 2023-02-14 NOTE — Progress Notes (Signed)
   THERAPIST PROGRESS NOTE Virtual Visit via Video Note  I connected with Sue Jackson on 01/19/2023 at  3:00 PM EDT by a video enabled telemedicine application and verified that I am speaking with the correct person using two identifiers.  Location: Patient: home Provider: office   I discussed the limitations of evaluation and management by telemedicine and the availability of in person appointments. The patient expressed understanding and agreed to proceed.   Follow Up Instructions: I discussed the assessment and treatment plan with the patient. The patient was provided an opportunity to ask questions and all were answered. The patient agreed with the plan and demonstrated an understanding of the instructions.   The patient was advised to call back or seek an in-person evaluation if the symptoms worsen or if the condition fails to improve as anticipated.    Session Time: 40 minutes  Participation Level: Active  Behavioral Response: CasualAlertIrritable  Type of Therapy: Individual Therapy  Treatment Goals addressed: client will complete 80% of assigned homework  ProgressTowards Goals: Progressing  Interventions: CBT and Supportive  Summary:  Sue Jackson is a 42 y.o. female who presents for the scheduled appointment oriented times five, appropriately dressed and friendly. Client denied hallucinations and delusions. Client reported on today she has been having a mix of emotions. Client reported she has been very anxious. Client reported she has learned she cannot take her weight loss medication and psychiatric medications to close together in time because it makes her dizzy and light headed. 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 Client reported they have enjoyed doing it and spending time together. Client reported work is still the same and stressful.  Evidence of progress towards  goal:  client reported medication compliance 7 days per week.   Suicidal/Homicidal: Nowithout intent/plan  Therapist Response:  Therapist began the appointment asking the client how she has been doing. Therapist used CBT to engage using active listening and positive emotional support. Therapist used CBT to engage and give the client time to discuss her thoughts and feelings. Therapist used CBT to reinforce positive behavioral activation. Therapist used CBT ask the client to identify her progress with frequency of use with coping skills with continued practice in her daily activity.    Therapist assigned homework to practice self care.    Plan: Return again in 4 weeks.  Diagnosis: bipolar 2 disorder  Collaboration of Care: Patient refused AEB none requested by the client.  Patient/Guardian was advised Release of Information must be obtained prior to any record release in order to collaborate their care with an outside provider. Patient/Guardian was advised if they have not already done so to contact the registration department to sign all necessary forms in order for [place] to release information regarding their care.   Consent: Patient/Guardian gives verbal consent for treatment and assignment of benefits for services provided during this visit. Patient/Guardian expressed understanding and agreed to proceed.   Neena Rhymes Shavana Calder, LCSW 01/19/2023

## 2023-02-15 ENCOUNTER — Other Ambulatory Visit (INDEPENDENT_AMBULATORY_CARE_PROVIDER_SITE_OTHER): Payer: Self-pay | Admitting: Family Medicine

## 2023-02-15 DIAGNOSIS — E559 Vitamin D deficiency, unspecified: Secondary | ICD-10-CM

## 2023-02-16 ENCOUNTER — Encounter: Payer: Self-pay | Admitting: Nurse Practitioner

## 2023-02-16 ENCOUNTER — Telehealth: Payer: 59 | Admitting: Nurse Practitioner

## 2023-02-16 ENCOUNTER — Ambulatory Visit: Payer: 59 | Admitting: Gastroenterology

## 2023-02-16 DIAGNOSIS — G43809 Other migraine, not intractable, without status migrainosus: Secondary | ICD-10-CM

## 2023-02-16 NOTE — Progress Notes (Signed)
Duplicate visits scheduled

## 2023-02-16 NOTE — Progress Notes (Signed)
Virtual Visit Consent   Sue Jackson, you are scheduled for a virtual visit with a [facility] provider today. Just as with appointments in the office, your consent must be obtained to participate. Your consent will be active for this visit and any virtual visit you may have with one of our providers in the next 365 days. If you have a MyChart account, a copy of this consent can be sent to you electronically.  As this is a virtual visit, video technology does not allow for your provider to perform a traditional examination. This may limit your provider's ability to fully assess your condition. If your provider identifies any concerns that need to be evaluated in person or the need to arrange testing (such as labs, EKG, etc.), we will make arrangements to do so. Although advances in technology are sophisticated, we cannot ensure that it will always work on either your end or our end. If the connection with a video visit is poor, the visit may have to be switched to a telephone visit. With either a video or telephone visit, we are not always able to ensure that we have a secure connection.  By engaging in this virtual visit, you consent to the provision of healthcare and authorize for your insurance to be billed (if applicable) for the services provided during this visit. Depending on your insurance coverage, you may receive a charge related to this service.  I need to obtain your verbal consent now. Are you willing to proceed with your visit today? Sue Jackson has provided verbal consent on 02/16/2023 for a virtual visit (video or telephone). Viviano Simas, FNP  Date: 02/16/2023 12:25 PM  Virtual Visit via Video Note   I, Viviano Simas, connected with  Sue Jackson  ([id number], 25-Jul-1981) on 02/16/23 at 12:30 PM EDT by a video-enabled telemedicine application and verified that I am speaking with the correct person using two identifiers.  Location: Patient: Virtual  Visit Location Patient: Home Provider: Virtual Visit Location Provider: Home Office   I discussed the limitations of evaluation and management by telemedicine and the availability of in person appointments. The patient expressed understanding and agreed to proceed.    History of Present Illness: Sue Jackson is a 42 y.o. who identifies as a female who was assigned female at birth, and is being seen today for migraines   She has a history of vestibular migraines and is a patient of Dr. Everlena Cooper at [facility]rology  She had an episode yesterday  She experienced a migraine with dizziness and nausea yesterday and took meclizine for relief   With that and rest she has started to improve this morning, but has not resolved she is still having some residual vertigo  She is able to keep liquids down   She did contact her neurologist and has a follow up in August   She feels the recent heat has intensified her migraines and she has been having a hard time getting to work. She is working on [employer] paperwork with her Neurologist   Problems:  Patient Active Problem List   Diagnosis Date Noted   Generalized obesity with starting BMI 35 09/22/2022   Headache, unspecified headache type 08/14/2022   Vestibular migraine 08/13/2022   RUQ abdominal pain 08/12/2022   Polyphagia 07/29/2022   Postoperative state 07/06/2022   Fibroids 05/19/2022   Uterine leiomyoma 04/20/2022   Pre-diabetes 04/20/2022   Vitamin D deficiency 04/01/2022   Insulin resistance 04/01/2022   Depression 04/01/2022  Malignant neoplasm of right female breast, unspecified estrogen receptor status, unspecified site of breast (HCC) 08/13/2021   Genetic testing 08/20/2020   Family history of breast cancer    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   Bipolar 2 disorder (HCC) 11/16/2019   Prediabetes 08/15/2018   Anxiety 02/06/2018   Low back pain radiating to right lower extremity 07/06/2017   Upper back pain,  chronic 07/06/2017   Hypertension, essential, benign 03/11/2017    Allergies: No Known Allergies Medications:  Current Outpatient Medications:    albuterol (VENTOLIN HFA) 108 (90 Base) MCG/ACT inhaler, Inhale 1-2 puffs into the lungs every 6 (six) hours as needed., Disp: 8 g, Rfl: 0   amLODipine-benazepril (LOTREL) 5-10 MG capsule, TAKE 1 CAPSULE BY MOUTH EVERY DAY, Disp: 90 capsule, Rfl: 2   cetirizine (ZYRTEC ALLERGY) 10 MG tablet, Take 1 tablet (10 mg total) by mouth daily., Disp: 90 tablet, Rfl: 1   cyclobenzaprine (FLEXERIL) 5 MG tablet, Take 1 tablet (5 mg total) by mouth 3 (three) times daily as needed for muscle spasms., Disp: 21 tablet, Rfl: 0   fluticasone (FLONASE) 50 MCG/ACT nasal spray, Place 2 sprays into both nostrils daily., Disp: 16 g, Rfl: 0   gabapentin (NEURONTIN) 100 MG capsule, Take 1 capsule (100 mg total) by mouth 3 (three) times daily., Disp: 90 capsule, Rfl: 3   gabapentin (NEURONTIN) 400 MG capsule, Take 1 capsule (400 mg total) by mouth 3 (three) times daily., Disp: 90 capsule, Rfl: 3   Galcanezumab-gnlm (EMGALITY) 120 MG/ML SOAJ, Inject 120 mg into the skin every 28 (twenty-eight) days., Disp: 1 mL, Rfl: 5   hydrOXYzine (ATARAX) 10 MG tablet, Take 1 tablet (10 mg total) by mouth 3 (three) times daily as needed., Disp: 90 tablet, Rfl: 3   ibuprofen (ADVIL) 600 MG tablet, Take 1 tablet (600 mg total) by mouth every 8 (eight) hours as needed., Disp: 30 tablet, Rfl: 0   ipratropium (ATROVENT) 0.03 % nasal spray, Place 2 sprays into both nostrils every 12 (twelve) hours., Disp: 30 mL, Rfl: 0   loperamide (IMODIUM A-D) 2 MG tablet, Take 1 tablet (2 mg total) by mouth 4 (four) times daily as needed for diarrhea or loose stools., Disp: 30 tablet, Rfl: 0   meclizine (ANTIVERT) 25 MG tablet, Take 1 tablet (25 mg total) by mouth 3 (three) times daily as needed for dizziness., Disp: 30 tablet, Rfl: 0   predniSONE (DELTASONE) 20 MG tablet, Take 2 tablets (40 mg total) by mouth  daily with breakfast., Disp: 10 tablet, Rfl: 0   promethazine-dextromethorphan (PROMETHAZINE-DM) 6.25-15 MG/5ML syrup, Take 5 mLs by mouth 4 (four) times daily as needed., Disp: 118 mL, Rfl: 0   propranolol (INDERAL) 40 MG tablet, TAKE 1/2 TABLET BY MOUTH DAILY, Disp: 45 tablet, Rfl: 1   SUMAtriptan (IMITREX) 50 MG tablet, Take 1 tablet (50 mg total) by mouth as needed for migraine. May repeat in 2 hours if headache persists or recurs.  Maximum 2 tablets in 24 hours., Disp: 10 tablet, Rfl: 5   Topiramate ER (TROKENDI XR) 50 MG CP24, Take 1 capsule (50 mg total) by mouth daily with supper., Disp: 30 capsule, Rfl: 0   Vitamin D, Ergocalciferol, (DRISDOL) 1.25 MG (50000 UNIT) CAPS capsule, Take 1 capsule (50,000 Units total) by mouth every 7 (seven) days., Disp: 5 capsule, Rfl: 0  Observations/Objective: Patient is well-developed, well-nourished in no acute distress.  Resting comfortably  at home.  Head is normocephalic, atraumatic.  No labored breathing.  Speech is clear and coherent with logical content.  Patient is alert and oriented at baseline.    Assessment and Plan: 1. Vestibular migraine  Advised to follow up with Neurologist this week, and UC in person if symptoms return or intensify   If unable to keep liquids down or with signs of dehydration as discussed     Continue management with medication regimen per neurology  Work note for today provided   Follow Up Instructions: I discussed the assessment and treatment plan with the patient. The patient was provided an opportunity to ask questions and all were answered. The patient agreed with the plan and demonstrated an understanding of the instructions.  A copy of instructions were sent to the patient via MyChart unless otherwise noted below.    The patient was advised to call back or seek an in-person evaluation if the symptoms worsen or if the condition fails to improve as anticipated.  Time:  I spent 15 minutes with the patient  via telehealth technology discussing the above problems/concerns.    Viviano Simas, FNP

## 2023-02-16 NOTE — Progress Notes (Unsigned)
TeleHealth Visit:  This visit was completed with telemedicine (audio/video) technology. Kimetha has verbally consented to this TeleHealth visit. The patient is located at home, the provider is located at home. The participants in this visit include the listed provider and patient. The visit was conducted today via MyChart video.  OBESITY Sue Jackson is here to discuss her progress with her obesity treatment plan along with follow-up of her obesity related diagnoses.   Today's visit was # 14 Starting weight: 229 lbs Starting date: 03/18/22 Weight at last in office visit: 209 lbs on 01/26/23 Total weight loss: 20 lbs at last in office visit on 01/26/23. Today's reported weight (02/17/23): none reported  Nutrition Plan: keeping a food journal with goal of 1800 calories and 100 grams of protein daily  Current exercise: punching bag, pushups 15 minutes 2 times per week   Interim History:  Has struggled with vestibular migraines recently caused by the excessive heat. She hasn't journaled the past few days due to having a migraine. Other than that she has journaled well and met her protein and calories goals. She really likes eggs, [place] burgers, beans, vegetables.  She cooks at home and meal preps. She does not feel well if she doesn't get in enough protein. She drinks 2 gallons of water daily-has a [employer]  Eating all of the prescribed protein: yes  Skipping meals: No Drinking adequate water: Yes Drinking sugar sweetened beverages: No Hunger controlled: well controlled. Cravings controlled:  well controlled.  Journaling Consistently:  Yes Meeting protein goals:  Yes Meeting calorie goals:  Yes   Assessment/Plan:  1. Polyphagia Currently this is well controlled.  She feels this works well for her. Medication(s): Trokendi 50 mg daily.  Reported side effects: None  Plan: Continue and refill Trokendi 50 mg daily   2. Vitamin D Deficiency Vitamin D is at goal of 50.  Most  recent vitamin D level was 25.1. She is on  prescription ergocalciferol 50,000 IU weekly. Lab Results  Component Value Date   VD25OH 25.1 (L) 12/02/2022   VD25OH 30.8 03/18/2022    Plan: Continue and refill  prescription ergocalciferol 50,000 IU weekly   3. Generalized Obesity: Current BMI 32 Vennela is currently in the action stage of change. As such, her goal is to continue with weight loss efforts.  She has agreed to keeping a food journal with goal of 1800 calories and 100 grams of protein daily.  Exercise goals: increase exercise to 30 minutes 4-5 times per week.  Behavioral modification strategies: keep a strict food journal.  Zylie has agreed to follow-up with our clinic in 5 weeks.  No orders of the defined types were placed in this encounter.   Medications Discontinued During This Encounter  Medication Reason   Topiramate ER (TROKENDI XR) 50 MG CP24 Reorder   Vitamin D, Ergocalciferol, (DRISDOL) 1.25 MG (50000 UNIT) CAPS capsule Reorder     Meds ordered this encounter  Medications   Topiramate ER (TROKENDI XR) 50 MG CP24    Sig: Take 1 capsule (50 mg total) by mouth daily with supper.    Dispense:  30 capsule    Refill:  0    Order Specific Question:   Supervising Provider    Answer:   Seymour Bars E [[id number]]   Vitamin D, Ergocalciferol, (DRISDOL) 1.25 MG (50000 UNIT) CAPS capsule    Sig: Take 1 capsule (50,000 Units total) by mouth every 7 (seven) days.    Dispense:  5 capsule    Refill:  0    Order Specific Question:   Supervising Provider    Answer:   Glennis Brink [[id number]]      Objective:   VITALS: Per patient if applicable, see vitals. GENERAL: Alert and in no acute distress. CARDIOPULMONARY: No increased WOB. Speaking in clear sentences.  PSYCH: Pleasant and cooperative. Speech normal rate and rhythm. Affect is appropriate. Insight and judgement are appropriate. Attention is focused, linear, and appropriate.  NEURO: Oriented as arrived to appointment  on time with no prompting.   Attestation Statements:   Reviewed by clinician on day of visit: allergies, medications, problem list, medical history, surgical history, family history, social history, and previous encounter notes.   This was prepared with the assistance of Engineer, civil (consulting).  Occasional wrong-word or sound-a-like substitutions may have occurred due to the inherent limitations of voice recognition software.

## 2023-02-17 ENCOUNTER — Encounter (INDEPENDENT_AMBULATORY_CARE_PROVIDER_SITE_OTHER): Payer: Self-pay | Admitting: Family Medicine

## 2023-02-17 ENCOUNTER — Telehealth (INDEPENDENT_AMBULATORY_CARE_PROVIDER_SITE_OTHER): Payer: 59 | Admitting: Family Medicine

## 2023-02-17 DIAGNOSIS — E669 Obesity, unspecified: Secondary | ICD-10-CM

## 2023-02-17 DIAGNOSIS — Z6832 Body mass index (BMI) 32.0-32.9, adult: Secondary | ICD-10-CM | POA: Diagnosis not present

## 2023-02-17 DIAGNOSIS — R632 Polyphagia: Secondary | ICD-10-CM | POA: Diagnosis not present

## 2023-02-17 DIAGNOSIS — E559 Vitamin D deficiency, unspecified: Secondary | ICD-10-CM

## 2023-02-17 MED ORDER — VITAMIN D (ERGOCALCIFEROL) 1.25 MG (50000 UNIT) PO CAPS
50000.0000 [IU] | ORAL_CAPSULE | ORAL | 0 refills | Status: DC
Start: 2023-02-17 — End: 2023-04-20

## 2023-02-17 MED ORDER — TOPIRAMATE ER 50 MG PO CAP24: 50.0000 mg | ORAL_CAPSULE | Freq: Every day | ORAL | 0 refills | Status: DC

## 2023-02-25 ENCOUNTER — Encounter: Payer: Self-pay | Admitting: Neurology

## 2023-02-25 ENCOUNTER — Encounter: Payer: Self-pay | Admitting: Family Medicine

## 2023-03-02 NOTE — Telephone Encounter (Signed)
Patient received letter from neuro.

## 2023-03-09 ENCOUNTER — Ambulatory Visit (HOSPITAL_COMMUNITY): Payer: 59 | Admitting: Clinical

## 2023-03-09 ENCOUNTER — Telehealth (HOSPITAL_COMMUNITY): Payer: Self-pay | Admitting: Clinical

## 2023-03-09 ENCOUNTER — Encounter (HOSPITAL_COMMUNITY): Payer: Self-pay

## 2023-03-09 NOTE — Telephone Encounter (Signed)
Client met at the scheduled time. Client reported she needs to reschedule the therapy appt. Client reported no crisis needs.

## 2023-03-12 ENCOUNTER — Other Ambulatory Visit: Payer: Self-pay | Admitting: Family Medicine

## 2023-03-12 DIAGNOSIS — I10 Essential (primary) hypertension: Secondary | ICD-10-CM

## 2023-03-16 ENCOUNTER — Ambulatory Visit (HOSPITAL_COMMUNITY): Payer: 59 | Admitting: Clinical

## 2023-03-21 ENCOUNTER — Other Ambulatory Visit (INDEPENDENT_AMBULATORY_CARE_PROVIDER_SITE_OTHER): Payer: Self-pay | Admitting: Family Medicine

## 2023-03-21 DIAGNOSIS — Z0279 Encounter for issue of other medical certificate: Secondary | ICD-10-CM

## 2023-03-21 DIAGNOSIS — R632 Polyphagia: Secondary | ICD-10-CM

## 2023-03-22 ENCOUNTER — Telehealth: Payer: Self-pay

## 2023-03-22 LAB — HM PAP SMEAR: HM Pap smear: NORMAL

## 2023-03-22 NOTE — Telephone Encounter (Signed)
FMLA Paperwork received.

## 2023-03-23 ENCOUNTER — Encounter: Payer: Self-pay | Admitting: Family Medicine

## 2023-03-23 ENCOUNTER — Encounter (INDEPENDENT_AMBULATORY_CARE_PROVIDER_SITE_OTHER): Payer: Self-pay | Admitting: Family Medicine

## 2023-03-23 ENCOUNTER — Ambulatory Visit (INDEPENDENT_AMBULATORY_CARE_PROVIDER_SITE_OTHER): Payer: 59 | Admitting: Family Medicine

## 2023-03-23 VITALS — BP 109/72 | HR 78 | Temp 97.8°F | Ht 67.0 in | Wt 209.0 lb

## 2023-03-23 DIAGNOSIS — Z6832 Body mass index (BMI) 32.0-32.9, adult: Secondary | ICD-10-CM

## 2023-03-23 DIAGNOSIS — R7303 Prediabetes: Secondary | ICD-10-CM

## 2023-03-23 DIAGNOSIS — E559 Vitamin D deficiency, unspecified: Secondary | ICD-10-CM

## 2023-03-23 DIAGNOSIS — E669 Obesity, unspecified: Secondary | ICD-10-CM

## 2023-03-23 DIAGNOSIS — R632 Polyphagia: Secondary | ICD-10-CM | POA: Diagnosis not present

## 2023-03-23 MED ORDER — TOPIRAMATE ER 50 MG PO CAP24
50.0000 mg | ORAL_CAPSULE | Freq: Every day | ORAL | 0 refills | Status: DC
Start: 2023-03-23 — End: 2023-04-20

## 2023-03-23 NOTE — Assessment & Plan Note (Signed)
Last vitamin D Lab Results  Component Value Date   VD25OH 25.1 (L) 12/02/2022   She has been taking RX vitamin D weekly Energy level is fair   Recheck vitamin D level next visit

## 2023-03-23 NOTE — Assessment & Plan Note (Signed)
Lab Results  Component Value Date   HGBA1C 6.1 (H) 12/02/2022   She has reduced her intake of sweets though has indulged more recently.  She had adverse SE from metformin.  She has been fairly consistent with exercise and has lost 20 lb in the past year  Repeat A1c next visit

## 2023-03-23 NOTE — Progress Notes (Signed)
Office: [phone]  /  Fax: [phone]  WEIGHT SUMMARY AND BIOMETRICS  Starting Date: 03/18/22  Starting Weight: 229lb   Weight Lost Since Last Visit: 0lb   Vitals Temp: 97.8 F (36.6 C) BP: 109/72 Pulse Rate: 78 SpO2: 98 %   Body Composition  Body Fat %: 38 % Fat Mass (lbs): 79.4 lbs Muscle Mass (lbs): 123.2 lbs Total Body Water (lbs): 81.6 lbs Visceral Fat Rating : 9   HPI  Chief Complaint: OBESITY  Sue Jackson is here to discuss her progress with her obesity treatment plan. She is on the keeping a food journal and adhering to recommended goals of 1800 calories and 100 protein and states she is following her eating plan approximately 90 % of the time. She states she is exercising 30 minutes 3 times per week.  Interval History:  Since last office visit she is down 0 lb in the past She is up 2 lb of muscle mass and is down 2.4 lb of body fat She has been working out more and has been more tired due to a prolonged menstrual cycle- has OB / gyn visit in Sept She is 'cheating' on foods sometimes but has good control She is logging her calories most day and focuses on protein with meals Her wife is also now working on weight loss but she has been bringing home [place] cookie She has a net weight loss of 20 lb in the past year This is an 8.7% TBW loss She has started to jump rope  Pharmacotherapy: Trokendi XR 50 mg once daily  PHYSICAL EXAM:  Blood pressure 109/72, pulse 78, temperature 97.8 F (36.6 C), height 5\' 7"  (1.702 m), weight 209 lb (94.8 kg), SpO2 98%. Body mass index is 32.73 kg/m.  General: She is overweight, cooperative, alert, well developed, and in no acute distress. PSYCH: Has normal mood, affect and thought process.   Lungs: Normal breathing effort, no conversational dyspnea.   ASSESSMENT AND PLAN  TREATMENT PLAN FOR OBESITY:  Recommended Dietary Goals  Sue Jackson is currently in the action stage of change. As such, her goal is to continue  weight management plan. She has agreed to keeping a food journal and adhering to recommended goals of 1800 calories and 110 g of  protein.  Behavioral Intervention  We discussed the following Behavioral Modification Strategies today: increasing lean protein intake, decreasing simple carbohydrates , increasing vegetables, increasing lower glycemic fruits, avoiding skipping meals, increasing water intake, keeping healthy foods at home, decreasing eating out or consumption of processed foods, and making healthy choices when eating convenient foods, continue to practice mindfulness when eating, and planning for success.  Additional resources provided today: NA  Recommended Physical Activity Goals  Sue Jackson has been advised to work up to 150 minutes of moderate intensity aerobic activity a week and strengthening exercises 2-3 times per week for cardiovascular health, weight loss maintenance and preservation of muscle mass.   She has agreed to [employer] strengthening exercises with a goal of 2-3 sessions a week   Pharmacotherapy changes for the treatment of obesity: Trokendi XR 50 mg once daily for concurrent migraines  ASSOCIATED CONDITIONS ADDRESSED TODAY  Polyphagia Assessment & Plan: Has improved with use of Trokendi XR 50 mg once daily without adverse SE She is not at risk for pregnancy (female partner) She has been working on [employer] protein and fiber intake with meals  Continue Trokendi XR 50 mg daily Increase water intake to 90 oz /day  Orders: -  Topiramate ER; Take 1 capsule (50 mg total) by mouth daily with supper.  Dispense: 30 capsule; Refill: 0  Vitamin D deficiency Assessment & Plan: Last vitamin D Lab Results  Component Value Date   VD25OH 25.1 (L) 12/02/2022   She has been taking RX vitamin D weekly Energy level is fair   Recheck vitamin D level next visit   Generalized obesity with starting BMI 35  BMI 32.0-32.9,adult  Prediabetes Assessment & Plan: Lab  Results  Component Value Date   HGBA1C 6.1 (H) 12/02/2022   She has reduced her intake of sweets though has indulged more recently.  She had adverse SE from metformin.  She has been fairly consistent with exercise and has lost 20 lb in the past year  Repeat A1c next visit       She was informed of the importance of frequent follow up visits to maximize her success with intensive lifestyle modifications for her multiple health conditions.   ATTESTASTION STATEMENTS:  Reviewed by clinician on day of visit: allergies, medications, problem list, medical history, surgical history, family history, social history, and previous encounter notes pertinent to obesity diagnosis.   I have personally spent 30 minutes total time today in preparation, patient care, nutritional counseling and documentation for this visit, including the following: review of clinical lab tests; review of medical tests/procedures/services.      Glennis Brink, DO DABFM, DABOM [facility] [address] [place], [place] [address] [phone]

## 2023-03-23 NOTE — Assessment & Plan Note (Signed)
Has improved with use of Trokendi XR 50 mg once daily without adverse SE She is not at risk for pregnancy (female partner) She has been working on [employer] protein and fiber intake with meals  Continue Trokendi XR 50 mg daily Increase water intake to 90 oz /day

## 2023-03-25 ENCOUNTER — Encounter: Payer: Self-pay | Admitting: Neurology

## 2023-03-27 ENCOUNTER — Telehealth: Payer: 59 | Admitting: Nurse Practitioner

## 2023-03-27 DIAGNOSIS — R42 Dizziness and giddiness: Secondary | ICD-10-CM

## 2023-03-27 MED ORDER — MECLIZINE HCL 25 MG PO TABS
25.0000 mg | ORAL_TABLET | Freq: Three times a day (TID) | ORAL | 0 refills | Status: DC | PRN
Start: 2023-03-27 — End: 2023-09-18

## 2023-03-27 NOTE — Patient Instructions (Signed)
Dimple Nanas, thank you for joining Claiborne Rigg, NP for today's virtual visit.  While this provider is not your primary care provider (PCP), if your PCP is located in our provider database this encounter information will be shared with them immediately following your visit.   A [facility] MyChart account gives you access to today's visit and all your visits, tests, and labs performed at [facility] " click here if you don't have a [facility] MyChart account or go to [email or web address]  Consent: (Patient) Sue Jackson provided verbal consent for this virtual visit at the beginning of the encounter.  Current Medications:  Current Outpatient Medications:    albuterol (VENTOLIN HFA) 108 (90 Base) MCG/ACT inhaler, Inhale 1-2 puffs into the lungs every 6 (six) hours as needed., Disp: 8 g, Rfl: 0   amLODipine-benazepril (LOTREL) 5-10 MG capsule, TAKE 1 CAPSULE BY MOUTH EVERY DAY, Disp: 90 capsule, Rfl: 1   cetirizine (ZYRTEC ALLERGY) 10 MG tablet, Take 1 tablet (10 mg total) by mouth daily., Disp: 90 tablet, Rfl: 1   cyclobenzaprine (FLEXERIL) 5 MG tablet, Take 1 tablet (5 mg total) by mouth 3 (three) times daily as needed for muscle spasms., Disp: 21 tablet, Rfl: 0   fluticasone (FLONASE) 50 MCG/ACT nasal spray, Place 2 sprays into both nostrils daily., Disp: 16 g, Rfl: 0   gabapentin (NEURONTIN) 100 MG capsule, Take 1 capsule (100 mg total) by mouth 3 (three) times daily., Disp: 90 capsule, Rfl: 3   gabapentin (NEURONTIN) 400 MG capsule, Take 1 capsule (400 mg total) by mouth 3 (three) times daily., Disp: 90 capsule, Rfl: 3   Galcanezumab-gnlm (EMGALITY) 120 MG/ML SOAJ, Inject 120 mg into the skin every 28 (twenty-eight) days., Disp: 1 mL, Rfl: 5   hydrOXYzine (ATARAX) 10 MG tablet, Take 1 tablet (10 mg total) by mouth 3 (three) times daily as needed., Disp: 90 tablet, Rfl: 3   ibuprofen (ADVIL) 600 MG tablet, Take 1 tablet (600 mg total) by mouth  every 8 (eight) hours as needed., Disp: 30 tablet, Rfl: 0   ipratropium (ATROVENT) 0.03 % nasal spray, Place 2 sprays into both nostrils every 12 (twelve) hours., Disp: 30 mL, Rfl: 0   loperamide (IMODIUM A-D) 2 MG tablet, Take 1 tablet (2 mg total) by mouth 4 (four) times daily as needed for diarrhea or loose stools., Disp: 30 tablet, Rfl: 0   meclizine (ANTIVERT) 25 MG tablet, Take 1 tablet (25 mg total) by mouth 3 (three) times daily as needed for dizziness., Disp: 30 tablet, Rfl: 0   predniSONE (DELTASONE) 20 MG tablet, Take 2 tablets (40 mg total) by mouth daily with breakfast., Disp: 10 tablet, Rfl: 0   promethazine-dextromethorphan (PROMETHAZINE-DM) 6.25-15 MG/5ML syrup, Take 5 mLs by mouth 4 (four) times daily as needed., Disp: 118 mL, Rfl: 0   propranolol (INDERAL) 40 MG tablet, TAKE 1/2 TABLET BY MOUTH DAILY, Disp: 45 tablet, Rfl: 1   SUMAtriptan (IMITREX) 50 MG tablet, Take 1 tablet (50 mg total) by mouth as needed for migraine. May repeat in 2 hours if headache persists or recurs.  Maximum 2 tablets in 24 hours., Disp: 10 tablet, Rfl: 5   Topiramate ER (TROKENDI XR) 50 MG CP24, Take 1 capsule (50 mg total) by mouth daily with supper., Disp: 30 capsule, Rfl: 0   Vitamin D, Ergocalciferol, (DRISDOL) 1.25 MG (50000 UNIT) CAPS capsule, Take 1 capsule (50,000 Units total) by mouth every 7 (seven) days., Disp: 5 capsule, Rfl: 0   Medications ordered  in this encounter:  Meds ordered this encounter  Medications   meclizine (ANTIVERT) 25 MG tablet    Sig: Take 1 tablet (25 mg total) by mouth 3 (three) times daily as needed for dizziness.    Dispense:  30 tablet    Refill:  0    Order Specific Question:   Supervising Provider    Answer:   Merrilee Jansky [id number]     *If you need refills on other medications prior to your next appointment, please contact your pharmacy*  Follow-Up: Call back or seek an in-person evaluation if the symptoms worsen or if the condition fails to improve as  anticipated.  [facility] [phone]  Other Instructions Follow up with neurology   If you have been instructed to have an in-person evaluation today at a local Urgent Care facility, please use the link below. It will take you to a list of all of our available [facility], including address, phone number and hours of operation. Please do not delay care.  [facility]  If you or a family member do not have a primary care provider, use the link below to schedule a visit and establish care. When you choose a [facility] primary care physician or advanced practice provider, you gain a long-term partner in health. Find a Primary Care Provider  Learn more about [facility]'s in-office and virtual care options: [facility] Get Care Now

## 2023-03-27 NOTE — Progress Notes (Signed)
Virtual Visit Consent   Sue Jackson, you are scheduled for a virtual visit with a [facility] provider today. Just as with appointments in the office, your consent must be obtained to participate. Your consent will be active for this visit and any virtual visit you may have with one of our providers in the next 365 days. If you have a MyChart account, a copy of this consent can be sent to you electronically.  As this is a virtual visit, video technology does not allow for your provider to perform a traditional examination. This may limit your provider's ability to fully assess your condition. If your provider identifies any concerns that need to be evaluated in person or the need to arrange testing (such as labs, EKG, etc.), we will make arrangements to do so. Although advances in technology are sophisticated, we cannot ensure that it will always work on either your end or our end. If the connection with a video visit is poor, the visit may have to be switched to a telephone visit. With either a video or telephone visit, we are not always able to ensure that we have a secure connection.  By engaging in this virtual visit, you consent to the provision of healthcare and authorize for your insurance to be billed (if applicable) for the services provided during this visit. Depending on your insurance coverage, you may receive a charge related to this service.  I need to obtain your verbal consent now. Are you willing to proceed with your visit today? Sue Jackson has provided verbal consent on 03/27/2023 for a virtual visit (video or telephone). Sue Rigg, NP  Date: 03/27/2023 2:23 PM  Virtual Visit via Video Note   I, Sue Jackson, connected with  Sue Jackson  ([id number], 10-13-1980) on 03/27/23 at  2:30 PM EDT by a video-enabled telemedicine application and verified that I am speaking with the correct person using two identifiers.  Location: Patient:  Virtual Visit Location Patient: Home Provider: Virtual Visit Location Provider: Home Office   I discussed the limitations of evaluation and management by telemedicine and the availability of in person appointments. The patient expressed understanding and agreed to proceed.    History of Present Illness: Sue Jackson is a 42 y.o. who identifies as a female who was assigned female at birth, and is being seen today for vertigo.   Sue Jackson states she is being followed by neurology for vertigo and migraines and she is currently experiencing vertigo that is permitting her from going to work today. She is requesting a work note.    Problems:  Patient Active Problem List   Diagnosis Date Noted   Generalized obesity with starting BMI 35 09/22/2022   Headache, unspecified headache type 08/14/2022   Vestibular migraine 08/13/2022   RUQ abdominal pain 08/12/2022   Polyphagia 07/29/2022   Postoperative state 07/06/2022   Fibroids 05/19/2022   Uterine leiomyoma 04/20/2022   Pre-diabetes 04/20/2022   Vitamin D deficiency 04/01/2022   Insulin resistance 04/01/2022   Depression 04/01/2022   Malignant neoplasm of right female breast, unspecified estrogen receptor status, unspecified site of breast (HCC) 08/13/2021   Genetic testing 08/20/2020   Family history of breast cancer    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   Bipolar 2 disorder (HCC) 11/16/2019   Prediabetes 08/15/2018   Anxiety 02/06/2018   Low back pain radiating to right lower extremity 07/06/2017   Upper back pain, chronic 07/06/2017  Hypertension, essential, benign 03/11/2017    Allergies: No Known Allergies Medications:  Current Outpatient Medications:    albuterol (VENTOLIN HFA) 108 (90 Base) MCG/ACT inhaler, Inhale 1-2 puffs into the lungs every 6 (six) hours as needed., Disp: 8 g, Rfl: 0   amLODipine-benazepril (LOTREL) 5-10 MG capsule, TAKE 1 CAPSULE BY MOUTH EVERY DAY, Disp: 90  capsule, Rfl: 1   cetirizine (ZYRTEC ALLERGY) 10 MG tablet, Take 1 tablet (10 mg total) by mouth daily., Disp: 90 tablet, Rfl: 1   cyclobenzaprine (FLEXERIL) 5 MG tablet, Take 1 tablet (5 mg total) by mouth 3 (three) times daily as needed for muscle spasms., Disp: 21 tablet, Rfl: 0   fluticasone (FLONASE) 50 MCG/ACT nasal spray, Place 2 sprays into both nostrils daily., Disp: 16 g, Rfl: 0   gabapentin (NEURONTIN) 100 MG capsule, Take 1 capsule (100 mg total) by mouth 3 (three) times daily., Disp: 90 capsule, Rfl: 3   gabapentin (NEURONTIN) 400 MG capsule, Take 1 capsule (400 mg total) by mouth 3 (three) times daily., Disp: 90 capsule, Rfl: 3   Galcanezumab-gnlm (EMGALITY) 120 MG/ML SOAJ, Inject 120 mg into the skin every 28 (twenty-eight) days., Disp: 1 mL, Rfl: 5   hydrOXYzine (ATARAX) 10 MG tablet, Take 1 tablet (10 mg total) by mouth 3 (three) times daily as needed., Disp: 90 tablet, Rfl: 3   ibuprofen (ADVIL) 600 MG tablet, Take 1 tablet (600 mg total) by mouth every 8 (eight) hours as needed., Disp: 30 tablet, Rfl: 0   ipratropium (ATROVENT) 0.03 % nasal spray, Place 2 sprays into both nostrils every 12 (twelve) hours., Disp: 30 mL, Rfl: 0   loperamide (IMODIUM A-D) 2 MG tablet, Take 1 tablet (2 mg total) by mouth 4 (four) times daily as needed for diarrhea or loose stools., Disp: 30 tablet, Rfl: 0   meclizine (ANTIVERT) 25 MG tablet, Take 1 tablet (25 mg total) by mouth 3 (three) times daily as needed for dizziness., Disp: 30 tablet, Rfl: 0   predniSONE (DELTASONE) 20 MG tablet, Take 2 tablets (40 mg total) by mouth daily with breakfast., Disp: 10 tablet, Rfl: 0   promethazine-dextromethorphan (PROMETHAZINE-DM) 6.25-15 MG/5ML syrup, Take 5 mLs by mouth 4 (four) times daily as needed., Disp: 118 mL, Rfl: 0   propranolol (INDERAL) 40 MG tablet, TAKE 1/2 TABLET BY MOUTH DAILY, Disp: 45 tablet, Rfl: 1   SUMAtriptan (IMITREX) 50 MG tablet, Take 1 tablet (50 mg total) by mouth as needed for migraine.  May repeat in 2 hours if headache persists or recurs.  Maximum 2 tablets in 24 hours., Disp: 10 tablet, Rfl: 5   Topiramate ER (TROKENDI XR) 50 MG CP24, Take 1 capsule (50 mg total) by mouth daily with supper., Disp: 30 capsule, Rfl: 0   Vitamin D, Ergocalciferol, (DRISDOL) 1.25 MG (50000 UNIT) CAPS capsule, Take 1 capsule (50,000 Units total) by mouth every 7 (seven) days., Disp: 5 capsule, Rfl: 0  Observations/Objective: Patient is well-developed, well-nourished in no acute distress.  Resting comfortably at home.  Head is normocephalic, atraumatic.  No labored breathing.  Speech is clear and coherent with logical content.  Patient is alert and oriented at baseline.    Assessment and Plan: 1. Vertigo - meclizine (ANTIVERT) 25 MG tablet; Take 1 tablet (25 mg total) by mouth 3 (three) times daily as needed for dizziness.  Dispense: 30 tablet; Refill: 0  Follow up with neurology  Follow Up Instructions: I discussed the assessment and treatment plan with the patient. The patient was provided  an opportunity to ask questions and all were answered. The patient agreed with the plan and demonstrated an understanding of the instructions.  A copy of instructions were sent to the patient via MyChart unless otherwise noted below.    The patient was advised to call back or seek an in-person evaluation if the symptoms worsen or if the condition fails to improve as anticipated.  Time:  I spent 11 minutes with the patient via telehealth technology discussing the above problems/concerns.    Sue Rigg, NP

## 2023-03-30 ENCOUNTER — Ambulatory Visit (INDEPENDENT_AMBULATORY_CARE_PROVIDER_SITE_OTHER): Payer: 59 | Admitting: Clinical

## 2023-03-30 DIAGNOSIS — F3181 Bipolar II disorder: Secondary | ICD-10-CM

## 2023-03-31 ENCOUNTER — Encounter: Payer: Self-pay | Admitting: Neurology
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
## 2023-04-04 ENCOUNTER — Encounter: Payer: Self-pay | Admitting: Neurology

## 2023-04-04 NOTE — Progress Notes (Unsigned)
Virtual Visit via Video Note  Consent was obtained for video visit:  Yes.   Answered questions that patient had about telehealth interaction:  Yes.   I discussed the limitations, risks, security and privacy concerns of performing an evaluation and management service by telemedicine. I also discussed with the patient that there may be a patient responsible charge related to this service. The patient expressed understanding and agreed to proceed.  Pt location: Home Physician Location: office Name of referring provider:  [place], Betty G, MD I connected with Sue Jackson at patients initiation/request on 04/06/2023 at  1:50 PM EDT by video enabled telemedicine application and verified that I am speaking with the correct person using two identifiers. Pt MRN:  [id number] Pt DOB:  Sep 25, 1980 Video Participants:  Sue Jackson   Assessment/Plan:   Vestibular migraine - worse over the summer, likely related to the heat and weather and aggravated by anxiety.   Migraine prevention:  She would like to continue Emgality for now and see if improves into the autumn.  If not, plan to change Emgality to another CGRP inhibitor. Migraine rescue:  Sumatriptan 50mg  and meclizine if needed  Limit use of meclizine to no more than 2 days out of week Keep headache diary Follow up 3 months     Subjective:  Sue Jackson is a 42 year old female with HTN, DCIS, Bipolar disorder, anxiety who follows up for vestibular migraine.  UPDATE: Increased migraines with vertigo this summer, weather-related/heat and anxiety.  The [facility] doesn't work too well at work in the [employer].   Intensity:  moderate  Duration:  a day. Frequency:   Current NSAIDS/analgesics:  none Current triptans:  sumatriptan 50mg  Current ergotamine:  none Current anti-emetic:  none Current muscle relaxants:  none Current Antihypertensive medications:  amlodipine-benazepril Current Antidepressant medications:   none Current Anticonvulsant medications:  Trokendi XR 50mg  daily (higher doses cause side effects), gabapentin 400mg  TID (for anxiety) Current anti-CGRP:  Emgality Current Vitamins/Herbal/Supplements: none Current Antihistamines/Decongestants:  meclizine, Zyrtec, Flonase Other therapy:  none Hormone/birth control:  none Other medications:  none  Caffeine:  decaf coffee Alcohol:  rarely Smoker:  sometimes marijuana.  No cigarettes Diet:  No soda.  Tries to drink water Exercise:  trying to increase Depression:  stable; Anxiety:  yes Other pain:  back pain/arthritis Sleep hygiene:  okay - sometimes anxiety may case insomnia. Works for [employer] service  HISTORY:  For many years she has been experiencing dizziness.  She describes an undulating sensation associated with photophobia, phonophobia, and sometimes nausea and stuttering speech but no visual disturbance, vomiting, numbness or weakness.  Rarely may have an occipital pressure headache.  Usually lasts a day.  Progressively have gotten worse over the past few years, now occurring daily.  Triggers include anxiety, chocolate, nuts, citrus fruits, spicy foods, dairy, skipped meals, sleep deprivation, and heat.  Resting in cool dark and quiet room helps.  She saw ENT who told her that she has vestibular migraines.  Takes meclizine which helps.  Used to take frequently but now no more than once a week.   MRI of brain without contrast on 06/22/2020 was normal.    Past NSAIDS/analgesics:  meloxicam Past abortive triptans:  none Past abortive ergotamine:  none Past muscle relaxants:  Flexeril Past anti-emetic:  none Past antihypertensive medications:  propranolol Past antidepressant medications:  sertraline, citalopram, fluoxetine Past anticonvulsant medications:  topiramate (side effects) Past anti-CGRP:  none.  WOULD NOT USE AIMOVIG DUE TO HYPERTENSION  Past vitamins/Herbal/Supplements:  none Past antihistamines/decongestants:  none Other  past therapies:  chiropractor    Family history of headache:  sister (migraines), other sister (migraines, vertigo)   Past Medical History: 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   Cancer (HCC) 2021   Depression    Family history of breast cancer    Headache 2021   Hypertension    Obesity    Personal history of radiation therapy    Polyphagia    Pre-diabetes    Vitamin D deficiency     Medications: Outpatient Encounter Medications as of 04/06/2023  Medication Sig Note   albuterol (VENTOLIN HFA) 108 (90 Base) MCG/ACT inhaler Inhale 1-2 puffs into the lungs every 6 (six) hours as needed.    amLODipine-benazepril (LOTREL) 5-10 MG capsule TAKE 1 CAPSULE BY MOUTH EVERY DAY    cetirizine (ZYRTEC ALLERGY) 10 MG tablet Take 1 tablet (10 mg total) by mouth daily. 12/16/2021: As needed   cyclobenzaprine (FLEXERIL) 5 MG tablet Take 1 tablet (5 mg total) by mouth 3 (three) times daily as needed for muscle spasms.    fluticasone (FLONASE) 50 MCG/ACT nasal spray Place 2 sprays into both nostrils daily.    gabapentin (NEURONTIN) 100 MG capsule Take 1 capsule (100 mg total) by mouth 3 (three) times daily.    gabapentin (NEURONTIN) 400 MG capsule Take 1 capsule (400 mg total) by mouth 3 (three) times daily.    Galcanezumab-gnlm (EMGALITY) 120 MG/ML SOAJ Inject 120 mg into the skin every 28 (twenty-eight) days.    hydrOXYzine (ATARAX) 10 MG tablet Take 1 tablet (10 mg total) by mouth 3 (three) times daily as needed.    ibuprofen (ADVIL) 600 MG tablet Take 1 tablet (600 mg total) by mouth every 8 (eight) hours as needed.    ipratropium (ATROVENT) 0.03 % nasal spray Place 2 sprays into both nostrils every 12 (twelve) hours.    loperamide (IMODIUM A-D) 2 MG tablet Take 1 tablet (2 mg total) by mouth 4 (four) times daily as needed for diarrhea or loose stools.    meclizine (ANTIVERT) 25 MG  tablet Take 1 tablet (25 mg total) by mouth 3 (three) times daily as needed for dizziness.    predniSONE (DELTASONE) 20 MG tablet Take 2 tablets (40 mg total) by mouth daily with breakfast.    promethazine-dextromethorphan (PROMETHAZINE-DM) 6.25-15 MG/5ML syrup Take 5 mLs by mouth 4 (four) times daily as needed.    propranolol (INDERAL) 40 MG tablet TAKE 1/2 TABLET BY MOUTH DAILY    SUMAtriptan (IMITREX) 50 MG tablet Take 1 tablet (50 mg total) by mouth as needed for migraine. May repeat in 2 hours if headache persists or recurs.  Maximum 2 tablets in 24 hours.    Topiramate ER (TROKENDI XR) 50 MG CP24 Take 1 capsule (50 mg total) by mouth daily with supper.    Vitamin D, Ergocalciferol, (DRISDOL) 1.25 MG (50000 UNIT) CAPS capsule Take 1 capsule (50,000 Units total) by mouth every 7 (seven) days.    No facility-administered encounter medications on file as of 04/06/2023.    Allergies: No Known Allergies  Family History: Family History  Problem Relation Age of Onset   Arthritis Mother    Asthma Mother    Cirrhosis Father        d. 53   Depression Maternal Aunt    Hypertension  Maternal Aunt    Breast cancer Maternal Aunt 51   Breast cancer Maternal Aunt 59   Breast cancer Paternal Aunt    Dementia Maternal Grandmother    Diabetes Maternal Grandfather    Heart disease Maternal Grandfather    Hypertension Maternal Grandfather    Stroke Maternal Grandfather    Breast cancer Cousin        pat first cousin    Observations/Objective:   No acute distress.  Alert and oriented.  Speech fluent and not dysarthric.  Language intact.  Eyes orthophoric on primary gaze.  Face symmetric.   Follow Up Instructions:    -I discussed the assessment and treatment plan with the patient. The patient was provided an opportunity to ask questions and all were answered. The patient agreed with the plan and demonstrated an understanding of the instructions.   The patient was advised to call back or seek  an in-person evaluation if the symptoms worsen or if the condition fails to improve as anticipated.   Shon Millet, DO  CC: Betty [place], MD

## 2023-04-06 ENCOUNTER — Telehealth: Payer: Self-pay | Admitting: Neurology

## 2023-04-06 ENCOUNTER — Encounter: Payer: Self-pay | Admitting: Neurology

## 2023-04-06 ENCOUNTER — Telehealth (INDEPENDENT_AMBULATORY_CARE_PROVIDER_SITE_OTHER): Payer: 59 | Admitting: Neurology

## 2023-04-06 DIAGNOSIS — G43809 Other migraine, not intractable, without status migrainosus: Secondary | ICD-10-CM | POA: Diagnosis not present

## 2023-04-06 DIAGNOSIS — G43001 Migraine without aura, not intractable, with status migrainosus: Secondary | ICD-10-CM

## 2023-04-06 NOTE — Telephone Encounter (Signed)
No call made. Patient seen today.

## 2023-04-06 NOTE — Telephone Encounter (Signed)
Patient states that she received a call and was just calling back

## 2023-04-07 ENCOUNTER — Telehealth: Payer: Self-pay | Admitting: Neurology

## 2023-04-07 NOTE — Telephone Encounter (Signed)
Caller stated she needs fax resent. Front and back with conformation page from 04/01/23. Caller stated pages were missing from fax

## 2023-04-07 NOTE — Telephone Encounter (Signed)
Caller stated she needs fax resent. Front and back with conformation page from original fax on 04/01/23. Stated pages were missing from fax

## 2023-04-08 ENCOUNTER — Telehealth: Payer: Self-pay | Admitting: Neurology

## 2023-04-08 ENCOUNTER — Encounter: Payer: Self-pay | Admitting: Neurology

## 2023-04-08 NOTE — Telephone Encounter (Signed)
Re-faxed.

## 2023-04-08 NOTE — Telephone Encounter (Signed)
Patient called needing the FLMA papers to be emailed or if she could get a call back. A paper was missing when they were sent to the HR department

## 2023-04-08 NOTE — Telephone Encounter (Signed)
Paperwork copied to one sided. And refaxed at 2 pm.with transmission successful.

## 2023-04-20 ENCOUNTER — Ambulatory Visit: Payer: 59 | Admitting: Family Medicine

## 2023-04-20 ENCOUNTER — Encounter: Payer: Self-pay | Admitting: Family Medicine

## 2023-04-20 ENCOUNTER — Telehealth (INDEPENDENT_AMBULATORY_CARE_PROVIDER_SITE_OTHER): Payer: 59 | Admitting: Family Medicine

## 2023-04-20 ENCOUNTER — Ambulatory Visit (INDEPENDENT_AMBULATORY_CARE_PROVIDER_SITE_OTHER): Payer: 59 | Admitting: Clinical

## 2023-04-20 ENCOUNTER — Encounter (HOSPITAL_COMMUNITY): Payer: Self-pay

## 2023-04-20 VITALS — BP 114/74 | HR 60 | Temp 97.6°F | Ht 67.0 in | Wt 211.0 lb

## 2023-04-20 DIAGNOSIS — F3181 Bipolar II disorder: Secondary | ICD-10-CM | POA: Diagnosis not present

## 2023-04-20 DIAGNOSIS — R632 Polyphagia: Secondary | ICD-10-CM

## 2023-04-20 DIAGNOSIS — E669 Obesity, unspecified: Secondary | ICD-10-CM

## 2023-04-20 DIAGNOSIS — I10 Essential (primary) hypertension: Secondary | ICD-10-CM

## 2023-04-20 DIAGNOSIS — Z6833 Body mass index (BMI) 33.0-33.9, adult: Secondary | ICD-10-CM

## 2023-04-20 DIAGNOSIS — D500 Iron deficiency anemia secondary to blood loss (chronic): Secondary | ICD-10-CM

## 2023-04-20 DIAGNOSIS — R7303 Prediabetes: Secondary | ICD-10-CM

## 2023-04-20 DIAGNOSIS — R5383 Other fatigue: Secondary | ICD-10-CM

## 2023-04-20 DIAGNOSIS — E559 Vitamin D deficiency, unspecified: Secondary | ICD-10-CM

## 2023-04-20 DIAGNOSIS — Z87891 Personal history of nicotine dependence: Secondary | ICD-10-CM

## 2023-04-20 MED ORDER — VITAMIN D (ERGOCALCIFEROL) 1.25 MG (50000 UNIT) PO CAPS
50000.0000 [IU] | ORAL_CAPSULE | ORAL | 0 refills | Status: DC
Start: 2023-04-20 — End: 2023-05-18

## 2023-04-20 MED ORDER — TOPIRAMATE ER 50 MG PO CAP24
50.0000 mg | ORAL_CAPSULE | Freq: Every day | ORAL | 0 refills | Status: DC
Start: 2023-04-20 — End: 2023-05-18

## 2023-04-20 NOTE — Assessment & Plan Note (Signed)
Last vitamin D Lab Results  Component Value Date   VD25OH 25.1 (L) 12/02/2022   She has been taking RX vitamin D weekly Energy level has improved  Recheck level today

## 2023-04-20 NOTE — Assessment & Plan Note (Signed)
She has been feeling more stressed and depressed lately She is not a risk to harm herself or others She has reduced her intake of gabapentin due to dizziness  Recommend seeking care at [facility] with Dr Gaynell Face, number provided

## 2023-04-20 NOTE — Progress Notes (Signed)
Office: [phone]  /  Fax: [phone]  WEIGHT SUMMARY AND BIOMETRICS  Starting Date: 03/18/22  Starting Weight: 229lb   Weight Lost Since Last Visit: 0lb   Vitals Temp: 97.6 F (36.4 C) BP: 114/74 Pulse Rate: 60 SpO2: 100 %   Body Composition  Body Fat %: 38.4 % Fat Mass (lbs): 81.4 lbs Muscle Mass (lbs): 123.8 lbs Total Body Water (lbs): 81 lbs Visceral Fat Rating : 9    HPI  Chief Complaint: OBESITY  Sue Jackson is here to discuss her progress with her obesity treatment plan. She is on the keeping a food journal and adhering to recommended goals of 1800 calories and 100 protein and states she is following her eating plan approximately 70 % of the time. She states she is exercising 0 minutes 0 times per week.  Interval History:  Since last office visit she is up 2 lb She has been struggling with meal planning and poor food choices She has had more work stress and less time to prepare meals She is dog walking in the evenings  She has felt more anxious lately, still looking for a psychiatrist She feels some improvements with food impulse control on Topiramate She has not been working out as much but she is walking consistently She is up 0.6 lb of muscle mass and up 2 lb of body fat in the past month Her net weight loss is 18 lb in the past 13 mos Her spouse has not been supportive, continuing to offer sweets but she has been doing a fair job of turning them down  Pharmacotherapy: topiramate 50 mg daily  PHYSICAL EXAM:  Blood pressure 114/74, pulse 60, temperature 97.6 F (36.4 C), height 5\' 7"  (1.702 m), weight 211 lb (95.7 kg), SpO2 100%. Body mass index is 33.05 kg/m.  General: She is overweight, cooperative, alert, well developed, and in no acute distress. PSYCH: Has normal mood, affect and thought process.   Lungs: Normal breathing effort, no conversational dyspnea.   ASSESSMENT AND PLAN  TREATMENT PLAN FOR OBESITY:  Recommended Dietary  Goals  Illana is currently in the action stage of change. As such, her goal is to continue weight management plan. She has agreed to keeping a food journal and adhering to recommended goals of 1800 calories and 100 g of  protein. - handout provided for premade meal options to aid in dinner planning  Behavioral Intervention  We discussed the following Behavioral Modification Strategies today: increasing lean protein intake, decreasing simple carbohydrates , increasing vegetables, increasing lower glycemic fruits, avoiding skipping meals, increasing water intake, work on meal planning and preparation, keeping healthy foods at home, avoiding temptations and identifying enticing environmental cues, continue to practice mindfulness when eating, and planning for success.  Additional resources provided today: NA  Recommended Physical Activity Goals  Frimet has been advised to work up to 150 minutes of moderate intensity aerobic activity a week and strengthening exercises 2-3 times per week for cardiovascular health, weight loss maintenance and preservation of muscle mass.   She has agreed to Start aerobic activity with a goal of 150 minutes a week at moderate intensity.  - track daily steps -plan to restart weight training 2 days/ wk as time allows  Pharmacotherapy changes for the treatment of obesity: none  ASSOCIATED CONDITIONS ADDRESSED TODAY  Prediabetes Assessment & Plan: Lab Results  Component Value Date   HGBA1C 6.1 (H) 12/02/2022   Previously intolerant to metformin She is actively working on dietary change and exercise  Repeat A1c today  Orders: -     Insulin, random -     Hemoglobin A1c  Polyphagia Assessment & Plan: Improving on Topiramate ER 50 mg in the evening Consistently eating protein with meals Hydrating well with water  Check chemistry panel today  Orders: -     Topiramate ER; Take 1 capsule (50 mg total) by mouth daily with supper.  Dispense: 30 capsule;  Refill: 0  Vitamin D deficiency Assessment & Plan: Last vitamin D Lab Results  Component Value Date   VD25OH 25.1 (L) 12/02/2022   She has been taking RX vitamin D weekly Energy level has improved  Recheck level today  Orders: -     Vitamin D (Ergocalciferol); Take 1 capsule (50,000 Units total) by mouth every 7 (seven) days.  Dispense: 5 capsule; Refill: 0 -     VITAMIN D 25 Hydroxy (Vit-D Deficiency, Fractures)  Other fatigue -     Vitamin B12 -     TSH Rfx on Abnormal to Free T4 -     Ferritin -     CBC  Hypertension, essential, benign Assessment & Plan: BP well controlled today on amlodopine - benazepril 5/10 mg daily. Denies HA or CP  Continue current BP meds Follow for improvements with weight loss  Orders: -     Lipid panel -     Comprehensive metabolic panel  Generalized obesity with starting BMI 35  BMI 33.0-33.9,adult  Bipolar 2 disorder (HCC) Assessment & Plan: She has been feeling more stressed and depressed lately She is not a risk to harm herself or others She has reduced her intake of gabapentin due to dizziness  Recommend seeking care at [facility] with Dr Gaynell Face, number provided       She was informed of the importance of frequent follow up visits to maximize her success with intensive lifestyle modifications for her multiple health conditions.   ATTESTASTION STATEMENTS:  Reviewed by clinician on day of visit: allergies, medications, problem list, medical history, surgical history, family history, social history, and previous encounter notes pertinent to obesity diagnosis.   I have personally spent 30 minutes total time today in preparation, patient care, nutritional counseling and documentation for this visit, including the following: review of clinical lab tests; review of medical tests/procedures/services.      Glennis Brink, DO DABFM, DABOM [facility] [address] [place], [place]  [address] [phone]

## 2023-04-20 NOTE — Progress Notes (Signed)
THERAPIST PROGRESS NOTE Virtual Visit via Video Note  I connected with Sue Jackson on 04/20/23 at  1:00 PM EDT by a video enabled telemedicine application and verified that I am speaking with the correct person using two identifiers.  Location: Patient: Home Provider: Office   I discussed the limitations of evaluation and management by telemedicine and the availability of in person appointments. The patient expressed understanding and agreed to proceed.   Follow Up Instructions: I discussed the assessment and treatment plan with the patient. The patient was provided an opportunity to ask questions and all were answered. The patient agreed with the plan and demonstrated an understanding of the instructions.   The patient was advised to call back or seek an in-person evaluation if the symptoms worsen or if the condition fails to improve as anticipated.   Session Time: 45 minutes  Participation Level: Active  Behavioral Response: CasualAlertAnxious  Type of Therapy: Individual Therapy  Treatment Goals addressed: Client will practice problem-solving skills 3 times per week for the next 4 weeks  ProgressTowards Goals: Progressing  Interventions: CBT and Supportive  Summary:  Sue Jackson is a 42 y.o. female who presents for the scheduled appointment oriented x 5, appropriately dressed, and friendly.  Client denied hallucinations and delusions. Client reported on today she has been having issues with anxiety.  Client reported she is going to look for a new psychiatrist to find other medications that can help that will not interact with her weight loss medications.  Client reported she has been struggling with worrying and feeling overwhelmed a lot.  Client stated "I do not feel at peace".  Client reported she is worried about her health and work is not going well.  Client reported her job tries to assign her and other people to do more work which in turn to  this causes her to feel stressed.  Client reported there has been a lot of illegal activity going on and stressed with other coworkers that sometimes she does not know how to handle it.  Client reported she has not been able to sleep and she is tossing and turning.  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 Evidence of progress towards goal: Client reported she needs help with reinforcing 2 skills of how to manage anxiety and her emotions.  Suicidal/Homicidal: Nowithout intent/plan  Therapist Response:  Therapist again the appointment asking the client how she has been doing since last seen. Therapist used CBT to engage using active listening and positive emotional support towards her thoughts and feelings. Therapist used CBT to ask the client open-ended questions about the source of her anxiety. Therapist used CBT to engage with the client and normalize her emotions compared to the stressor within reason. Therapist used CBT to engage in teach the client about mindfulness, self-care habits to help mitigate anxiety symptoms, and how to process negative anxious thoughts. Therapist used CBT ask the client to identify her progress with frequency of use with coping skills with continued practice in her daily activity.    Therapist assigned client homework to work on the mindfulness skills discussed.    Plan: Return again in 3 weeks.  Diagnosis: Bipolar 2 disorder  Collaboration of Care: Patient refused AEB none requested by the client.  Patient/Guardian was advised Release of Information must be obtained prior to any record release in order to collaborate their care with an outside provider. Patient/Guardian was advised if they have  not already done so to contact the registration department to sign all necessary forms in order for [place] to release information regarding their care.   Consent: Patient/Guardian gives verbal consent for treatment and  assignment of benefits for services provided during this visit. Patient/Guardian expressed understanding and agreed to proceed.   Neena Rhymes Taler Kushner, LCSW 04/20/2023

## 2023-04-20 NOTE — Assessment & Plan Note (Signed)
Improving on Topiramate ER 50 mg in the evening Consistently eating protein with meals Hydrating well with water  Check chemistry panel today

## 2023-04-20 NOTE — Assessment & Plan Note (Signed)
BP well controlled today on amlodopine - benazepril 5/10 mg daily. Denies HA or CP  Continue current BP meds Follow for improvements with weight loss

## 2023-04-20 NOTE — Assessment & Plan Note (Signed)
Lab Results  Component Value Date   HGBA1C 6.1 (H) 12/02/2022   Previously intolerant to metformin She is actively working on dietary change and exercise  Repeat A1c today

## 2023-04-21 LAB — FERRITIN: Ferritin: 8 ng/mL — ABNORMAL LOW (ref 15–150)

## 2023-04-21 LAB — COMPREHENSIVE METABOLIC PANEL
ALT: 18 IU/L (ref 0–32)
AST: 12 IU/L (ref 0–40)
Albumin: 3.9 g/dL (ref 3.9–4.9)
Alkaline Phosphatase: 69 IU/L (ref 44–121)
BUN/Creatinine Ratio: 20 (ref 9–23)
BUN: 17 mg/dL (ref 6–24)
Bilirubin Total: 0.2 mg/dL (ref 0.0–1.2)
CO2: 20 mmol/L (ref 20–29)
Calcium: 8.8 mg/dL (ref 8.7–10.2)
Chloride: 104 mmol/L (ref 96–106)
Creatinine, Ser: 0.83 mg/dL (ref 0.57–1.00)
Globulin, Total: 2.4 g/dL (ref 1.5–4.5)
Glucose: 104 mg/dL — ABNORMAL HIGH (ref 70–99)
Potassium: 4.2 mmol/L (ref 3.5–5.2)
Sodium: 137 mmol/L (ref 134–144)
Total Protein: 6.3 g/dL (ref 6.0–8.5)
eGFR: 89 mL/min/{1.73_m2} (ref >59)

## 2023-04-21 LAB — LIPID PANEL
Chol/HDL Ratio: 2.3 ratio (ref 0.0–4.4)
Cholesterol, Total: 148 mg/dL (ref 100–199)
HDL: 63 mg/dL (ref >39)
LDL Chol Calc (NIH): 72 mg/dL (ref 0–99)
Triglycerides: 61 mg/dL (ref 0–149)
VLDL Cholesterol Cal: 13 mg/dL (ref 5–40)

## 2023-04-21 LAB — VITAMIN D 25 HYDROXY (VIT D DEFICIENCY, FRACTURES): Vit D, 25-Hydroxy: 44.4 ng/mL (ref 30.0–100.0)

## 2023-04-21 LAB — CBC
Hematocrit: 45.2 % (ref 34.0–46.6)
Hemoglobin: 13.5 g/dL (ref 11.1–15.9)
MCH: 24.9 pg — ABNORMAL LOW (ref 26.6–33.0)
MCHC: 29.9 g/dL — ABNORMAL LOW (ref 31.5–35.7)
MCV: 83 fL (ref 79–97)
Platelets: 310 10*3/uL (ref 150–450)
RBC: 5.43 x10E6/uL — ABNORMAL HIGH (ref 3.77–5.28)
RDW: 16.4 % — ABNORMAL HIGH (ref 11.7–15.4)
WBC: 7.4 10*3/uL (ref 3.4–10.8)

## 2023-04-21 LAB — HEMOGLOBIN A1C
Est. average glucose Bld gHb Est-mCnc: 134 mg/dL
Hgb A1c MFr Bld: 6.3 % — ABNORMAL HIGH (ref 4.8–5.6)

## 2023-04-21 LAB — VITAMIN B12: Vitamin B-12: 797 pg/mL (ref 232–1245)

## 2023-04-21 LAB — TSH RFX ON ABNORMAL TO FREE T4: TSH: 2.21 u[IU]/mL (ref 0.450–4.500)

## 2023-04-21 LAB — INSULIN, RANDOM: INSULIN: 17.6 u[IU]/mL (ref 2.6–24.9)

## 2023-04-21 MED ORDER — FERROUS GLUCONATE 324 (38 FE) MG PO TABS
324.0000 mg | ORAL_TABLET | Freq: Every day | ORAL | 1 refills | Status: DC
Start: 2023-04-21 — End: 2023-07-14

## 2023-04-21 NOTE — Addendum Note (Signed)
Addended by: Glennis Brink on: 04/21/2023 03:33 PM   Modules accepted: Orders

## 2023-04-26 ENCOUNTER — Other Ambulatory Visit (HOSPITAL_COMMUNITY): Payer: Self-pay | Admitting: Psychiatry

## 2023-04-26 DIAGNOSIS — F419 Anxiety disorder, unspecified: Secondary | ICD-10-CM

## 2023-04-27 LAB — HM MAMMOGRAPHY

## 2023-05-10 ENCOUNTER — Other Ambulatory Visit (HOSPITAL_COMMUNITY): Payer: Self-pay | Admitting: Psychiatry

## 2023-05-10 DIAGNOSIS — F419 Anxiety disorder, unspecified: Secondary | ICD-10-CM

## 2023-05-11 ENCOUNTER — Other Ambulatory Visit: Payer: Self-pay | Admitting: Neurology

## 2023-05-11 ENCOUNTER — Ambulatory Visit (HOSPITAL_COMMUNITY): Admitting: Clinical

## 2023-05-11 ENCOUNTER — Telehealth (HOSPITAL_COMMUNITY): Payer: Self-pay | Admitting: Clinical

## 2023-05-11 ENCOUNTER — Encounter (HOSPITAL_COMMUNITY): Payer: Self-pay

## 2023-05-11 NOTE — Telephone Encounter (Signed)
Client did not check in using the link sent to her for the video visit. Therapist called the clients phone. Client did not answer. Therapist was unable to leave a voicemail because the client mailbox was full.

## 2023-05-13 ENCOUNTER — Other Ambulatory Visit: Payer: Self-pay | Admitting: Family Medicine

## 2023-05-13 DIAGNOSIS — D500 Iron deficiency anemia secondary to blood loss (chronic): Secondary | ICD-10-CM

## 2023-05-18 ENCOUNTER — Encounter: Payer: Self-pay | Admitting: Family Medicine

## 2023-05-18 ENCOUNTER — Ambulatory Visit: Payer: 59 | Admitting: Family Medicine

## 2023-05-18 VITALS — BP 136/86 | HR 61 | Temp 97.6°F | Ht 67.0 in | Wt 208.0 lb

## 2023-05-18 DIAGNOSIS — R632 Polyphagia: Secondary | ICD-10-CM

## 2023-05-18 DIAGNOSIS — R7303 Prediabetes: Secondary | ICD-10-CM

## 2023-05-18 DIAGNOSIS — D500 Iron deficiency anemia secondary to blood loss (chronic): Secondary | ICD-10-CM | POA: Insufficient documentation

## 2023-05-18 DIAGNOSIS — E66811 Obesity, class 1: Secondary | ICD-10-CM

## 2023-05-18 DIAGNOSIS — E6609 Other obesity due to excess calories: Secondary | ICD-10-CM

## 2023-05-18 DIAGNOSIS — E559 Vitamin D deficiency, unspecified: Secondary | ICD-10-CM | POA: Diagnosis not present

## 2023-05-18 DIAGNOSIS — Z6832 Body mass index (BMI) 32.0-32.9, adult: Secondary | ICD-10-CM

## 2023-05-18 MED ORDER — RYBELSUS 3 MG PO TABS
ORAL_TABLET | ORAL | 0 refills | Status: DC
Start: 2023-05-18 — End: 2023-08-17

## 2023-05-18 MED ORDER — TOPIRAMATE ER 50 MG PO CAP24
50.0000 mg | ORAL_CAPSULE | Freq: Every day | ORAL | 0 refills | Status: DC
Start: 2023-05-18 — End: 2023-06-15

## 2023-05-18 MED ORDER — VITAMIN D (ERGOCALCIFEROL) 1.25 MG (50000 UNIT) PO CAPS
50000.0000 [IU] | ORAL_CAPSULE | ORAL | 0 refills | Status: DC
Start: 2023-05-18 — End: 2023-06-15

## 2023-05-18 NOTE — Assessment & Plan Note (Signed)
Lab Results  Component Value Date   HGBA1C 6.3 (H) 04/20/2023   She has seen a rise in A1c despite making dietary changes and has lost weight. She has been physically active. She has been intolerant to metformin.  Begin Rybelsus 3 mg once daily.

## 2023-05-18 NOTE — Assessment & Plan Note (Signed)
Seeing OB, now on medication to slow down heavy bleeding Started ferrous Gluconate 324 mg once daily.  Tolerating well. C/o fatigue. Reviewed labs from last visit.  Repeat CBC and iron levels in 3 mos

## 2023-05-18 NOTE — Assessment & Plan Note (Signed)
Vitamin D level improving.  Reviewed lab from last visit. Energy level fair.  Taking RX vitamin D weekly. Continue and recheck level in 3 mos

## 2023-05-18 NOTE — Progress Notes (Signed)
Office: [phone]  /  Fax: [phone]  WEIGHT SUMMARY AND BIOMETRICS  Starting Date: 03/18/22  Starting Weight: 229lb   Weight Lost Since Last Visit: 3lb   Vitals Temp: 97.6 F (36.4 C) BP: 136/86 Pulse Rate: 61 SpO2: 100 %   Body Composition  Body Fat %: 38.5 % Fat Mass (lbs): 80.4 lbs Muscle Mass (lbs): 122 lbs Total Body Water (lbs): 80 lbs Visceral Fat Rating : 9    HPI  Chief Complaint: OBESITY  Sue Jackson is here to discuss her progress with her obesity treatment plan. She is on the keeping a food journal and adhering to recommended goals of 1800 calories and 90 protein and states she is following her eating plan approximately 90 % of the time. She states she is exercising 0 minutes 0 times per week.  Interval History:  Since last office visit she is down 3 lb She is down 21 lb in the past year of medically supervised weight management She is down 1.8 lb of muscle mass and is down 1 lb of body fat in the past month She has cut back on sweets  She has talked to her spouse about junk food triggers She is able to exercise control over having a candy bar in the fridge She has good food impulse control on Trokendi XR 50 mg daily She has been raking more and using her punching bag less often She is doing well with dietary logging  Pharmacotherapy: Trokendi XR 50 mg daily  PHYSICAL EXAM:  Blood pressure 136/86, pulse 61, temperature 97.6 F (36.4 C), height 5\' 7"  (1.702 m), weight 208 lb (94.3 kg), SpO2 100%. Body mass index is 32.58 kg/m.  General: She is overweight, cooperative, alert, well developed, and in no acute distress. PSYCH: Has normal mood, affect and thought process.   Lungs: Normal breathing effort, no conversational dyspnea.   ASSESSMENT AND PLAN  TREATMENT PLAN FOR OBESITY:  Recommended Dietary Goals  Sue Jackson is currently in the action stage of change. As such, her goal is to continue weight management plan. She has agreed to  keeping a food journal and adhering to recommended goals of 1800 calories and 90 g of  protein.  Behavioral Intervention  We discussed the following Behavioral Modification Strategies today: increasing lean protein intake to established goals, decreasing simple carbohydrates , increasing vegetables, increasing lower glycemic fruits, increasing fiber rich foods, avoiding skipping meals, increasing water intake , reading food labels , keeping healthy foods at home, planning for success, and continue to work on maintaining a reduced calorie state, getting the recommended amount of protein, incorporating whole foods, making healthy choices, staying well hydrated and practicing mindfulness when eating..  Additional resources provided today: NA  Recommended Physical Activity Goals  [name] has been advised to work up to 150 minutes of moderate intensity aerobic activity a week and strengthening exercises 2-3 times per week for cardiovascular health, weight loss maintenance and preservation of muscle mass.   She has agreed to [employer] strengthening exercises with a goal of 2-3 sessions a week  and Increase the intensity, frequency or duration of aerobic exercises    Pharmacotherapy changes for the treatment of obesity: begin Rybelsus 3 mg daily  ASSOCIATED CONDITIONS ADDRESSED TODAY  Iron deficiency anemia due to chronic blood loss Assessment & Plan: Seeing OB, now on medication to slow down heavy bleeding Started ferrous Gluconate 324 mg once daily.  Tolerating well. C/o fatigue. Reviewed labs from last visit.  Repeat CBC and iron levels in  3 mos   Polyphagia Assessment & Plan: Improving with increased protein intake and use of Trokendi XR 50 mg once daily.  Orders: -     Topiramate ER; Take 1 capsule (50 mg total) by mouth daily with supper.  Dispense: 30 capsule; Refill: 0  Vitamin D deficiency Assessment & Plan: Vitamin D level improving.  Reviewed lab from last visit. Energy level  fair.  Taking RX vitamin D weekly. Continue and recheck level in 3 mos  Orders: -     Vitamin D (Ergocalciferol); Take 1 capsule (50,000 Units total) by mouth every 7 (seven) days.  Dispense: 5 capsule; Refill: 0  Class 1 obesity due to excess calories with serious comorbidity and body mass index (BMI) of 32.0 to 32.9 in adult  Prediabetes Assessment & Plan: Lab Results  Component Value Date   HGBA1C 6.3 (H) 04/20/2023   She has seen a rise in A1c despite making dietary changes and has lost weight. She has been physically active. She has been intolerant to metformin.  Begin Rybelsus 3 mg once daily.  Orders: -     Rybelsus; 1 tab po 30 min before breakfast daily  Dispense: 30 tablet; Refill: 0      She was informed of the importance of frequent follow up visits to maximize her success with intensive lifestyle modifications for her multiple health conditions.   ATTESTASTION STATEMENTS:  Reviewed by clinician on day of visit: allergies, medications, problem list, medical history, surgical history, family history, social history, and previous encounter notes pertinent to obesity diagnosis.   I have personally spent 30 minutes total time today in preparation, patient care, nutritional counseling and documentation for this visit, including the following: review of clinical lab tests; review of medical tests/procedures/services.      Glennis Brink, DO DABFM, DABOM [facility] [address] [place], [place] [address] [phone]

## 2023-05-18 NOTE — Assessment & Plan Note (Signed)
Improving with increased protein intake and use of Trokendi XR 50 mg once daily.

## 2023-05-19 ENCOUNTER — Telehealth: Payer: Self-pay | Admitting: *Deleted

## 2023-05-19 NOTE — Telephone Encounter (Signed)
Prior authorization done via cover my meds for patients Rybelsus. Waiting on determination.  

## 2023-06-15 ENCOUNTER — Telehealth: Payer: 59 | Admitting: Family Medicine

## 2023-06-15 DIAGNOSIS — R7303 Prediabetes: Secondary | ICD-10-CM

## 2023-06-15 DIAGNOSIS — Z6832 Body mass index (BMI) 32.0-32.9, adult: Secondary | ICD-10-CM

## 2023-06-15 DIAGNOSIS — R632 Polyphagia: Secondary | ICD-10-CM

## 2023-06-15 DIAGNOSIS — E66811 Obesity, class 1: Secondary | ICD-10-CM | POA: Diagnosis not present

## 2023-06-15 DIAGNOSIS — E559 Vitamin D deficiency, unspecified: Secondary | ICD-10-CM | POA: Diagnosis not present

## 2023-06-15 MED ORDER — TOPIRAMATE ER 50 MG PO CAP24
50.0000 mg | ORAL_CAPSULE | Freq: Every day | ORAL | 0 refills | Status: DC
Start: 2023-06-15 — End: 2023-07-14

## 2023-06-15 MED ORDER — VITAMIN D (ERGOCALCIFEROL) 1.25 MG (50000 UNIT) PO CAPS
50000.0000 [IU] | ORAL_CAPSULE | ORAL | 0 refills | Status: DC
Start: 2023-06-15 — End: 2023-09-14

## 2023-06-15 NOTE — Assessment & Plan Note (Signed)
Improving.

## 2023-06-15 NOTE — Assessment & Plan Note (Signed)
Lab Results  Component Value Date   HGBA1C 6.3 (H) 04/20/2023   Has not yet started Rybelsus 3 mg daily Working on [employer], weight reduction, regular exercise She has lost 9.1% TBW in the past 13 mos of medically supervised weight management  Wait to start Rybelsus until feeling better Continue to work on higher fiber carbs before work with veggies/ fruit/ lean protein source Avoid high sugar foods and drinks Add in weight training 2 x a week once feeling better

## 2023-06-15 NOTE — Progress Notes (Signed)
Office: [phone]  /  Fax: [phone]  WEIGHT SUMMARY AND BIOMETRICS  No data recorded No data recorded  No data recorded  No data recorded No data recorded  I connected with  Sue Jackson on 06/15/23 by a video enabled telemedicine application and verified that I am speaking with the correct person using two identifiers.   I discussed the limitations of evaluation and management by telemedicine. The patient expressed understanding and agreed to proceed.  Pt at home alone.  Physician in office at [facility]  HPI  Chief Complaint: OBESITY  Sue Jackson is here to discuss her progress with her obesity treatment plan. She is on the keeping a food journal and adhering to recommended goals of 1800 calories and 110 g of  protein and states she is following her eating plan approximately 70 % of the time. She states she is exercising 0 minutes 0 times per week.  She is doing yard work 1-2 x a week and walks > 10,000 steps while at work   Interval History:  Since last office visit she did not weigh (seen via video visit today) She is feeling poorly today Anxiety/ stress high with moving Wife is supportive Getting in a lot of walking at work and had to add a carb prior to work due to feeling lightheaded Picking old fashioned oats, fruit, potato or rice Has not yet started Rybelsus 3 mg RX Feeling improved food impulse control on Trokendi Xr 50 mg daily Feeling inches lost in clothes Limiting sweets  Pharmacotherapy: Trokendi XR 50 mg daily  PHYSICAL EXAM:  There were no vitals taken for this visit. There is no height or weight on file to calculate BMI.  General: She is overweight, cooperative, alert, well developed, and in no acute distress. PSYCH: Has normal mood, affect and thought process.     ASSESSMENT AND PLAN  TREATMENT PLAN FOR OBESITY:  Recommended Dietary Goals  Sue Jackson is currently in the action stage of change. As such, her goal is to continue weight  management plan. She has agreed to keeping a food journal and adhering to recommended goals of 1800 calories and 110 g of  protein.  Behavioral Intervention  We discussed the following Behavioral Modification Strategies today: increasing lean protein intake to established goals, increasing fiber rich foods, increasing water intake , work on meal planning and preparation, work on Psychologist, counselling, work on managing stress, creating time for self-care and relaxation, avoiding temptations and identifying enticing environmental cues, planning for success, and continue to work on maintaining a reduced calorie state, getting the recommended amount of protein, incorporating whole foods, making healthy choices, staying well hydrated and practicing mindfulness when eating..  Additional resources provided today: NA  Recommended Physical Activity Goals  Sue Jackson has been advised to work up to 150 minutes of moderate intensity aerobic activity a week and strengthening exercises 2-3 times per week for cardiovascular health, weight loss maintenance and preservation of muscle mass.   She has agreed to [employer] strengthening exercises with a goal of 2-3 sessions a week  - keep tracking steps  Pharmacotherapy changes for the treatment of obesity: she has RX for Rybelsus 3 mg daily to start once she is feeling better  ASSOCIATED CONDITIONS ADDRESSED TODAY  Polyphagia Assessment & Plan: Improving   Orders: -     Topiramate ER; Take 1 capsule (50 mg total) by mouth daily with supper.  Dispense: 30 capsule; Refill: 0  Class 1 obesity due  to excess calories with serious comorbidity and body mass index (BMI) of 32.0 to 32.9 in adult  Prediabetes Assessment & Plan: Lab Results  Component Value Date   HGBA1C 6.3 (H) 04/20/2023   Has not yet started Rybelsus 3 mg daily Working on [employer], weight reduction, regular exercise She has lost 9.1% TBW in the past 13  mos of medically supervised weight management  Wait to start Rybelsus until feeling better Continue to work on higher fiber carbs before work with veggies/ fruit/ lean protein source Avoid high sugar foods and drinks Add in weight training 2 x a week once feeling better    Vitamin D deficiency Assessment & Plan: Last vitamin D Lab Results  Component Value Date   VD25OH 44.4 04/20/2023   Doing well on RX vitamin D weekly  Orders: -     Vitamin D (Ergocalciferol); Take 1 capsule (50,000 Units total) by mouth every 7 (seven) days.  Dispense: 5 capsule; Refill: 0      She was informed of the importance of frequent follow up visits to maximize her success with intensive lifestyle modifications for her multiple health conditions.   ATTESTASTION STATEMENTS:  Reviewed by clinician on day of visit: allergies, medications, problem list, medical history, surgical history, family history, social history, and previous encounter notes pertinent to obesity diagnosis.   I have personally spent 30 minutes total time today in preparation, patient care, nutritional counseling and documentation for this visit, including the following: review of clinical lab tests; review of medical tests/procedures/services.      Glennis Brink, DO DABFM, DABOM [facility] [address] [place], [place] [address] [phone]

## 2023-06-15 NOTE — Assessment & Plan Note (Signed)
Last vitamin D Lab Results  Component Value Date   VD25OH 44.4 04/20/2023   Doing well on RX vitamin D weekly

## 2023-07-01 NOTE — Progress Notes (Deleted)
NEUROLOGY FOLLOW UP OFFICE NOTE  Sue Jackson [id number]  Assessment/Plan:   Vestibular migraine - worse over the summer, likely related to the heat and weather and aggravated by anxiety.   Migraine prevention:  *** Migraine rescue:  Sumatriptan 50mg  and meclizine if needed *** Limit use of meclizine to no more than 2 days out of week Keep headache diary Follow up ***     Subjective:  Sue Jackson is a 42 year old female with HTN, DCIS, Bipolar disorder, anxiety who follows up for vestibular migraine.  UPDATE: *** Intensity:  moderate  Duration:  a day. Frequency:   Current NSAIDS/analgesics:  none Current triptans:  sumatriptan 50mg  Current ergotamine:  none Current anti-emetic:  none Current muscle relaxants:  none Current Antihypertensive medications:  amlodipine-benazepril Current Antidepressant medications:  none Current Anticonvulsant medications:  Trokendi XR 50mg  daily (higher doses cause side effects), gabapentin 400mg  TID (for anxiety) Current anti-CGRP:  Emgality Current Vitamins/Herbal/Supplements: none Current Antihistamines/Decongestants:  meclizine, Zyrtec, Flonase Other therapy:  none Hormone/birth control:  none Other medications:  none  Caffeine:  decaf coffee Alcohol:  rarely Smoker:  sometimes marijuana.  No cigarettes Diet:  No soda.  Tries to drink water Exercise:  trying to increase Depression:  stable; Anxiety:  yes Other pain:  back pain/arthritis Sleep hygiene:  okay - sometimes anxiety may case insomnia. Works for [employer] service  HISTORY:  For many years she has been experiencing dizziness.  She describes an undulating sensation associated with photophobia, phonophobia, and sometimes nausea and stuttering speech but no visual disturbance, vomiting, numbness or weakness.  Rarely may have an occipital pressure headache.  Usually lasts a day.  Progressively have gotten worse over the past few years, now occurring daily.   Triggers include anxiety, chocolate, nuts, citrus fruits, spicy foods, dairy, skipped meals, sleep deprivation, and heat.  Resting in cool dark and quiet room helps.  She saw ENT who told her that she has vestibular migraines.  Takes meclizine which helps.  Used to take frequently but now no more than once a week.   MRI of brain without contrast on 06/22/2020 was normal.    Past NSAIDS/analgesics:  meloxicam Past abortive triptans:  none Past abortive ergotamine:  none Past muscle relaxants:  Flexeril Past anti-emetic:  none Past antihypertensive medications:  propranolol Past antidepressant medications:  sertraline, citalopram, fluoxetine Past anticonvulsant medications:  topiramate (side effects) Past anti-CGRP:  none.  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 8 g 0   amLODipine-benazepril (LOTREL) 5-10 MG capsule TAKE 1 CAPSULE BY MOUTH EVERY DAY 90 capsule 1  cetirizine (ZYRTEC ALLERGY) 10 MG tablet Take 1 tablet (10 mg total) by mouth daily. 90 tablet 1   cyclobenzaprine (FLEXERIL) 5 MG tablet Take 1 tablet (5 mg total) by mouth 3 (three) times daily as needed for muscle  spasms. 21 tablet 0   ferrous gluconate (FERGON) 324 MG tablet Take 1 tablet (324 mg total) by mouth daily. 30 tablet 1   fluticasone (FLONASE) 50 MCG/ACT nasal spray Place 2 sprays into both nostrils daily. 16 g 0   gabapentin (NEURONTIN) 100 MG capsule Take 1 capsule (100 mg total) by mouth 3 (three) times daily. 90 capsule 3   gabapentin (NEURONTIN) 400 MG capsule Take 1 capsule (400 mg total) by mouth 3 (three) times daily. 90 capsule 3   Galcanezumab-gnlm (EMGALITY) 120 MG/ML SOAJ INJECT 120 MG INTO THE SKIN EVERY 28 DAYS. 1 mL 1   hydrOXYzine (ATARAX) 10 MG tablet TAKE 1 TABLET BY MOUTH THREE TIMES A DAY AS NEEDED 270 tablet 2   ibuprofen (ADVIL) 600 MG tablet Take 1 tablet (600 mg total) by mouth every 8 (eight) hours as needed. 30 tablet 0   ipratropium (ATROVENT) 0.03 % nasal spray Place 2 sprays into both nostrils every 12 (twelve) hours. 30 mL 0   loperamide (IMODIUM A-D) 2 MG tablet Take 1 tablet (2 mg total) by mouth 4 (four) times daily as needed for diarrhea or loose stools. 30 tablet 0   meclizine (ANTIVERT) 25 MG tablet Take 1 tablet (25 mg total) by mouth 3 (three) times daily as needed for dizziness. 30 tablet 0   predniSONE (DELTASONE) 20 MG tablet Take 2 tablets (40 mg total) by mouth daily with breakfast. 10 tablet 0   promethazine-dextromethorphan (PROMETHAZINE-DM) 6.25-15 MG/5ML syrup Take 5 mLs by mouth 4 (four) times daily as needed. 118 mL 0   Semaglutide (RYBELSUS) 3 MG TABS 1 tab po 30 min before breakfast daily 30 tablet 0   SUMAtriptan (IMITREX) 50 MG tablet Take 1 tablet (50 mg total) by mouth as needed for migraine. May repeat in 2 hours if headache persists or recurs.  Maximum 2 tablets in 24 hours. 10 tablet 5   Topiramate ER (TROKENDI XR) 50 MG CP24 Take 1 capsule (50 mg total) by mouth daily with supper. 30 capsule 0   Vitamin D, Ergocalciferol, (DRISDOL) 1.25 MG (50000 UNIT) CAPS capsule Take 1 capsule (50,000 Units total) by mouth every 7 (seven) days. 5 capsule  0   No current facility-administered medications on file prior to visit.    ALLERGIES: No Known Allergies  FAMILY HISTORY: Family History  Problem Relation Age of Onset   Arthritis Mother    Asthma Mother    Cirrhosis Father        d. 53   Depression Maternal Aunt    Hypertension Maternal Aunt    Breast cancer Maternal Aunt 24   Breast cancer Maternal Aunt 59   Breast cancer Paternal Aunt    Dementia Maternal Grandmother    Diabetes Maternal Grandfather    Heart disease Maternal Grandfather    Hypertension Maternal Grandfather    Stroke Maternal Grandfather    Breast cancer Cousin        pat first cousin      Objective:  *** General: No acute distress.  Patient appears well-groomed.   Head:  Normocephalic/atraumatic Eyes:  Fundi examined but not visualized Neck: supple, no paraspinal tenderness, full range of motion Heart:  Regular rate and rhythm Lungs:  Clear to auscultation bilaterally Back:  No paraspinal tenderness Neurological Exam: alert and oriented.  Speech fluent and not dysarthric, language intact.  CN II-XII intact. Bulk and tone normal, muscle strength 5/5 throughout.  Sensation to light touch intact.  Deep tendon reflexes 2+ throughout, toes downgoing.  Finger to nose testing intact.  Gait normal, Romberg negative.   Shon Millet, DO  CC: Betty [place], MD

## 2023-07-03 ENCOUNTER — Other Ambulatory Visit: Payer: Self-pay | Admitting: Family Medicine

## 2023-07-03 DIAGNOSIS — D500 Iron deficiency anemia secondary to blood loss (chronic): Secondary | ICD-10-CM

## 2023-07-04 ENCOUNTER — Ambulatory Visit: Payer: 59 | Admitting: Neurology

## 2023-07-04 ENCOUNTER — Encounter: Payer: Self-pay | Admitting: Neurology

## 2023-07-04 DIAGNOSIS — Z029 Encounter for administrative examinations, unspecified: Secondary | ICD-10-CM

## 2023-07-05 ENCOUNTER — Encounter: Payer: Self-pay | Admitting: Neurology

## 2023-07-05 ENCOUNTER — Other Ambulatory Visit: Payer: Self-pay | Admitting: Neurology

## 2023-07-06 ENCOUNTER — Ambulatory Visit: Payer: 59 | Admitting: Family Medicine

## 2023-07-14 ENCOUNTER — Encounter: Payer: Self-pay | Admitting: Family Medicine

## 2023-07-14 ENCOUNTER — Ambulatory Visit: Payer: 59 | Admitting: Family Medicine

## 2023-07-14 VITALS — BP 138/84 | HR 93 | Temp 97.8°F | Ht 67.0 in | Wt 216.0 lb

## 2023-07-14 DIAGNOSIS — R7303 Prediabetes: Secondary | ICD-10-CM

## 2023-07-14 DIAGNOSIS — E559 Vitamin D deficiency, unspecified: Secondary | ICD-10-CM | POA: Diagnosis not present

## 2023-07-14 DIAGNOSIS — R632 Polyphagia: Secondary | ICD-10-CM

## 2023-07-14 DIAGNOSIS — D500 Iron deficiency anemia secondary to blood loss (chronic): Secondary | ICD-10-CM | POA: Diagnosis not present

## 2023-07-14 DIAGNOSIS — Z6833 Body mass index (BMI) 33.0-33.9, adult: Secondary | ICD-10-CM

## 2023-07-14 DIAGNOSIS — E66811 Obesity, class 1: Secondary | ICD-10-CM

## 2023-07-14 MED ORDER — FERROUS GLUCONATE 324 (38 FE) MG PO TABS
324.0000 mg | ORAL_TABLET | Freq: Every day | ORAL | 1 refills | Status: AC
Start: 2023-07-14 — End: ?

## 2023-07-14 MED ORDER — TOPIRAMATE ER 50 MG PO CAP24
50.0000 mg | ORAL_CAPSULE | Freq: Every day | ORAL | 0 refills | Status: DC
Start: 2023-07-14 — End: 2023-08-17

## 2023-07-14 NOTE — Assessment & Plan Note (Signed)
Last vitamin D Lab Results  Component Value Date   VD25OH 44.4 04/20/2023   She remains on RX vitamin D weekly though c/o low energy levels  Repeat vitamin D level next visit

## 2023-07-14 NOTE — Progress Notes (Signed)
Office: [phone]  /  Fax: [phone]  WEIGHT SUMMARY AND BIOMETRICS  Starting Date: 03/18/22  Starting Weight: 229lb   Weight Lost Since Last Visit: 0lb   Vitals Temp: 97.8 F (36.6 C) BP: 138/84 Pulse Rate: 93 SpO2: 98 %   Body Composition  Body Fat %: 39.9 % Fat Mass (lbs): 86.4 lbs Muscle Mass (lbs): 123.4 lbs Total Body Water (lbs): 85.2 lbs Visceral Fat Rating : 9   HPI  Chief Complaint: OBESITY  Sue Jackson is here to discuss her progress with her obesity treatment plan. She is on the keeping a food journal and adhering to recommended goals of 1800 calories and 110 protein and states she is following her eating plan approximately 90 % of the time. 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    Interval History:  Since last office visit she is up 8 lb She admits to getting off track over the holidays with her food choices She did start Rybelsus 3 mg daily but c/o some dizziness She is taking it in the afternoon and denies meal skipping She has a previous hx of metformin intolerance She is doing well with fruit and veggie intake She has been busy doing yardwork since moving She celebrated her anniversary and Thanksgiving in between visitis Has some sugar cravings at night She gained 1.4 lb of muscle mass and gained 6 lb of body fat She has a net weight loss of 13 lb in the past 15 mos Her wife is also actively working on weight loss  Pharmacotherapy: Rybelsus 3 mg daily + Trokendi XR 50 mg once daily  PHYSICAL EXAM:  Blood pressure 138/84, pulse 93, temperature 97.8 F (36.6 C), height 5\' 7"  (1.702 m), weight 216 lb (98 kg), SpO2 98%. Body mass index is 33.83 kg/m.  General: She is overweight, cooperative, alert, well developed, and in no acute distress. PSYCH: Has normal mood, affect and thought process.   Lungs: Normal breathing effort, no conversational dyspnea.   ASSESSMENT AND PLAN  TREATMENT PLAN FOR OBESITY:  Recommended  Dietary Goals  [name] is currently in the action stage of change. As such, her goal is to continue weight management plan. She has agreed to keeping a food journal and adhering to recommended goals of 1800 calories and 110 g of  protein.  Behavioral Intervention  We discussed the following Behavioral Modification Strategies today: increasing lean protein intake to established goals, increasing vegetables, increasing fiber rich foods, increasing water intake , work on meal planning and preparation, work on Counselling psychologist calories using tracking application, keeping healthy foods at home, work on managing stress, creating time for self-care and relaxation, avoiding temptations and identifying enticing environmental cues, planning for success, and continue to work on maintaining a reduced calorie state, getting the recommended amount of protein, incorporating whole foods, making healthy choices, staying well hydrated and practicing mindfulness when eating..  Additional resources provided today: NA  Recommended Physical Activity Goals  Caris has been advised to work up to 150 minutes of moderate intensity aerobic activity a week and strengthening exercises 2-3 times per week for cardiovascular health, weight loss maintenance and preservation of muscle mass.   She has agreed to Start aerobic activity with a goal of 150 minutes a week at moderate intensity.  - plan to add resistance training at the gym 2 days/ wk  Pharmacotherapy changes for the treatment of obesity: move Rybelsus 3 mg tab to 30 min before breakfast daily  ASSOCIATED  CONDITIONS ADDRESSED TODAY  Prediabetes Assessment & Plan: Lab Results  Component Value Date   HGBA1C 6.3 (H) 04/20/2023   Hx of metformin intolerance Inconsistently taking Rybelsus Working on dietary changes, physical activity and weight loss  Repeat A1c next visit Move Rybelsus to 3 mg 30 min before breakfast daily Reduce intake of  sweets   Polyphagia Assessment & Plan: Improving on Trokendi XR 50 mg daily without adverse SE  Continue to work on eating on a schedule, lean protein and fiber with meals + 100 oz of water intake daily  Orders: -     Topiramate ER; Take 1 capsule (50 mg total) by mouth daily with supper.  Dispense: 30 capsule; Refill: 0  Iron deficiency anemia due to chronic blood loss Assessment & Plan: Lab Results  Component Value Date   IRON 45 03/18/2022   TIBC 396 03/18/2022   FERRITIN 8 (L) 04/20/2023   She is doing well on ferrous gluconate daily without adverse SE Trying to remember to take it daily Has some c/o dizziness which may be due to IDA F/u with OB for DUB  Repeat iron labs next visit  Orders: -     Ferrous Gluconate; Take 1 tablet (324 mg total) by mouth daily.  Dispense: 30 tablet; Refill: 1  Class 1 obesity due to excess calories with body mass index (BMI) of 33.0 to 33.9 in adult, unspecified whether serious comorbidity present  Vitamin D deficiency Assessment & Plan: Last vitamin D Lab Results  Component Value Date   VD25OH 44.4 04/20/2023   She remains on RX vitamin D weekly though c/o low energy levels  Repeat vitamin D level next visit       She was informed of the importance of frequent follow up visits to maximize her success with intensive lifestyle modifications for her multiple health conditions.   ATTESTASTION STATEMENTS:  Reviewed by clinician on day of visit: allergies, medications, problem list, medical history, surgical history, family history, social history, and previous encounter notes pertinent to obesity diagnosis.   I have personally spent 30 minutes total time today in preparation, patient care, nutritional counseling and documentation for this visit, including the following: review of clinical lab tests; review of medical tests/procedures/services.      Glennis Brink, DO DABFM, DABOM [facility] [address] [place], [place] [address] [phone]

## 2023-07-14 NOTE — Assessment & Plan Note (Signed)
Improving on Trokendi XR 50 mg daily without adverse SE  Continue to work on eating on a schedule, lean protein and fiber with meals + 100 oz of water intake daily

## 2023-07-14 NOTE — Assessment & Plan Note (Signed)
Lab Results  Component Value Date   HGBA1C 6.3 (H) 04/20/2023   Hx of metformin intolerance Inconsistently taking Rybelsus Working on dietary changes, physical activity and weight loss  Repeat A1c next visit Move Rybelsus to 3 mg 30 min before breakfast daily Reduce intake of sweets

## 2023-07-14 NOTE — Assessment & Plan Note (Signed)
Lab Results  Component Value Date   IRON 45 03/18/2022   TIBC 396 03/18/2022   FERRITIN 8 (L) 04/20/2023   She is doing well on ferrous gluconate daily without adverse SE Trying to remember to take it daily Has some c/o dizziness which may be due to IDA F/u with OB for DUB  Repeat iron labs next visit

## 2023-07-14 NOTE — Patient Instructions (Addendum)
Move Rybelsus to 30 min before breakfast daily Keep me posted on the dizziness  Resume logging and aim for 1800 cal/ day  This should include 110+ g of protein daily  Continue Trokendi XR 50 mg daily  Aim for a gym goal 2 x a week Track daily steps with a goal of 10,000 per day  Continue to work on eating on a schedule, hydration and lean protein, up to 2 fruit servings per day and plenty of non starchy veggies  Return for f/u in 1 month + non fasting labs

## 2023-07-22 ENCOUNTER — Other Ambulatory Visit: Payer: Self-pay | Admitting: Family Medicine

## 2023-07-22 ENCOUNTER — Encounter: Payer: Self-pay | Admitting: Family Medicine

## 2023-07-22 DIAGNOSIS — I10 Essential (primary) hypertension: Secondary | ICD-10-CM

## 2023-07-26 ENCOUNTER — Encounter: Payer: Self-pay | Admitting: Neurology

## 2023-08-12 ENCOUNTER — Telehealth: Payer: Self-pay | Admitting: Family Medicine

## 2023-08-12 DIAGNOSIS — R42 Dizziness and giddiness: Secondary | ICD-10-CM

## 2023-08-12 NOTE — Progress Notes (Signed)
 Virtual Visit Consent   Sue Jackson, you are scheduled for a virtual visit with a [facility] provider today. Just as with appointments in the office, your consent must be obtained to participate. Your consent will be active for this visit and any virtual visit you may have with one of our providers in the next 365 days. If you have a MyChart account, a copy of this consent can be sent to you electronically.  As this is a virtual visit, video technology does not allow for your provider to perform a traditional examination. This may limit your provider's ability to fully assess your condition. If your provider identifies any concerns that need to be evaluated in person or the need to arrange testing (such as labs, EKG, etc.), we will make arrangements to do so. Although advances in technology are sophisticated, we cannot ensure that it will always work on either your end or our end. If the connection with a video visit is poor, the visit may have to be switched to a telephone visit. With either a video or telephone visit, we are not always able to ensure that we have a secure connection.  By engaging in this virtual visit, you consent to the provision of healthcare and authorize for your insurance to be billed (if applicable) for the services provided during this visit. Depending on your insurance coverage, you may receive a charge related to this service.  I need to obtain your verbal consent now. Are you willing to proceed with your visit today? Sue Jackson has provided verbal consent on 08/12/2023 for a virtual visit (video or telephone). Sue Lamp, FNP  Date: 08/12/2023 4:01 PM  Virtual Visit via Video Note   I, Sue Jackson, connected with  Sue Jackson  ([id number], [id number]) on 08/12/23 at  4:00 PM EST by a video-enabled telemedicine application and verified that I am speaking with the correct person using two identifiers.  Location: Patient: Virtual Visit  Location Patient: Home Provider: Virtual Visit Location Provider: Home Office   I discussed the limitations of evaluation and management by telemedicine and the availability of in person appointments. The patient expressed understanding and agreed to proceed.    History of Present Illness: Sue Jackson is a 43 y.o. who identifies as a female who was assigned female at birth, and is being seen today for vertigo. Meclizineis being taken. She says this is the same as her usual flares of vertigo. No fever and no more concerning sx. She requests a work note. SABRA  HPI: HPI  Problems:  Patient Active Problem List   Diagnosis Date Noted   Iron deficiency anemia due to chronic blood loss 05/18/2023   Generalized obesity with starting BMI 35 09/22/2022   Headache, unspecified headache type 08/14/2022   Vestibular migraine 08/13/2022   RUQ abdominal pain 08/12/2022   Polyphagia 07/29/2022   Postoperative state 07/06/2022   Fibroids 05/19/2022   Uterine leiomyoma 04/20/2022   Pre-diabetes 04/20/2022   Vitamin D  deficiency 04/01/2022   Insulin  resistance 04/01/2022   Depression 04/01/2022   Malignant neoplasm of right female breast, unspecified estrogen receptor status, unspecified site of breast (HCC) 08/13/2021   Genetic testing 08/20/2020   Family history of breast cancer    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   Bipolar 2 disorder (HCC) 11/16/2019   Prediabetes 08/15/2018   Anxiety 02/06/2018   Low back pain radiating to right lower extremity 07/06/2017   Upper back pain, chronic 07/06/2017  Hypertension, essential, benign 03/11/2017    Allergies: No Known Allergies Medications:  Current Outpatient Medications:    albuterol  (VENTOLIN  HFA) 108 (90 Base) MCG/ACT inhaler, Inhale 1-2 puffs into the lungs every 6 (six) hours as needed., Disp: 8 g, Rfl: 0   amLODipine -benazepril  (LOTREL) 5-10 MG capsule, TAKE 1 CAPSULE BY MOUTH EVERY DAY, Disp: 90 capsule,  Rfl: 0   cetirizine  (ZYRTEC  ALLERGY) 10 MG tablet, Take 1 tablet (10 mg total) by mouth daily., Disp: 90 tablet, Rfl: 1   cyclobenzaprine  (FLEXERIL ) 5 MG tablet, Take 1 tablet (5 mg total) by mouth 3 (three) times daily as needed for muscle spasms., Disp: 21 tablet, Rfl: 0   ferrous gluconate  (FERGON) 324 MG tablet, Take 1 tablet (324 mg total) by mouth daily., Disp: 30 tablet, Rfl: 1   fluticasone  (FLONASE ) 50 MCG/ACT nasal spray, Place 2 sprays into both nostrils daily., Disp: 16 g, Rfl: 0   gabapentin  (NEURONTIN ) 100 MG capsule, Take 1 capsule (100 mg total) by mouth 3 (three) times daily., Disp: 90 capsule, Rfl: 3   gabapentin  (NEURONTIN ) 400 MG capsule, Take 1 capsule (400 mg total) by mouth 3 (three) times daily., Disp: 90 capsule, Rfl: 3   Galcanezumab -gnlm (EMGALITY ) 120 MG/ML SOAJ, INJECT 120 MG INTO THE SKIN EVERY 28 DAYS., Disp: 1 mL, Rfl: 5   hydrOXYzine  (ATARAX ) 10 MG tablet, TAKE 1 TABLET BY MOUTH THREE TIMES A DAY AS NEEDED, Disp: 270 tablet, Rfl: 2   ibuprofen  (ADVIL ) 600 MG tablet, Take 1 tablet (600 mg total) by mouth every 8 (eight) hours as needed., Disp: 30 tablet, Rfl: 0   ipratropium (ATROVENT ) 0.03 % nasal spray, Place 2 sprays into both nostrils every 12 (twelve) hours., Disp: 30 mL, Rfl: 0   loperamide  (IMODIUM  A-D) 2 MG tablet, Take 1 tablet (2 mg total) by mouth 4 (four) times daily as needed for diarrhea or loose stools., Disp: 30 tablet, Rfl: 0   meclizine  (ANTIVERT ) 25 MG tablet, Take 1 tablet (25 mg total) by mouth 3 (three) times daily as needed for dizziness., Disp: 30 tablet, Rfl: 0   predniSONE  (DELTASONE ) 20 MG tablet, Take 2 tablets (40 mg total) by mouth daily with breakfast., Disp: 10 tablet, Rfl: 0   promethazine -dextromethorphan (PROMETHAZINE -DM) 6.25-15 MG/5ML syrup, Take 5 mLs by mouth 4 (four) times daily as needed., Disp: 118 mL, Rfl: 0   Semaglutide  (RYBELSUS ) 3 MG TABS, 1 tab po 30 min before breakfast daily, Disp: 30 tablet, Rfl: 0   SUMAtriptan   (IMITREX ) 50 MG tablet, Take 1 tablet (50 mg total) by mouth as needed for migraine. May repeat in 2 hours if headache persists or recurs.  Maximum 2 tablets in 24 hours., Disp: 10 tablet, Rfl: 5   Topiramate  ER (TROKENDI  XR) 50 MG CP24, Take 1 capsule (50 mg total) by mouth daily with supper., Disp: 30 capsule, Rfl: 0   Vitamin D , Ergocalciferol , (DRISDOL ) 1.25 MG (50000 UNIT) CAPS capsule, Take 1 capsule (50,000 Units total) by mouth every 7 (seven) days., Disp: 5 capsule, Rfl: 0  Observations/Objective: Patient is well-developed, well-nourished in no acute distress.  Resting comfortably  at home.  Head is normocephalic, atraumatic.  No labored breathing.  Speech is clear and coherent with logical content.  Patient is alert and oriented at baseline.    Assessment and Plan: 1. Vertigo (Primary)  ED if sx persist or worsen. Continue meclizine .   Follow Up Instructions: I discussed the assessment and treatment plan with the patient. The patient was provided an  opportunity to ask questions and all were answered. The patient agreed with the plan and demonstrated an understanding of the instructions.  A copy of instructions were sent to the patient via MyChart unless otherwise noted below.   Patient has requested to receive PHI (AVS, Work Notes, etc) pertaining to this video visit through e-mail as they are currently without active MyChart. They have voiced understand that email is not considered secure and their health information could be viewed by someone other than the patient.   The patient was advised to call back or seek an in-person evaluation if the symptoms worsen or if the condition fails to improve as anticipated.    Kimla Furth, FNP

## 2023-08-12 NOTE — Patient Instructions (Signed)
Vertigo Vertigo is the feeling that you or the things around you are moving or spinning when they're not. It's different than feeling dizzy. It can also cause: Loss of balance. Trouble standing or walking. Nausea and vomiting. This feeling can come and go at any time. It can last from a few seconds to minutes or even hours. It may go away on its own or be treated with medicine. What are the types of vertigo? There are two types of vertigo: Peripheral vertigo happens when parts of your inner ear don't work like they should. This is the more common type. Central vertigo happens when your brain and spinal cord don't work like they should. Your health care provider will do tests to find out what kind of vertigo you have. This will help them decide on the right treatment for you. Follow these instructions at home: Eating and drinking Drink enough fluid to keep your pee (urine) pale yellow. Do not drink alcohol. Activity When you get up in the morning, first sit up on the side of the bed. When you feel okay, stand slowly while holding onto something. Move slowly. Avoid sudden body or head movements. Avoid certain positions, as told by your provider. Use a cane if you have trouble standing or walking. Sit down right away if you feel unsteady. Place items in your home so they're easy for you to reach without bending or leaning over. Return to normal activities when you're told. Ask what things are safe for you to do. General instructions Take your medicines only as told by your provider. Contact a health care provider if: Your medicines don't help or make your vertigo worse. You get new symptoms. You have a fever. You have nausea or vomiting. Your family or friends spot any changes in how you're acting. A part of your body goes numb. You feel tingling and prickling in a part of your body. You get very bad headaches. Get help right away if: You're always dizzy or you faint. You have a  stiff neck. You have trouble moving or speaking. Your hands, arms, or legs feel weak. Your hearing or eyesight changes. These symptoms may be an emergency. Call 911 right away. Do not wait to see if the symptoms will go away. Do not drive yourself to the hospital. This information is not intended to replace advice given to you by your health care provider. Make sure you discuss any questions you have with your health care provider. Document Revised: 10/29/2022 Document Reviewed: 10/29/2022 Elsevier Patient Education  2024 [employer].

## 2023-08-15 ENCOUNTER — Encounter: Payer: Self-pay | Admitting: Family Medicine

## 2023-08-15 ENCOUNTER — Telehealth (INDEPENDENT_AMBULATORY_CARE_PROVIDER_SITE_OTHER): Payer: 59 | Admitting: Family Medicine

## 2023-08-15 VITALS — Ht 67.0 in

## 2023-08-15 DIAGNOSIS — I10 Essential (primary) hypertension: Secondary | ICD-10-CM

## 2023-08-15 DIAGNOSIS — G43809 Other migraine, not intractable, without status migrainosus: Secondary | ICD-10-CM | POA: Diagnosis not present

## 2023-08-15 DIAGNOSIS — C50911 Malignant neoplasm of unspecified site of right female breast: Secondary | ICD-10-CM

## 2023-08-15 DIAGNOSIS — R7303 Prediabetes: Secondary | ICD-10-CM | POA: Diagnosis not present

## 2023-08-15 NOTE — Progress Notes (Signed)
 Virtual Visit via Video Note I connected with Sue Sue Jackson on 08/15/2023 by a video enabled telemedicine application and verified that I am speaking with the correct person using two identifiers. Location patient: home Location provider:work office Persons participating in the virtual visit: patient, scribe,provider  I discussed the limitations of evaluation and management by telemedicine and the availability of in person appointments. The patient expressed understanding and agreed to proceed.  Chief Complaint  Patient presents with   Medical Management of Chronic Issues   HPI: Ms. Sue Sue Jackson. Sue Jackson is a 43 y.o. female with a PMHx significant for HTN, vestibular migraine, prediabetes, Uterine leiomyoma, anxiety, bipolar disorder, depression, right breast cancer, and chronic pain, among others, who is being seen on video today for follow up and to have a document signed.  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 She states that this ears is very hot, she feels dizzy and her BP goes up every time she has worked in this area in the past, resulting in work absences because she doe snot feel well.  Patient states that her BP goes up to ~150/100 whenever she worked in the DBS machine area. She also endorses associated vertigo with nausea and vomiting when working in this area.   HTN: Currently on amlodipine -benazepril  5-10 mg daily.  BP readings at home: She checks her BP occasionally at home and her readings are usually 130s/80s when coming home from work.  Side effects: none Negative for unusual or severe headache, visual changes, exertional chest pain, dyspnea,focal weakness, or edema.  Lab Results  Component Value Date   CREATININE 0.83 04/20/2023   BUN 17 04/20/2023   NA 137 04/20/2023   K 4.2 04/20/2023   CL 104 04/20/2023   CO2 20 04/20/2023   Prediabetes:  She says she was recently started on Rybelsus  by the weight loss  clinic for her prediabetes.  Negative for abdominal pain, nausea,vomiting, polydipsia,polyuria, or polyphagia.  Lab Results  Component Value Date   HGBA1C 6.3 (H) 04/20/2023   Patient also mentions she has not been feeling well today, and would like a note for work. She says her menstrual periods have been irregular, follows with gynecologist. States that today she has been feeling lightheaded and reports this as similar to her episodes of vertigo. Denies new symptoms, she follows with neurologist and has been dx'ed with vestibular migraines.  Right breast DCIS, high grade. S/p lumpectomy 06/12/20 and adjuvant radiation in 09/2020. She has declined prophylactic treatment with Tamoxifen due to concerns for side effects.  ROS: See pertinent positives and negatives per HPI.  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   Cancer (HCC) 2021   Depression    Family history of breast cancer    Headache 2021   Hypertension    Obesity    Personal history of radiation therapy    Polyphagia    Pre-diabetes    Vitamin D  deficiency     Past Surgical History:  Procedure Laterality Date   BREAST BIOPSY Right 04/09/2020   BREAST BIOPSY Left 04/24/2020   x2   BREAST LUMPECTOMY Right 06/12/2020   BREAST LUMPECTOMY WITH RADIOACTIVE SEED LOCALIZATION Right 06/12/2020   Procedure: RIGHT BREAST LUMPECTOMY WITH RADIOACTIVE SEED LOCALIZATION;  Surgeon: Vernetta Berg, MD;  Location: [facility]  OR;  Service: General;  Laterality: Right;  LMA   ROBOTIC ASSISTED LAPAROSCOPIC LYSIS OF ADHESION  07/06/2022   Procedure: XI ROBOTIC ASSISTED LAPAROSCOPIC LYSIS OF ADHESION;  Surgeon: Lavoie, Marie-Lyne, MD;  Location: [facility] [place];  Service: Gynecology;;   ROBOTIC ASSISTED LAPAROSCOPIC OVARIAN CYSTECTOMY Right 07/06/2022   Procedure: XI ROBOTIC ASSISTED LAPAROSCOPIC right OVARIAN CYSTECTOMY with  peritoneal washings;  Surgeon: Lavoie, Marie-Lyne, MD;  Location: [facility];  Service: Gynecology;  Laterality: Right;    Family History  Problem Relation Age of Onset   Arthritis Mother    Asthma Mother    Cirrhosis Father        d. 22   Depression Maternal Aunt    Hypertension Maternal Aunt    Breast cancer Maternal Aunt 96   Breast cancer Maternal Aunt 59   Breast cancer Paternal Aunt    Dementia Maternal Grandmother    Diabetes Maternal Grandfather    Heart disease Maternal Grandfather    Hypertension Maternal Grandfather    Stroke Maternal Grandfather    Breast cancer Cousin        pat first cousin    Social History   Socioeconomic History   Marital status: Married    Spouse name: Not on file   Number of children: 0   Years of education: Not on file   Highest education level: Not on file  Occupational History   Not on file  Tobacco Use   Smoking status: Former    Types: E-cigarettes   Smokeless tobacco: Never   Tobacco comments:    VAPES  Vaping Use   Vaping status: Every Day   Substances: CBD  Substance and Sexual Activity   Alcohol use: Yes    Comment: occasionally   Drug use: Yes    Types: Marijuana   Sexual activity: Yes    Partners: Female  Other Topics Concern   Not on file  Social History Narrative   Right handed   Social Drivers of Health   Financial Resource Strain: Low Risk  (04/16/2020)   Overall Financial Resource Strain (CARDIA)    Difficulty of Paying Living Expenses: Not very hard  Food Insecurity: No Food Insecurity (04/16/2020)   Hunger Vital Sign    Worried About Running Out of Food in the Last Year: Never true    Ran Out of Food in the Last Year: Never true  Transportation Needs: No Transportation Needs (04/16/2020)   PRAPARE - Administrator, Civil Service (Medical): No    Lack of Transportation (Non-Medical): No  Physical Activity: Not on file  Stress: Not on file  Social Connections: Unknown  (12/15/2021)   Received from [facility], [facility]   Social Network    Social Network: Not on file  Intimate Partner Violence: Unknown (11/11/2021)   Received from [facility], [facility]   HITS    Physically Hurt: Not on file    Insult or Talk Down To: Not on file    Threaten Physical Harm: Not on file    Scream or Curse: Not on file     Current Outpatient Medications:    albuterol  (VENTOLIN  HFA) 108 (90 Base) MCG/ACT inhaler, Inhale 1-2 puffs into the lungs every 6 (six) hours as needed., Disp: 8 g, Rfl: 0   amLODipine -benazepril  (LOTREL) 5-10 MG capsule, TAKE 1 CAPSULE BY MOUTH EVERY DAY, Disp: 90 capsule, Rfl: 0   cetirizine  (ZYRTEC  ALLERGY) 10 MG tablet, Take 1 tablet (10 mg total) by  mouth daily., Disp: 90 tablet, Rfl: 1   cyclobenzaprine  (FLEXERIL ) 5 MG tablet, Take 1 tablet (5 mg total) by mouth 3 (three) times daily as needed for muscle spasms., Disp: 21 tablet, Rfl: 0   ferrous gluconate  (FERGON) 324 MG tablet, Take 1 tablet (324 mg total) by mouth daily., Disp: 30 tablet, Rfl: 1   fluticasone  (FLONASE ) 50 MCG/ACT nasal spray, Place 2 sprays into both nostrils daily., Disp: 16 g, Rfl: 0   gabapentin  (NEURONTIN ) 100 MG capsule, Take 1 capsule (100 mg total) by mouth 3 (three) times daily., Disp: 90 capsule, Rfl: 3   gabapentin  (NEURONTIN ) 400 MG capsule, Take 1 capsule (400 mg total) by mouth 3 (three) times daily., Disp: 90 capsule, Rfl: 3   Galcanezumab -gnlm (EMGALITY ) 120 MG/ML SOAJ, INJECT 120 MG INTO THE SKIN EVERY 28 DAYS., Disp: 1 mL, Rfl: 5   hydrOXYzine  (ATARAX ) 10 MG tablet, TAKE 1 TABLET BY MOUTH THREE TIMES A DAY AS NEEDED, Disp: 270 tablet, Rfl: 2   ibuprofen  (ADVIL ) 600 MG tablet, Take 1 tablet (600 mg total) by mouth every 8 (eight) hours as needed., Disp: 30 tablet, Rfl: 0   ipratropium (ATROVENT ) 0.03 % nasal spray, Place 2 sprays into both nostrils every 12 (twelve) hours., Disp: 30 mL, Rfl: 0   loperamide  (IMODIUM  A-D) 2 MG tablet, Take 1 tablet (2 mg  total) by mouth 4 (four) times daily as needed for diarrhea or loose stools., Disp: 30 tablet, Rfl: 0   meclizine  (ANTIVERT ) 25 MG tablet, Take 1 tablet (25 mg total) by mouth 3 (three) times daily as needed for dizziness., Disp: 30 tablet, Rfl: 0   predniSONE  (DELTASONE ) 20 MG tablet, Take 2 tablets (40 mg total) by mouth daily with breakfast., Disp: 10 tablet, Rfl: 0   promethazine -dextromethorphan (PROMETHAZINE -DM) 6.25-15 MG/5ML syrup, Take 5 mLs by mouth 4 (four) times daily as needed., Disp: 118 mL, Rfl: 0   Semaglutide  (RYBELSUS ) 3 MG TABS, 1 tab po 30 min before breakfast daily, Disp: 30 tablet, Rfl: 0   SUMAtriptan  (IMITREX ) 50 MG tablet, Take 1 tablet (50 mg total) by mouth as needed for migraine. May repeat in 2 hours if headache persists or recurs.  Maximum 2 tablets in 24 hours., Disp: 10 tablet, Rfl: 5   Topiramate  ER (TROKENDI  XR) 50 MG CP24, Take 1 capsule (50 mg total) by mouth daily with supper., Disp: 30 capsule, Rfl: 0   Vitamin D , Ergocalciferol , (DRISDOL ) 1.25 MG (50000 UNIT) CAPS capsule, Take 1 capsule (50,000 Units total) by mouth every 7 (seven) days., Disp: 5 capsule, Rfl: 0  EXAM:  VITALS per patient if applicable:Ht 5' 7 (1.702 m)   BMI 33.83 kg/m   GENERAL: alert, oriented, appears well and in no acute distress  HEENT: atraumatic, conjunctiva clear, no obvious abnormalities on inspection of external nose and ears  NECK: normal movements of the head and neck  LUNGS: on inspection no signs of respiratory distress, breathing rate appears normal, no obvious gross SOB, gasping or wheezing  CV: no obvious cyanosis  MS: moves all visible extremities without noticeable abnormality  PSYCH/NEURO: pleasant and cooperative, no obvious depression or anxiety, speech and thought processing grossly intact  ASSESSMENT AND PLAN:  Discussed the following assessment and plan:  Vestibular migraine Assessment & Plan: Following with neurologist. Requesting a note for work  because of mild lightheadedness, note provided as requested. Instructed about warning signs, in which case she understands she needs to seek medical attention.   Pre-diabetes Assessment & Plan: Last  hemoglobin A1c 6.3 on 04/20/2023. She reports that she was recently started on Rybelsus  3 mg daily. Continue a healthy life style consistently for diabetes prevention. Following at [facility].   Hypertension, essential, benign Assessment & Plan: Reporting BP at home as adequately controlled. Continue Amlodipine -Benazepril  5-10 mg daily and well as low salt diet. Recommend to monitor BP at home regularly. Because she is following with healthy weight and wellness clinic, when labs and BP are checked periodically, annual follow-up is appropriate. Letter for work will be provided as requested.   Malignant neoplasm of right female breast, unspecified estrogen receptor status, unspecified site of breast [facility]) Assessment & Plan: She is s/p right lumpectomy 06/2020 and adjuvant radiation therapy 09/2020. She has declined prophylactic treatment due to concerns for side effects. Last mammogram 08/11/22 Bi-Rads 2, planning on breast MRI this year. Following with oncologist.   We discussed possible serious and likely etiologies, options for evaluation and workup, limitations of telemedicine visit vs in person visit, treatment, treatment risks and precautions. The patient was advised to call back or seek an in-person evaluation if the symptoms worsen or if the condition fails to improve as anticipated. I discussed the assessment and treatment plan with the patient. The patient was provided an opportunity to ask questions and all were answered. The patient agreed with the plan and demonstrated an understanding of the instructions.  Return in about 1 year (around 08/14/2024) for CPE.  I, Leonce PARAS Wierda, acting as a scribe for Raysean Graumann, MD., have documented all relevant  documentation on the behalf of Sue Watling, MD, as directed by  Ameenah Prosser, MD while in the presence of Teyton Pattillo, MD.   I, Mckenzie Toruno, MD, have reviewed all documentation for this visit. The documentation on 08/15/23 for the exam, diagnosis, procedures, and orders are all accurate and complete.  Nyssa Sayegh, MD

## 2023-08-15 NOTE — Assessment & Plan Note (Signed)
 Reporting BP at home as adequately controlled. Continue Amlodipine -Benazepril  5-10 mg daily and well as low salt diet. Recommend to monitor BP at home regularly. Because she is following with healthy weight and wellness clinic, when labs and BP are checked periodically, annual follow-up is appropriate.

## 2023-08-15 NOTE — Assessment & Plan Note (Signed)
 Last hemoglobin A1c 6.3 on 04/20/2023. She reports that she was recently started on Rybelsus 3 mg daily. Continue a healthy life style consistently for diabetes prevention. Following at [facility].

## 2023-08-15 NOTE — Assessment & Plan Note (Signed)
 Following with neurologist. Requesting a note for work because of mild lightheadedness, note provided as requested. Instructed about warning signs, in which case she understands she needs to seek medical attention.

## 2023-08-17 ENCOUNTER — Encounter: Payer: Self-pay | Admitting: Family Medicine

## 2023-08-17 ENCOUNTER — Ambulatory Visit: Payer: 59 | Admitting: Family Medicine

## 2023-08-17 VITALS — BP 126/82 | HR 67 | Temp 98.0°F | Ht 67.0 in | Wt 219.0 lb

## 2023-08-17 DIAGNOSIS — F3181 Bipolar II disorder: Secondary | ICD-10-CM

## 2023-08-17 DIAGNOSIS — R632 Polyphagia: Secondary | ICD-10-CM

## 2023-08-17 DIAGNOSIS — R7303 Prediabetes: Secondary | ICD-10-CM | POA: Diagnosis not present

## 2023-08-17 DIAGNOSIS — R631 Polydipsia: Secondary | ICD-10-CM | POA: Diagnosis not present

## 2023-08-17 DIAGNOSIS — E66811 Obesity, class 1: Secondary | ICD-10-CM

## 2023-08-17 DIAGNOSIS — D500 Iron deficiency anemia secondary to blood loss (chronic): Secondary | ICD-10-CM

## 2023-08-17 DIAGNOSIS — Z6834 Body mass index (BMI) 34.0-34.9, adult: Secondary | ICD-10-CM

## 2023-08-17 DIAGNOSIS — E559 Vitamin D deficiency, unspecified: Secondary | ICD-10-CM | POA: Diagnosis not present

## 2023-08-17 DIAGNOSIS — K5901 Slow transit constipation: Secondary | ICD-10-CM

## 2023-08-17 MED ORDER — POLYETHYLENE GLYCOL 3350 17 GM/SCOOP PO POWD: 17.0000 g | Freq: Two times a day (BID) | ORAL | 1 refills | Status: AC | PRN

## 2023-08-17 MED ORDER — RYBELSUS 3 MG PO TABS: ORAL_TABLET | ORAL | 0 refills | Status: DC

## 2023-08-17 MED ORDER — TOPIRAMATE ER 50 MG PO CAP24: 50.0000 mg | ORAL_CAPSULE | Freq: Every day | ORAL | 0 refills | Status: DC

## 2023-08-17 NOTE — Assessment & Plan Note (Signed)
 Lab Results  Component Value Date   HGBA1C 6.3 (H) 04/20/2023   Doing well on Rybelsus  3 mg daily, moved to AM 30 min before breakfast and is tolerating better Hx of metformin  intolerance Has been fairly consistent with weight training 2 days, wk as evidenced by gaining lean body mass  Continue to work on reducing high intake of high sugar foods and drinks Increase home workouts to 3-4 x a week Repeat labs today Consider increasing Rybelsus  dose

## 2023-08-17 NOTE — Assessment & Plan Note (Signed)
 Last vitamin D Lab Results  Component Value Date   VD25OH 44.4 04/20/2023   She is doing well on RX vitamin D weekly Energy level is stable  Repeat lab today

## 2023-08-17 NOTE — Assessment & Plan Note (Signed)
 Long hx of DUB with IDA.  She is on ferrous gluconate 324 mg once daily and is tolerating this well.  Energy level has improved some  Check labs today F/ u with OB gyn for DUB

## 2023-08-17 NOTE — Assessment & Plan Note (Signed)
 Improved on topiramate ER 50 mg in the evening Denies adverse SE

## 2023-08-17 NOTE — Assessment & Plan Note (Signed)
 New problem She c/o feeling very thirsty all day, drinking many bottles of water  with c/o dry mouth Urine is a clearish color She has c/o dizziness and is not using any electrolyte replacement Anxiety levels are high  Obtain electrolytes / renal function/ glucose on BMP today Aim for ~90 oz of water  intake daily, adding one sugar free electrolyte drink daily

## 2023-08-17 NOTE — Progress Notes (Signed)
 Office: [phone]  /  Fax: [phone]  WEIGHT SUMMARY AND BIOMETRICS  Starting Date: 03/18/22  Starting Weight: 229lb   Weight Lost Since Last Visit: 0lb   Vitals Temp: 98 F (36.7 C) BP: 126/82 Pulse Rate: 67 SpO2: 99 %   Body Composition  Body Fat %: 39.5 % Fat Mass (lbs): 86.8 lbs Muscle Mass (lbs): 126.2 lbs Total Body Water  (lbs): 84 lbs Visceral Fat Rating : 9    HPI  Chief Complaint: OBESITY  Sue Jackson is here to discuss her progress with her obesity treatment plan. She is on the keeping a food journal and adhering to recommended goals of 1800 calories and 110 protein and states she is following her eating plan approximately 80 % of the time. She states she is exercising 30-60 minutes 2 times per week.  Interval History:  Since last office visit she is up 3 lb She is up 2.8 lb of muscle mass and up 0.4 lb of body fat in the past month This gives her a net weight loss of 10 lb in the past 1.5 years She did indulge more over the holidays She has craved more sugar since She did move Rybelsus  to 30 min before breakfast daily She is feeling episodes of dizziness with occasional meal skipping AND and increase in sugar intake She has been eating breakfast later Her anxiety has been higher at work and at home but she is sleeping better She has been dealing more with vestibular migraines  Pharmacotherapy: Trokendi  XR 50 mg daily + Rybelsus  3 mg daily  PHYSICAL EXAM:  Blood pressure 126/82, pulse 67, temperature 98 F (36.7 C), height 5' 7 (1.702 m), weight 219 lb (99.3 kg), SpO2 99%. Body mass index is 34.3 kg/m.  General: She is overweight, cooperative, alert, well developed, and in no acute distress. PSYCH: Has normal mood, affect and thought process.   Lungs: Normal breathing effort, no conversational dyspnea.   ASSESSMENT AND PLAN  TREATMENT PLAN FOR OBESITY:  Recommended Dietary Goals  Sue Jackson is currently in the action stage of change. As  such, her goal is to continue weight management plan. She has agreed to keeping a food journal and adhering to recommended goals of 1800 calories and 110 g  protein. - get rid of the high sugar foods and drinks at home - eat 3 meals per day without skipping with lean protein and fiber at meals to prevent wide fluctuations in blood sugar  Behavioral Intervention  We discussed the following Behavioral Modification Strategies today: increasing lean protein intake to established goals, increasing vegetables, increasing fiber rich foods, avoiding skipping meals, increasing water  intake , work on meal planning and preparation, work on tracking and journaling calories using tracking application, keeping healthy foods at home, decreasing eating out or consumption of processed foods, and making healthy choices when eating convenient foods, work on managing stress, creating time for self-care and relaxation, avoiding temptations and identifying enticing environmental cues, planning for success, and continue to work on maintaining a reduced calorie state, getting the recommended amount of protein, incorporating whole foods, making healthy choices, staying well hydrated and practicing mindfulness when eating..  Additional resources provided today: NA  Recommended Physical Activity Goals  Sue Jackson has been advised to work up to 150 minutes of moderate intensity aerobic activity a week and strengthening exercises 2-3 times per week for cardiovascular health, weight loss maintenance and preservation of muscle mass.   She has agreed to Increase the intensity, frequency or duration of strengthening  exercises  - increase home weight training to 3-4 x a week  Pharmacotherapy changes for the treatment of obesity: none  ASSOCIATED CONDITIONS ADDRESSED TODAY  Excessive thirst Assessment & Plan: New problem She c/o feeling very thirsty all day, drinking many bottles of water  with c/o dry mouth Urine is a clearish  color She has c/o dizziness and is not using any electrolyte replacement Anxiety levels are high  Obtain electrolytes / renal function/ glucose on BMP today Aim for ~90 oz of water  intake daily, adding one sugar free electrolyte drink daily  Orders: -     Basic metabolic panel  Prediabetes Assessment & Plan: Lab Results  Component Value Date   HGBA1C 6.3 (H) 04/20/2023   Doing well on Rybelsus  3 mg daily, moved to AM 30 min before breakfast and is tolerating better Hx of metformin  intolerance Has been fairly consistent with weight training 2 days, wk as evidenced by gaining lean body mass  Continue to work on reducing high intake of high sugar foods and drinks Increase home workouts to 3-4 x a week Repeat labs today Consider increasing Rybelsus  dose  Orders: -     Rybelsus ; 1 tab po 30 min before breakfast daily  Dispense: 30 tablet; Refill: 0 -     Hemoglobin A1c  Polyphagia Assessment & Plan: Improved on topiramate  ER 50 mg in the evening Denies adverse SE   Orders: -     Topiramate  ER; Take 1 capsule (50 mg total) by mouth daily with supper.  Dispense: 30 capsule; Refill: 0  Vitamin D  deficiency Assessment & Plan: Last vitamin D  Lab Results  Component Value Date   VD25OH 44.4 04/20/2023   She is doing well on RX vitamin D  weekly Energy level is stable  Repeat lab today  Orders: -     VITAMIN D  25 Hydroxy (Vit-D Deficiency, Fractures)  Bipolar 2 disorder (HCC) Assessment & Plan: Anxiety has worsened lately both by stress at home with spouse and at work from the recent tragedy of an employee that had passed away.  She is managed by Sue Bach NP and is seeing a counselor.  She is on multiple mood medications and reports good compliance.  Sleeping well at night.  Continue current plan of care with psych follow up Work on getting back into the routine of consistent exercise, self care and healthier meals   Class 1 obesity due to excess calories with  serious comorbidity and body mass index (BMI) of 34.0 to 34.9 in adult  Iron deficiency anemia due to chronic blood loss Assessment & Plan: Long hx of DUB with IDA.  She is on ferrous gluconate  324 mg once daily and is tolerating this well.  Energy level has improved some  Check labs today F/ u with OB gyn for DUB  Orders: -     Ferritin -     Iron and TIBC      She was informed of the importance of frequent follow up visits to maximize her success with intensive lifestyle modifications for her multiple health conditions.   ATTESTASTION STATEMENTS:  Reviewed by clinician on day of visit: allergies, medications, problem list, medical history, surgical history, family history, social history, and previous encounter notes pertinent to obesity diagnosis.   I have personally spent 30 minutes total time today in preparation, patient care, nutritional counseling and documentation for this visit, including the following: review of clinical lab tests; review of medical tests/procedures/services.      Darice FORBES Haddock,  DO DABFM, DABOM [facility] [address] [place], [place] [address] [phone]

## 2023-08-17 NOTE — Assessment & Plan Note (Signed)
 Anxiety has worsened lately both by stress at home with spouse and at work from the recent tragedy of an employee that had passed away.  She is managed by Laymon Bach NP and is seeing a counselor.  She is on multiple mood medications and reports good compliance.  Sleeping well at night.  Continue current plan of care with psych follow up Work on getting back into the routine of consistent exercise, self care and healthier meals

## 2023-08-18 LAB — IRON AND TIBC
Iron Saturation: 19 % (ref 15–55)
Iron: 73 ug/dL (ref 27–159)
Total Iron Binding Capacity: 375 ug/dL (ref 250–450)
UIBC: 302 ug/dL (ref 131–425)

## 2023-08-18 LAB — HEMOGLOBIN A1C
Est. average glucose Bld gHb Est-mCnc: 131 mg/dL
Hgb A1c MFr Bld: 6.2 % — ABNORMAL HIGH (ref 4.8–5.6)

## 2023-08-18 LAB — BASIC METABOLIC PANEL
BUN/Creatinine Ratio: 20 (ref 9–23)
BUN: 21 mg/dL (ref 6–24)
CO2: 22 mmol/L (ref 20–29)
Calcium: 9.1 mg/dL (ref 8.7–10.2)
Chloride: 100 mmol/L (ref 96–106)
Creatinine, Ser: 1.06 mg/dL — ABNORMAL HIGH (ref 0.57–1.00)
Glucose: 103 mg/dL — ABNORMAL HIGH (ref 70–99)
Potassium: 4.4 mmol/L (ref 3.5–5.2)
Sodium: 138 mmol/L (ref 134–144)
eGFR: 66 mL/min/{1.73_m2} (ref >59)

## 2023-08-18 LAB — FERRITIN: Ferritin: 25 ng/mL (ref 15–150)

## 2023-08-18 LAB — VITAMIN D 25 HYDROXY (VIT D DEFICIENCY, FRACTURES): Vit D, 25-Hydroxy: 48.2 ng/mL (ref 30.0–100.0)

## 2023-08-18 NOTE — Assessment & Plan Note (Signed)
 She is s/p right lumpectomy 06/2020 and adjuvant radiation therapy 09/2020. She has declined prophylactic treatment due to concerns for side effects. Last mammogram 08/11/22 Bi-Rads 2, planning on breast MRI this year. Following with oncologist.

## 2023-08-31 ENCOUNTER — Encounter: Payer: Self-pay | Admitting: Family Medicine

## 2023-08-31 ENCOUNTER — Telehealth: Payer: 59 | Admitting: Family Medicine

## 2023-08-31 VITALS — Ht 67.0 in

## 2023-08-31 DIAGNOSIS — K760 Fatty (change of) liver, not elsewhere classified: Secondary | ICD-10-CM | POA: Diagnosis not present

## 2023-08-31 DIAGNOSIS — R631 Polydipsia: Secondary | ICD-10-CM | POA: Diagnosis not present

## 2023-08-31 DIAGNOSIS — N938 Other specified abnormal uterine and vaginal bleeding: Secondary | ICD-10-CM | POA: Diagnosis not present

## 2023-08-31 DIAGNOSIS — R1011 Right upper quadrant pain: Secondary | ICD-10-CM

## 2023-08-31 NOTE — Assessment & Plan Note (Signed)
We discussed Dx,prognosis,and treatment options. LFT's has been in normal range. RUQ [place] 08/2022. Continue wt loss through a healthful diet and regular physical activity. She would like GI consultation.

## 2023-08-31 NOTE — Progress Notes (Signed)
Virtual Visit via Video Note I connected with Sue Jackson on 08/31/2023 by a video enabled telemedicine application and verified that I am speaking with the correct person using two identifiers. Location patient: home Location provider:work office Persons participating in the virtual visit: patient, scribe,provider  I discussed the limitations of evaluation and management by telemedicine and the availability of in person appointments. The patient expressed understanding and agreed to proceed.  Chief Complaint  Patient presents with   Lab Work    Follow up on blood work done by weight loss clinic    HPI: [name] is a 43 y.o. female with a PMHx significant for HTN, vestibular migraine, prediabetes, uterine leiomyoma, anxiety, bipolar disorder, depression,and right breast cancer who is being seen on video to follow on recent blood work at the weight loss clinic.  Patient is concerned about her potassium levels, because it has been low years ago and she doe snot think it has been re-checked. She reports some years hx of intermittent nausea, vomiting, and vertigo. She wonders iof these symptoms are caused by low K+. She follows with neurologist for vestibular migraine.   She reports increased thirst for years. She has not identified exacerbating factors. Negative for polyuria and polyphagia. Lab Results  Component Value Date   HGBA1C 6.2 (H) 08/17/2023   Lab Results  Component Value Date   NA 138 08/17/2023   CL 100 08/17/2023   K 4.4 08/17/2023   CO2 22 08/17/2023   BUN 21 08/17/2023   CREATININE 1.06 (H) 08/17/2023   EGFR 66 08/17/2023   CALCIUM 9.1 08/17/2023   ALBUMIN 3.9 04/20/2023   GLUCOSE 103 (H) 08/17/2023   Lab Results  Component Value Date   ALT 18 04/20/2023   AST 12 04/20/2023   ALKPHOS 69 04/20/2023   BILITOT 0.2 04/20/2023   Lab Results  Component Value Date   TSH 2.210 04/20/2023   She also complains of chronic RUQ abdominal  pain.  Concerned about fatty liver. She has been having this pain for several years, but never went to gastroenterology because of travel. She would like a new referral today.  Currently, this pain happens 2-3 times per week. It is often accompanied by bloating, and sometimes worsened by spicy foods.  Mentions she has occasional constipation from taking iron pills, but has had pain before iron was started.  No other bowel symptoms or blood in her stool. Lab Results  Component Value Date   WBC 7.4 04/20/2023   HGB 13.5 04/20/2023   HCT 45.2 04/20/2023   MCV 83 04/20/2023   PLT 310 04/20/2023   Lab Results  Component Value Date   ALT 18 04/20/2023   AST 12 04/20/2023   ALKPHOS 69 04/20/2023   BILITOT 0.2 04/20/2023   RUQ [place] 08/18/2022:  1. Increased heterogeneous echogenicity throughout the liver is nonspecific and may be seen with hepatic steatosis or other forms of underlying intrinsic liver disease. 2. The gallbladder and common bile duct are normal.   She also mentions irregular menses.Problem has been going on for a while, last discussed with her gyn 05/2022. MRI of the Pelvis 05/26/22 showed a Rt Ovarian Cyst c/w Dermoid at 5.3 cm.  A Lt simple Cyst at 4.4 cm and uterine Fibroids  S/P robotic assisted left ovarian cystectomy with peritoneal washing.  ROS: See pertinent positives and negatives per HPI.  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   Cancer (HCC) 2021   Depression    Family history of breast cancer    Headache 2021   Hypertension    Obesity    Personal history of radiation therapy    Polyphagia    Pre-diabetes    Vitamin D deficiency     Past Surgical History:  Procedure Laterality Date   BREAST BIOPSY Right 04/09/2020   BREAST BIOPSY Left 04/24/2020   x2   BREAST LUMPECTOMY Right 06/12/2020   BREAST LUMPECTOMY WITH RADIOACTIVE SEED  LOCALIZATION Right 06/12/2020   Procedure: RIGHT BREAST LUMPECTOMY WITH RADIOACTIVE SEED LOCALIZATION;  Surgeon: Abigail Miyamoto, MD;  Location: [facility] OR;  Service: General;  Laterality: Right;  LMA   ROBOTIC ASSISTED LAPAROSCOPIC LYSIS OF ADHESION  07/06/2022   Procedure: XI ROBOTIC ASSISTED LAPAROSCOPIC LYSIS OF ADHESION;  Surgeon: Genia Del, MD;  Location: [facility];  Service: Gynecology;;   ROBOTIC ASSISTED LAPAROSCOPIC OVARIAN CYSTECTOMY Right 07/06/2022   Procedure: XI ROBOTIC ASSISTED LAPAROSCOPIC right OVARIAN CYSTECTOMY with peritoneal washings;  Surgeon: Genia Del, MD;  Location: [facility];  Service: Gynecology;  Laterality: Right;    Family History  Problem Relation Age of Onset   Arthritis Mother    Asthma Mother    Cirrhosis Father        d. 28   Depression Maternal Aunt    Hypertension Maternal Aunt    Breast cancer Maternal Aunt 54   Breast cancer Maternal Aunt 59   Breast cancer Paternal Aunt    Dementia Maternal Grandmother    Diabetes Maternal Grandfather    Heart disease Maternal Grandfather    Hypertension Maternal Grandfather    Stroke Maternal Grandfather    Breast cancer Cousin        pat first cousin    Social History   Socioeconomic History   Marital status: Married    Spouse name: Not on file   Number of children: 0   Years of education: Not on file   Highest education level: Not on file  Occupational History   Not on file  Tobacco Use   Smoking status: Former    Types: E-cigarettes   Smokeless tobacco: Never   Tobacco comments:    VAPES  Vaping Use   Vaping status: Every Day   Substances: CBD  Substance and Sexual Activity   Alcohol use: Yes    Comment: occasionally   Drug use: Yes    Types: Marijuana   Sexual activity: Yes    Partners: Female  Other Topics Concern   Not on file  Social History Narrative   Right handed   Social Drivers of Health   Financial Resource Strain: Low  Risk  (04/16/2020)   Overall Financial Resource Strain (CARDIA)    Difficulty of Paying Living Expenses: Not very hard  Food Insecurity: No Food Insecurity (04/16/2020)   Hunger Vital Sign    Worried About Running Out of Food in the Last Year: Never true    Ran Out of Food in the Last Year: Never true  Transportation Needs: No Transportation Needs (04/16/2020)   PRAPARE - Administrator, Civil Service (Medical): No    Lack of Transportation (Non-Medical): No  Physical Activity: Not on file  Stress: Not on file  Social Connections: Unknown (12/15/2021)   Received from [facility]   Social Network    Social Network: Not on file  Intimate Partner Violence: Unknown (11/11/2021)   Received from [facility]   HITS    Physically Hurt: Not on file    Insult or Talk Down To: Not on file    Threaten Physical Harm: Not on file    Scream or Curse: Not on file     Current Outpatient Medications:    albuterol (VENTOLIN HFA) 108 (90 Base) MCG/ACT inhaler, Inhale 1-2 puffs into the lungs every 6 (six) hours as needed., Disp: 8 g, Rfl: 0   amLODipine-benazepril (LOTREL) 5-10 MG capsule, TAKE 1 CAPSULE BY MOUTH EVERY DAY, Disp: 90 capsule, Rfl: 0   cetirizine (ZYRTEC ALLERGY) 10 MG tablet, Take 1 tablet (10 mg total) by mouth daily., Disp: 90 tablet, Rfl: 1   cyclobenzaprine (FLEXERIL) 5 MG tablet, Take 1 tablet (5 mg total) by mouth 3 (three) times daily as needed for muscle spasms., Disp: 21 tablet, Rfl: 0   ferrous gluconate (FERGON) 324 MG tablet, Take 1 tablet (324 mg total) by mouth daily., Disp: 30 tablet, Rfl: 1   fluticasone (FLONASE) 50 MCG/ACT nasal spray, Place 2 sprays into both nostrils daily., Disp: 16 g, Rfl: 0   gabapentin (NEURONTIN) 100 MG capsule, Take 1 capsule (100 mg total) by mouth 3 (three) times daily., Disp: 90 capsule, Rfl: 3   gabapentin (NEURONTIN) 400 MG capsule, Take 1 capsule (400 mg total) by mouth 3 (three) times daily., Disp: 90  capsule, Rfl: 3   Galcanezumab-gnlm (EMGALITY) 120 MG/ML SOAJ, INJECT 120 MG INTO THE SKIN EVERY 28 DAYS., Disp: 1 mL, Rfl: 5   hydrOXYzine (ATARAX) 10 MG tablet, TAKE 1 TABLET BY MOUTH THREE TIMES A DAY AS NEEDED, Disp: 270 tablet, Rfl: 2   ibuprofen (ADVIL) 600 MG tablet, Take 1 tablet (600 mg total) by mouth every 8 (eight) hours as needed., Disp: 30 tablet, Rfl: 0   ipratropium (ATROVENT) 0.03 % nasal spray, Place 2 sprays into both nostrils every 12 (twelve) hours., Disp: 30 mL, Rfl: 0   loperamide (IMODIUM A-D) 2 MG tablet, Take 1 tablet (2 mg total) by mouth 4 (four) times daily as needed for diarrhea or loose stools., Disp: 30 tablet, Rfl: 0   meclizine (ANTIVERT) 25 MG tablet, Take 1 tablet (25 mg total) by mouth 3 (three) times daily as needed for dizziness., Disp: 30 tablet, Rfl: 0   polyethylene glycol powder (GLYCOLAX/MIRALAX) 17 GM/SCOOP powder, Take 17 g by mouth 2 (two) times daily as needed., Disp: 3350 g, Rfl: 1   Semaglutide (RYBELSUS) 3 MG TABS, 1 tab po 30 min before breakfast daily, Disp: 30 tablet, Rfl: 0   SUMAtriptan (IMITREX) 50 MG tablet, Take 1 tablet (50 mg total) by mouth as needed for migraine. May repeat in 2 hours if headache persists or recurs.  Maximum 2 tablets in 24 hours., Disp: 10 tablet, Rfl: 5   Topiramate ER (TROKENDI XR) 50 MG CP24, Take 1 capsule (50 mg total) by mouth daily with supper., Disp: 30 capsule, Rfl: 0   Vitamin D, Ergocalciferol, (DRISDOL) 1.25 MG (50000 UNIT) CAPS capsule, Take 1 capsule (50,000 Units total) by mouth every 7 (seven) days., Disp: 5 capsule, Rfl: 0  EXAM:  VITALS per patient if applicable:Ht 5\' 7"  (1.702 m)   BMI 34.30 kg/m   GENERAL: alert, oriented, appears well and in no acute distress  HEENT: atraumatic, conjunctiva clear, no obvious abnormalities on inspection of external nose and ears  NECK: normal movements of the head and neck  LUNGS: on inspection no  signs of respiratory distress, breathing rate appears normal,  no obvious gross SOB, gasping or wheezing  CV: no obvious cyanosis  MS: moves all visible extremities without noticeable abnormality  PSYCH/NEURO: pleasant and cooperative, no obvious depression, + anxious. Speech and thought processing grossly intact  ASSESSMENT AND PLAN:  Discussed the following assessment and plan:  RUQ abdominal pain Assessment & Plan: This is a chronic problem, it seems to be stable. She is very concerned and requesting GI referral. She was referred in 08/2022 for this problem. RUQ US done in 08/2022 otherwise negative except for hepatic steatosis. Avoid identified trigger factors. GI referral placed.  Orders: -     Ambulatory referral to Gastroenterology  Excessive thirst Assessment & Plan: This problem has been going on for several years, I am not sure about what could be causing this problem. He could be aggravated by some of her medications. Hemoglobin A1c done recently, 6.3. Normal electrolytes. Continue monitoring for new symptoms.  DUB (dysfunctional uterine bleeding) This is a chronic problem. Hx of uterine fibroids. Following with gynecologist.  Hepatic steatosis Assessment & Plan: We discussed Dx,prognosis,and treatment options. LFT's has been in normal range. RUQ [place] 08/2022. Continue wt loss through a healthful diet and regular physical activity. She would like GI consultation.  Orders: -     Ambulatory referral to Gastroenterology  This problem could be contributing to her iron deficiency, she has hx of fibroids and currently following with gynecologist.  We discussed possible serious and likely etiologies, options for evaluation and workup, limitations of telemedicine visit vs in person visit, treatment, treatment risks and precautions. The patient was advised to call back or seek an in-person evaluation if the symptoms worsen or if the condition fails to improve as anticipated. I discussed the assessment and treatment plan with the  patient. The patient was provided an opportunity to ask questions and all were answered. The patient agreed with the plan and demonstrated an understanding of the instructions.  Return if symptoms worsen or fail to improve, for keep next appointment.  I, Rolla Etienne Wierda, acting as a scribe for Imojean Yoshino [place], MD., have documented all relevant documentation on the behalf of Sue Angus [place], MD, as directed by  Sue Caples [place], MD while in the presence of Sue Waren [place], MD.   I, Tacy Chavis [place], MD, have reviewed all documentation for this visit. The documentation on 08/31/23 for the exam, diagnosis, procedures, and orders are all accurate and complete.  Sue Balster [place], MD

## 2023-08-31 NOTE — Assessment & Plan Note (Signed)
This problem has been going on for several years, I am not sure about what could be causing this problem. He could be aggravated by some of her medications. Hemoglobin A1c done recently, 6.3. Normal electrolytes. Continue monitoring for new symptoms.

## 2023-08-31 NOTE — Assessment & Plan Note (Signed)
This is a chronic problem, it seems to be stable. She is very concerned and requesting GI referral. She was referred in 08/2022 for this problem. RUQ US done in 08/2022 otherwise negative except for hepatic steatosis. Avoid identified trigger factors. GI referral placed.

## 2023-09-02 ENCOUNTER — Encounter: Payer: Self-pay | Admitting: Neurology

## 2023-09-02 DIAGNOSIS — Z0279 Encounter for issue of other medical certificate: Secondary | ICD-10-CM

## 2023-09-02 NOTE — Telephone Encounter (Signed)
Patient paid $25 form fee. Pt would like forms to be faxed in once complete to [phone]. Forms are in Dr. Moises Blood box

## 2023-09-14 ENCOUNTER — Ambulatory Visit: Payer: 59 | Admitting: Family Medicine

## 2023-09-14 ENCOUNTER — Encounter: Payer: Self-pay | Admitting: Family Medicine

## 2023-09-14 ENCOUNTER — Other Ambulatory Visit: Payer: Self-pay | Admitting: Family Medicine

## 2023-09-14 VITALS — BP 126/83 | HR 67 | Temp 98.0°F | Ht 67.0 in | Wt 220.0 lb

## 2023-09-14 DIAGNOSIS — R632 Polyphagia: Secondary | ICD-10-CM

## 2023-09-14 DIAGNOSIS — R7303 Prediabetes: Secondary | ICD-10-CM | POA: Diagnosis not present

## 2023-09-14 DIAGNOSIS — G43809 Other migraine, not intractable, without status migrainosus: Secondary | ICD-10-CM

## 2023-09-14 DIAGNOSIS — Z6834 Body mass index (BMI) 34.0-34.9, adult: Secondary | ICD-10-CM

## 2023-09-14 DIAGNOSIS — K760 Fatty (change of) liver, not elsewhere classified: Secondary | ICD-10-CM | POA: Diagnosis not present

## 2023-09-14 DIAGNOSIS — E66811 Obesity, class 1: Secondary | ICD-10-CM

## 2023-09-14 MED ORDER — TOPIRAMATE ER 50 MG PO CAP24: 50.0000 mg | ORAL_CAPSULE | Freq: Every day | ORAL | 0 refills | Status: DC

## 2023-09-14 NOTE — Assessment & Plan Note (Signed)
 Lab Results  Component Value Date   HGBA1C 6.2 (H) 08/17/2023   Stable.  Patient has been unable to tolerate metformin  and now Rybelsus  due to adverse side effect.  She is actively working on reducing her intake of added sugar and refined carbohydrates.  She has implemented more high-fiber carbohydrates, nonstarchy vegetables and lean meats.  She does plan on increasing her exercise frequency over the next several months and have a good support system at home.  Stop Rybelsus  due to side effects of dizziness.

## 2023-09-14 NOTE — Assessment & Plan Note (Signed)
 Improved on topiramate  ER 50 mg once daily without adverse side effects In addition, she has been a reduction of migraine frequency She is not at risk for pregnancy in a female relationship

## 2023-09-14 NOTE — Assessment & Plan Note (Signed)
 Patient has metabolic consequences of obesity including hepatic steatosis and prediabetes. She is actively working on weight reduction, reducing intake of added sugar and saturated fat.  Visceral fat rating is under 10 Continue to work on weight reduction Reviewed plan of care on after visit summary

## 2023-09-14 NOTE — Progress Notes (Signed)
 Office: [phone]  /  Fax: [phone]  WEIGHT SUMMARY AND BIOMETRICS  Starting Date: 03/18/22  Starting Weight: 229lb   Weight Lost Since Last Visit: 0lb   Vitals Temp: 98 F (36.7 C) BP: 126/83 Pulse Rate: 67 SpO2: 100 %   Body Composition  Body Fat %: 39.9 % Fat Mass (lbs): 88 lbs Muscle Mass (lbs): 126 lbs Total Body Water  (lbs): 83.8 lbs Visceral Fat Rating : 9    HPI  Chief Complaint: OBESITY  Sue Jackson is here to discuss her progress with her obesity treatment plan. She is on the food journal and adhering to recommended goals of 1800 calories and 110 g protein and states she is following her eating plan approximately 80-90 % of the time. She states she is exercising 30-45 minutes 2-3 times per week.  Interval History:  Since last office visit she is up 1 lb This gives her a net weight loss of 9 lb in the past 1.5 years She is overall feeling better and has seen inches lost in her mid section She did try Rybelsus  in the morning but has been having dizziness She was working out consistently but has been having irregular menses She plans to see her OB gyn about her irregular periods She is doing better with her food choices and meal planning Emotional eating is under better control Her support at home has improved  Pharmacotherapy: Topiramate  ER 50 mg once daily  PHYSICAL EXAM:  Blood pressure 126/83, pulse 67, temperature 98 F (36.7 C), height 5' 7 (1.702 m), weight 220 lb (99.8 kg), SpO2 100%. Body mass index is 34.46 kg/m.  General: She is overweight, cooperative, alert, well developed, and in no acute distress. PSYCH: Has normal mood, affect and thought process.   Lungs: Normal breathing effort, no conversational dyspnea.   ASSESSMENT AND PLAN  TREATMENT PLAN FOR OBESITY:  Recommended Dietary Goals  Sue Jackson is currently in the action stage of change. As such, her goal is to continue weight management plan. She has agreed to keeping a  food journal and adhering to recommended goals of 1800 calories and 100+ grams of protein.  Behavioral Intervention  We discussed the following Behavioral Modification Strategies today: increasing vegetables, increasing fiber rich foods, increasing water  intake , work on tracking and journaling calories using tracking application, keeping healthy foods at home, practice mindfulness eating and understand the difference between hunger signals and cravings, work on managing stress, creating time for self-care and relaxation, avoiding temptations and identifying enticing environmental cues, and continue to work on maintaining a reduced calorie state, getting the recommended amount of protein, incorporating whole foods, making healthy choices, staying well hydrated and practicing mindfulness when eating..  Additional resources provided today: NA  Recommended Physical Activity Goals  Sue Jackson has been advised to work up to 150 minutes of moderate intensity aerobic activity a week and strengthening exercises 2-3 times per week for cardiovascular health, weight loss maintenance and preservation of muscle mass.   She has agreed to Work on scheduling and tracking physical activity.  Aim for cardio and resistance training exercise 4 to 5 days a week  Pharmacotherapy changes for the treatment of obesity: Discontinue Rybelsus  due to adverse side effect  ASSOCIATED CONDITIONS ADDRESSED TODAY  Polyphagia Assessment & Plan: Improved on topiramate  ER 50 mg once daily without adverse side effects In addition, she has been a reduction of migraine frequency She is not at risk for pregnancy in a female relationship  Orders: -  Topiramate  ER; Take 1 capsule (50 mg total) by mouth daily with supper.  Dispense: 90 capsule; Refill: 0  Vestibular migraine -     Topiramate  ER; Take 1 capsule (50 mg total) by mouth daily with supper.  Dispense: 90 capsule; Refill: 0  Class 1 obesity due to excess calories with  body mass index (BMI) of 34.0 to 34.9 in adult, unspecified whether serious comorbidity present  Hepatic steatosis Assessment & Plan: Patient has metabolic consequences of obesity including hepatic steatosis and prediabetes. She is actively working on weight reduction, reducing intake of added sugar and saturated fat.  Visceral fat rating is under 10 Continue to work on weight reduction Reviewed plan of care on after visit summary   Prediabetes Assessment & Plan: Lab Results  Component Value Date   HGBA1C 6.2 (H) 08/17/2023   Stable.  Patient has been unable to tolerate metformin  and now Rybelsus  due to adverse side effect.  She is actively working on reducing her intake of added sugar and refined carbohydrates.  She has implemented more high-fiber carbohydrates, nonstarchy vegetables and lean meats.  She does plan on increasing her exercise frequency over the next several months and have a good support system at home.  Stop Rybelsus  due to side effects of dizziness.       She was informed of the importance of frequent follow up visits to maximize her success with intensive lifestyle modifications for her multiple health conditions.   ATTESTASTION STATEMENTS:  Reviewed by clinician on day of visit: allergies, medications, problem list, medical history, surgical history, family history, social history, and previous encounter notes pertinent to obesity diagnosis.   I have personally spent 30 minutes total time today in preparation, patient care, nutritional counseling and documentation for this visit, including the following: review of clinical lab tests; review of medical tests/procedures/services.      Sue FORBES Haddock, DO DABFM, DABOM [facility] [address] [place], [place] [address] [phone]

## 2023-09-18 ENCOUNTER — Telehealth: Payer: 59 | Admitting: Family

## 2023-09-18 DIAGNOSIS — R42 Dizziness and giddiness: Secondary | ICD-10-CM | POA: Diagnosis not present

## 2023-09-18 MED ORDER — MECLIZINE HCL 25 MG PO TABS: 25.0000 mg | ORAL_TABLET | Freq: Three times a day (TID) | ORAL | 1 refills | Status: DC | PRN

## 2023-09-18 NOTE — Patient Instructions (Signed)
 Vertigo Vertigo is the feeling that you or the things around you are moving or spinning when they're not. It's different than feeling dizzy. It can also cause: Loss of balance. Trouble standing or walking. Nausea and vomiting. This feeling can come and go at any time. It can last from a few seconds to minutes or even hours. It may go away on its own or be treated with medicine. What are the types of vertigo? There are two types of vertigo: Peripheral vertigo happens when parts of your inner ear don't work like they should. This is the more common type. Central vertigo happens when your brain and spinal cord don't work like they should. Your health care provider will do tests to find out what kind of vertigo you have. This will help them decide on the right treatment for you. Follow these instructions at home: Eating and drinking Drink enough fluid to keep your pee (urine) pale yellow. Do not drink alcohol. Activity When you get up in the morning, first sit up on the side of the bed. When you feel okay, stand slowly while holding onto something. Move slowly. Avoid sudden body or head movements. Avoid certain positions, as told by your provider. Use a cane if you have trouble standing or walking. Sit down right away if you feel unsteady. Place items in your home so they're easy for you to reach without bending or leaning over. Return to normal activities when you're told. Ask what things are safe for you to do. General instructions Take your medicines only as told by your provider. Contact a health care provider if: Your medicines don't help or make your vertigo worse. You get new symptoms. You have a fever. You have nausea or vomiting. Your family or friends spot any changes in how you're acting. A part of your body goes numb. You feel tingling and prickling in a part of your body. You get very bad headaches. Get help right away if: You're always dizzy or you faint. You have a  stiff neck. You have trouble moving or speaking. Your hands, arms, or legs feel weak. Your hearing or eyesight changes. These symptoms may be an emergency. Call 911 right away. Do not wait to see if the symptoms will go away. Do not drive yourself to the hospital. This information is not intended to replace advice given to you by your health care provider. Make sure you discuss any questions you have with your health care provider. Document Revised: 04/28/2023 Document Reviewed: 10/29/2022 Elsevier Patient Education  2024 [employer].

## 2023-09-18 NOTE — Progress Notes (Signed)
 Virtual Visit Consent   Sue Jackson, you are scheduled for a virtual visit with a [facility] provider today. Just as with appointments in the office, your consent must be obtained to participate. Your consent will be active for this visit and any virtual visit you may have with one of our providers in the next 365 days. If you have a MyChart account, a copy of this consent can be sent to you electronically.  As this is a virtual visit, video technology does not allow for your provider to perform a traditional examination. This may limit your provider's ability to fully assess your condition. If your provider identifies any concerns that need to be evaluated in person or the need to arrange testing (such as labs, EKG, etc.), we will make arrangements to do so. Although advances in technology are sophisticated, we cannot ensure that it will always work on either your end or our end. If the connection with a video visit is poor, the visit may have to be switched to a telephone visit. With either a video or telephone visit, we are not always able to ensure that we have a secure connection.  By engaging in this virtual visit, you consent to the provision of healthcare and authorize for your insurance to be billed (if applicable) for the services provided during this visit. Depending on your insurance coverage, you may receive a charge related to this service.  I need to obtain your verbal consent now. Are you willing to proceed with your visit today? Sue Jackson has provided verbal consent on 09/18/2023 for a virtual visit (video or telephone). Bari Learn, FNP  Date: 09/18/2023 5:03 PM  Virtual Visit via Video Note   I, Bari Learn, connected with  Sue Jackson  ([id number], 08-Sep-1980) on [birth year] at  5:00 PM EST by a video-enabled telemedicine application and verified that I am speaking with the correct person using two identifiers.  Location: Patient: Virtual  Visit Location Patient: Home Provider: Virtual Visit Location Provider: Home Office   I discussed the limitations of evaluation and management by telemedicine and the availability of in person appointments. The patient expressed understanding and agreed to proceed.    History of Present Illness: Sue Jackson is a 43 y.o. who identifies as a female who was assigned female at birth, and is being seen today for vertigo that started today. Has a hx of vertigo and needs a work note.   HPI: Dizziness This is a recurrent problem. The current episode started today. The problem occurs constantly. The problem has been unchanged. Associated symptoms include headaches (resolved) and vertigo. Pertinent negatives include no abdominal pain, anorexia, arthralgias, change in bowel habit, chest pain, chills, congestion, fatigue, fever, joint swelling, myalgias, nausea, swollen glands, urinary symptoms, visual change or vomiting.    Problems:  Patient Active Problem List   Diagnosis Date Noted   Hepatic steatosis 08/31/2023   Excessive thirst 08/17/2023   Iron deficiency anemia due to chronic blood loss 05/18/2023   Generalized obesity with starting BMI 35 09/22/2022   Headache, unspecified headache type 08/14/2022   Vestibular migraine 08/13/2022   RUQ abdominal pain 08/12/2022   Polyphagia 07/29/2022   Postoperative state 07/06/2022   Fibroids 05/19/2022   Uterine leiomyoma 04/20/2022   Vitamin D  deficiency 04/01/2022   Insulin  resistance 04/01/2022   Depression 04/01/2022   Malignant neoplasm of right female breast, unspecified estrogen receptor status, unspecified site of breast (HCC) 08/13/2021   Genetic testing 08/20/2020  Family history of breast cancer    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   Bipolar 2 disorder (HCC) 11/16/2019   Prediabetes 08/15/2018   Anxiety 02/06/2018   Low back pain radiating to right lower extremity 07/06/2017   Upper back pain,  chronic 07/06/2017   Hypertension, essential, benign 03/11/2017    Allergies: No Known Allergies Medications:  Current Outpatient Medications:    meclizine  (ANTIVERT ) 25 MG tablet, Take 1 tablet (25 mg total) by mouth 3 (three) times daily as needed for dizziness., Disp: 60 tablet, Rfl: 1   albuterol  (VENTOLIN  HFA) 108 (90 Base) MCG/ACT inhaler, Inhale 1-2 puffs into the lungs every 6 (six) hours as needed., Disp: 8 g, Rfl: 0   amLODipine -benazepril  (LOTREL) 5-10 MG capsule, TAKE 1 CAPSULE BY MOUTH EVERY DAY, Disp: 90 capsule, Rfl: 0   cetirizine  (ZYRTEC  ALLERGY) 10 MG tablet, Take 1 tablet (10 mg total) by mouth daily., Disp: 90 tablet, Rfl: 1   cyclobenzaprine  (FLEXERIL ) 5 MG tablet, Take 1 tablet (5 mg total) by mouth 3 (three) times daily as needed for muscle spasms., Disp: 21 tablet, Rfl: 0   ferrous gluconate  (FERGON) 324 MG tablet, Take 1 tablet (324 mg total) by mouth daily., Disp: 30 tablet, Rfl: 1   fluticasone  (FLONASE ) 50 MCG/ACT nasal spray, Place 2 sprays into both nostrils daily., Disp: 16 g, Rfl: 0   gabapentin  (NEURONTIN ) 100 MG capsule, Take 1 capsule (100 mg total) by mouth 3 (three) times daily., Disp: 90 capsule, Rfl: 3   gabapentin  (NEURONTIN ) 400 MG capsule, Take 1 capsule (400 mg total) by mouth 3 (three) times daily., Disp: 90 capsule, Rfl: 3   Galcanezumab -gnlm (EMGALITY ) 120 MG/ML SOAJ, INJECT 120 MG INTO THE SKIN EVERY 28 DAYS., Disp: 1 mL, Rfl: 5   hydrOXYzine  (ATARAX ) 10 MG tablet, TAKE 1 TABLET BY MOUTH THREE TIMES A DAY AS NEEDED, Disp: 270 tablet, Rfl: 2   ibuprofen  (ADVIL ) 600 MG tablet, Take 1 tablet (600 mg total) by mouth every 8 (eight) hours as needed., Disp: 30 tablet, Rfl: 0   ipratropium (ATROVENT ) 0.03 % nasal spray, Place 2 sprays into both nostrils every 12 (twelve) hours., Disp: 30 mL, Rfl: 0   loperamide  (IMODIUM  A-D) 2 MG tablet, Take 1 tablet (2 mg total) by mouth 4 (four) times daily as needed for diarrhea or loose stools., Disp: 30 tablet, Rfl:  0   polyethylene glycol powder (GLYCOLAX /MIRALAX ) 17 GM/SCOOP powder, Take 17 g by mouth 2 (two) times daily as needed., Disp: 3350 g, Rfl: 1   SUMAtriptan  (IMITREX ) 50 MG tablet, Take 1 tablet (50 mg total) by mouth as needed for migraine. May repeat in 2 hours if headache persists or recurs.  Maximum 2 tablets in 24 hours., Disp: 10 tablet, Rfl: 5   Topiramate  ER (TROKENDI  XR) 50 MG CP24, Take 1 capsule (50 mg total) by mouth daily with supper., Disp: 90 capsule, Rfl: 0  Observations/Objective: Patient is well-developed, well-nourished in no acute distress.  Resting comfortably  at home.  Head is normocephalic, atraumatic.  No labored breathing.  Speech is clear and coherent with logical content.  Patient is alert and oriented at baseline.  Normal Neuro exam  Assessment and Plan: 1. Vertigo (Primary) - meclizine  (ANTIVERT ) 25 MG tablet; Take 1 tablet (25 mg total) by mouth 3 (three) times daily as needed for dizziness.  Dispense: 60 tablet; Refill: 1  Force fluids Antivert  TID prn  Avoid fast position changes or driving  Follow up if  symptoms worsen or do not improve   Follow Up Instructions: I discussed the assessment and treatment plan with the patient. The patient was provided an opportunity to ask questions and all were answered. The patient agreed with the plan and demonstrated an understanding of the instructions.  A copy of instructions were sent to the patient via MyChart unless otherwise noted below.    The patient was advised to call back or seek an in-person evaluation if the symptoms worsen or if the condition fails to improve as anticipated.    Bari Learn, FNP

## 2023-09-20 ENCOUNTER — Encounter: Payer: Self-pay | Admitting: Family Medicine

## 2023-09-20 NOTE — Telephone Encounter (Signed)
Can you re-send to [facility]?

## 2023-09-23 ENCOUNTER — Encounter: Payer: Self-pay | Admitting: Neurology

## 2023-09-27 NOTE — Telephone Encounter (Signed)
Resent to [facility] in [place].  [phone]  [address] [place], [place], [place] [address]

## 2023-10-19 ENCOUNTER — Encounter: Payer: Self-pay | Admitting: Family Medicine

## 2023-10-19 ENCOUNTER — Telehealth (INDEPENDENT_AMBULATORY_CARE_PROVIDER_SITE_OTHER): Payer: 59 | Admitting: Family Medicine

## 2023-10-19 VITALS — Ht 67.0 in

## 2023-10-19 DIAGNOSIS — E66811 Obesity, class 1: Secondary | ICD-10-CM | POA: Diagnosis not present

## 2023-10-19 DIAGNOSIS — N926 Irregular menstruation, unspecified: Secondary | ICD-10-CM | POA: Diagnosis not present

## 2023-10-19 DIAGNOSIS — Z6834 Body mass index (BMI) 34.0-34.9, adult: Secondary | ICD-10-CM

## 2023-10-19 DIAGNOSIS — G43809 Other migraine, not intractable, without status migrainosus: Secondary | ICD-10-CM

## 2023-10-19 DIAGNOSIS — R7303 Prediabetes: Secondary | ICD-10-CM

## 2023-10-19 NOTE — Progress Notes (Signed)
 Office: [phone]  /  Fax: [phone]  WEIGHT SUMMARY AND BIOMETRICS  Starting Date: 03/18/22  Starting Weight: 229 lb   No data recorded  No data recorded No data recorded I connected with  Sue Jackson on 10/19/23 by a video and audio enabled telemedicine application and verified that I am speaking with the correct person using two identifiers.  Patient Location: Home  Provider Location: Office/Clinic  I discussed the limitations of evaluation and management by telemedicine. The patient expressed understanding and agreed to proceed.   HPI  Chief Complaint: OBESITY  Sue Jackson is here to discuss her progress with her obesity treatment plan. She is on the keeping a food journal and adhering to recommended goals of 1800 calories and 100+ g of  protein and practicing portion control and making smarter food choices, such as increasing vegetables and decreasing simple carbohydrates and states she is following her eating plan approximately 70 % of the time. She states she is exercising 30 minutes 2 times per week.   Interval History:  Since last office visit she has not weighed She is seen via telemedicine today due to DUB and migraine She has been struggling with heavy, prolonged menstrual cycles and more frequent migraines She remains mindful of her food choices, snacks and portion sizes She would like to increase workouts but she has been feeling poorly She has a net weight loss of 9 lb in 1.5 years  Pharmacotherapy: Topirmate ER 50 mg daily Previous adverse SE from metformin, Rybelsus, Qsymia  PHYSICAL EXAM:  Height 5\' 7"  (1.702 m). Body mass index is 34.46 kg/m.  General: She is overweight, cooperative, alert, well developed, and in no acute distress. PSYCH: Has normal mood, affect and thought process.   Lungs: Normal breathing effort, no conversational dyspnea.   ASSESSMENT AND PLAN  TREATMENT PLAN FOR OBESITY:  Recommended Dietary  Goals  Sue Jackson is currently in the action stage of change. As such, her goal is to continue weight management plan. She has agreed to keeping a food journal and adhering to recommended goals of 1800 calories and 120 g of  protein.  Behavioral Intervention  We discussed the following Behavioral Modification Strategies today: increasing lean protein intake to established goals, increasing fiber rich foods, increasing water intake , work on meal planning and preparation, keeping healthy foods at home, work on managing stress, creating time for self-care and relaxation, avoiding temptations and identifying enticing environmental cues, planning for success, and continue to work on maintaining a reduced calorie state, getting the recommended amount of protein, incorporating whole foods, making healthy choices, staying well hydrated and practicing mindfulness when eating..  Additional resources provided today: NA  Recommended Physical Activity Goals  Sue Jackson has been advised to work up to 150 minutes of moderate intensity aerobic activity a week and strengthening exercises 2-3 times per week for cardiovascular health, weight loss maintenance and preservation of muscle mass.   She has agreed to [employer] strengthening exercises with a goal of 2-3 sessions a week   Pharmacotherapy changes for the treatment of obesity: none  ASSOCIATED CONDITIONS ADDRESSED TODAY  Menstrual bleeding problem She reports taking mediation tor educe menstrual bleeding by her OB gyn, notes not available for review but she has been bleeding thru these due to her physically active job.  She has a known hx of fibroids.  Prolonged/ heavy bleeding is leading to fatigue and more frequent Headaches.  Plan: f/u with OB gyn re: prolonged heavy menses impacting quality of life Begin  OTC iron supplement once daily  Class 1 obesity due to excess calories with serious comorbidity and body mass index (BMI) of 34.0 to 34.9 in  adult  Prediabetes Lab Results  Component Value Date   HGBA1C 6.2 (H) 08/17/2023   She has been intolerant to metformin and rybelsus.  She is actively working on [employer] and weight reduction.  She plans to ramp up her workouts and keep diet low sugar/ lower carb. Continue healthy lifestyle changes Repeat A1c in Summer  Vestibular migraine Worsening She has had more frequent headaches which has been effecting her ability to workout She sees Dr Everlena Cooper for management She is doing well on Topiramate without adverse SE      She was informed of the importance of frequent follow up visits to maximize her success with intensive lifestyle modifications for her multiple health conditions.   ATTESTASTION STATEMENTS:  Reviewed by clinician on day of visit: allergies, medications, problem list, medical history, surgical history, family history, social history, and previous encounter notes pertinent to obesity diagnosis.   I have personally spent 30 minutes total time today in preparation, patient care, nutritional counseling and documentation for this visit, including the following: review of clinical lab tests; review of medical tests/procedures/services.      Sue Brink, DO DABFM, DABOM [facility] [address] [place], [place] [address] [phone]

## 2023-10-21 ENCOUNTER — Telehealth (HOSPITAL_COMMUNITY): Payer: Self-pay | Admitting: Psychiatry

## 2023-10-21 NOTE — Telephone Encounter (Signed)
Thank you for your update 

## 2023-10-21 NOTE — Telephone Encounter (Signed)
 This patient called and states she has relocated to [place] from [place] and would like to change to providers to this office for both medication management and therapy. Any issues with this transfer of care?

## 2023-10-22 ENCOUNTER — Other Ambulatory Visit: Payer: Self-pay | Admitting: Family Medicine

## 2023-10-22 DIAGNOSIS — I10 Essential (primary) hypertension: Secondary | ICD-10-CM

## 2023-10-27 ENCOUNTER — Encounter (HOSPITAL_COMMUNITY): Payer: Self-pay

## 2023-10-27 ENCOUNTER — Ambulatory Visit (INDEPENDENT_AMBULATORY_CARE_PROVIDER_SITE_OTHER): Payer: Self-pay | Admitting: Licensed Clinical Social Worker

## 2023-10-27 DIAGNOSIS — Z91199 Patient's noncompliance with other medical treatment and regimen due to unspecified reason: Secondary | ICD-10-CM

## 2023-10-27 NOTE — Progress Notes (Signed)
 Patient did not show for appointment.

## 2023-10-28 ENCOUNTER — Ambulatory Visit (HOSPITAL_COMMUNITY): Payer: Self-pay | Admitting: Psychiatry

## 2023-10-28 ENCOUNTER — Telehealth: Admitting: Physician Assistant

## 2023-10-28 DIAGNOSIS — F419 Anxiety disorder, unspecified: Secondary | ICD-10-CM | POA: Diagnosis not present

## 2023-10-28 DIAGNOSIS — R42 Dizziness and giddiness: Secondary | ICD-10-CM

## 2023-10-28 NOTE — Progress Notes (Signed)
 Virtual Visit Consent   Sue Jackson, you are scheduled for a virtual visit with a [facility] provider today. Just as with appointments in the office, your consent must be obtained to participate. Your consent will be active for this visit and any virtual visit you may have with one of our providers in the next 365 days. If you have a MyChart account, a copy of this consent can be sent to you electronically.  As this is a virtual visit, video technology does not allow for your provider to perform a traditional examination. This may limit your provider's ability to fully assess your condition. If your provider identifies any concerns that need to be evaluated in person or the need to arrange testing (such as labs, EKG, etc.), we will make arrangements to do so. Although advances in technology are sophisticated, we cannot ensure that it will always work on either your end or our end. If the connection with a video visit is poor, the visit may have to be switched to a telephone visit. With either a video or telephone visit, we are not always able to ensure that we have a secure connection.  By engaging in this virtual visit, you consent to the provision of healthcare and authorize for your insurance to be billed (if applicable) for the services provided during this visit. Depending on your insurance coverage, you may receive a charge related to this service.  I need to obtain your verbal consent now. Are you willing to proceed with your visit today? Flavia Bruss has provided verbal consent on 10/28/2023 for a virtual visit (video or telephone). Margaretann Loveless, PA-C  Date: 10/28/2023 6:27 PM   Virtual Visit via Video Note   I, Margaretann Loveless, connected with  Sue Jackson  ([id number], [birth year]) on 10/28/23 at  6:15 PM EDT by a video-enabled telemedicine application and verified that I am speaking with the correct person using two  identifiers.  Location: Patient: Virtual Visit Location Patient: Home Provider: Virtual Visit Location Provider: Home Office   I discussed the limitations of evaluation and management by telemedicine and the availability of in person appointments. The patient expressed understanding and agreed to proceed.    History of Present Illness: Sue Jackson is a 43 y.o. who identifies as a female who was assigned female at birth, and is being seen today for acute anxiety and vertigo. Patient with known vertigo triggered by anxiety and also has vestibular migraines. Today had taken her dog to the the vet and dog was accidentally dosed with double the medication they were suppose to give him. Advised her to monitor her dog for changes. This increased her anxiety for today and has caused her vertigo. She did also take a meclizine and this causes drowsiness, making it unsafe for her to drive.    Problems:  Patient Active Problem List   Diagnosis Date Noted   Menstrual bleeding problem 10/19/2023   Hepatic steatosis 08/31/2023   Excessive thirst 08/17/2023   Iron deficiency anemia due to chronic blood loss 05/18/2023   Generalized obesity with starting BMI 35 09/22/2022   Headache, unspecified headache type 08/14/2022   Vestibular migraine 08/13/2022   RUQ abdominal pain 08/12/2022   Polyphagia 07/29/2022   Postoperative state 07/06/2022   Fibroids 05/19/2022   Uterine leiomyoma 04/20/2022   Vitamin D deficiency 04/01/2022   Insulin resistance 04/01/2022   Depression 04/01/2022   Malignant neoplasm of right female breast, unspecified estrogen receptor status, unspecified site  of breast (HCC) 08/13/2021   Genetic testing 08/20/2020   Family history of breast cancer    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   Bipolar 2 disorder (HCC) 11/16/2019   Prediabetes 08/15/2018   Anxiety 02/06/2018   Low back pain radiating to right lower extremity 07/06/2017   Upper back  pain, chronic 07/06/2017   Hypertension, essential, benign 03/11/2017    Allergies: No Known Allergies Medications:  Current Outpatient Medications:    albuterol (VENTOLIN HFA) 108 (90 Base) MCG/ACT inhaler, Inhale 1-2 puffs into the lungs every 6 (six) hours as needed., Disp: 8 g, Rfl: 0   amLODipine-benazepril (LOTREL) 5-10 MG capsule, TAKE 1 CAPSULE BY MOUTH EVERY DAY, Disp: 90 capsule, Rfl: 2   cetirizine (ZYRTEC ALLERGY) 10 MG tablet, Take 1 tablet (10 mg total) by mouth daily., Disp: 90 tablet, Rfl: 1   cyclobenzaprine (FLEXERIL) 5 MG tablet, Take 1 tablet (5 mg total) by mouth 3 (three) times daily as needed for muscle spasms., Disp: 21 tablet, Rfl: 0   ferrous gluconate (FERGON) 324 MG tablet, Take 1 tablet (324 mg total) by mouth daily., Disp: 30 tablet, Rfl: 1   fluticasone (FLONASE) 50 MCG/ACT nasal spray, Place 2 sprays into both nostrils daily., Disp: 16 g, Rfl: 0   gabapentin (NEURONTIN) 100 MG capsule, Take 1 capsule (100 mg total) by mouth 3 (three) times daily., Disp: 90 capsule, Rfl: 3   gabapentin (NEURONTIN) 400 MG capsule, Take 1 capsule (400 mg total) by mouth 3 (three) times daily., Disp: 90 capsule, Rfl: 3   Galcanezumab-gnlm (EMGALITY) 120 MG/ML SOAJ, INJECT 120 MG INTO THE SKIN EVERY 28 DAYS., Disp: 1 mL, Rfl: 5   hydrOXYzine (ATARAX) 10 MG tablet, TAKE 1 TABLET BY MOUTH THREE TIMES A DAY AS NEEDED, Disp: 270 tablet, Rfl: 2   ibuprofen (ADVIL) 600 MG tablet, Take 1 tablet (600 mg total) by mouth every 8 (eight) hours as needed., Disp: 30 tablet, Rfl: 0   ipratropium (ATROVENT) 0.03 % nasal spray, Place 2 sprays into both nostrils every 12 (twelve) hours., Disp: 30 mL, Rfl: 0   loperamide (IMODIUM A-D) 2 MG tablet, Take 1 tablet (2 mg total) by mouth 4 (four) times daily as needed for diarrhea or loose stools., Disp: 30 tablet, Rfl: 0   meclizine (ANTIVERT) 25 MG tablet, Take 1 tablet (25 mg total) by mouth 3 (three) times daily as needed for dizziness., Disp: 60 tablet,  Rfl: 1   polyethylene glycol powder (GLYCOLAX/MIRALAX) 17 GM/SCOOP powder, Take 17 g by mouth 2 (two) times daily as needed., Disp: 3350 g, Rfl: 1   SUMAtriptan (IMITREX) 50 MG tablet, Take 1 tablet (50 mg total) by mouth as needed for migraine. May repeat in 2 hours if headache persists or recurs.  Maximum 2 tablets in 24 hours., Disp: 10 tablet, Rfl: 5   Topiramate ER (TROKENDI XR) 50 MG CP24, Take 1 capsule (50 mg total) by mouth daily with supper., Disp: 90 capsule, Rfl: 0  Observations/Objective: Patient is well-developed, well-nourished in no acute distress.  Resting comfortably at home.  Head is normocephalic, atraumatic.  No labored breathing. Speech is clear and coherent with logical content.  Patient is alert and oriented at baseline.    Assessment and Plan: 1. Acute anxiety (Primary)  2. Vertigo  - Continue to rest - Use Meclizine as needed - Work note provided - Seek in person evaluation if anxiety or vertigo worsen  Follow Up Instructions: I discussed the assessment and treatment  plan with the patient. The patient was provided an opportunity to ask questions and all were answered. The patient agreed with the plan and demonstrated an understanding of the instructions.  A copy of instructions were sent to the patient via MyChart unless otherwise noted below.    The patient was advised to call back or seek an in-person evaluation if the symptoms worsen or if the condition fails to improve as anticipated.    Margaretann Loveless, PA-C

## 2023-10-28 NOTE — Patient Instructions (Signed)
 Dimple Nanas, thank you for joining Margaretann Loveless, PA-C for today's virtual visit.  While this provider is not your primary care provider (PCP), if your PCP is located in our provider database this encounter information will be shared with them immediately following your visit.   A [facility] MyChart account gives you access to today's visit and all your visits, tests, and labs performed at [facility] " click here if you don't have a [facility] MyChart account or go to [email or web address]  Consent: (Patient) Sue Jackson provided verbal consent for this virtual visit at the beginning of the encounter.  Current Medications:  Current Outpatient Medications:    albuterol (VENTOLIN HFA) 108 (90 Base) MCG/ACT inhaler, Inhale 1-2 puffs into the lungs every 6 (six) hours as needed., Disp: 8 g, Rfl: 0   amLODipine-benazepril (LOTREL) 5-10 MG capsule, TAKE 1 CAPSULE BY MOUTH EVERY DAY, Disp: 90 capsule, Rfl: 2   cetirizine (ZYRTEC ALLERGY) 10 MG tablet, Take 1 tablet (10 mg total) by mouth daily., Disp: 90 tablet, Rfl: 1   cyclobenzaprine (FLEXERIL) 5 MG tablet, Take 1 tablet (5 mg total) by mouth 3 (three) times daily as needed for muscle spasms., Disp: 21 tablet, Rfl: 0   ferrous gluconate (FERGON) 324 MG tablet, Take 1 tablet (324 mg total) by mouth daily., Disp: 30 tablet, Rfl: 1   fluticasone (FLONASE) 50 MCG/ACT nasal spray, Place 2 sprays into both nostrils daily., Disp: 16 g, Rfl: 0   gabapentin (NEURONTIN) 100 MG capsule, Take 1 capsule (100 mg total) by mouth 3 (three) times daily., Disp: 90 capsule, Rfl: 3   gabapentin (NEURONTIN) 400 MG capsule, Take 1 capsule (400 mg total) by mouth 3 (three) times daily., Disp: 90 capsule, Rfl: 3   Galcanezumab-gnlm (EMGALITY) 120 MG/ML SOAJ, INJECT 120 MG INTO THE SKIN EVERY 28 DAYS., Disp: 1 mL, Rfl: 5   hydrOXYzine (ATARAX) 10 MG tablet, TAKE 1 TABLET BY MOUTH THREE TIMES A DAY AS NEEDED, Disp: 270  tablet, Rfl: 2   ibuprofen (ADVIL) 600 MG tablet, Take 1 tablet (600 mg total) by mouth every 8 (eight) hours as needed., Disp: 30 tablet, Rfl: 0   ipratropium (ATROVENT) 0.03 % nasal spray, Place 2 sprays into both nostrils every 12 (twelve) hours., Disp: 30 mL, Rfl: 0   loperamide (IMODIUM A-D) 2 MG tablet, Take 1 tablet (2 mg total) by mouth 4 (four) times daily as needed for diarrhea or loose stools., Disp: 30 tablet, Rfl: 0   meclizine (ANTIVERT) 25 MG tablet, Take 1 tablet (25 mg total) by mouth 3 (three) times daily as needed for dizziness., Disp: 60 tablet, Rfl: 1   polyethylene glycol powder (GLYCOLAX/MIRALAX) 17 GM/SCOOP powder, Take 17 g by mouth 2 (two) times daily as needed., Disp: 3350 g, Rfl: 1   SUMAtriptan (IMITREX) 50 MG tablet, Take 1 tablet (50 mg total) by mouth as needed for migraine. May repeat in 2 hours if headache persists or recurs.  Maximum 2 tablets in 24 hours., Disp: 10 tablet, Rfl: 5   Topiramate ER (TROKENDI XR) 50 MG CP24, Take 1 capsule (50 mg total) by mouth daily with supper., Disp: 90 capsule, Rfl: 0   Medications ordered in this encounter:  No orders of the defined types were placed in this encounter.    *If you need refills on other medications prior to your next appointment, please contact your pharmacy*  Follow-Up: Call back or seek an in-person evaluation if the symptoms worsen or if the  condition fails to improve as anticipated.  [facility] [phone]   If you have been instructed to have an in-person evaluation today at a local Urgent Care facility, please use the link below. It will take you to a list of all of our available [facility], including address, phone number and hours of operation. Please do not delay care.  [facility]  If you or a family member do not have a primary care provider, use the link below to schedule a visit and establish care. When you choose a [facility] primary care physician  or advanced practice provider, you gain a long-term partner in health. Find a Primary Care Provider  Learn more about [facility] in-office and virtual care options:  - Get Care Now

## 2023-11-10 ENCOUNTER — Ambulatory Visit (HOSPITAL_COMMUNITY): Admitting: Licensed Clinical Social Worker

## 2023-11-16 ENCOUNTER — Ambulatory Visit: Admitting: Bariatrics

## 2023-11-16 ENCOUNTER — Encounter: Payer: Self-pay | Admitting: Family Medicine

## 2023-11-16 ENCOUNTER — Ambulatory Visit: Admitting: Family Medicine

## 2023-11-16 VITALS — BP 131/83 | HR 69 | Temp 97.9°F | Ht 67.0 in | Wt 225.0 lb

## 2023-11-16 DIAGNOSIS — F411 Generalized anxiety disorder: Secondary | ICD-10-CM | POA: Insufficient documentation

## 2023-11-16 DIAGNOSIS — R7303 Prediabetes: Secondary | ICD-10-CM | POA: Diagnosis not present

## 2023-11-16 DIAGNOSIS — E66812 Obesity, class 2: Secondary | ICD-10-CM

## 2023-11-16 DIAGNOSIS — R42 Dizziness and giddiness: Secondary | ICD-10-CM

## 2023-11-16 DIAGNOSIS — Z6835 Body mass index (BMI) 35.0-35.9, adult: Secondary | ICD-10-CM

## 2023-11-16 DIAGNOSIS — D500 Iron deficiency anemia secondary to blood loss (chronic): Secondary | ICD-10-CM | POA: Diagnosis not present

## 2023-11-16 DIAGNOSIS — R632 Polyphagia: Secondary | ICD-10-CM | POA: Diagnosis not present

## 2023-11-16 MED ORDER — TOPIRAMATE ER 100 MG PO CAP24: 100.0000 mg | ORAL_CAPSULE | Freq: Every day | ORAL | 0 refills | Status: DC

## 2023-11-16 NOTE — Patient Instructions (Addendum)
 Go up on Trokendi XR to 100 mg once a day  Continue to work on eating on a schedule 3 meals and 2 snacks per day  Hydrate well with water and sugar free electrolytes  Work on distraction/ stress reduction/ schedule a vacation  Follow up with OB for heavy menses  Keep upcoming visits with psych  Light exercise 30 min daily

## 2023-11-16 NOTE — Progress Notes (Signed)
 Office: [phone]  /  Fax: [phone]  WEIGHT SUMMARY AND BIOMETRICS  Starting Date: 03/18/22  Starting Weight: 229lb   Weight Lost Since Last Visit: 5lb   Vitals Temp: 97.9 F (36.6 C) BP: 131/83 Pulse Rate: 69 SpO2: 99 %   Body Composition  Body Fat %: 40.3 % Fat Mass (lbs): 90.8 lbs Muscle Mass (lbs): 127.6 lbs Total Body Water (lbs): 86.8 lbs Visceral Fat Rating : 10   HPI  Chief Complaint: OBESITY  Jettie is here to discuss her progress with her obesity treatment plan. She is on the keeping a food journal and adhering to recommended goals of 1800 calories and 110 protein and states she is following her eating plan approximately 90 % of the time. She states she is exercising 30-45 minutes 2 times per week.  Interval History:  Since last office visit she is up 5 lb This given her a net weight loss of 4 lb in 1.5 years She continues to have heavy menses with iron def anemia She has had more issues with dizziness She is not taking her iron She has had constipation even without constipation She has been very anxious and has been out of work She is getting ready to see a new counselor She has had a lot more hunger and cravings  Pharmacotherapy: Trokendi XR 50 mg daily  PHYSICAL EXAM:  Blood pressure 131/83, pulse 69, temperature 97.9 F (36.6 C), height 5\' 7"  (1.702 m), weight 225 lb (102.1 kg), SpO2 99%. Body mass index is 35.24 kg/m.  General: She is overweight, cooperative, alert, well developed, and in no acute distress. PSYCH: Has normal mood, affect and thought process.   Lungs: Normal breathing effort, no conversational dyspnea.   ASSESSMENT AND PLAN  TREATMENT PLAN FOR OBESITY:  Recommended Dietary Goals  Sinda is currently in the action stage of change. As such, her goal is to continue weight management plan. She has agreed to keeping a food journal and adhering to recommended goals of 1800 calories and 110 g of  protein and  practicing portion control and making smarter food choices, such as increasing vegetables and decreasing simple carbohydrates.  Behavioral Intervention  We discussed the following Behavioral Modification Strategies today: increasing lean protein intake to established goals, increasing fiber rich foods, increasing water intake , work on meal planning and preparation, work on Counselling psychologist calories using tracking application, keeping healthy foods at home, practice mindfulness eating and understand the difference between hunger signals and cravings, work on managing stress, creating time for self-care and relaxation, continue to practice mindfulness when eating, planning for success, and continue to work on maintaining a reduced calorie state, getting the recommended amount of protein, incorporating whole foods, making healthy choices, staying well hydrated and practicing mindfulness when eating..  Additional resources provided today: NA  Recommended Physical Activity Goals  Joliene has been advised to work up to 150 minutes of moderate intensity aerobic activity a week and strengthening exercises 2-3 times per week for cardiovascular health, weight loss maintenance and preservation of muscle mass.   She has agreed to [employer] strengthening exercises with a goal of 2-3 sessions a week   Pharmacotherapy changes for the treatment of obesity: increase Trokendi XR to 100 mg daily  ASSOCIATED CONDITIONS ADDRESSED TODAY  Polyphagia Worsening Likely due to high stress levels Denies meal skipping and is prioritizing lean protein and fiber Work on eating on a schedule OK to add 1-2 fruit servings in per day for fiber -  Topiramate ER; Take 1 capsule (100 mg total) by mouth daily.  Dispense: 90 capsule; Refill: 0  Prediabetes Lab Results  Component Value Date   HGBA1C 6.2 (H) 08/17/2023   She has had feelings of dizziness, polyphagia and polydipsia Check labs today Limit sugar and  starches Resume workouts once dizziness improves -     Hemoglobin A1c  Dizzy Hx of vestibular migraines Managed by Dr Everlena Cooper Worsened by anxiety and IDA -     Basic metabolic panel with GFR  Iron deficiency anemia due to chronic blood loss Off oral iron supplement due to constipation Still having menorraghia Seeing [facility] in Sept  Check labs today and advise her to contact her OB if heavy bleeding continues -     CBC -     Ferritin -     Iron and TIBC  Class 2 severe obesity due to excess calories with serious comorbidity and body mass index (BMI) of 35.0 to 35.9 in adult [facility])    GAD (generalized anxiety disorder) Worsened by work stress C/o being out of meds for anxiety Has good support at home and has missed more days of work due to anxiety  Has upcoming visit with a  counselor and psychiatry F/u with PCP for any med refills while awaiting psych visit Work on self care, sleep, good nutrition      She was informed of the importance of frequent follow up visits to maximize her success with intensive lifestyle modifications for her multiple health conditions.   ATTESTASTION STATEMENTS:  Reviewed by clinician on day of visit: allergies, medications, problem list, medical history, surgical history, family history, social history, and previous encounter notes pertinent to obesity diagnosis.   I have personally spent 33 minutes total time today in preparation, patient care, nutritional counseling and education,  and documentation for this visit, including the following: review of most recent clinical lab tests, prescribing medications/ refilling medications, reviewing medical assistant documentation, review and interpretation of bioimpedence results.     Seymour Bars, D.O. DABFM, DABOM [facility] [address] [place], [place] [address] [phone]

## 2023-11-17 LAB — BASIC METABOLIC PANEL WITH GFR
BUN/Creatinine Ratio: 32 — ABNORMAL HIGH (ref 9–23)
BUN: 27 mg/dL — ABNORMAL HIGH (ref 6–24)
CO2: 21 mmol/L (ref 20–29)
Calcium: 9 mg/dL (ref 8.7–10.2)
Chloride: 99 mmol/L (ref 96–106)
Creatinine, Ser: 0.85 mg/dL (ref 0.57–1.00)
Glucose: 100 mg/dL — ABNORMAL HIGH (ref 70–99)
Potassium: 4 mmol/L (ref 3.5–5.2)
Sodium: 136 mmol/L (ref 134–144)
eGFR: 87 mL/min/{1.73_m2} (ref >59)

## 2023-11-17 LAB — CBC
Hematocrit: 44.6 % (ref 34.0–46.6)
Hemoglobin: 14.3 g/dL (ref 11.1–15.9)
MCH: 28.1 pg (ref 26.6–33.0)
MCHC: 32.1 g/dL (ref 31.5–35.7)
MCV: 88 fL (ref 79–97)
Platelets: 298 10*3/uL (ref 150–450)
RBC: 5.09 x10E6/uL (ref 3.77–5.28)
RDW: 14.4 % (ref 11.7–15.4)
WBC: 10 10*3/uL (ref 3.4–10.8)

## 2023-11-17 LAB — IRON AND TIBC
Iron Saturation: 15 % (ref 15–55)
Iron: 54 ug/dL (ref 27–159)
Total Iron Binding Capacity: 357 ug/dL (ref 250–450)
UIBC: 303 ug/dL (ref 131–425)

## 2023-11-17 LAB — HEMOGLOBIN A1C
Est. average glucose Bld gHb Est-mCnc: 128 mg/dL
Hgb A1c MFr Bld: 6.1 % — ABNORMAL HIGH (ref 4.8–5.6)

## 2023-11-17 LAB — FERRITIN: Ferritin: 22 ng/mL (ref 15–150)

## 2023-11-25 ENCOUNTER — Telehealth: Admitting: Family Medicine

## 2023-11-25 DIAGNOSIS — R42 Dizziness and giddiness: Secondary | ICD-10-CM

## 2023-11-25 NOTE — Patient Instructions (Signed)
 Vertigo Vertigo is the feeling that you or the things around you are moving or spinning when they're not. It's different than feeling dizzy. It can also cause: Loss of balance. Trouble standing or walking. Nausea and vomiting. This feeling can come and go at any time. It can last from a few seconds to minutes or even hours. It may go away on its own or be treated with medicine. What are the types of vertigo? There are two types of vertigo: Peripheral vertigo happens when parts of your inner ear don't work like they should. This is the more common type. Central vertigo happens when your brain and spinal cord don't work like they should. Your health care provider will do tests to find out what kind of vertigo you have. This will help them decide on the right treatment for you. Follow these instructions at home: Eating and drinking Drink enough fluid to keep your pee (urine) pale yellow. Do not drink alcohol. Activity When you get up in the morning, first sit up on the side of the bed. When you feel okay, stand slowly while holding onto something. Move slowly. Avoid sudden body or head movements. Avoid certain positions, as told by your provider. Use a cane if you have trouble standing or walking. Sit down right away if you feel unsteady. Place items in your home so they're easy for you to reach without bending or leaning over. Return to normal activities when you're told. Ask what things are safe for you to do. General instructions Take your medicines only as told by your provider. Contact a health care provider if: Your medicines don't help or make your vertigo worse. You get new symptoms. You have a fever. You have nausea or vomiting. Your family or friends spot any changes in how you're acting. A part of your body goes numb. You feel tingling and prickling in a part of your body. You get very bad headaches. Get help right away if: You're always dizzy or you faint. You have a  stiff neck. You have trouble moving or speaking. Your hands, arms, or legs feel weak. Your hearing or eyesight changes. These symptoms may be an emergency. Call 911 right away. Do not wait to see if the symptoms will go away. Do not drive yourself to the hospital. This information is not intended to replace advice given to you by your health care provider. Make sure you discuss any questions you have with your health care provider. Document Revised: 04/28/2023 Document Reviewed: 10/29/2022 Elsevier Patient Education  2024 [employer].

## 2023-11-25 NOTE — Progress Notes (Signed)
 Virtual Visit Consent   Sue Jackson, you are scheduled for a virtual visit with a [facility] provider today. Just as with appointments in the office, your consent must be obtained to participate. Your consent will be active for this visit and any virtual visit you may have with one of our providers in the next 365 days. If you have a MyChart account, a copy of this consent can be sent to you electronically.  As this is a virtual visit, video technology does not allow for your provider to perform a traditional examination. This may limit your provider's ability to fully assess your condition. If your provider identifies any concerns that need to be evaluated in person or the need to arrange testing (such as labs, EKG, etc.), we will make arrangements to do so. Although advances in technology are sophisticated, we cannot ensure that it will always work on either your end or our end. If the connection with a video visit is poor, the visit may have to be switched to a telephone visit. With either a video or telephone visit, we are not always able to ensure that we have a secure connection.  By engaging in this virtual visit, you consent to the provision of healthcare and authorize for your insurance to be billed (if applicable) for the services provided during this visit. Depending on your insurance coverage, you may receive a charge related to this service.  I need to obtain your verbal consent now. Are you willing to proceed with your visit today? Sue Jackson has provided verbal consent on 11/25/2023 for a virtual visit (video or telephone). [name], FNP  Date: 11/25/2023 2:48 PM   Virtual Visit via Video Note   I, [name], connected with  Sue Jackson  ([id number], 17-Jul-1981) on 11/25/23 at  2:45 PM EDT by a video-enabled telemedicine application and verified that I am speaking with the correct person using two identifiers.  Location: Patient: Virtual  Visit Location Patient: Home Provider: Virtual Visit Location Provider: Home Office   I discussed the limitations of evaluation and management by telemedicine and the availability of in person appointments. The patient expressed understanding and agreed to proceed.    History of Present Illness: Sue Jackson is a 43 y.o. who identifies as a female who was assigned female at birth, and is being seen today for vertigo that is chronic. It is flared from an increase in her weight loss meds etc. She says this feels like it usually does and she can not drive to get to work. She requests a note. [name]  HPI: HPI  Problems:  Patient Active Problem List   Diagnosis Date Noted   GAD (generalized anxiety disorder) 11/16/2023   Menstrual bleeding problem 10/19/2023   Hepatic steatosis 08/31/2023   Excessive thirst 08/17/2023   Iron deficiency anemia due to chronic blood loss 05/18/2023   Generalized obesity with starting BMI 35 09/22/2022   Headache, unspecified headache type 08/14/2022   Vestibular migraine 08/13/2022   RUQ abdominal pain 08/12/2022   Polyphagia 07/29/2022   Postoperative state 07/06/2022   Fibroids 05/19/2022   Uterine leiomyoma 04/20/2022   Vitamin D  deficiency 04/01/2022   Insulin  resistance 04/01/2022   Depression 04/01/2022   Malignant neoplasm of right female breast, unspecified estrogen receptor status, unspecified site of breast (HCC) 08/13/2021   Genetic testing 08/20/2020   Family history of breast cancer    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   Bipolar 2  disorder (HCC) 11/16/2019   Prediabetes 08/15/2018   Anxiety 02/06/2018   Low back pain radiating to right lower extremity 07/06/2017   Upper back pain, chronic 07/06/2017   Hypertension, essential, benign 03/11/2017    Allergies: No Known Allergies Medications:  Current Outpatient Medications:    albuterol  (VENTOLIN  HFA) 108 (90 Base) MCG/ACT inhaler, Inhale 1-2 puffs into the  lungs every 6 (six) hours as needed., Disp: 8 g, Rfl: 0   amLODipine -benazepril  (LOTREL) 5-10 MG capsule, TAKE 1 CAPSULE BY MOUTH EVERY DAY, Disp: 90 capsule, Rfl: 2   cetirizine  (ZYRTEC  ALLERGY) 10 MG tablet, Take 1 tablet (10 mg total) by mouth daily., Disp: 90 tablet, Rfl: 1   cyclobenzaprine  (FLEXERIL ) 5 MG tablet, Take 1 tablet (5 mg total) by mouth 3 (three) times daily as needed for muscle spasms., Disp: 21 tablet, Rfl: 0   ferrous gluconate  (FERGON) 324 MG tablet, Take 1 tablet (324 mg total) by mouth daily., Disp: 30 tablet, Rfl: 1   fluticasone  (FLONASE ) 50 MCG/ACT nasal spray, Place 2 sprays into both nostrils daily., Disp: 16 g, Rfl: 0   gabapentin  (NEURONTIN ) 100 MG capsule, Take 1 capsule (100 mg total) by mouth 3 (three) times daily., Disp: 90 capsule, Rfl: 3   gabapentin  (NEURONTIN ) 400 MG capsule, Take 1 capsule (400 mg total) by mouth 3 (three) times daily., Disp: 90 capsule, Rfl: 3   Galcanezumab -gnlm (EMGALITY ) 120 MG/ML SOAJ, INJECT 120 MG INTO THE SKIN EVERY 28 DAYS., Disp: 1 mL, Rfl: 5   hydrOXYzine  (ATARAX ) 10 MG tablet, TAKE 1 TABLET BY MOUTH THREE TIMES A DAY AS NEEDED, Disp: 270 tablet, Rfl: 2   ibuprofen  (ADVIL ) 600 MG tablet, Take 1 tablet (600 mg total) by mouth every 8 (eight) hours as needed., Disp: 30 tablet, Rfl: 0   ipratropium (ATROVENT ) 0.03 % nasal spray, Place 2 sprays into both nostrils every 12 (twelve) hours., Disp: 30 mL, Rfl: 0   loperamide  (IMODIUM  A-D) 2 MG tablet, Take 1 tablet (2 mg total) by mouth 4 (four) times daily as needed for diarrhea or loose stools., Disp: 30 tablet, Rfl: 0   meclizine  (ANTIVERT ) 25 MG tablet, Take 1 tablet (25 mg total) by mouth 3 (three) times daily as needed for dizziness., Disp: 60 tablet, Rfl: 1   polyethylene glycol powder (GLYCOLAX /MIRALAX ) 17 GM/SCOOP powder, Take 17 g by mouth 2 (two) times daily as needed., Disp: 3350 g, Rfl: 1   SUMAtriptan  (IMITREX ) 50 MG tablet, Take 1 tablet (50 mg total) by mouth as needed for  migraine. May repeat in 2 hours if headache persists or recurs.  Maximum 2 tablets in 24 hours., Disp: 10 tablet, Rfl: 5   Topiramate  ER (TROKENDI  XR) 100 MG CP24, Take 1 capsule (100 mg total) by mouth daily., Disp: 90 capsule, Rfl: 0  Observations/Objective: Patient is well-developed, well-nourished in no acute distress.  Resting comfortably  at home.  Head is normocephalic, atraumatic.  No labored breathing.  Speech is clear and coherent with logical content.  Patient is alert and oriented at baseline.    Assessment and Plan: 1. Vertigo (Primary)  UC if sx persist or worsen. Meclizine  otc.   Follow Up Instructions: I discussed the assessment and treatment plan with the patient. The patient was provided an opportunity to ask questions and all were answered. The patient agreed with the plan and demonstrated an understanding of the instructions.  A copy of instructions were sent to the patient via MyChart unless otherwise noted below.  The patient was advised to call back or seek an in-person evaluation if the symptoms worsen or if the condition fails to improve as anticipated.    Lijah Bourque, FNP

## 2023-11-29 ENCOUNTER — Telehealth: Admitting: Physician Assistant

## 2023-11-29 DIAGNOSIS — N939 Abnormal uterine and vaginal bleeding, unspecified: Secondary | ICD-10-CM

## 2023-11-29 MED ORDER — TRANEXAMIC ACID 650 MG PO TABS
1300.0000 mg | ORAL_TABLET | Freq: Three times a day (TID) | ORAL | 0 refills | Status: DC
Start: 2023-11-29 — End: 2024-06-10

## 2023-11-29 NOTE — Patient Instructions (Signed)
 Sue Jackson, thank you for joining Sue Maillard, PA-C for today's virtual visit.  While this provider is not your primary care provider (PCP), if your PCP is located in our provider database this encounter information will be shared with them immediately following your visit.   A [facility] MyChart account gives you access to today's visit and all your visits, tests, and labs performed at [facility] " click here if you don't have a [facility] MyChart account or go to [email or web address]  Consent: (Patient) Sue Jackson provided verbal consent for this virtual visit at the beginning of the encounter.  Current Medications:  Current Outpatient Medications:    tranexamic acid  (LYSTEDA ) 650 MG TABS tablet, Take 2 tablets (1,300 mg total) by mouth 3 (three) times daily., Disp: 30 tablet, Rfl: 0   albuterol  (VENTOLIN  HFA) 108 (90 Base) MCG/ACT inhaler, Inhale 1-2 puffs into the lungs every 6 (six) hours as needed., Disp: 8 g, Rfl: 0   amLODipine -benazepril  (LOTREL) 5-10 MG capsule, TAKE 1 CAPSULE BY MOUTH EVERY DAY, Disp: 90 capsule, Rfl: 2   cetirizine  (ZYRTEC  ALLERGY) 10 MG tablet, Take 1 tablet (10 mg total) by mouth daily., Disp: 90 tablet, Rfl: 1   cyclobenzaprine  (FLEXERIL ) 5 MG tablet, Take 1 tablet (5 mg total) by mouth 3 (three) times daily as needed for muscle spasms., Disp: 21 tablet, Rfl: 0   ferrous gluconate  (FERGON) 324 MG tablet, Take 1 tablet (324 mg total) by mouth daily., Disp: 30 tablet, Rfl: 1   fluticasone  (FLONASE ) 50 MCG/ACT nasal spray, Place 2 sprays into both nostrils daily., Disp: 16 g, Rfl: 0   gabapentin  (NEURONTIN ) 100 MG capsule, Take 1 capsule (100 mg total) by mouth 3 (three) times daily., Disp: 90 capsule, Rfl: 3   gabapentin  (NEURONTIN ) 400 MG capsule, Take 1 capsule (400 mg total) by mouth 3 (three) times daily., Disp: 90 capsule, Rfl: 3   Galcanezumab -gnlm (EMGALITY ) 120 MG/ML SOAJ, INJECT 120 MG INTO THE SKIN  EVERY 28 DAYS., Disp: 1 mL, Rfl: 5   hydrOXYzine  (ATARAX ) 10 MG tablet, TAKE 1 TABLET BY MOUTH THREE TIMES A DAY AS NEEDED, Disp: 270 tablet, Rfl: 2   ibuprofen  (ADVIL ) 600 MG tablet, Take 1 tablet (600 mg total) by mouth every 8 (eight) hours as needed., Disp: 30 tablet, Rfl: 0   ipratropium (ATROVENT ) 0.03 % nasal spray, Place 2 sprays into both nostrils every 12 (twelve) hours., Disp: 30 mL, Rfl: 0   loperamide  (IMODIUM  A-D) 2 MG tablet, Take 1 tablet (2 mg total) by mouth 4 (four) times daily as needed for diarrhea or loose stools., Disp: 30 tablet, Rfl: 0   meclizine  (ANTIVERT ) 25 MG tablet, Take 1 tablet (25 mg total) by mouth 3 (three) times daily as needed for dizziness., Disp: 60 tablet, Rfl: 1   polyethylene glycol powder (GLYCOLAX /MIRALAX ) 17 GM/SCOOP powder, Take 17 g by mouth 2 (two) times daily as needed., Disp: 3350 g, Rfl: 1   SUMAtriptan  (IMITREX ) 50 MG tablet, Take 1 tablet (50 mg total) by mouth as needed for migraine. May repeat in 2 hours if headache persists or recurs.  Maximum 2 tablets in 24 hours., Disp: 10 tablet, Rfl: 5   Topiramate  ER (TROKENDI  XR) 100 MG CP24, Take 1 capsule (100 mg total) by mouth daily., Disp: 90 capsule, Rfl: 0   Medications ordered in this encounter:  Meds ordered this encounter  Medications   tranexamic acid  (LYSTEDA ) 650 MG TABS tablet    Sig: Take 2 tablets (  1,300 mg total) by mouth 3 (three) times daily.    Dispense:  30 tablet    Refill:  0    Supervising Provider:   LAMPTEY, PHILIP O [[id number]]     *If you need refills on other medications prior to your next appointment, please contact your pharmacy*  Follow-Up: Call back or seek an in-person evaluation if the symptoms worsen or if the condition fails to improve as anticipated.  [facility] [phone]  Other Instructions Tranexamic Acid  Tablets What is this medication? TRANEXAMIC ACID  (TRAN ex AM ik AS id) treats heavy periods. It works by preventing blood clots  from breaking down too quickly. This helps your blood clot normally, which reduces blood loss. This medicine may be used for other purposes; ask your health care provider or pharmacist if you have questions. COMMON BRAND NAME(S): Cyklokapron , Lysteda  What should I tell my care team before I take this medication? They need to know if you have any of these conditions: Bleeding in the brain Blood clotting problems Kidney disease Vision problems An unusual or allergic reaction to tranexamic acid , other medications, foods, dyes, or preservatives Pregnant or trying to get pregnant Breastfeeding How should I use this medication? Take this medication by mouth with water . Take it as directed on the prescription label at the same time every day. Do not cut, crush, or chew this medication. Swallow the tablets whole. You can take it with or without food. If it upsets your stomach, take it with food. Do not take this medication until your period has started. Do not take it for more than 5 days in a row. Do not take this medication when you do not have your period. Talk to your care team about the use of this medication in children. While it may be prescribed for children as young as 12 years for selected conditions, precautions do apply. Overdosage: If you think you have taken too much of this medicine contact a poison control center or emergency room at once. NOTE: This medicine is only for you. Do not share this medicine with others. What if I miss a dose? If you miss a dose, take it as soon as you remember. Then, take your next dose at least 6 hours later. Do not take double or extra doses. What may interact with this medication? Do not take this medication with any of the following: Estrogen and progestin hormones This medication may also interact with the following: Certain medications that prevent or treat blood clots Tretinoin This list may not describe all possible interactions. Give your health  care provider a list of all the medicines, herbs, non-prescription drugs, or dietary supplements you use. Also tell them if you smoke, drink alcohol, or use illegal drugs. Some items may interact with your medicine. What should I watch for while using this medication? Visit your care team for regular checks on your progress. Tell your care team if your symptoms do not start to get better or if they get worse. This medication can cause serious eye damage. Tell your care team right away if you have changes in your eyesight. What side effects may I notice from receiving this medication? Side effects that you should report to your care team as soon as possible: Allergic reactions--skin rash, itching, hives, swelling of the face, lips, tongue, or throat Blood clot--pain, swelling, or warmth in the leg, shortness of breath, chest pain Change in vision such as blurry vision, seeing halos around lights, vision loss  Stroke--sudden numbness or weakness of the face, arm, or leg, trouble speaking, confusion, trouble walking, loss of balance or coordination, dizziness, severe headache, change in vision Side effects that usually do not require medical attention (report to your care team if they continue or are bothersome): Bone, joint, or muscle pain Fatigue Headache Runny or stuffy nose Stomach pain This list may not describe all possible side effects. Call your doctor for medical advice about side effects. You may report side effects to FDA at 1-800-FDA-1088. Where should I keep my medication? Keep out of the reach of children and pets. Store at room temperature between 20 and 25 degrees C (68 and 77 degrees F). Get rid of any unused medication after the expiration date. To get rid of medications that are no longer needed or have expired: Take the medication to a medication take-back program. Check with your pharmacy or law enforcement to find a location. If you cannot return the medication, check the  label or package insert to see if the medication should be thrown out in the garbage or flushed down the toilet. If you are not sure, ask your care team. If it is safe to put it in the trash, take the medication out of the container. Mix the medication with cat litter, dirt, coffee grounds, or other unwanted substance. Seal the mixture in a bag or container. Put it in the trash. NOTE: This sheet is a summary. It may not cover all possible information. If you have questions about this medicine, talk to your doctor, pharmacist, or health care provider.  2024 Elsevier/Gold Standard (2022-06-03 00:00:00)   If you have been instructed to have an in-person evaluation today at a local Urgent Care facility, please use the link below. It will take you to a list of all of our available [facility], including address, phone number and hours of operation. Please do not delay care.  [facility]  If you or a family member do not have a primary care provider, use the link below to schedule a visit and establish care. When you choose a [facility] primary care physician or advanced practice provider, you gain a long-term partner in health. Find a Primary Care Provider  Learn more about [facility]'s in-office and virtual care options: [facility] - Get Care Now

## 2023-11-29 NOTE — Progress Notes (Signed)
 Virtual Visit Consent   Sue Jackson, you are scheduled for a virtual visit with a [facility] provider today. Just as with appointments in the office, your consent must be obtained to participate. Your consent will be active for this visit and any virtual visit you may have with one of our providers in the next 365 days. If you have a MyChart account, a copy of this consent can be sent to you electronically.  As this is a virtual visit, video technology does not allow for your provider to perform a traditional examination. This may limit your provider's ability to fully assess your condition. If your provider identifies any concerns that need to be evaluated in person or the need to arrange testing (such as labs, EKG, etc.), we will make arrangements to do so. Although advances in technology are sophisticated, we cannot ensure that it will always work on either your end or our end. If the connection with a video visit is poor, the visit may have to be switched to a telephone visit. With either a video or telephone visit, we are not always able to ensure that we have a secure connection.  By engaging in this virtual visit, you consent to the provision of healthcare and authorize for your insurance to be billed (if applicable) for the services provided during this visit. Depending on your insurance coverage, you may receive a charge related to this service.  I need to obtain your verbal consent now. Are you willing to proceed with your visit today? Sue Jackson has provided verbal consent on 11/29/2023 for a virtual visit (video or telephone). Sue Jackson, [place]  Date: 11/29/2023 4:57 PM   Virtual Visit via Video Note   I, Sue Jackson, connected with  Sue Jackson  ([id number], 1980-09-06) on 11/29/23 at  4:45 PM EDT by a video-enabled telemedicine application and verified that I am speaking with the correct person using two  identifiers.  Location: Patient: Virtual Visit Location Patient: Home Provider: Virtual Visit Location Provider: Home Office   I discussed the limitations of evaluation and management by telemedicine and the availability of in person appointments. The patient expressed understanding and agreed to proceed.    History of Present Illness: Sue Jackson is a 43 y.o. who identifies as a female who was assigned female at birth, and is being seen today for ongoing issue with abnormal menstrual bleeding. Has history of menorrhagia thought to be related to uterine leiomyoma. Notes typically has a very heavy flow the first 2 days of her cycle, which just started for her a day ago. Notes substantial cramping with heavy flow. Denies lightheadedness, SOB. Notes some fatigue which she notes is normal for her. Is taking Lysteda  given to her by her GYN for episodes like this, but notes she only had one day of tablets left and cannot get in with her GYN this week. Denies history of clotting disorder. Is a non-smoker.     HPI: HPI  Problems:  Patient Active Problem List   Diagnosis Date Noted   GAD (generalized anxiety disorder) 11/16/2023   Menstrual bleeding problem 10/19/2023   Hepatic steatosis 08/31/2023   Excessive thirst 08/17/2023   Iron deficiency anemia due to chronic blood loss 05/18/2023   Generalized obesity with starting BMI 35 09/22/2022   Headache, unspecified headache type 08/14/2022   Vestibular migraine 08/13/2022   RUQ abdominal pain 08/12/2022   Polyphagia 07/29/2022   Postoperative state 07/06/2022   Fibroids 05/19/2022  Uterine leiomyoma 04/20/2022   Vitamin D  deficiency 04/01/2022   Insulin  resistance 04/01/2022   Depression 04/01/2022   Malignant neoplasm of right female breast, unspecified estrogen receptor status, unspecified site of breast (HCC) 08/13/2021   Genetic testing 08/20/2020   Family history of breast cancer    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   Bipolar 2 disorder (HCC) 11/16/2019   Prediabetes 08/15/2018   Anxiety 02/06/2018   Low back pain radiating to right lower extremity 07/06/2017   Upper back pain, chronic 07/06/2017   Hypertension, essential, benign 03/11/2017    Allergies: No Known Allergies Medications:  Current Outpatient Medications:    tranexamic acid  (LYSTEDA ) 650 MG TABS tablet, Take 2 tablets (1,300 mg total) by mouth 3 (three) times daily., Disp: 30 tablet, Rfl: 0   albuterol  (VENTOLIN  HFA) 108 (90 Base) MCG/ACT inhaler, Inhale 1-2 puffs into the lungs every 6 (six) hours as needed., Disp: 8 g, Rfl: 0   amLODipine -benazepril  (LOTREL) 5-10 MG capsule, TAKE 1 CAPSULE BY MOUTH EVERY DAY, Disp: 90 capsule, Rfl: 2   cetirizine  (ZYRTEC  ALLERGY) 10 MG tablet, Take 1 tablet (10 mg total) by mouth daily., Disp: 90 tablet, Rfl: 1   cyclobenzaprine  (FLEXERIL ) 5 MG tablet, Take 1 tablet (5 mg total) by mouth 3 (three) times daily as needed for muscle spasms., Disp: 21 tablet, Rfl: 0   ferrous gluconate  (FERGON) 324 MG tablet, Take 1 tablet (324 mg total) by mouth daily., Disp: 30 tablet, Rfl: 1   fluticasone  (FLONASE ) 50 MCG/ACT nasal spray, Place 2 sprays into both nostrils daily., Disp: 16 g, Rfl: 0   gabapentin  (NEURONTIN ) 100 MG capsule, Take 1 capsule (100 mg total) by mouth 3 (three) times daily., Disp: 90 capsule, Rfl: 3   gabapentin  (NEURONTIN ) 400 MG capsule, Take 1 capsule (400 mg total) by mouth 3 (three) times daily., Disp: 90 capsule, Rfl: 3   Galcanezumab -gnlm (EMGALITY ) 120 MG/ML SOAJ, INJECT 120 MG INTO THE SKIN EVERY 28 DAYS., Disp: 1 mL, Rfl: 5   hydrOXYzine  (ATARAX ) 10 MG tablet, TAKE 1 TABLET BY MOUTH THREE TIMES A DAY AS NEEDED, Disp: 270 tablet, Rfl: 2   ibuprofen  (ADVIL ) 600 MG tablet, Take 1 tablet (600 mg total) by mouth every 8 (eight) hours as needed., Disp: 30 tablet, Rfl: 0   ipratropium (ATROVENT ) 0.03 % nasal spray, Place 2 sprays into both nostrils every 12 (twelve)  hours., Disp: 30 mL, Rfl: 0   loperamide  (IMODIUM  A-D) 2 MG tablet, Take 1 tablet (2 mg total) by mouth 4 (four) times daily as needed for diarrhea or loose stools., Disp: 30 tablet, Rfl: 0   meclizine  (ANTIVERT ) 25 MG tablet, Take 1 tablet (25 mg total) by mouth 3 (three) times daily as needed for dizziness., Disp: 60 tablet, Rfl: 1   polyethylene glycol powder (GLYCOLAX /MIRALAX ) 17 GM/SCOOP powder, Take 17 g by mouth 2 (two) times daily as needed., Disp: 3350 g, Rfl: 1   SUMAtriptan  (IMITREX ) 50 MG tablet, Take 1 tablet (50 mg total) by mouth as needed for migraine. May repeat in 2 hours if headache persists or recurs.  Maximum 2 tablets in 24 hours., Disp: 10 tablet, Rfl: 5   Topiramate  ER (TROKENDI  XR) 100 MG CP24, Take 1 capsule (100 mg total) by mouth daily., Disp: 90 capsule, Rfl: 0  Observations/Objective: Patient is well-developed, well-nourished in no acute distress.  Resting comfortably  at home.  Head is normocephalic, atraumatic.  No labored breathing.  Speech is clear and coherent  with logical content.  Patient is alert and oriented at baseline.    Assessment and Plan: 1. Abnormal uterine bleeding (AUB) (Primary) - tranexamic acid  (LYSTEDA ) 650 MG TABS tablet; Take 2 tablets (1,300 mg total) by mouth 3 (three) times daily.  Dispense: 30 tablet; Refill: 0  Ongoing issue with menorrhagia in setting of patient with history of leiomyoma. Needs GYN follow-up for ongoing management and possible surgical intervention. Has taken Lysteda  before without issue. No history of clotting. Will give one-time fill of her Lysteda . Follow-up with GYN as discussed. ER precautions reviewed.   Follow Up Instructions: I discussed the assessment and treatment plan with the patient. The patient was provided an opportunity to ask questions and all were answered. The patient agreed with the plan and demonstrated an understanding of the instructions.  A copy of instructions were sent to the patient via  MyChart unless otherwise noted below.   The patient was advised to call back or seek an in-person evaluation if the symptoms worsen or if the condition fails to improve as anticipated.    Sue Maillard, PA-C

## 2023-12-09 ENCOUNTER — Telehealth: Admitting: Family Medicine

## 2023-12-09 DIAGNOSIS — R42 Dizziness and giddiness: Secondary | ICD-10-CM | POA: Diagnosis not present

## 2023-12-09 DIAGNOSIS — F419 Anxiety disorder, unspecified: Secondary | ICD-10-CM | POA: Diagnosis not present

## 2023-12-09 NOTE — Progress Notes (Signed)
 Virtual Visit Consent   Sue Jackson, you are scheduled for a virtual visit with a [facility] provider today. Just as with appointments in the office, your consent must be obtained to participate. Your consent will be active for this visit and any virtual visit you may have with one of our providers in the next 365 days. If you have a MyChart account, a copy of this consent can be sent to you electronically.  As this is a virtual visit, video technology does not allow for your provider to perform a traditional examination. This may limit your provider's ability to fully assess your condition. If your provider identifies any concerns that need to be evaluated in person or the need to arrange testing (such as labs, EKG, etc.), we will make arrangements to do so. Although advances in technology are sophisticated, we cannot ensure that it will always work on either your end or our end. If the connection with a video visit is poor, the visit may have to be switched to a telephone visit. With either a video or telephone visit, we are not always able to ensure that we have a secure connection.  By engaging in this virtual visit, you consent to the provision of healthcare and authorize for your insurance to be billed (if applicable) for the services provided during this visit. Depending on your insurance coverage, you may receive a charge related to this service.  I need to obtain your verbal consent now. Are you willing to proceed with your visit today? Shaina Mahan has provided verbal consent on 12/09/2023 for a virtual visit (video or telephone). [name], FNP  Date: 12/09/2023 2:50 PM   Virtual Visit via Video Note   I, [name], connected with  Sue Jackson  ([id number], 1981-02-27) on 12/09/23 at  2:45 PM EDT by a video-enabled telemedicine application and verified that I am speaking with the correct person using two identifiers.  Location: Patient: Virtual  Visit Location Patient: Home Provider: Virtual Visit Location Provider: Home Office   I discussed the limitations of evaluation and management by telemedicine and the availability of in person appointments. The patient expressed understanding and agreed to proceed.    History of Present Illness: Sue Jackson is a 43 y.o. who identifies as a female who was assigned female at birth, and is being seen today for flare of vertigo and anxiety. Denies suicidal or homicidal thoughts. She has apptmt with counselor and has contacted [facility]. She wants them to adjust her meds. She has attempted to suicide in the past and doesn't want to " go there again". She feels she is too shaky to work today and needs a note. [name]  HPI: HPI  Problems:  Patient Active Problem List   Diagnosis Date Noted   GAD (generalized anxiety disorder) 11/16/2023   Menstrual bleeding problem 10/19/2023   Hepatic steatosis 08/31/2023   Excessive thirst 08/17/2023   Iron deficiency anemia due to chronic blood loss 05/18/2023   Generalized obesity with starting BMI 35 09/22/2022   Headache, unspecified headache type 08/14/2022   Vestibular migraine 08/13/2022   RUQ abdominal pain 08/12/2022   Polyphagia 07/29/2022   Postoperative state 07/06/2022   Fibroids 05/19/2022   Uterine leiomyoma 04/20/2022   Vitamin D  deficiency 04/01/2022   Insulin  resistance 04/01/2022   Depression 04/01/2022   Malignant neoplasm of right female breast, unspecified estrogen receptor status, unspecified site of breast (HCC) 08/13/2021   Genetic testing 08/20/2020   Family history of  breast cancer    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   Bipolar 2 disorder (HCC) 11/16/2019   Prediabetes 08/15/2018   Anxiety 02/06/2018   Low back pain radiating to right lower extremity 07/06/2017   Upper back pain, chronic 07/06/2017   Hypertension, essential, benign 03/11/2017    Allergies: No Known Allergies Medications:   Current Outpatient Medications:    albuterol  (VENTOLIN  HFA) 108 (90 Base) MCG/ACT inhaler, Inhale 1-2 puffs into the lungs every 6 (six) hours as needed., Disp: 8 g, Rfl: 0   amLODipine -benazepril  (LOTREL) 5-10 MG capsule, TAKE 1 CAPSULE BY MOUTH EVERY DAY, Disp: 90 capsule, Rfl: 2   cetirizine  (ZYRTEC  ALLERGY) 10 MG tablet, Take 1 tablet (10 mg total) by mouth daily., Disp: 90 tablet, Rfl: 1   cyclobenzaprine  (FLEXERIL ) 5 MG tablet, Take 1 tablet (5 mg total) by mouth 3 (three) times daily as needed for muscle spasms., Disp: 21 tablet, Rfl: 0   ferrous gluconate  (FERGON) 324 MG tablet, Take 1 tablet (324 mg total) by mouth daily., Disp: 30 tablet, Rfl: 1   fluticasone  (FLONASE ) 50 MCG/ACT nasal spray, Place 2 sprays into both nostrils daily., Disp: 16 g, Rfl: 0   gabapentin  (NEURONTIN ) 100 MG capsule, Take 1 capsule (100 mg total) by mouth 3 (three) times daily., Disp: 90 capsule, Rfl: 3   gabapentin  (NEURONTIN ) 400 MG capsule, Take 1 capsule (400 mg total) by mouth 3 (three) times daily., Disp: 90 capsule, Rfl: 3   Galcanezumab -gnlm (EMGALITY ) 120 MG/ML SOAJ, INJECT 120 MG INTO THE SKIN EVERY 28 DAYS., Disp: 1 mL, Rfl: 5   hydrOXYzine  (ATARAX ) 10 MG tablet, TAKE 1 TABLET BY MOUTH THREE TIMES A DAY AS NEEDED, Disp: 270 tablet, Rfl: 2   ibuprofen  (ADVIL ) 600 MG tablet, Take 1 tablet (600 mg total) by mouth every 8 (eight) hours as needed., Disp: 30 tablet, Rfl: 0   ipratropium (ATROVENT ) 0.03 % nasal spray, Place 2 sprays into both nostrils every 12 (twelve) hours., Disp: 30 mL, Rfl: 0   loperamide  (IMODIUM  A-D) 2 MG tablet, Take 1 tablet (2 mg total) by mouth 4 (four) times daily as needed for diarrhea or loose stools., Disp: 30 tablet, Rfl: 0   meclizine  (ANTIVERT ) 25 MG tablet, Take 1 tablet (25 mg total) by mouth 3 (three) times daily as needed for dizziness., Disp: 60 tablet, Rfl: 1   polyethylene glycol powder (GLYCOLAX /MIRALAX ) 17 GM/SCOOP powder, Take 17 g by mouth 2 (two) times daily as  needed., Disp: 3350 g, Rfl: 1   SUMAtriptan  (IMITREX ) 50 MG tablet, Take 1 tablet (50 mg total) by mouth as needed for migraine. May repeat in 2 hours if headache persists or recurs.  Maximum 2 tablets in 24 hours., Disp: 10 tablet, Rfl: 5   Topiramate  ER (TROKENDI  XR) 100 MG CP24, Take 1 capsule (100 mg total) by mouth daily., Disp: 90 capsule, Rfl: 0   tranexamic acid  (LYSTEDA ) 650 MG TABS tablet, Take 2 tablets (1,300 mg total) by mouth 3 (three) times daily., Disp: 30 tablet, Rfl: 0  Observations/Objective: Patient is well-developed, well-nourished in no acute distress.  Resting comfortably  at home.  Head is normocephalic, atraumatic.  No labored breathing.  Speech is clear and coherent with logical content.  Patient is alert and oriented at baseline.    Assessment and Plan: 1. Vertigo (Primary)  2. Acute anxiety  Follow up with counselor and treating provider. ED if sx worsen or suicidal thoughts occur.  Follow Up Instructions: I discussed  the assessment and treatment plan with the patient. The patient was provided an opportunity to ask questions and all were answered. The patient agreed with the plan and demonstrated an understanding of the instructions.  A copy of instructions were sent to the patient via MyChart unless otherwise noted below.     The patient was advised to call back or seek an in-person evaluation if the symptoms worsen or if the condition fails to improve as anticipated.    Princes Finger, FNP

## 2023-12-09 NOTE — Patient Instructions (Signed)
 Vertigo Vertigo is the feeling that you or the things around you are moving or spinning when they're not. It's different than feeling dizzy. It can also cause: Loss of balance. Trouble standing or walking. Nausea and vomiting. This feeling can come and go at any time. It can last from a few seconds to minutes or even hours. It may go away on its own or be treated with medicine. What are the types of vertigo? There are two types of vertigo: Peripheral vertigo happens when parts of your inner ear don't work like they should. This is the more common type. Central vertigo happens when your brain and spinal cord don't work like they should. Your health care provider will do tests to find out what kind of vertigo you have. This will help them decide on the right treatment for you. Follow these instructions at home: Eating and drinking Drink enough fluid to keep your pee (urine) pale yellow. Do not drink alcohol. Activity When you get up in the morning, first sit up on the side of the bed. When you feel okay, stand slowly while holding onto something. Move slowly. Avoid sudden body or head movements. Avoid certain positions, as told by your provider. Use a cane if you have trouble standing or walking. Sit down right away if you feel unsteady. Place items in your home so they're easy for you to reach without bending or leaning over. Return to normal activities when you're told. Ask what things are safe for you to do. General instructions Take your medicines only as told by your provider. Contact a health care provider if: Your medicines don't help or make your vertigo worse. You get new symptoms. You have a fever. You have nausea or vomiting. Your family or friends spot any changes in how you're acting. A part of your body goes numb. You feel tingling and prickling in a part of your body. You get very bad headaches. Get help right away if: You're always dizzy or you faint. You have a  stiff neck. You have trouble moving or speaking. Your hands, arms, or legs feel weak. Your hearing or eyesight changes. These symptoms may be an emergency. Call 911 right away. Do not wait to see if the symptoms will go away. Do not drive yourself to the hospital. This information is not intended to replace advice given to you by your health care provider. Make sure you discuss any questions you have with your health care provider. Document Revised: 04/28/2023 Document Reviewed: 10/29/2022 Elsevier Patient Education  2024 Elsevier Inc.Generalized Anxiety Disorder, Adult Generalized anxiety disorder (GAD) is a mental health condition. Unlike normal worries, anxiety related to GAD is not triggered by a specific event. These worries do not fade or get better with time. GAD interferes with relationships, work, and school. GAD symptoms can vary from mild to severe. People with severe GAD can have intense waves of anxiety with physical symptoms that are similar to panic attacks. What are the causes? The exact cause of GAD is not known, but the following are believed to have an impact: Differences in natural brain chemicals. Genes passed down from parents to children. Differences in the way threats are perceived. Development and stress during childhood. Personality. What increases the risk? The following factors may make you more likely to develop this condition: Being female. Having a family history of anxiety disorders. Being very shy. Experiencing very stressful life events, such as the death of a loved one. Having a very stressful family  environment. What are the signs or symptoms? People with GAD often worry excessively about many things in their lives, such as their health and family. Symptoms may also include: Mental and emotional symptoms: Worrying excessively about natural disasters. Fear of being late. Difficulty concentrating. Fears that others are judging your  performance. Physical symptoms: Fatigue. Headaches, muscle tension, muscle twitches, trembling, or feeling shaky. Feeling like your heart is pounding or beating very fast. Feeling out of breath or like you cannot take a deep breath. Having trouble falling asleep or staying asleep, or experiencing restlessness. Sweating. Nausea, diarrhea, or irritable bowel syndrome (IBS). Behavioral symptoms: Experiencing erratic moods or irritability. Avoidance of new situations. Avoidance of people. Extreme difficulty making decisions. How is this diagnosed? This condition is diagnosed based on your symptoms and medical history. You will also have a physical exam. Your health care provider may perform tests to rule out other possible causes of your symptoms. To be diagnosed with GAD, a person must have anxiety that: Is out of his or her control. Affects several different aspects of his or her life, such as work and relationships. Causes distress that makes him or her unable to take part in normal activities. Includes at least three symptoms of GAD, such as restlessness, fatigue, trouble concentrating, irritability, muscle tension, or sleep problems. Before your health care provider can confirm a diagnosis of GAD, these symptoms must be present more days than they are not, and they must last for 6 months or longer. How is this treated? This condition may be treated with: Medicine. Antidepressant medicine is usually prescribed for long-term daily control. Anti-anxiety medicines may be added in severe cases, especially when panic attacks occur. Talk therapy (psychotherapy). Certain types of talk therapy can be helpful in treating GAD by providing support, education, and guidance. Options include: Cognitive behavioral therapy (CBT). People learn coping skills and self-calming techniques to ease their physical symptoms. They learn to identify unrealistic thoughts and behaviors and to replace them with more  appropriate thoughts and behaviors. Acceptance and commitment therapy (ACT). This treatment teaches people how to be mindful as a way to cope with unwanted thoughts and feelings. Biofeedback. This process trains you to manage your body's response (physiological response) through breathing techniques and relaxation methods. You will work with a therapist while machines are used to monitor your physical symptoms. Stress management techniques. These include yoga, meditation, and exercise. A mental health specialist can help determine which treatment is best for you. Some people see improvement with one type of therapy. However, other people require a combination of therapies. Follow these instructions at home: Lifestyle Maintain a consistent routine and schedule. Anticipate stressful situations. Create a plan and allow extra time to work with your plan. Practice stress management or self-calming techniques that you have learned from your therapist or your health care provider. Exercise regularly and spend time outdoors. Eat a healthy diet that includes plenty of vegetables, fruits, whole grains, low-fat dairy products, and lean protein. Do not eat a lot of foods that are high in fat, added sugar, or salt (sodium). Drink plenty of water . Avoid alcohol. Alcohol can increase anxiety. Avoid caffeine and certain over-the-counter cold medicines. These may make you feel worse. Ask your pharmacist which medicines to avoid. General instructions Take over-the-counter and prescription medicines only as told by your health care provider. Understand that you are likely to have setbacks. Accept this and be kind to yourself as you persist to take better care of yourself. Anticipate stressful situations. Create  a plan and allow extra time to work with your plan. Recognize and accept your accomplishments, even if you judge them as small. Spend time with people who care about you. Keep all follow-up visits. This is  important. Where to find more information [employer] of Mental Health: http://www.maynard.net/ Substance Abuse and Mental Health Services: [employer].pt Contact a health care provider if: Your symptoms do not get better. Your symptoms get worse. You have signs of depression, such as: A persistently sad or irritable mood. Loss of enjoyment in activities that used to bring you joy. Change in weight or eating. Changes in sleeping habits. Get help right away if: You have thoughts about hurting yourself or others. If you ever feel like you may hurt yourself or others, or have thoughts about taking your own life, get help right away. Go to your nearest emergency department or: Call your local emergency services (911 in the [place] Call a suicide crisis helpline, such as the National Suicide Prevention Lifeline at (423)264-9291 or 988 in the [place] This is open 24 hours a day in the [place] If you're a Veteran: Call 988 and press 1. This is open 24 hours a day. Text the [employer] at (917)471-2352. Summary Generalized anxiety disorder (GAD) is a mental health condition that involves worry that is not triggered by a specific event. People with GAD often worry excessively about many things in their lives, such as their health and family. GAD may cause symptoms such as restlessness, trouble concentrating, sleep problems, frequent sweating, nausea, diarrhea, headaches, and trembling or muscle twitching. A mental health specialist can help determine which treatment is best for you. Some people see improvement with one type of therapy. However, other people require a combination of therapies. This information is not intended to replace advice given to you by your health care provider. Make sure you discuss any questions you have with your health care provider. Document Revised: 03/10/2023 Document Reviewed: 11/16/2020 Elsevier Patient Education  2024 [employer].

## 2023-12-15 ENCOUNTER — Telehealth (INDEPENDENT_AMBULATORY_CARE_PROVIDER_SITE_OTHER): Admitting: Family Medicine

## 2023-12-15 ENCOUNTER — Telehealth: Payer: Self-pay | Admitting: *Deleted

## 2023-12-15 ENCOUNTER — Encounter: Payer: Self-pay | Admitting: Family Medicine

## 2023-12-15 DIAGNOSIS — E66812 Obesity, class 2: Secondary | ICD-10-CM

## 2023-12-15 DIAGNOSIS — D500 Iron deficiency anemia secondary to blood loss (chronic): Secondary | ICD-10-CM

## 2023-12-15 DIAGNOSIS — F3181 Bipolar II disorder: Secondary | ICD-10-CM | POA: Diagnosis not present

## 2023-12-15 DIAGNOSIS — Z6835 Body mass index (BMI) 35.0-35.9, adult: Secondary | ICD-10-CM

## 2023-12-15 DIAGNOSIS — R632 Polyphagia: Secondary | ICD-10-CM | POA: Diagnosis not present

## 2023-12-15 MED ORDER — TOPIRAMATE ER 50 MG PO CAP24: 50.0000 mg | ORAL_CAPSULE | Freq: Every day | ORAL | 0 refills | Status: DC

## 2023-12-15 NOTE — Progress Notes (Signed)
 Office: [phone]  /  Fax: [phone]  WEIGHT SUMMARY AND BIOMETRICS  No data recorded No data recorded  No data recorded  No data recorded No data recorded I connected with  [name] on 12/15/23 by a video and audio enabled telemedicine application and verified that I am speaking with the correct person using two identifiers.  Patient Location: Home  Provider Location: Office/Clinic  I discussed the limitations of evaluation and management by telemedicine. The patient expressed understanding and agreed to proceed.   HPI  Chief Complaint: OBESITY  Sue Jackson is here to discuss her progress with her obesity treatment plan. She is on the keeping a food journal and adhering to recommended goals of 1800 calories and 100 g  protein and practicing portion control and making smarter food choices, such as increasing vegetables and decreasing simple carbohydrates and states she is following her eating plan approximately 30 % of the time. She states she is exercising 0 minutes 0 times per week.   Interval History:  Since last office visit she is down ? Lbs She is seen via video visit today due to not feeling well Anxiety has been bad and she has not been sleeping well She has appt to see new psychiatrist 5/14 She has not been taking her gabapentin  for anxiety Dizziness worsened with gabapentin  + increased dose of Trokendi  XR at 100 mg daily She has been missing work She is eating 'whatever' with excess hunger Has a good support system at home Not doing any exercise Net down 9 lb in 1.5 years  Pharmacotherapy: Trokendi  XR 100 mg daily Previously failed or had SE from Qsymia , Ryblesus, metformin   PHYSICAL EXAM:  There were no vitals taken for this visit. There is no height or weight on file to calculate BMI.  General: She is overweight, cooperative, alert, well developed, and in no acute distress. PSYCH: Has normal mood, affect and thought process.  Appears  tired Lungs: Normal breathing effort, no conversational dyspnea.   ASSESSMENT AND PLAN  TREATMENT PLAN FOR OBESITY:  Recommended Dietary Goals  Sue Jackson is currently in the action stage of change. As such, her goal is to continue weight management plan. She has agreed to practicing portion control and making smarter food choices, such as increasing vegetables and decreasing simple carbohydrates.  Behavioral Intervention  We discussed the following Behavioral Modification Strategies today: increasing lean protein intake to established goals, increasing lower glycemic fruits, increasing fiber rich foods, increasing water  intake , work on meal planning and preparation, practice mindfulness eating and understand the difference between hunger signals and cravings, work on managing stress, creating time for self-care and relaxation, avoiding temptations and identifying enticing environmental cues, and continue to work on maintaining a reduced calorie state, getting the recommended amount of protein, incorporating whole foods, making healthy choices, staying well hydrated and practicing mindfulness when eating..  Additional resources provided today: NA  Recommended Physical Activity Goals  [name] has been advised to work up to 150 minutes of moderate intensity aerobic activity a week and strengthening exercises 2-3 times per week for cardiovascular health, weight loss maintenance and preservation of muscle mass.   She has agreed to Think about enjoyable ways to increase daily physical activity and overcoming barriers to exercise and Increase physical activity in their day and reduce sedentary time (increase NEAT).  Pharmacotherapy changes for the treatment of obesity: reduce Trokendi  XR to 50 mg daily  ASSOCIATED CONDITIONS ADDRESSED TODAY  Bipolar 2 disorder (HCC) Worsening with poor sleep,  anxious mood and missed work days Has upcoming visit with psychiatry Keep this appt and take all meds  as prescribed  Polyphagia Though appetite improved with increasing dose of Trokendi  XR to 100 mg, she had dizziness in combination with gabapentin .  Mood and lack of sleep are worsening the overconsumption of food and poor food choices.  We discussed the importance of taking care of mental health first. Keep junk food out of the house Eat on a schedule, increasing low calorie density high fiber foods for improved satiety Aim for 100+ g of protein intake daily Reduce Trokendi  XR back to 50 mg daily -     Topiramate  ER; Take 1 capsule (50 mg total) by mouth daily.  Dispense: 30 capsule; Refill: 0  Iron deficiency anemia due to chronic blood loss Lab Results  Component Value Date   FERRITIN 22 11/16/2023   On ferrous gluconte 324 mg daily with IDA due to DUB Seeing her OB gyn, notes reviewed from Jonn Nett PA-C,  4/22 on Lysteda  1300 mg tid Keep upcoming visits with OB for f/u from medical management Iron levels will be due next visit  Class 2 severe obesity due to excess calories with serious comorbidity and body mass index (BMI) of 35.0 to 35.9 in adult [facility])      She was informed of the importance of frequent follow up visits to maximize her success with intensive lifestyle modifications for her multiple health conditions.   ATTESTASTION STATEMENTS:  Reviewed by clinician on day of visit: allergies, medications, problem list, medical history, surgical history, family history, social history, and previous encounter notes pertinent to obesity diagnosis.   I have personally spent 30 minutes total time today in preparation, patient care, nutritional counseling and education,  and documentation for this visit, including the following: review of most recent clinical lab tests, prescribing medications/ refilling medications, reviewing medical assistant documentation, review and interpretation of bioimpedence results.     Sue Jackson, D.O. DABFM, DABOM [facility] [address] [place], [place] [address] [phone]

## 2023-12-20 NOTE — Telephone Encounter (Signed)
 N/a

## 2023-12-21 ENCOUNTER — Ambulatory Visit (HOSPITAL_COMMUNITY): Admitting: Licensed Clinical Social Worker

## 2023-12-21 DIAGNOSIS — F3181 Bipolar II disorder: Secondary | ICD-10-CM | POA: Diagnosis not present

## 2023-12-21 DIAGNOSIS — F331 Major depressive disorder, recurrent, moderate: Secondary | ICD-10-CM

## 2023-12-21 DIAGNOSIS — F411 Generalized anxiety disorder: Secondary | ICD-10-CM | POA: Diagnosis not present
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
## 2023-12-22 ENCOUNTER — Ambulatory Visit: Payer: 59 | Admitting: Neurology

## 2023-12-22 ENCOUNTER — Other Ambulatory Visit: Payer: Self-pay | Admitting: Neurology

## 2023-12-27 ENCOUNTER — Telehealth: Admitting: Physician Assistant

## 2023-12-27 DIAGNOSIS — G43809 Other migraine, not intractable, without status migrainosus: Secondary | ICD-10-CM

## 2023-12-27 NOTE — Patient Instructions (Signed)
 Sue Jackson, thank you for joining Char Common Ward, PA-C for today's virtual visit.  While this provider is not your primary care provider (PCP), if your PCP is located in our provider database this encounter information will be shared with them immediately following your visit.   A [facility] MyChart account gives you access to today's visit and all your visits, tests, and labs performed at [facility] " click here if you don't have a [facility] MyChart account or go to [email or web address]  Consent: (Patient) Sue Jackson provided verbal consent for this virtual visit at the beginning of the encounter.  Current Medications:  Current Outpatient Medications:    albuterol  (VENTOLIN  HFA) 108 (90 Base) MCG/ACT inhaler, Inhale 1-2 puffs into the lungs every 6 (six) hours as needed., Disp: 8 g, Rfl: 0   amLODipine -benazepril  (LOTREL) 5-10 MG capsule, TAKE 1 CAPSULE BY MOUTH EVERY DAY, Disp: 90 capsule, Rfl: 2   cetirizine  (ZYRTEC  ALLERGY) 10 MG tablet, Take 1 tablet (10 mg total) by mouth daily., Disp: 90 tablet, Rfl: 1   cyclobenzaprine  (FLEXERIL ) 5 MG tablet, Take 1 tablet (5 mg total) by mouth 3 (three) times daily as needed for muscle spasms., Disp: 21 tablet, Rfl: 0   ferrous gluconate  (FERGON) 324 MG tablet, Take 1 tablet (324 mg total) by mouth daily., Disp: 30 tablet, Rfl: 1   fluticasone  (FLONASE ) 50 MCG/ACT nasal spray, Place 2 sprays into both nostrils daily., Disp: 16 g, Rfl: 0   gabapentin  (NEURONTIN ) 100 MG capsule, Take 1 capsule (100 mg total) by mouth 3 (three) times daily., Disp: 90 capsule, Rfl: 3   gabapentin  (NEURONTIN ) 400 MG capsule, Take 1 capsule (400 mg total) by mouth 3 (three) times daily., Disp: 90 capsule, Rfl: 3   Galcanezumab -gnlm (EMGALITY ) 120 MG/ML SOAJ, INJECT 120 MG INTO THE SKIN EVERY 28 DAYS., Disp: 1 mL, Rfl: 5   hydrOXYzine  (ATARAX ) 10 MG tablet, TAKE 1 TABLET BY MOUTH THREE TIMES A DAY AS NEEDED, Disp: 270 tablet,  Rfl: 2   ibuprofen  (ADVIL ) 600 MG tablet, Take 1 tablet (600 mg total) by mouth every 8 (eight) hours as needed., Disp: 30 tablet, Rfl: 0   ipratropium (ATROVENT ) 0.03 % nasal spray, Place 2 sprays into both nostrils every 12 (twelve) hours., Disp: 30 mL, Rfl: 0   loperamide  (IMODIUM  A-D) 2 MG tablet, Take 1 tablet (2 mg total) by mouth 4 (four) times daily as needed for diarrhea or loose stools., Disp: 30 tablet, Rfl: 0   meclizine  (ANTIVERT ) 25 MG tablet, Take 1 tablet (25 mg total) by mouth 3 (three) times daily as needed for dizziness., Disp: 60 tablet, Rfl: 1   polyethylene glycol powder (GLYCOLAX /MIRALAX ) 17 GM/SCOOP powder, Take 17 g by mouth 2 (two) times daily as needed., Disp: 3350 g, Rfl: 1   SUMAtriptan  (IMITREX ) 50 MG tablet, Take 1 tablet (50 mg total) by mouth as needed for migraine. May repeat in 2 hours if headache persists or recurs.  Maximum 2 tablets in 24 hours., Disp: 10 tablet, Rfl: 5   Topiramate  ER (TROKENDI  XR) 50 MG CP24, Take 1 capsule (50 mg total) by mouth daily., Disp: 30 capsule, Rfl: 0   tranexamic acid  (LYSTEDA ) 650 MG TABS tablet, Take 2 tablets (1,300 mg total) by mouth 3 (three) times daily., Disp: 30 tablet, Rfl: 0   Medications ordered in this encounter:  No orders of the defined types were placed in this encounter.    *If you need refills on other medications prior  to your next appointment, please contact your pharmacy*  Follow-Up: Call back or seek an in-person evaluation if the symptoms worsen or if the condition fails to improve as anticipated.  [facility] [phone]  Other Instructions Continue with your current migraine medications.  If no improvement please reach out to neurologist.    If you have been instructed to have an in-person evaluation today at a local Urgent Care facility, please use the link below. It will take you to a list of all of our available [facility], including address, phone number and hours of  operation. Please do not delay care.  [facility]  If you or a family member do not have a primary care provider, use the link below to schedule a visit and establish care. When you choose a [facility] primary care physician or advanced practice provider, you gain a long-term partner in health. Find a Primary Care Provider  Learn more about [facility]'s in-office and virtual care options: [facility] - Get Care Now

## 2023-12-27 NOTE — Progress Notes (Signed)
 Virtual Visit Consent   Sue Jackson, you are scheduled for a virtual visit with a [facility] provider today. Just as with appointments in the office, your consent must be obtained to participate. Your consent will be active for this visit and any virtual visit you may have with one of our providers in the next 365 days. If you have a MyChart account, a copy of this consent can be sent to you electronically.  As this is a virtual visit, video technology does not allow for your provider to perform a traditional examination. This may limit your provider's ability to fully assess your condition. If your provider identifies any concerns that need to be evaluated in person or the need to arrange testing (such as labs, EKG, etc.), we will make arrangements to do so. Although advances in technology are sophisticated, we cannot ensure that it will always work on either your end or our end. If the connection with a video visit is poor, the visit may have to be switched to a telephone visit. With either a video or telephone visit, we are not always able to ensure that we have a secure connection.  By engaging in this virtual visit, you consent to the provision of healthcare and authorize for your insurance to be billed (if applicable) for the services provided during this visit. Depending on your insurance coverage, you may receive a charge related to this service.  I need to obtain your verbal consent now. Are you willing to proceed with your visit today? Sue Jackson has provided verbal consent on 12/27/2023 for a virtual visit (video or telephone). Sue Common Ward, PA-C  Date: 12/27/2023 6:04 PM   Virtual Visit via Video Note   I, Sue Jackson, connected with  Sue Jackson  ([id number], 1980-12-08) on 12/27/23 at  6:00 PM EDT by a video-enabled telemedicine application and verified that I am speaking with the correct person using two identifiers.  Location: Patient:  Virtual Visit Location Patient: Home Provider: Virtual Visit Location Provider: Home Office   I discussed the limitations of evaluation and management by telemedicine and the availability of in person appointments. The patient expressed understanding and agreed to proceed.    History of Present Illness: Sue Jackson is a 43 y.o. who identifies as a female who was assigned female at birth, and is being seen today for a migraines.  She take emgality  regularly.  She missed her dose last week because the pharmacy was out of stock.  She had to leave work early yesterday due to headache. She has an appointment with neurologist in November.  Today's headache feels similar to her migraines.  She has taken sumatriptan  and meclizine  prescribed.   HPI: HPI  Problems:  Patient Active Problem List   Diagnosis Date Noted   GAD (generalized anxiety disorder) 11/16/2023   Menstrual bleeding problem 10/19/2023   Hepatic steatosis 08/31/2023   Excessive thirst 08/17/2023   Iron deficiency anemia due to chronic blood loss 05/18/2023   Generalized obesity with starting BMI 35 09/22/2022   Headache, unspecified headache type 08/14/2022   Vestibular migraine 08/13/2022   RUQ abdominal pain 08/12/2022   Polyphagia 07/29/2022   Postoperative state 07/06/2022   Fibroids 05/19/2022   Uterine leiomyoma 04/20/2022   Vitamin D  deficiency 04/01/2022   Insulin  resistance 04/01/2022   Depression 04/01/2022   Malignant neoplasm of right female breast, unspecified estrogen receptor status, unspecified site of breast (HCC) 08/13/2021   Genetic testing 08/20/2020  Family history of breast cancer    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   Bipolar 2 disorder (HCC) 11/16/2019   Prediabetes 08/15/2018   Anxiety 02/06/2018   Low back pain radiating to right lower extremity 07/06/2017   Upper back pain, chronic 07/06/2017   Hypertension, essential, benign 03/11/2017    Allergies: No  Known Allergies Medications:  Current Outpatient Medications:    albuterol  (VENTOLIN  HFA) 108 (90 Base) MCG/ACT inhaler, Inhale 1-2 puffs into the lungs every 6 (six) hours as needed., Disp: 8 g, Rfl: 0   amLODipine -benazepril  (LOTREL) 5-10 MG capsule, TAKE 1 CAPSULE BY MOUTH EVERY DAY, Disp: 90 capsule, Rfl: 2   cetirizine  (ZYRTEC  ALLERGY) 10 MG tablet, Take 1 tablet (10 mg total) by mouth daily., Disp: 90 tablet, Rfl: 1   cyclobenzaprine  (FLEXERIL ) 5 MG tablet, Take 1 tablet (5 mg total) by mouth 3 (three) times daily as needed for muscle spasms., Disp: 21 tablet, Rfl: 0   ferrous gluconate  (FERGON) 324 MG tablet, Take 1 tablet (324 mg total) by mouth daily., Disp: 30 tablet, Rfl: 1   fluticasone  (FLONASE ) 50 MCG/ACT nasal spray, Place 2 sprays into both nostrils daily., Disp: 16 g, Rfl: 0   gabapentin  (NEURONTIN ) 100 MG capsule, Take 1 capsule (100 mg total) by mouth 3 (three) times daily., Disp: 90 capsule, Rfl: 3   gabapentin  (NEURONTIN ) 400 MG capsule, Take 1 capsule (400 mg total) by mouth 3 (three) times daily., Disp: 90 capsule, Rfl: 3   Galcanezumab -gnlm (EMGALITY ) 120 MG/ML SOAJ, INJECT 120 MG INTO THE SKIN EVERY 28 DAYS., Disp: 1 mL, Rfl: 5   hydrOXYzine  (ATARAX ) 10 MG tablet, TAKE 1 TABLET BY MOUTH THREE TIMES A DAY AS NEEDED, Disp: 270 tablet, Rfl: 2   ibuprofen  (ADVIL ) 600 MG tablet, Take 1 tablet (600 mg total) by mouth every 8 (eight) hours as needed., Disp: 30 tablet, Rfl: 0   ipratropium (ATROVENT ) 0.03 % nasal spray, Place 2 sprays into both nostrils every 12 (twelve) hours., Disp: 30 mL, Rfl: 0   loperamide  (IMODIUM  A-D) 2 MG tablet, Take 1 tablet (2 mg total) by mouth 4 (four) times daily as needed for diarrhea or loose stools., Disp: 30 tablet, Rfl: 0   meclizine  (ANTIVERT ) 25 MG tablet, Take 1 tablet (25 mg total) by mouth 3 (three) times daily as needed for dizziness., Disp: 60 tablet, Rfl: 1   polyethylene glycol powder (GLYCOLAX /MIRALAX ) 17 GM/SCOOP powder, Take 17 g by  mouth 2 (two) times daily as needed., Disp: 3350 g, Rfl: 1   SUMAtriptan  (IMITREX ) 50 MG tablet, Take 1 tablet (50 mg total) by mouth as needed for migraine. May repeat in 2 hours if headache persists or recurs.  Maximum 2 tablets in 24 hours., Disp: 10 tablet, Rfl: 5   Topiramate  ER (TROKENDI  XR) 50 MG CP24, Take 1 capsule (50 mg total) by mouth daily., Disp: 30 capsule, Rfl: 0   tranexamic acid  (LYSTEDA ) 650 MG TABS tablet, Take 2 tablets (1,300 mg total) by mouth 3 (three) times daily., Disp: 30 tablet, Rfl: 0  Observations/Objective: Patient is well-developed, well-nourished in no acute distress.  Resting comfortably  at home.  Head is normocephalic, atraumatic.  No labored breathing.  Speech is clear and coherent with logical content.  Patient is alert and oriented at baseline.    Assessment and Plan: 1. Vestibular migraine (Primary)  Pt will continue with current medications and reach out to neurologist if no improvement. Work note given.   Follow Up  Instructions: I discussed the assessment and treatment plan with the patient. The patient was provided an opportunity to ask questions and all were answered. The patient agreed with the plan and demonstrated an understanding of the instructions.  A copy of instructions were sent to the patient via MyChart unless otherwise noted below.     The patient was advised to call back or seek an in-person evaluation if the symptoms worsen or if the condition fails to improve as anticipated.    Sue Common Ward, PA-C

## 2024-01-25 ENCOUNTER — Ambulatory Visit (INDEPENDENT_AMBULATORY_CARE_PROVIDER_SITE_OTHER): Admitting: Family Medicine

## 2024-01-25 ENCOUNTER — Encounter: Payer: Self-pay | Admitting: Family Medicine

## 2024-01-25 VITALS — BP 122/81 | HR 60 | Temp 97.8°F | Ht 67.0 in | Wt 224.0 lb

## 2024-01-25 DIAGNOSIS — R632 Polyphagia: Secondary | ICD-10-CM | POA: Diagnosis not present

## 2024-01-25 DIAGNOSIS — D500 Iron deficiency anemia secondary to blood loss (chronic): Secondary | ICD-10-CM | POA: Diagnosis not present

## 2024-01-25 DIAGNOSIS — E66812 Obesity, class 2: Secondary | ICD-10-CM

## 2024-01-25 DIAGNOSIS — G43809 Other migraine, not intractable, without status migrainosus: Secondary | ICD-10-CM | POA: Diagnosis not present

## 2024-01-25 DIAGNOSIS — F3181 Bipolar II disorder: Secondary | ICD-10-CM | POA: Diagnosis not present

## 2024-01-25 DIAGNOSIS — Z6835 Body mass index (BMI) 35.0-35.9, adult: Secondary | ICD-10-CM

## 2024-01-25 DIAGNOSIS — E6609 Other obesity due to excess calories: Secondary | ICD-10-CM

## 2024-01-25 MED ORDER — TOPIRAMATE ER 50 MG PO CAP24: 50.0000 mg | ORAL_CAPSULE | Freq: Every day | ORAL | 0 refills | Status: DC

## 2024-01-25 MED ORDER — TOPIRAMATE ER 50 MG PO CAP24: 50.0000 mg | ORAL_CAPSULE | Freq: Every day | ORAL | 1 refills | Status: DC

## 2024-01-25 MED ORDER — FERROUS GLUCONATE 324 (38 FE) MG PO TABS: 324.0000 mg | ORAL_TABLET | Freq: Every day | ORAL | 1 refills | Status: AC

## 2024-01-25 NOTE — Progress Notes (Signed)
 Office: [phone]  /  Fax: [phone]  WEIGHT SUMMARY AND BIOMETRICS  Starting Date: 03/18/22  Starting Weight: 229lb   Weight Lost Since Last Visit: 1lb   Vitals Temp: 97.8 F (36.6 C) BP: 122/81 Pulse Rate: 60 SpO2: 99 %   Body Composition  Body Fat %: 41 % Fat Mass (lbs): 91.8 lbs Muscle Mass (lbs): 125.6 lbs Total Body Water  (lbs): 84.6 lbs Visceral Fat Rating : 10   HPI  Chief Complaint: OBESITY  Sue Jackson is here to discuss her progress with her obesity treatment plan. She is on the keeping a food journal and adhering to recommended goals of 1800 calories and 90-100 protein and states she is following her eating plan approximately 85 % of the time. She states she is exercising 0 minutes 0 times per week.  Interval History:  Since last office visit she is down 1 lb This gives her a net weight loss of 10 lb in 22 mos That is a 4.3% total body weight loss She has a new psychiatrist and weaned off of gabapentin  Appetite has reduced Dizziness is improving (though she still has vertigo)- seeing Dr Festus Hubert She is seeing a new therapist She started Propranolol  for anxiety which is helping more  She has been able to reduce portion sizes (off gabapentin ) Job stress remains high She has not been working out due to working a 2nd job She plans to starting to jump rope and use her punching bag  Pharmacotherapy: Trokendi  XR 50 mg daily  PHYSICAL EXAM:  Blood pressure 122/81, pulse 60, temperature 97.8 F (36.6 C), height 5' 7 (1.702 m), weight 224 lb (101.6 kg), SpO2 99%. Body mass index is 35.08 kg/m.  General: She is overweight, cooperative, alert, well developed, and in no acute distress. PSYCH: Has normal mood, affect and thought process.   Lungs: Normal breathing effort, no conversational dyspnea.   ASSESSMENT AND PLAN  TREATMENT PLAN FOR OBESITY:  Recommended Dietary Goals  Sue Jackson is currently in the action stage of change. As such, her goal is to  continue weight management plan. She has agreed to keeping a food journal and adhering to recommended goals of 1800 calories and 100 to 130 g of protein. Reviewed plan of care and after visit summary  Behavioral Intervention  We discussed the following Behavioral Modification Strategies today: increasing lean protein intake to established goals, increasing fiber rich foods, increasing water  intake , work on meal planning and preparation, work on tracking and journaling calories using tracking application, keeping healthy foods at home, practice mindfulness eating and understand the difference between hunger signals and cravings, work on managing stress, creating time for self-care and relaxation, avoiding temptations and identifying enticing environmental cues, continue to work on implementation of reduced calorie nutritional plan, and continue to work on maintaining a reduced calorie state, getting the recommended amount of protein, incorporating whole foods, making healthy choices, staying well hydrated and practicing mindfulness when eating..  Additional resources provided today: NA  Recommended Physical Activity Goals  Sue Jackson has been advised to work up to 150 minutes of moderate intensity aerobic activity a week and strengthening exercises 2-3 times per week for cardiovascular health, weight loss maintenance and preservation of muscle mass.   She has agreed to [employer] strengthening exercises with a goal of 2-3 sessions a week  and Start aerobic activity with a goal of 150 minutes a week at moderate intensity.  Add in home workouts at least 3 days a week  Pharmacotherapy changes for the  treatment of obesity: None  ASSOCIATED CONDITIONS ADDRESSED TODAY  Polyphagia Appetite has improved also gabapentin , discontinued by psychiatry She has improvement in food impulse control on topiramate  ER 50 mg once daily with less dizziness on this dose versus a 100 mg dose Continue to work on increasing  intake of water , fiber and lean protein  -     Topiramate  ER; Take 1 capsule (50 mg total) by mouth daily.  Dispense: 30 capsule; Refill: 1  Class 2 obesity due to excess calories with body mass index (BMI) of 35.0 to 35.9 in adult, unspecified whether serious comorbidity present  Iron deficiency anemia due to chronic blood loss She has not been taking ferrous gluconate  for the past 2 to 3 months or a women's multivitamin daily.  She has a history of dysfunctional uterine bleeding with irregular menses.  She has some complaints of fatigue Restart ferrous gluconate  324 mg once daily.  Plan to recheck iron levels in the next 3 to 4 months -     Ferrous Gluconate ; Take 1 tablet (324 mg total) by mouth daily.  Dispense: 30 tablet; Refill: 1  Bipolar 2 disorder (HCC) Now managed by [facility] behavioral medicine and in counseling.  She reports improving mood on propranolol  and off gabapentin .  She has been working on improving sleep at night and practicing more mindful.  Energy level is improving.  She has good support system at home.  Stress levels with high at work.  Continue current medications and continuing counseling with Dallie Duel, LCSW  Vestibular migraine Managed by Dr. Festus Hubert, still having issues with dizziness that has affected her ability to increase exercise.  It seems to be triggered by anxiety at work.  We discussed potentially looking at work changes in the future.    She was informed of the importance of frequent follow up visits to maximize her success with intensive lifestyle modifications for her multiple health conditions.   ATTESTASTION STATEMENTS:  Reviewed by clinician on day of visit: allergies, medications, problem list, medical history, surgical history, family history, social history, and previous encounter notes pertinent to obesity diagnosis.   I have personally spent 30 minutes total time today in preparation, patient care, nutritional counseling and education,   and documentation for this visit, including the following: review of most recent clinical lab tests, prescribing medications/ refilling medications, reviewing medical assistant documentation, review and interpretation of bioimpedence results.     Micky Albee, D.O. DABFM, DABOM [facility] [address] [place], [place] [address] [phone]

## 2024-01-25 NOTE — Patient Instructions (Signed)
 Try to track daily calorie intake using the MyFitnessPal ap Do not add your exercise in Aim for 1800 cal/ day This should include 100-130 g of protein daily Avoid high sugar foods and drinks  Hydrate well with water   Aim for a good 8 hrs of sleep at night Fasting IC next visit-- you can have WATER  that morning

## 2024-01-26 ENCOUNTER — Ambulatory Visit: Admitting: Family Medicine

## 2024-01-27 ENCOUNTER — Telehealth

## 2024-02-09 ENCOUNTER — Encounter (HOSPITAL_COMMUNITY): Payer: Self-pay

## 2024-02-09 ENCOUNTER — Ambulatory Visit (HOSPITAL_COMMUNITY): Admitting: Licensed Clinical Social Worker

## 2024-02-09 DIAGNOSIS — F331 Major depressive disorder, recurrent, moderate: Secondary | ICD-10-CM

## 2024-02-09 DIAGNOSIS — F3181 Bipolar II disorder: Secondary | ICD-10-CM | POA: Diagnosis not present

## 2024-02-09 DIAGNOSIS — F411 Generalized anxiety disorder: Secondary | ICD-10-CM | POA: Diagnosis not present

## 2024-02-09 NOTE — Progress Notes (Signed)
 Virtual Visit via Video Note  I connected with Sue Jackson on 02/09/24 at  1:00 PM EDT by a video enabled telemedicine application and verified that I am speaking with the correct person using two identifiers.  Location: Patient: Stationary car Provider: home office   I discussed the limitations of evaluation and management by telemedicine and the availability of in person appointments. The patient expressed understanding and agreed to proceed.   I discussed the assessment and treatment plan with the patient. The patient was provided an opportunity to ask questions and all were answered. The patient agreed with the plan and demonstrated an understanding of the instructions.   The patient was advised to call back or seek an in-person evaluation if the symptoms worsen or if the condition fails to improve as anticipated.  I provided 40 minutes of non-face-to-face time during this encounter.  THERAPIST PROGRESS NOTE  Session Time: 1:00 PM to 1:40 PM  Participation Level: Active  Behavioral Response: CasualAlerttired from trip  Type of Therapy: Individual Therapy  Treatment Goals addressed: wants anxiety to decrease more energy and do more things that used to do such as exercise, not be too anxious around a lot of people, coping.   ProgressTowards Goals: Initial  Interventions: Solution Focused, Strength-based, and Other: supportive  Summary: Sue Jackson is a 43 y.o. female who presents with emergency trip to [place]  mom was sick. Came home yesterday, flight delayed had to go back to airport to get bag. Tried patient stayed with Mom from June 23-1. She is stable. White blood cell not going down fevers, she couldn't get up, not out of the woods but better. It was bad. WBC-30, went down up, now 14 hopefully gradually go down. Liquid in lung because of pneumonia. Peeing it out. This is not a trip not expecting to do.  Wanted a shorter session but willing to finish  paperwork.  We reviewed depression questionnaire and patient noted no significant depression the same time thinking about harming herself in the past month but no plan and no intention of acting on the plan.  She relates she had a suicide attempt before reminds herself of it how bad she felt does not want to do that to her self again.  Did agree to safety plan of being willing to call 911 go to local emergency to de-escalate symptoms if needed.  We looked at nutrition questionnaire patient talked about emotionally eats working on weight issues and nutritionist noticed that something for us  to look at.  She also said anxious when medications were not right getting her depressed and isolating but medications adjusted.  Patient gave consent to complete treatment plan. Note: It appears past treatment plan is attached to do new one.  It automatically attached so writing a note and progress note to note nonintentional a new treatment plan is supposed to be separate although patient is working on similar issue of anxiety so strategies apply to new plan as well..   Suicidal/Homicidal: No  Plan: Return again in 4 weeks.2.  Start to work on anxiety look at CBT for anxiety  Diagnosis: Generalized anxiety disorder, bipolar 2, major depressive disorder recurrent moderate  Collaboration of Care: Other none needed  Patient/Guardian was advised Release of Information must be obtained prior to any record release in order to collaborate their care with an outside provider. Patient/Guardian was advised if they have not already done so to contact the registration department to sign all necessary forms in order for us   to release information regarding their care.   Consent: Patient/Guardian gives verbal consent for treatment and assignment of benefits for services provided during this visit. Patient/Guardian expressed understanding and agreed to proceed.   Ronal Sink, LCSW 02/09/2024

## 2024-02-18 ENCOUNTER — Other Ambulatory Visit: Payer: Self-pay | Admitting: Family Medicine

## 2024-02-18 DIAGNOSIS — D500 Iron deficiency anemia secondary to blood loss (chronic): Secondary | ICD-10-CM

## 2024-02-27 ENCOUNTER — Telehealth: Admitting: Physician Assistant

## 2024-02-27 DIAGNOSIS — M5441 Lumbago with sciatica, right side: Secondary | ICD-10-CM

## 2024-02-27 DIAGNOSIS — R42 Dizziness and giddiness: Secondary | ICD-10-CM

## 2024-02-27 MED ORDER — CYCLOBENZAPRINE HCL 5 MG PO TABS
5.0000 mg | ORAL_TABLET | Freq: Three times a day (TID) | ORAL | 0 refills | Status: DC | PRN
Start: 2024-02-27 — End: 2024-03-31

## 2024-02-27 MED ORDER — MECLIZINE HCL 25 MG PO TABS: 25.0000 mg | ORAL_TABLET | Freq: Three times a day (TID) | ORAL | 0 refills | Status: AC | PRN

## 2024-02-27 NOTE — Progress Notes (Signed)
 Virtual Visit Consent   Sue Jackson, you are scheduled for a virtual visit with a [facility] provider today. Just as with appointments in the office, your consent must be obtained to participate. Your consent will be active for this visit and any virtual visit you may have with one of our providers in the next 365 days. If you have a MyChart account, a copy of this consent can be sent to you electronically.  As this is a virtual visit, video technology does not allow for your provider to perform a traditional examination. This may limit your provider's ability to fully assess your condition. If your provider identifies any concerns that need to be evaluated in person or the need to arrange testing (such as labs, EKG, etc.), we will make arrangements to do so. Although advances in technology are sophisticated, we cannot ensure that it will always work on either your end or our end. If the connection with a video visit is poor, the visit may have to be switched to a telephone visit. With either a video or telephone visit, we are not always able to ensure that we have a secure connection.  By engaging in this virtual visit, you consent to the provision of healthcare and authorize for your insurance to be billed (if applicable) for the services provided during this visit. Depending on your insurance coverage, you may receive a charge related to this service.  I need to obtain your verbal consent now. Are you willing to proceed with your visit today? Sue Jackson has provided verbal consent on 02/27/2024 for a virtual visit (video or telephone). [name], PA-C  Date: 02/27/2024 3:33 PM   Virtual Visit via Video Note   I, [name], connected with  Sue Jackson  ([id number], June 20, 1981) on 02/27/24 at  3:30 PM EDT by a video-enabled telemedicine application and verified that I am speaking with the correct person using two  identifiers.  Location: Patient: Virtual Visit Location Patient: Home Provider: Virtual Visit Location Provider: Home Office   I discussed the limitations of evaluation and management by telemedicine and the availability of in person appointments. The patient expressed understanding and agreed to proceed.    History of Present Illness: Sue Jackson is a 43 y.o. who identifies as a female who was assigned female at birth, and is being seen today for vertigo and back pain.  Vertigo is a chronic issue associated with Migraines. She is on Emgality  to prevent, but reports the heat is triggering. She has taking her Sumatriptan , but feels she may be out of her meclizine  she also uses. She has been resting in a dark room. She is in need of a work note.   Back Pain This is a recurrent problem. The current episode started yesterday (Recurrent issue that flares intermittently after heavy lifting at work, she works for [employer]). The problem has been waxing and waning since onset. The pain is present in the gluteal and lumbar spine. The quality of the pain is described as aching and cramping. The pain radiates to the right thigh. The pain is moderate. The pain is The same all the time. The symptoms are aggravated by lying down. Stiffness is present All day. Pertinent negatives include no bladder incontinence, bowel incontinence, dysuria, fever, numbness, paresis, paresthesias, perianal numbness or weakness. Risk factors include sedentary lifestyle. She has tried bed rest for the symptoms. The treatment provided mild relief.    Problems:  Patient Active Problem List  Diagnosis Date Noted   GAD (generalized anxiety disorder) 11/16/2023   Menstrual bleeding problem 10/19/2023   Hepatic steatosis 08/31/2023   Excessive thirst 08/17/2023   Iron deficiency anemia due to chronic blood loss 05/18/2023   Generalized obesity with starting BMI 35 09/22/2022   Headache, unspecified headache type 08/14/2022    Vestibular migraine 08/13/2022   RUQ abdominal pain 08/12/2022   Polyphagia 07/29/2022   Postoperative state 07/06/2022   Fibroids 05/19/2022   Uterine leiomyoma 04/20/2022   Vitamin D  deficiency 04/01/2022   Insulin  resistance 04/01/2022   Depression 04/01/2022   Malignant neoplasm of right female breast, unspecified estrogen receptor status, unspecified site of breast (HCC) 08/13/2021   Genetic testing 08/20/2020   Family history of breast cancer    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   Bipolar 2 disorder (HCC) 11/16/2019   Prediabetes 08/15/2018   Anxiety 02/06/2018   Low back pain radiating to right lower extremity 07/06/2017   Upper back pain, chronic 07/06/2017   Hypertension, essential, benign 03/11/2017    Allergies: No Known Allergies Medications:  Current Outpatient Medications:    albuterol  (VENTOLIN  HFA) 108 (90 Base) MCG/ACT inhaler, Inhale 1-2 puffs into the lungs every 6 (six) hours as needed., Disp: 8 g, Rfl: 0   amLODipine -benazepril  (LOTREL) 5-10 MG capsule, TAKE 1 CAPSULE BY MOUTH EVERY DAY, Disp: 90 capsule, Rfl: 2   cetirizine  (ZYRTEC  ALLERGY) 10 MG tablet, Take 1 tablet (10 mg total) by mouth daily., Disp: 90 tablet, Rfl: 1   cyclobenzaprine  (FLEXERIL ) 5 MG tablet, Take 1 tablet (5 mg total) by mouth 3 (three) times daily as needed for muscle spasms., Disp: 21 tablet, Rfl: 0   ferrous gluconate  (FERGON) 324 MG tablet, Take 1 tablet (324 mg total) by mouth daily., Disp: 30 tablet, Rfl: 1   fluticasone  (FLONASE ) 50 MCG/ACT nasal spray, Place 2 sprays into both nostrils daily., Disp: 16 g, Rfl: 0   Galcanezumab -gnlm (EMGALITY ) 120 MG/ML SOAJ, INJECT 120 MG INTO THE SKIN EVERY 28 DAYS., Disp: 1 mL, Rfl: 5   hydrOXYzine  (ATARAX ) 10 MG tablet, TAKE 1 TABLET BY MOUTH THREE TIMES A DAY AS NEEDED, Disp: 270 tablet, Rfl: 2   ibuprofen  (ADVIL ) 600 MG tablet, Take 1 tablet (600 mg total) by mouth every 8 (eight) hours as needed., Disp: 30 tablet,  Rfl: 0   ipratropium (ATROVENT ) 0.03 % nasal spray, Place 2 sprays into both nostrils every 12 (twelve) hours., Disp: 30 mL, Rfl: 0   loperamide  (IMODIUM  A-D) 2 MG tablet, Take 1 tablet (2 mg total) by mouth 4 (four) times daily as needed for diarrhea or loose stools., Disp: 30 tablet, Rfl: 0   meclizine  (ANTIVERT ) 25 MG tablet, Take 1 tablet (25 mg total) by mouth 3 (three) times daily as needed for dizziness., Disp: 60 tablet, Rfl: 0   polyethylene glycol powder (GLYCOLAX /MIRALAX ) 17 GM/SCOOP powder, Take 17 g by mouth 2 (two) times daily as needed., Disp: 3350 g, Rfl: 1   propranolol  (INDERAL ) 20 MG tablet, Take 20 mg by mouth 2 (two) times daily as needed., Disp: , Rfl:    SUMAtriptan  (IMITREX ) 50 MG tablet, Take 1 tablet (50 mg total) by mouth as needed for migraine. May repeat in 2 hours if headache persists or recurs.  Maximum 2 tablets in 24 hours., Disp: 10 tablet, Rfl: 5   Topiramate  ER (TROKENDI  XR) 50 MG CP24, Take 1 capsule (50 mg total) by mouth daily., Disp: 30 capsule, Rfl: 1   tranexamic acid  (  LYSTEDA ) 650 MG TABS tablet, Take 2 tablets (1,300 mg total) by mouth 3 (three) times daily., Disp: 30 tablet, Rfl: 0  Observations/Objective: Patient is well-developed, well-nourished in no acute distress.  Resting comfortably at home.  Head is normocephalic, atraumatic.  No labored breathing.  Speech is clear and coherent with logical content.  Patient is alert and oriented at baseline.    Assessment and Plan: 1. Vertigo - meclizine  (ANTIVERT ) 25 MG tablet; Take 1 tablet (25 mg total) by mouth 3 (three) times daily as needed for dizziness.  Dispense: 60 tablet; Refill: 0  2. Acute right-sided low back pain with right-sided sciatica (Primary) - cyclobenzaprine  (FLEXERIL ) 5 MG tablet; Take 1 tablet (5 mg total) by mouth 3 (three) times daily as needed for muscle spasms.  Dispense: 21 tablet; Refill: 0  - Meclizine  refilled - Continue other migraine medications as prescribed - Can  take Ibuprofen  for back pain - Flexeril  refilled for spasms - Back exercises provided in AVS - Push fluids, electrolyte beverages in case dehydration is triggering for both - Warm compresses for back - Cool compresses for neck or head with migraine - Rest - Work note provided - Seek in person evaluation if worsening  Follow Up Instructions: I discussed the assessment and treatment plan with the patient. The patient was provided an opportunity to ask questions and all were answered. The patient agreed with the plan and demonstrated an understanding of the instructions.  A copy of instructions were sent to the patient via MyChart unless otherwise noted below.    The patient was advised to call back or seek an in-person evaluation if the symptoms worsen or if the condition fails to improve as anticipated.    [name], PA-C

## 2024-02-27 NOTE — Patient Instructions (Signed)
 Sue Jackson, thank you for joining Sue CHRISTELLA Dickinson, PA-C for today's virtual visit.  While this provider is not your primary care provider (PCP), if your PCP is located in our provider database this encounter information will be shared with them immediately following your visit.   A [facility] MyChart account gives you access to today's visit and all your visits, tests, and labs performed at [facility]  click here if you don't have a  MyChart account or go to [email or web address]  Consent: (Patient) Sue Jackson provided verbal consent for this virtual visit at the beginning of the encounter.  Current Medications:  Current Outpatient Medications:    albuterol  (VENTOLIN  HFA) 108 (90 Base) MCG/ACT inhaler, Inhale 1-2 puffs into the lungs every 6 (six) hours as needed., Disp: 8 g, Rfl: 0   amLODipine -benazepril  (LOTREL) 5-10 MG capsule, TAKE 1 CAPSULE BY MOUTH EVERY DAY, Disp: 90 capsule, Rfl: 2   cetirizine  (ZYRTEC  ALLERGY) 10 MG tablet, Take 1 tablet (10 mg total) by mouth daily., Disp: 90 tablet, Rfl: 1   cyclobenzaprine  (FLEXERIL ) 5 MG tablet, Take 1 tablet (5 mg total) by mouth 3 (three) times daily as needed for muscle spasms., Disp: 21 tablet, Rfl: 0   ferrous gluconate  (FERGON) 324 MG tablet, Take 1 tablet (324 mg total) by mouth daily., Disp: 30 tablet, Rfl: 1   fluticasone  (FLONASE ) 50 MCG/ACT nasal spray, Place 2 sprays into both nostrils daily., Disp: 16 g, Rfl: 0   Galcanezumab -gnlm (EMGALITY ) 120 MG/ML SOAJ, INJECT 120 MG INTO THE SKIN EVERY 28 DAYS., Disp: 1 mL, Rfl: 5   hydrOXYzine  (ATARAX ) 10 MG tablet, TAKE 1 TABLET BY MOUTH THREE TIMES A DAY AS NEEDED, Disp: 270 tablet, Rfl: 2   ibuprofen  (ADVIL ) 600 MG tablet, Take 1 tablet (600 mg total) by mouth every 8 (eight) hours as needed., Disp: 30 tablet, Rfl: 0   ipratropium (ATROVENT ) 0.03 % nasal spray, Place 2 sprays into both nostrils every 12 (twelve) hours., Disp: 30 mL,  Rfl: 0   loperamide  (IMODIUM  A-D) 2 MG tablet, Take 1 tablet (2 mg total) by mouth 4 (four) times daily as needed for diarrhea or loose stools., Disp: 30 tablet, Rfl: 0   meclizine  (ANTIVERT ) 25 MG tablet, Take 1 tablet (25 mg total) by mouth 3 (three) times daily as needed for dizziness., Disp: 60 tablet, Rfl: 0   polyethylene glycol powder (GLYCOLAX /MIRALAX ) 17 GM/SCOOP powder, Take 17 g by mouth 2 (two) times daily as needed., Disp: 3350 g, Rfl: 1   propranolol  (INDERAL ) 20 MG tablet, Take 20 mg by mouth 2 (two) times daily as needed., Disp: , Rfl:    SUMAtriptan  (IMITREX ) 50 MG tablet, Take 1 tablet (50 mg total) by mouth as needed for migraine. May repeat in 2 hours if headache persists or recurs.  Maximum 2 tablets in 24 hours., Disp: 10 tablet, Rfl: 5   Topiramate  ER (TROKENDI  XR) 50 MG CP24, Take 1 capsule (50 mg total) by mouth daily., Disp: 30 capsule, Rfl: 1   tranexamic acid  (LYSTEDA ) 650 MG TABS tablet, Take 2 tablets (1,300 mg total) by mouth 3 (three) times daily., Disp: 30 tablet, Rfl: 0   Medications ordered in this encounter:  Meds ordered this encounter  Medications   meclizine  (ANTIVERT ) 25 MG tablet    Sig: Take 1 tablet (25 mg total) by mouth 3 (three) times daily as needed for dizziness.    Dispense:  60 tablet    Refill:  0  Supervising Provider:   LAMPTEY, PHILIP O [[id number]]   cyclobenzaprine  (FLEXERIL ) 5 MG tablet    Sig: Take 1 tablet (5 mg total) by mouth 3 (three) times daily as needed for muscle spasms.    Dispense:  21 tablet    Refill:  0    Supervising Provider:   BLAISE ALEENE KIDD [[id number]]     *If you need refills on other medications prior to your next appointment, please contact your pharmacy*  Follow-Up: Call back or seek an in-person evaluation if the symptoms worsen or if the condition fails to improve as anticipated.  [facility] [phone]  Other Instructions Back Exercises The following exercises strengthen the muscles  that help to support the trunk (torso) and back. They also help to keep the lower back flexible. Doing these exercises can help to prevent or lessen existing low back pain. If you have back pain or discomfort, try doing these exercises 2-3 times each day or as told by your health care provider. As your pain improves, do them once each day, but increase the number of times that you repeat the steps for each exercise (do more repetitions). To prevent the recurrence of back pain, continue to do these exercises once each day or as told by your health care provider. Do exercises exactly as told by your health care provider and adjust them as directed. It is normal to feel mild stretching, pulling, tightness, or discomfort as you do these exercises, but you should stop right away if you feel sudden pain or your pain gets worse. Exercises Single knee to chest Repeat these steps 3-5 times for each leg: Lie on your back on a firm bed or the floor with your legs extended. Bring one knee to your chest. Your other leg should stay extended and in contact with the floor. Hold your knee in place by grabbing your knee or thigh with both hands and hold. Pull on your knee until you feel a gentle stretch in your lower back or buttocks. Hold the stretch for 10-30 seconds. Slowly release and straighten your leg.  Pelvic tilt Repeat these steps 5-10 times: Lie on your back on a firm bed or the floor with your legs extended. Bend your knees so they are pointing toward the ceiling and your feet are flat on the floor. Tighten your lower abdominal muscles to press your lower back against the floor. This motion will tilt your pelvis so your tailbone points up toward the ceiling instead of pointing to your feet or the floor. With gentle tension and even breathing, hold this position for 5-10 seconds.  Cat-cow Repeat these steps until your lower back becomes more flexible: Get into a hands-and-knees position on a firm  bed or the floor. Keep your hands under your shoulders, and keep your knees under your hips. You may place padding under your knees for comfort. Let your head hang down toward your chest. Contract your abdominal muscles and point your tailbone toward the floor so your lower back becomes rounded like the back of a cat. Hold this position for 5 seconds. Slowly lift your head, let your abdominal muscles relax, and point your tailbone up toward the ceiling so your back forms a sagging arch like the back of a cow. Hold this position for 5 seconds.  Press-ups Repeat these steps 5-10 times: Lie on your abdomen (face-down) on a firm bed or the floor. Place your palms near your head, about shoulder-width apart. Keeping your  back as relaxed as possible and keeping your hips on the floor, slowly straighten your arms to raise the top half of your body and lift your shoulders. Do not use your back muscles to raise your upper torso. You may adjust the placement of your hands to make yourself more comfortable. Hold this position for 5 seconds while you keep your back relaxed. Slowly return to lying flat on the floor.  Bridges Repeat these steps 10 times: Lie on your back on a firm bed or the floor. Bend your knees so they are pointing toward the ceiling and your feet are flat on the floor. Your arms should be flat at your sides, next to your body. Tighten your buttocks muscles and lift your buttocks off the floor until your waist is at almost the same height as your knees. You should feel the muscles working in your buttocks and the back of your thighs. If you do not feel these muscles, slide your feet 1-2 inches (2.5-5 cm) farther away from your buttocks. Hold this position for 3-5 seconds. Slowly lower your hips to the starting position, and allow your buttocks muscles to relax completely. If this exercise is too easy, try doing it with your arms crossed over your chest. Abdominal crunches Repeat these  steps 5-10 times: Lie on your back on a firm bed or the floor with your legs extended. Bend your knees so they are pointing toward the ceiling and your feet are flat on the floor. Cross your arms over your chest. Tip your chin slightly toward your chest without bending your neck. Tighten your abdominal muscles and slowly raise your torso high enough to lift your shoulder blades a tiny bit off the floor. Avoid raising your torso higher than that because it can put too much stress on your lower back and does not help to strengthen your abdominal muscles. Slowly return to your starting position.  Back lifts Repeat these steps 5-10 times: Lie on your abdomen (face-down) with your arms at your sides, and rest your forehead on the floor. Tighten the muscles in your legs and your buttocks. Slowly lift your chest off the floor while you keep your hips pressed to the floor. Keep the back of your head in line with the curve in your back. Your eyes should be looking at the floor. Hold this position for 3-5 seconds. Slowly return to your starting position.  Contact a health care provider if: Your back pain or discomfort gets much worse when you do an exercise. Your worsening back pain or discomfort does not lessen within 2 hours after you exercise. If you have any of these problems, stop doing these exercises right away. Do not do them again unless your health care provider says that you can. Get help right away if: You develop sudden, severe back pain. If this happens, stop doing the exercises right away. Do not do them again unless your health care provider says that you can. This information is not intended to replace advice given to you by your health care provider. Make sure you discuss any questions you have with your health care provider. Document Revised: 08/29/2022 Document Reviewed: 10/08/2020 Elsevier Patient Education  2024 Elsevier Inc[employer]   If you have been instructed to have an in-person  evaluation today at a local Urgent Care facility, please use the link below. It will take you to a list of all of our available [facility], including address, phone number and hours of operation.  Please do not delay care.  [facility]  If you or a family member do not have a primary care provider, use the link below to schedule a visit and establish care. When you choose a [facility] primary care physician or advanced practice provider, you gain a long-term partner in health. Find a Primary Care Provider  Learn more about [facility] in-office and virtual care options: [facility] - Get Care Now

## 2024-03-08 ENCOUNTER — Ambulatory Visit (INDEPENDENT_AMBULATORY_CARE_PROVIDER_SITE_OTHER): Admitting: Licensed Clinical Social Worker

## 2024-03-08 DIAGNOSIS — F331 Major depressive disorder, recurrent, moderate: Secondary | ICD-10-CM

## 2024-03-08 DIAGNOSIS — F3181 Bipolar II disorder: Secondary | ICD-10-CM | POA: Diagnosis not present

## 2024-03-08 DIAGNOSIS — F411 Generalized anxiety disorder: Secondary | ICD-10-CM

## 2024-03-08 NOTE — Progress Notes (Signed)
 Virtual Visit via Video Note  I connected with Sue Jackson on 03/08/24 at  1:00 PM EDT by a video enabled telemedicine application and verified that I am speaking with the correct person using two identifiers.  Location: Patient: home Provider: home office   I discussed the limitations of evaluation and management by telemedicine and the availability of in person appointments. The patient expressed understanding and agreed to proceed.  I discussed the assessment and treatment plan with the patient. The patient was provided an opportunity to ask questions and all were answered. The patient agreed with the plan and demonstrated an understanding of the instructions.   The patient was advised to call back or seek an in-person evaluation if the symptoms worsen or if the condition fails to improve as anticipated.  I provided 20 minutes of non-face-to-face time during this encounter.  THERAPIST PROGRESS NOTE  Session Time: 1:00 PM to 1:20 PM  Participation Level: Active  Behavioral Response: CasualAlertappropriate  Type of Therapy: Individual Therapy  Treatment Goals addressed: wants anxiety to decrease more energy and do more things that used to do such as exercise, not be too anxious around a lot of people, coping.   ProgressTowards Goals: Progressing-shorter session patient had something needed taking care of checked in with therapist continue to use therapy to help with coping  Interventions: Solution Focused, Strength-based, Supportive, and Other: coping  Summary: Sue Jackson is a 43 y.o. female who presents with wanted to pick up this call something came up really need to do, has to handle. Didn't want to not have the call.  Wishes the day was longer day goes by does things done wants to stay in therapy about once a month so just checked in with therapist and make an appointment.  Therapist asked about her mom Mom stable a tiny knot in her lung she is stable  she is weak, in a wheelchair, white blood cell came down, sent home, went to hospital after sent home now back home because of fever affected her heart coronary heart disease mild fevers getting because white blood cell high. Therapist offered emotional support, active listening and validation of patient's emotional experience as appropriate during session.    Suicidal/Homicidal: No  Plan: Return again in 6-7 weeks.2.  Look at worksheet on anxiety note patient emotionally eats can look at that check in with current stressors  Diagnosis: Generalized anxiety disorder, bipolar 2, major depressive disorder recurrent moderate   Collaboration of Care: Other none needed  Patient/Guardian was advised Release of Information must be obtained prior to any record release in order to collaborate their care with an outside provider. Patient/Guardian was advised if they have not already done so to contact the registration department to sign all necessary forms in order for us  to release information regarding their care.   Consent: Patient/Guardian gives verbal consent for treatment and assignment of benefits for services provided during this visit. Patient/Guardian expressed understanding and agreed to proceed.   Ronal Sink, LCSW 03/08/2024

## 2024-03-14 ENCOUNTER — Telehealth: Admitting: Family Medicine

## 2024-03-14 ENCOUNTER — Encounter: Payer: Self-pay | Admitting: Family Medicine

## 2024-03-14 VITALS — Ht 67.0 in | Wt 231.0 lb

## 2024-03-14 DIAGNOSIS — E66812 Obesity, class 2: Secondary | ICD-10-CM | POA: Diagnosis not present

## 2024-03-14 DIAGNOSIS — R7303 Prediabetes: Secondary | ICD-10-CM | POA: Diagnosis not present

## 2024-03-14 DIAGNOSIS — Z6836 Body mass index (BMI) 36.0-36.9, adult: Secondary | ICD-10-CM

## 2024-03-14 DIAGNOSIS — F3181 Bipolar II disorder: Secondary | ICD-10-CM | POA: Diagnosis not present

## 2024-03-14 DIAGNOSIS — D500 Iron deficiency anemia secondary to blood loss (chronic): Secondary | ICD-10-CM | POA: Diagnosis not present

## 2024-03-14 NOTE — Patient Instructions (Signed)
 Protein SMOOTHIE in the [employer] greek yogurt (with < 8 g of sugar) Splash of milk Frozen fruit (berries, 1/2 banana, cherries, etc) To add more fiber, you can add a tablespoon of chia seeds and/ or a handful of spinach  Track calories with a goal of 1800 per day This should include ~120 g of protein daily Hydrate well with water  Work on self care, stress reduction and mindful eating

## 2024-03-14 NOTE — Progress Notes (Signed)
 Office: [phone]  /  Fax: [phone]  WEIGHT SUMMARY AND BIOMETRICS  No data recorded No data recorded  No data recorded  No data recorded No data recorded  I connected with  Sue Jackson on 03/14/24 by a video and audio enabled telemedicine application and verified that I am speaking with the correct person using two identifiers.  Patient Location: Other:  pt sitting in care outside of work, alone  Provider Location: Office/Clinic  I discussed the limitations of evaluation and management by telemedicine. The patient expressed understanding and agreed to proceed.  HPI  Chief Complaint: OBESITY  Sue Jackson is here to discuss her progress with her obesity treatment plan. She is on the keeping a food journal and adhering to recommended goals of 1800 calories and 120 g of  protein and practicing portion control and making smarter food choices, such as increasing vegetables and decreasing simple carbohydrates and states she is following her eating plan approximately 0 % of the time. She states she is exercising 0 minutes 0 times per week.   Interval History:  Since last office visit she is up 7 lb She is seen via [facility] today due to URI She had to travel to [facility] last month as her mom was in the hospital Her mom lives w/ her older sister and they don't get along well She has been in therapy for her mood Job stress improving -- changing hrs to 2p-10:30 p Has not been exercising, lacking sleep Support at home with wife is good Plans to buy new sneakers Admits to poor diet, lack of tracking, poor food choices, overeating, comfort foods Net weight gain of 2 lb in 2 years  Pharmacotherapy: Trokendi  XR 50 mg daily  PHYSICAL EXAM:  Height 5' 7 (1.702 m), weight 231 lb (104.8 kg). Body mass index is 36.18 kg/m.  General: She is overweight, cooperative, alert, well developed, and in no acute distress. PSYCH: Has normal mood, affect and thought process.  Slightly  tearful Lungs: Normal breathing effort, no conversational dyspnea.  ASSESSMENT AND PLAN  TREATMENT PLAN FOR OBESITY:  Recommended Dietary Goals  Sue Jackson is currently in the action stage of change. As such, her goal is to continue weight management plan. She has agreed to keeping a food journal and adhering to recommended goals of 1800 calories and 120 g of  protein.  Behavioral Intervention  We discussed the following Behavioral Modification Strategies today: increasing lean protein intake to established goals, decreasing simple carbohydrates , increasing fiber rich foods, increasing water  intake , work on meal planning and preparation, work on Counselling psychologist calories using tracking application, keeping healthy foods at home, work on managing stress, creating time for self-care and relaxation, continue to practice mindfulness when eating, and continue to work on maintaining a reduced calorie state, getting the recommended amount of protein, incorporating whole foods, making healthy choices, staying well hydrated and practicing mindfulness when eating..  Additional resources provided today: NA  Recommended Physical Activity Goals  [name] has been advised to work up to 150 minutes of moderate intensity aerobic activity a week and strengthening exercises 2-3 times per week for cardiovascular health, weight loss maintenance and preservation of muscle mass.   She has agreed to [employer] strengthening exercises with a goal of 2-3 sessions a week  and Start aerobic activity with a goal of 150 minutes a week at moderate intensity.  Resume home workouts at least 2 days/ wk  Pharmacotherapy changes for the treatment of obesity: none  ASSOCIATED CONDITIONS ADDRESSED TODAY  Prediabetes Lab Results  Component Value Date   HGBA1C 6.1 (H) 11/16/2023   Hx of metformin  and Rybelsus  intolerance Has cut back on sugar intake but tends to over eat refined carbohydrates Exercise has been  lacking Repeat A1c and fasting insulin  next visit  Class 2 obesity due to excess calories with body mass index (BMI) of 36.0 to 36.9 in adult, unspecified whether serious comorbidity present Failing to see improvements in 2 years of treatment Had SE from Qsymia , metformin  and Rybelsus  Lacks GLP-1 RA coverage Barriers to progress include mood, work stress, work hours Food impulse control better on Trokendi  XR 50 mg daily  Bipolar 2 disorder (HCC) Started counseling Stress levels high w/ her family  Continue counseling and work on improving sleep, nutrition, physical activity and self care  Iron deficiency anemia due to chronic blood loss Lab Results  Component Value Date   IRON 54 11/16/2023   TIBC 357 11/16/2023   FERRITIN 22 11/16/2023   Taking ferrous gluconate  324 mg daily Hx of DUB Energy level improving Continue RX iron and repeat labs next visit     She was informed of the importance of frequent follow up visits to maximize her success with intensive lifestyle modifications for her multiple health conditions.   ATTESTASTION STATEMENTS:  Reviewed by clinician on day of visit: allergies, medications, problem list, medical history, surgical history, family history, social history, and previous encounter notes pertinent to obesity diagnosis.   I have personally spent 30 minutes total time today in preparation, patient care, nutritional counseling and education,  and documentation for this visit, including the following: review of most recent clinical lab tests, prescribing medications/ refilling medications, reviewing medical assistant documentation, review and interpretation of bioimpedence results.     Sue Jackson, D.O. DABFM, DABOM [facility] [address] [place], [place] [address] [phone]

## 2024-03-31 ENCOUNTER — Telehealth: Admitting: Family Medicine

## 2024-03-31 DIAGNOSIS — M5441 Lumbago with sciatica, right side: Secondary | ICD-10-CM | POA: Diagnosis not present

## 2024-03-31 MED ORDER — CYCLOBENZAPRINE HCL 5 MG PO TABS: 5.0000 mg | ORAL_TABLET | Freq: Three times a day (TID) | ORAL | 0 refills | Status: AC | PRN

## 2024-03-31 NOTE — Patient Instructions (Signed)
 Lumbosacral Strain A lumbosacral strain is an injury that causes pain in the lower back (lumbosacral spine). This injury usually happens from overstretching the muscles or ligaments along the spine. Ligaments are cord-like tissues that connect bones to each other. A strain can affect one or more muscles or ligaments. What are the causes? This condition may be caused by: A hard, direct hit to the back. Overstretching the lower back muscles. This may result from: A fall. Lifting something heavy. Repeated movements such as bending or crouching. What increases the risk? The following factors make you more likely to have a lumbosacral strain: Taking part in sports or activities that involve: A sudden twist of the back. Pushing or pulling motions. Being overweight or obese. Having poor strength and flexibility, especially tight hamstrings or weak muscles in the back or abdomen. Having too much of a curve in the lower back. Having a pelvis that is tilted forward. What are the signs or symptoms? The main symptom of this condition is pain in the lower back, at the site of the strain. Pain may also be felt down one or both legs. How is this diagnosed? This condition is diagnosed based on: Your symptoms and medical history. A physical exam. During the exam: Your health care provider may push on certain areas of your back to find the source of your pain. You may be asked to bend forward, backward, and side to side to check your pain and range of motion. You may also have imaging tests, such as X-rays and an MRI. How is this treated? This condition may be treated by: Applying heat and cold to the affected area. Taking medicines for pain and to relax your muscles. Taking NSAIDs, such as ibuprofen, to help reduce swelling and discomfort. Doing stretching and strengthening exercises for your lower back. Symptoms usually improve within several weeks of treatment. But recovery time varies. When your  symptoms improve, gradually return to your normal routine as soon as possible. This will help reduce pain, avoid stiffness, and keep muscle strength. Follow these instructions at home: Medicines Take over-the-counter and prescription medicines only as told by your health care provider. Ask your health care provider if the medicine prescribed to you: Requires you to avoid driving or using machinery. Can cause constipation. You may need to take these actions to prevent or treat constipation: Drink enough fluid to keep your urine pale yellow. Take over-the-counter or prescription medicines. Eat foods that are high in fiber, such as beans, whole grains, and fresh fruits and vegetables. Limit foods that are high in fat and processed sugars, such as fried or sweet foods. Managing pain, stiffness, and swelling     If told, put ice on the injured area. Put ice in a plastic bag. Place a towel between your skin and the bag. Leave the ice on for 20 minutes, 2-3 times a day. If told, apply heat to the affected area as often as told by your health care provider. Use the heat source that your health care provider recommends, such as a moist heat pack or a heating pad. Place a towel between your skin and the heat source. Leave the heat on for 20-30 minutes. If your skin turns bright red, remove the heat or ice right away to prevent skin damage. The risk of damage is higher if you cannot feel pain, heat, or cold. Activity Rest as told by your health care provider. Do not stay in bed. Staying in bed for more than 1-2 days  can delay your recovery. Return to your normal activities as told by your health care provider. Ask your health care provider what activities are safe for you. Avoid activities that take a lot of energy for as long as told by your health care provider. Do exercises as told by your health care provider. This includes stretching and strengthening exercises. General instructions      Use good posture when sitting and standing. Avoid leaning forward when you sit, and avoid hunching over when you stand. Do not use any products that contain nicotine or tobacco. These products include cigarettes, chewing tobacco, and vaping devices, such as e-cigarettes. If you need help quitting, ask your health care provider. How is this prevented?  Warm up properly before physical activity, and cool down and stretch after being active. Use correct form when playing sports. Bend your knees and use correct posture when lifting heavy objects. Maintain a healthy weight. Sleep on a mattress with medium firmness to support your back. Do at least 150 minutes of moderate-intensity exercise each week, such as brisk walking or water aerobics. Try a form of exercise that takes stress off your back, such as swimming or stationary cycling. Maintain physical fitness, including: Strength. Flexibility. Contact a health care provider if: Your back pain does not improve after several weeks of treatment. Your symptoms get worse. You have a fever. Get help right away if: Your back pain is severe. You cannot stand or walk. You feel nauseous or you vomit. You develop any of the following: Trouble controlling when you urinate or when you have a bowel movement. Pain in your legs. Your feet or legs get very cold, turn pale, or look blue. Weakness in your buttocks or legs. This information is not intended to replace advice given to you by your health care provider. Make sure you discuss any questions you have with your health care provider. Document Revised: 11/29/2022 Document Reviewed: 02/16/2022 Elsevier Patient Education  2024 [employer].

## 2024-03-31 NOTE — Progress Notes (Signed)
 Virtual Visit Consent   Sue Jackson, you are scheduled for a virtual visit with a [facility] provider today. Just as with appointments in the office, your consent must be obtained to participate. Your consent will be active for this visit and any virtual visit you may have with one of our providers in the next 365 days. If you have a MyChart account, a copy of this consent can be sent to you electronically.  As this is a virtual visit, video technology does not allow for your provider to perform a traditional examination. This may limit your provider's ability to fully assess your condition. If your provider identifies any concerns that need to be evaluated in person or the need to arrange testing (such as labs, EKG, etc.), we will make arrangements to do so. Although advances in technology are sophisticated, we cannot ensure that it will always work on either your end or our end. If the connection with a video visit is poor, the visit may have to be switched to a telephone visit. With either a video or telephone visit, we are not always able to ensure that we have a secure connection.  By engaging in this virtual visit, you consent to the provision of healthcare and authorize for your insurance to be billed (if applicable) for the services provided during this visit. Depending on your insurance coverage, you may receive a charge related to this service.  I need to obtain your verbal consent now. Are you willing to proceed with your visit today? Sue Jackson has provided verbal consent on 03/31/2024 for a virtual visit (video or telephone). [name], FNP  Date: 03/31/2024 3:18 PM   Virtual Visit via Video Note   I, [name], connected with  Sue Jackson  ([id number], 1981-03-20) on 03/31/24 at  3:15 PM EDT by a video-enabled telemedicine application and verified that I am speaking with the correct person using two identifiers.  Location: Patient: Virtual  Visit Location Patient: Home Provider: Virtual Visit Location Provider: Home Office   I discussed the limitations of evaluation and management by telemedicine and the availability of in person appointments. The patient expressed understanding and agreed to proceed.    History of Present Illness: Sue Jackson is a 43 y.o. who identifies as a female who was assigned female at birth, and is being seen today for low back pain, history of arthritis, pain down rt hip back of leg. NKI. NSAIDS and muscle relaxants have helped in the past. Tries not to take steroids due to anxiety. She has ibuprofen  to take. She has taken flexeril  in the past   HPI: HPI  Problems:  Patient Active Problem List   Diagnosis Date Noted   GAD (generalized anxiety disorder) 11/16/2023   Menstrual bleeding problem 10/19/2023   Hepatic steatosis 08/31/2023   Excessive thirst 08/17/2023   Iron deficiency anemia due to chronic blood loss 05/18/2023   Generalized obesity with starting BMI 35 09/22/2022   Headache, unspecified headache type 08/14/2022   Vestibular migraine 08/13/2022   RUQ abdominal pain 08/12/2022   Polyphagia 07/29/2022   Postoperative state 07/06/2022   Fibroids 05/19/2022   Uterine leiomyoma 04/20/2022   Vitamin D  deficiency 04/01/2022   Insulin  resistance 04/01/2022   Depression 04/01/2022   Malignant neoplasm of right female breast, unspecified estrogen receptor status, unspecified site of breast (HCC) 08/13/2021   Genetic testing 08/20/2020   Family history of breast cancer    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   Bipolar 2 disorder (HCC) 11/16/2019   Prediabetes 08/15/2018   Anxiety 02/06/2018   Low back pain radiating to right lower extremity 07/06/2017   Upper back pain, chronic 07/06/2017   Hypertension, essential, benign 03/11/2017    Allergies: No Known Allergies Medications:  Current Outpatient Medications:    albuterol  (VENTOLIN  HFA) 108 (90  Base) MCG/ACT inhaler, Inhale 1-2 puffs into the lungs every 6 (six) hours as needed., Disp: 8 g, Rfl: 0   amLODipine -benazepril  (LOTREL) 5-10 MG capsule, TAKE 1 CAPSULE BY MOUTH EVERY DAY, Disp: 90 capsule, Rfl: 2   cetirizine  (ZYRTEC  ALLERGY) 10 MG tablet, Take 1 tablet (10 mg total) by mouth daily., Disp: 90 tablet, Rfl: 1   cyclobenzaprine  (FLEXERIL ) 5 MG tablet, Take 1 tablet (5 mg total) by mouth 3 (three) times daily as needed for muscle spasms., Disp: 21 tablet, Rfl: 0   ferrous gluconate  (FERGON) 324 MG tablet, Take 1 tablet (324 mg total) by mouth daily., Disp: 30 tablet, Rfl: 1   fluticasone  (FLONASE ) 50 MCG/ACT nasal spray, Place 2 sprays into both nostrils daily., Disp: 16 g, Rfl: 0   Galcanezumab -gnlm (EMGALITY ) 120 MG/ML SOAJ, INJECT 120 MG INTO THE SKIN EVERY 28 DAYS., Disp: 1 mL, Rfl: 5   hydrOXYzine  (ATARAX ) 10 MG tablet, TAKE 1 TABLET BY MOUTH THREE TIMES A DAY AS NEEDED, Disp: 270 tablet, Rfl: 2   ibuprofen  (ADVIL ) 600 MG tablet, Take 1 tablet (600 mg total) by mouth every 8 (eight) hours as needed., Disp: 30 tablet, Rfl: 0   ipratropium (ATROVENT ) 0.03 % nasal spray, Place 2 sprays into both nostrils every 12 (twelve) hours., Disp: 30 mL, Rfl: 0   loperamide  (IMODIUM  A-D) 2 MG tablet, Take 1 tablet (2 mg total) by mouth 4 (four) times daily as needed for diarrhea or loose stools., Disp: 30 tablet, Rfl: 0   meclizine  (ANTIVERT ) 25 MG tablet, Take 1 tablet (25 mg total) by mouth 3 (three) times daily as needed for dizziness., Disp: 60 tablet, Rfl: 0   polyethylene glycol powder (GLYCOLAX /MIRALAX ) 17 GM/SCOOP powder, Take 17 g by mouth 2 (two) times daily as needed., Disp: 3350 g, Rfl: 1   propranolol  (INDERAL ) 20 MG tablet, Take 20 mg by mouth 2 (two) times daily as needed., Disp: , Rfl:    SUMAtriptan  (IMITREX ) 50 MG tablet, Take 1 tablet (50 mg total) by mouth as needed for migraine. May repeat in 2 hours if headache persists or recurs.  Maximum 2 tablets in 24 hours., Disp: 10  tablet, Rfl: 5   Topiramate  ER (TROKENDI  XR) 50 MG CP24, Take 1 capsule (50 mg total) by mouth daily., Disp: 30 capsule, Rfl: 1   tranexamic acid  (LYSTEDA ) 650 MG TABS tablet, Take 2 tablets (1,300 mg total) by mouth 3 (three) times daily., Disp: 30 tablet, Rfl: 0  Observations/Objective: Patient is well-developed, well-nourished in no acute distress.  Resting comfortably  at home.  Head is normocephalic, atraumatic.  No labored breathing.  Speech is clear and coherent with logical content.  Patient is alert and oriented at baseline.    Assessment and Plan: 1. Acute right-sided low back pain with right-sided sciatica (Primary)    Follow Up Instructions: I discussed the assessment and treatment plan with the patient. The patient was provided an opportunity to ask questions and all were answered. The patient agreed with the plan and demonstrated an understanding of the instructions.  A copy of instructions were sent to the patient via MyChart unless otherwise noted  below.     The patient was advised to call back or seek an in-person evaluation if the symptoms worsen or if the condition fails to improve as anticipated.    Bonnetta Allbee, FNP

## 2024-04-05 ENCOUNTER — Encounter: Payer: Self-pay | Admitting: Family Medicine

## 2024-04-05 ENCOUNTER — Ambulatory Visit (INDEPENDENT_AMBULATORY_CARE_PROVIDER_SITE_OTHER): Admitting: Family Medicine

## 2024-04-05 VITALS — BP 137/77 | HR 80 | Temp 97.7°F | Ht 67.0 in | Wt 235.0 lb

## 2024-04-05 DIAGNOSIS — F3181 Bipolar II disorder: Secondary | ICD-10-CM

## 2024-04-05 DIAGNOSIS — R632 Polyphagia: Secondary | ICD-10-CM | POA: Diagnosis not present

## 2024-04-05 DIAGNOSIS — Z6836 Body mass index (BMI) 36.0-36.9, adult: Secondary | ICD-10-CM

## 2024-04-05 DIAGNOSIS — F509 Eating disorder, unspecified: Secondary | ICD-10-CM

## 2024-04-05 DIAGNOSIS — E66812 Obesity, class 2: Secondary | ICD-10-CM

## 2024-04-05 MED ORDER — TOPIRAMATE ER 50 MG PO CAP24: 50.0000 mg | ORAL_CAPSULE | Freq: Every day | ORAL | 1 refills | Status: DC

## 2024-04-05 NOTE — Progress Notes (Signed)
 Office: [phone]  /  Fax: [phone]  WEIGHT SUMMARY AND BIOMETRICS  Starting Date: 03/18/22  Starting Weight: 229lb   Weight Lost Since Last Visit: 0lb   Vitals Temp: 97.7 F (36.5 C) BP: 137/77 Pulse Rate: 80 SpO2: 100 %   Body Composition  Body Fat %: 41.9 % Fat Mass (lbs): 98.6 lbs Muscle Mass (lbs): 129.8 lbs Total Body Water  (lbs): 87 lbs Visceral Fat Rating : 11    HPI  Chief Complaint: OBESITY  Sue Jackson is here to discuss her progress with her obesity treatment plan. She is on the keeping a food journal and adhering to recommended goals of 1800 calories and 90-100 protein and states she is following her eating plan approximately 85 % of the time. She states she is exercising 20-30 minutes 2 times per week.  Interval History:  Since last office visit she is up 11 lb This gives her a net weight gain of 6 lb in 2 years of medically supervised weight management She notices a rise in emotional eating Her mom is still sick and she will have to go back to [facility] to see her She is seeing a psychiatrist and a counselor for bipolar disorder She moved her work shift from 2 pm -- 10:30 pm She is riding her bike 2 x a week and she is using free weights at home  Pharmacotherapy: Trokendi  XR 50 mg q HS  PHYSICAL EXAM:  Blood pressure 137/77, pulse 80, temperature 97.7 F (36.5 C), height 5' 7 (1.702 m), weight 235 lb (106.6 kg), SpO2 100%. Body mass index is 36.81 kg/m.  General: She is overweight, cooperative, alert, well developed, and in no acute distress. PSYCH: Has normal mood, affect and thought process.   Lungs: Normal breathing effort, no conversational dyspnea.   ASSESSMENT AND PLAN  TREATMENT PLAN FOR OBESITY:  Recommended Dietary Goals  Sue Jackson is currently in the action stage of change. As such, her goal is to continue weight management plan. She has agreed to keeping a food journal and adhering to recommended goals of 1600 calories and 100 g  of  protein and practicing portion control and making smarter food choices, such as increasing vegetables and decreasing simple carbohydrates.  Behavioral Intervention  We discussed the following Behavioral Modification Strategies today: increasing lean protein intake to established goals, increasing fiber rich foods, increasing water  intake , work on meal planning and preparation, keeping healthy foods at home, work on managing stress, creating time for self-care and relaxation, and avoiding temptations and identifying enticing environmental cues.  Additional resources provided today: NA  Recommended Physical Activity Goals  Sue Jackson has been advised to work up to 150 minutes of moderate intensity aerobic activity a week and strengthening exercises 2-3 times per week for cardiovascular health, weight loss maintenance and preservation of muscle mass.   She has agreed to Start aerobic activity with a goal of 150 minutes a week at moderate intensity.  and Increase the intensity, frequency or duration of strengthening exercises   Pharmacotherapy changes for the treatment of obesity: none  ASSOCIATED CONDITIONS ADDRESSED TODAY  Eating disorder, unspecified type She is struggling more lately with emotional eating  She is seeing a psychiatrist and a therapist and has a good support system at home This has been a barrier to her progress Consider pausing her active plan for weight loss until this is addressed  Polyphagia -     Topiramate  ER; Take 1 capsule (50 mg total) by mouth daily.  Dispense: 30  capsule; Refill: 1 Improving on Trokendi  XR 50 mg daily  Class 2 obesity due to excess calories with body mass index (BMI) of 36.0 to 36.9 in adult, unspecified whether serious comorbidity present Discussed the importance of improving sleep schedule with work changes  Bipolar 2 disorder (HCC) She reports more depressed mood  Eval and treat with psychiatry     She was informed of the importance  of frequent follow up visits to maximize her success with intensive lifestyle modifications for her multiple health conditions.   ATTESTASTION STATEMENTS:  Reviewed by clinician on day of visit: allergies, medications, problem list, medical history, surgical history, family history, social history, and previous encounter notes pertinent to obesity diagnosis.   I have personally spent 30 minutes total time today in preparation, patient care, nutritional counseling and education,  and documentation for this visit, including the following: review of most recent clinical lab tests, prescribing medications/ refilling medications, reviewing medical assistant documentation, review and interpretation of bioimpedence results.     Darice Haddock, D.O. DABFM, DABOM [facility] [address] [place], [place] [address] [phone]

## 2024-04-05 NOTE — Patient Instructions (Addendum)
 Track daily calories using the MyNetDiary App or MyFitnessPal app Aim for 1700 cal/ day This should include 100- 130 g day  Keep snacks 2 per day : Fresh fruit/ greek yogurt  Continue to hydrate well Aim for 45 min of exercise 4 days/ wk (cardio and weight training)  Create a new bedtime routine - No phones/ TV Hot shower or bath after work   GRUNS gummies OR Fiber capsule if needed but try to get fruits and veggies from your diet

## 2024-04-15 NOTE — Progress Notes (Signed)
Orders placed in this encounter.

## 2024-04-26 ENCOUNTER — Encounter (HOSPITAL_COMMUNITY): Payer: Self-pay

## 2024-04-26 ENCOUNTER — Ambulatory Visit (INDEPENDENT_AMBULATORY_CARE_PROVIDER_SITE_OTHER): Admitting: Licensed Clinical Social Worker

## 2024-04-26 DIAGNOSIS — F411 Generalized anxiety disorder: Secondary | ICD-10-CM

## 2024-04-26 DIAGNOSIS — F3181 Bipolar II disorder: Secondary | ICD-10-CM

## 2024-04-26 NOTE — Progress Notes (Signed)
 Patient has decided to go to another therapist but late cancel.

## 2024-05-10 ENCOUNTER — Ambulatory Visit: Admitting: Family Medicine

## 2024-05-12 ENCOUNTER — Telehealth: Admitting: Nurse Practitioner

## 2024-05-12 DIAGNOSIS — M25571 Pain in right ankle and joints of right foot: Secondary | ICD-10-CM

## 2024-05-12 MED ORDER — IBUPROFEN 600 MG PO TABS: 600.0000 mg | ORAL_TABLET | Freq: Three times a day (TID) | ORAL | 0 refills | Status: AC | PRN

## 2024-05-12 NOTE — Progress Notes (Signed)
 Virtual Visit Consent   Sue Jackson, you are scheduled for a virtual visit with a [facility] provider today. Just as with appointments in the office, your consent must be obtained to participate. Your consent will be active for this visit and any virtual visit you may have with one of our providers in the next 365 days. If you have a MyChart account, a copy of this consent can be sent to you electronically.  As this is a virtual visit, video technology does not allow for your provider to perform a traditional examination. This may limit your provider's ability to fully assess your condition. If your provider identifies any concerns that need to be evaluated in person or the need to arrange testing (such as labs, EKG, etc.), we will make arrangements to do so. Although advances in technology are sophisticated, we cannot ensure that it will always work on either your end or our end. If the connection with a video visit is poor, the visit may have to be switched to a telephone visit. With either a video or telephone visit, we are not always able to ensure that we have a secure connection.  By engaging in this virtual visit, you consent to the provision of healthcare and authorize for your insurance to be billed (if applicable) for the services provided during this visit. Depending on your insurance coverage, you may receive a charge related to this service.  I need to obtain your verbal consent now. Are you willing to proceed with your visit today? Sue Jackson has provided verbal consent on 05/12/2024 for a virtual visit (video or telephone). Sue LELON Servant, NP  Date: 05/12/2024 6:08 PM   Virtual Visit via Video Note   I, Sue Jackson, connected with  Sue Jackson  ([id number], 45-Jun-1982) on 05/12/24 at  6:00 PM EDT by a video-enabled telemedicine application and verified that I am speaking with the correct person using two identifiers.  Location: Patient:  Virtual Visit Location Patient: Home Provider: Virtual Visit Location Provider: Home Office   I discussed the limitations of evaluation and management by telemedicine and the availability of in person appointments. The patient expressed understanding and agreed to proceed.    History of Present Illness: Sue Jackson is a 43 y.o. who identifies as a female who was assigned female at birth, and is being seen today for right ankle pain.    Sue Jackson works for the [employer]. States work has become busier and she has been standing for long periods of time which has caused back and knee pain to worsen as well as new right ankle pain and swelling.  States she has a history of DDD.   Problems:  Patient Active Problem List   Diagnosis Date Noted   GAD (generalized anxiety disorder) 11/16/2023   Menstrual bleeding problem 10/19/2023   Hepatic steatosis 08/31/2023   Excessive thirst 08/17/2023   Iron deficiency anemia due to chronic blood loss 05/18/2023   Generalized obesity with starting BMI 35 09/22/2022   Headache, unspecified headache type 08/14/2022   Vestibular migraine 08/13/2022   RUQ abdominal pain 08/12/2022   Polyphagia 07/29/2022   Postoperative state 07/06/2022   Fibroids 05/19/2022   Uterine leiomyoma 04/20/2022   Vitamin D  deficiency 04/01/2022   Insulin  resistance 04/01/2022   Depression 04/01/2022   Malignant neoplasm of right female breast, unspecified estrogen receptor status, unspecified site of breast (HCC) 08/13/2021   Genetic testing 08/20/2020   Family history of breast cancer  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   Bipolar 2 disorder (HCC) 11/16/2019   Prediabetes 08/15/2018   Anxiety 02/06/2018   Low back pain radiating to right lower extremity 07/06/2017   Upper back pain, chronic 07/06/2017   Hypertension, essential, benign 03/11/2017    Allergies: No Known Allergies Medications:  Current Outpatient Medications:     albuterol  (VENTOLIN  HFA) 108 (90 Base) MCG/ACT inhaler, Inhale 1-2 puffs into the lungs every 6 (six) hours as needed., Disp: 8 g, Rfl: 0   amLODipine -benazepril  (LOTREL) 5-10 MG capsule, TAKE 1 CAPSULE BY MOUTH EVERY DAY, Disp: 90 capsule, Rfl: 2   cetirizine  (ZYRTEC  ALLERGY) 10 MG tablet, Take 1 tablet (10 mg total) by mouth daily., Disp: 90 tablet, Rfl: 1   ferrous gluconate  (FERGON) 324 MG tablet, Take 1 tablet (324 mg total) by mouth daily., Disp: 30 tablet, Rfl: 1   fluticasone  (FLONASE ) 50 MCG/ACT nasal spray, Place 2 sprays into both nostrils daily., Disp: 16 g, Rfl: 0   Galcanezumab -gnlm (EMGALITY ) 120 MG/ML SOAJ, INJECT 120 MG INTO THE SKIN EVERY 28 DAYS., Disp: 1 mL, Rfl: 5   hydrOXYzine  (ATARAX ) 10 MG tablet, TAKE 1 TABLET BY MOUTH THREE TIMES A DAY AS NEEDED, Disp: 270 tablet, Rfl: 2   ibuprofen  (ADVIL ) 600 MG tablet, Take 1 tablet (600 mg total) by mouth every 8 (eight) hours as needed., Disp: 30 tablet, Rfl: 0   ipratropium (ATROVENT ) 0.03 % nasal spray, Place 2 sprays into both nostrils every 12 (twelve) hours., Disp: 30 mL, Rfl: 0   loperamide  (IMODIUM  A-D) 2 MG tablet, Take 1 tablet (2 mg total) by mouth 4 (four) times daily as needed for diarrhea or loose stools., Disp: 30 tablet, Rfl: 0   meclizine  (ANTIVERT ) 25 MG tablet, Take 1 tablet (25 mg total) by mouth 3 (three) times daily as needed for dizziness., Disp: 60 tablet, Rfl: 0   polyethylene glycol powder (GLYCOLAX /MIRALAX ) 17 GM/SCOOP powder, Take 17 g by mouth 2 (two) times daily as needed., Disp: 3350 g, Rfl: 1   propranolol  (INDERAL ) 20 MG tablet, Take 20 mg by mouth 2 (two) times daily as needed., Disp: , Rfl:    SUMAtriptan  (IMITREX ) 50 MG tablet, Take 1 tablet (50 mg total) by mouth as needed for migraine. May repeat in 2 hours if headache persists or recurs.  Maximum 2 tablets in 24 hours., Disp: 10 tablet, Rfl: 5   Topiramate  ER (TROKENDI  XR) 50 MG CP24, Take 1 capsule (50 mg total) by mouth daily., Disp: 30 capsule,  Rfl: 1   tranexamic acid  (LYSTEDA ) 650 MG TABS tablet, Take 2 tablets (1,300 mg total) by mouth 3 (three) times daily., Disp: 30 tablet, Rfl: 0   traZODone (DESYREL) 50 MG tablet, Take 25-100 mg by mouth at bedtime., Disp: , Rfl:   Observations/Objective: Patient is well-developed, well-nourished in no acute distress.  Resting comfortably at home.  Head is normocephalic, atraumatic.  No labored breathing.  Speech is clear and coherent with logical content.  Patient is alert and oriented at baseline.    Assessment and Plan: 1. Acute right ankle pain (Primary) - ibuprofen  (ADVIL ) 600 MG tablet; Take 1 tablet (600 mg total) by mouth every 8 (eight) hours as needed.  Dispense: 30 tablet; Refill: 0  May use ace wrap of soft ankle brace for support RICE therapy   Follow Up Instructions: I discussed the assessment and treatment plan with the patient. The patient was provided an opportunity to ask questions and all were  answered. The patient agreed with the plan and demonstrated an understanding of the instructions.  A copy of instructions were sent to the patient via MyChart unless otherwise noted below.     The patient was advised to call back or seek an in-person evaluation if the symptoms worsen or if the condition fails to improve as anticipated.    Iver Fehrenbach W Xian Apostol, NP

## 2024-05-12 NOTE — Patient Instructions (Signed)
 Harlene Hudson Rummer, thank you for joining Haze LELON Servant, NP for today's virtual visit.  While this provider is not your primary care provider (PCP), if your PCP is located in our provider database this encounter information will be shared with them immediately following your visit.   A [facility] MyChart account gives you access to today's visit and all your visits, tests, and labs performed at [facility]  click here if you don't have a [facility] MyChart account or go to [email or web address]  Consent: (Patient) Tavon Corriher provided verbal consent for this virtual visit at the beginning of the encounter.  Current Medications:  Current Outpatient Medications:    albuterol  (VENTOLIN  HFA) 108 (90 Base) MCG/ACT inhaler, Inhale 1-2 puffs into the lungs every 6 (six) hours as needed., Disp: 8 g, Rfl: 0   amLODipine -benazepril  (LOTREL) 5-10 MG capsule, TAKE 1 CAPSULE BY MOUTH EVERY DAY, Disp: 90 capsule, Rfl: 2   cetirizine  (ZYRTEC  ALLERGY) 10 MG tablet, Take 1 tablet (10 mg total) by mouth daily., Disp: 90 tablet, Rfl: 1   ferrous gluconate  (FERGON) 324 MG tablet, Take 1 tablet (324 mg total) by mouth daily., Disp: 30 tablet, Rfl: 1   fluticasone  (FLONASE ) 50 MCG/ACT nasal spray, Place 2 sprays into both nostrils daily., Disp: 16 g, Rfl: 0   Galcanezumab -gnlm (EMGALITY ) 120 MG/ML SOAJ, INJECT 120 MG INTO THE SKIN EVERY 28 DAYS., Disp: 1 mL, Rfl: 5   hydrOXYzine  (ATARAX ) 10 MG tablet, TAKE 1 TABLET BY MOUTH THREE TIMES A DAY AS NEEDED, Disp: 270 tablet, Rfl: 2   ibuprofen  (ADVIL ) 600 MG tablet, Take 1 tablet (600 mg total) by mouth every 8 (eight) hours as needed., Disp: 30 tablet, Rfl: 0   ipratropium (ATROVENT ) 0.03 % nasal spray, Place 2 sprays into both nostrils every 12 (twelve) hours., Disp: 30 mL, Rfl: 0   loperamide  (IMODIUM  A-D) 2 MG tablet, Take 1 tablet (2 mg total) by mouth 4 (four) times daily as needed for diarrhea or loose stools., Disp: 30  tablet, Rfl: 0   meclizine  (ANTIVERT ) 25 MG tablet, Take 1 tablet (25 mg total) by mouth 3 (three) times daily as needed for dizziness., Disp: 60 tablet, Rfl: 0   polyethylene glycol powder (GLYCOLAX /MIRALAX ) 17 GM/SCOOP powder, Take 17 g by mouth 2 (two) times daily as needed., Disp: 3350 g, Rfl: 1   propranolol  (INDERAL ) 20 MG tablet, Take 20 mg by mouth 2 (two) times daily as needed., Disp: , Rfl:    SUMAtriptan  (IMITREX ) 50 MG tablet, Take 1 tablet (50 mg total) by mouth as needed for migraine. May repeat in 2 hours if headache persists or recurs.  Maximum 2 tablets in 24 hours., Disp: 10 tablet, Rfl: 5   Topiramate  ER (TROKENDI  XR) 50 MG CP24, Take 1 capsule (50 mg total) by mouth daily., Disp: 30 capsule, Rfl: 1   tranexamic acid  (LYSTEDA ) 650 MG TABS tablet, Take 2 tablets (1,300 mg total) by mouth 3 (three) times daily., Disp: 30 tablet, Rfl: 0   traZODone (DESYREL) 50 MG tablet, Take 25-100 mg by mouth at bedtime., Disp: , Rfl:    Medications ordered in this encounter:  Meds ordered this encounter  Medications   ibuprofen  (ADVIL ) 600 MG tablet    Sig: Take 1 tablet (600 mg total) by mouth every 8 (eight) hours as needed.    Dispense:  30 tablet    Refill:  0    Supervising Provider:   BLAISE ALEENE KIDD [phone]     *  If you need refills on other medications prior to your next appointment, please contact your pharmacy*  Follow-Up: Call back or seek an in-person evaluation if the symptoms worsen or if the condition fails to improve as anticipated.  [facility] [phone]  Other Instructions May use ace wrap of soft ankle brace for support RICE therapy    If you have been instructed to have an in-person evaluation today at a local Urgent Care facility, please use the link below. It will take you to a list of all of our available [facility], including address, phone number and hours of operation. Please do not delay care.  [facility]  If you or a family member do not have a primary care provider, use the link below to schedule a visit and establish care. When you choose a [facility] primary care physician or advanced practice provider, you gain a long-term partner in health. Find a Primary Care Provider  Learn more about [facility]'s in-office and virtual care options: [facility] - Get Care Now

## 2024-05-13 ENCOUNTER — Telehealth: Admitting: Family

## 2024-05-13 DIAGNOSIS — N924 Excessive bleeding in the premenopausal period: Secondary | ICD-10-CM

## 2024-05-13 NOTE — Progress Notes (Signed)
 Virtual Visit Consent   Sue Jackson, you are scheduled for a virtual visit with a [facility] provider today. Just as with appointments in the office, your consent must be obtained to participate. Your consent will be active for this visit and any virtual visit you may have with one of our providers in the next 365 days. If you have a MyChart account, a copy of this consent can be sent to you electronically.  As this is a virtual visit, video technology does not allow for your provider to perform a traditional examination. This may limit your provider's ability to fully assess your condition. If your provider identifies any concerns that need to be evaluated in person or the need to arrange testing (such as labs, EKG, etc.), we will make arrangements to do so. Although advances in technology are sophisticated, we cannot ensure that it will always work on either your end or our end. If the connection with a video visit is poor, the visit may have to be switched to a telephone visit. With either a video or telephone visit, we are not always able to ensure that we have a secure connection.  By engaging in this virtual visit, you consent to the provision of healthcare and authorize for your insurance to be billed (if applicable) for the services provided during this visit. Depending on your insurance coverage, you may receive a charge related to this service.  I need to obtain your verbal consent now. Are you willing to proceed with your visit today? Sue Jackson has provided verbal consent on 05/13/2024 for a virtual visit (video or telephone). Bari Learn, FNP  Date: 05/13/2024 12:38 PM   Virtual Visit via Video Note   I, Bari Learn, connected with  Sue Jackson  ([id number], [birth year]) on 05/13/24 at 12:30 PM EDT by a video-enabled telemedicine application and verified that I am speaking with the correct person using two identifiers.  Location: Patient:  Virtual Visit Location Patient: Home Provider: Virtual Visit Location Provider: Home Office   I discussed the limitations of evaluation and management by telemedicine and the availability of in person appointments. The patient expressed understanding and agreed to proceed.    History of Present Illness: Sue Jackson is a 43 y.o. who identifies as a female who was assigned female at birth, and is being seen today for abdominal cramping and back pain. She reports she started her menstrual cycle this morning. She has been 4 months without a period.   Reports heavy bleeding today. She reports having to use 3 pads since starting this morning. Does not feel like she can go to work today.   HPI: HPI  Problems:  Patient Active Problem List   Diagnosis Date Noted   GAD (generalized anxiety disorder) 11/16/2023   Menstrual bleeding problem 10/19/2023   Hepatic steatosis 08/31/2023   Excessive thirst 08/17/2023   Iron deficiency anemia due to chronic blood loss 05/18/2023   Generalized obesity with starting BMI 35 09/22/2022   Headache, unspecified headache type 08/14/2022   Vestibular migraine 08/13/2022   RUQ abdominal pain 08/12/2022   Polyphagia 07/29/2022   Postoperative state 07/06/2022   Fibroids 05/19/2022   Uterine leiomyoma 04/20/2022   Vitamin D  deficiency 04/01/2022   Insulin  resistance 04/01/2022   Depression 04/01/2022   Malignant neoplasm of right female breast, unspecified estrogen receptor status, unspecified site of breast (HCC) 08/13/2021   Genetic testing 08/20/2020   Family history of breast cancer    Ductal carcinoma  in situ (DCIS) of right breast 04/11/2020   Bipolar 2 disorder (HCC) 11/16/2019   Prediabetes 08/15/2018   Anxiety 02/06/2018   Low back pain radiating to right lower extremity 07/06/2017   Upper back pain, chronic 07/06/2017   Hypertension, essential, benign 03/11/2017    Allergies: No Known Allergies Medications:  Current Outpatient  Medications:    albuterol  (VENTOLIN  HFA) 108 (90 Base) MCG/ACT inhaler, Inhale 1-2 puffs into the lungs every 6 (six) hours as needed., Disp: 8 g, Rfl: 0   amLODipine -benazepril  (LOTREL) 5-10 MG capsule, TAKE 1 CAPSULE BY MOUTH EVERY DAY, Disp: 90 capsule, Rfl: 2   cetirizine  (ZYRTEC  ALLERGY) 10 MG tablet, Take 1 tablet (10 mg total) by mouth daily., Disp: 90 tablet, Rfl: 1   ferrous gluconate  (FERGON) 324 MG tablet, Take 1 tablet (324 mg total) by mouth daily., Disp: 30 tablet, Rfl: 1   fluticasone  (FLONASE ) 50 MCG/ACT nasal spray, Place 2 sprays into both nostrils daily., Disp: 16 g, Rfl: 0   Galcanezumab -gnlm (EMGALITY ) 120 MG/ML SOAJ, INJECT 120 MG INTO THE SKIN EVERY 28 DAYS., Disp: 1 mL, Rfl: 5   hydrOXYzine  (ATARAX ) 10 MG tablet, TAKE 1 TABLET BY MOUTH THREE TIMES A DAY AS NEEDED, Disp: 270 tablet, Rfl: 2   ibuprofen  (ADVIL ) 600 MG tablet, Take 1 tablet (600 mg total) by mouth every 8 (eight) hours as needed., Disp: 30 tablet, Rfl: 0   ipratropium (ATROVENT ) 0.03 % nasal spray, Place 2 sprays into both nostrils every 12 (twelve) hours., Disp: 30 mL, Rfl: 0   loperamide  (IMODIUM  A-D) 2 MG tablet, Take 1 tablet (2 mg total) by mouth 4 (four) times daily as needed for diarrhea or loose stools., Disp: 30 tablet, Rfl: 0   meclizine  (ANTIVERT ) 25 MG tablet, Take 1 tablet (25 mg total) by mouth 3 (three) times daily as needed for dizziness., Disp: 60 tablet, Rfl: 0   polyethylene glycol powder (GLYCOLAX /MIRALAX ) 17 GM/SCOOP powder, Take 17 g by mouth 2 (two) times daily as needed., Disp: 3350 g, Rfl: 1   propranolol  (INDERAL ) 20 MG tablet, Take 20 mg by mouth 2 (two) times daily as needed., Disp: , Rfl:    SUMAtriptan  (IMITREX ) 50 MG tablet, Take 1 tablet (50 mg total) by mouth as needed for migraine. May repeat in 2 hours if headache persists or recurs.  Maximum 2 tablets in 24 hours., Disp: 10 tablet, Rfl: 5   Topiramate  ER (TROKENDI  XR) 50 MG CP24, Take 1 capsule (50 mg total) by mouth daily.,  Disp: 30 capsule, Rfl: 1   tranexamic acid  (LYSTEDA ) 650 MG TABS tablet, Take 2 tablets (1,300 mg total) by mouth 3 (three) times daily., Disp: 30 tablet, Rfl: 0   traZODone (DESYREL) 50 MG tablet, Take 25-100 mg by mouth at bedtime., Disp: , Rfl:   Observations/Objective: Patient is well-developed, well-nourished in no acute distress.  Resting comfortably  at home.  Head is normocephalic, atraumatic.  No labored breathing.  Speech is clear and coherent with logical content.  Patient is alert and oriented at baseline.    Assessment and Plan: 1. Premenopausal menorrhagia (Primary)  Discussed symptoms of menopause. PT states mother went through menopause at age 74.  Discussed menopause is a full 12 months without a menstrual cycle.  Can use tylenol  and motrin  as needed  Heating pad Follow up with PCP  Follow Up Instructions: I discussed the assessment and treatment plan with the patient. The patient was provided an opportunity to ask questions and all were answered. The  patient agreed with the plan and demonstrated an understanding of the instructions.  A copy of instructions were sent to the patient via MyChart unless otherwise noted below.     The patient was advised to call back or seek an in-person evaluation if the symptoms worsen or if the condition fails to improve as anticipated.    Bari Learn, FNP

## 2024-05-13 NOTE — Patient Instructions (Signed)
 Cramps During Your Menstrual Period Cramps during your period is called dysmenorrhea. The cramps cause pain in your lower belly. In some cases, the pain may not be that bad. In other cases, the pain may be so bad that it can affect your daily life for a few days each month. There're two types of this condition: Primary. This happens when a female has their first monthly period or soon after. As a person gets older or has a baby, the cramps usually lessen or go away. Secondary. This type begins later in life. It's caused by a problem in the reproductive system. This type lasts longer. The pain may also be worse than in primary type. What are the causes? Primary dysmenorrhea is caused by hormone changes during your period.  Common causes of secondary dysmenorrhea include: Problems with the tissue that lines the uterus. This tissue may: Grow outside the uterus. Grow into the walls of the uterus. Growths in the uterus that are not cancer (fibroids). Problems with the reproductive organs. These may include problems with the uterus, fallopian tubes, or ovaries. Infection. What increases the risk? You are more likely to get this condition if: You are younger than 45 years old. You had your first period before you were 45 years old. You have irregular or heavy bleeding. You have never given birth. You have family members with the condition. You smoke or use nicotine products. You have high body weight or a low body weight. What are the signs or symptoms? Symptoms of this condition may include: Cramps and pain in the lower belly or lower back. Headaches. Throwing up or feeling like you may throw up. Watery poop (diarrhea) or loose poop. Feeling dizzy. Feeling tired. How is this diagnosed? This condition may be diagnosed based on your symptoms, medical history, and a physical exam. You may also have other tests, including: Imaging tests, such as ultrasound, X-ray, CT scan, or MRI. Blood  tests. A pregnancy test. A pap test. Taking out and testing a small piece of the tissue that lines the uterus. A procedure that uses a device with a camera to look inside the uterus, pelvis, or bladder. How is this treated? Treatment depends on the cause of your condition. You may be given: Medicines for pain. Hormones. These include shots to stop your periods. You may also get hormones through birth control pills or an IUD. Antidepressant medicines. Antibiotics. These are given if your cramps are being caused by an infection. Other treatment may include: Exercise and physical therapy. Meditation, yoga, and acupuncture. Stimulating the nerves that go to the bottom of the spine. If other treatments don't work, your health care provider may suggest having surgery. This is rare. Work with your provider to see which treatment is best for you. Follow these instructions at home: Relieving pain and cramping  Use heat as told. Put the heat on your lower back or belly when you have pain or cramps. Use the heat source that your provider recommends, such as a moist heat pack or a heating pad. Do this as often as told. Place a towel between your skin and the heat source. Leave the heat on for 20-30 minutes. If your skin turns red, take off the heat right away to prevent burns. The risk of burns is higher if you can't feel pain, heat, or cold. Do not sleep with a heating pad on. Exercise as told. Activities such as walking, swimming, or biking can help to relieve pain. Massage your lower back  or belly to help relieve pain. General instructions Take your medicines only as told. Ask your provider if it's safe to drive or use machines while taking your medicine. Avoid alcohol and caffeine during and right before your period. These can make cramps worse. Do not smoke, vape, or use nicotine or tobacco. Keep all follow-up visits. Your provider can change your treatment plan if treatment isn't  working. Contact a health care provider if: You have symptoms that get worse or do not get better with medicine. You have new symptoms. This information is not intended to replace advice given to you by your health care provider. Make sure you discuss any questions you have with your health care provider. Document Revised: 06/07/2023 Document Reviewed: 01/18/2023 Elsevier Patient Education  2024 [employer].

## 2024-05-23 ENCOUNTER — Ambulatory Visit: Payer: Self-pay

## 2024-05-23 ENCOUNTER — Telehealth: Admitting: Physician Assistant

## 2024-05-23 DIAGNOSIS — M25562 Pain in left knee: Secondary | ICD-10-CM | POA: Diagnosis not present

## 2024-05-23 NOTE — Telephone Encounter (Signed)
 Patient had scheduled virtual, changed to in person and virtual appt was cancelled.   FYI Only or Action Required?: FYI only for provider.  Patient was last seen in primary care on 05/13/2024 by Lavell Bari LABOR, FNP.  Called Nurse Triage reporting Knee Pain.  Symptoms began several weeks ago.  Interventions attempted: OTC medications: tylenol , ibuprofen .  Symptoms are: gradually worsening.  Triage Disposition: See PCP When Office is Open (Within 3 Days)  Patient/caregiver understands and will follow disposition?: Yes  Reason for Disposition  [1] MODERATE pain (e.g., interferes with normal activities, limping) AND [2] present > 3 days  Answer Assessment - Initial Assessment Questions 1. LOCATION and RADIATION: Where is the pain located?      Left knee  2. QUALITY: What does the pain feel like?  (e.g., sharp, dull, aching, burning)     Aching, can hear my bones  3. SEVERITY: How bad is the pain? What does it keep you from doing?   (Scale 1-10; or mild, moderate, severe)     7/10  4. ONSET: When did the pain start? Does it come and go, or is it there all the time?     2 weeks  5. RECURRENT: Have you had this pain before? If Yes, ask: When, and what happened then?     Denies  6. SETTING: Has there been any recent work, exercise or other activity that involved that part of the body?      Walks at lot as a Paramedic  7. AGGRAVATING FACTORS: What makes the knee pain worse? (e.g., walking, climbing stairs, running)     Walking  8. ASSOCIATED SYMPTOMS: Is there any swelling or redness of the knee?     Denies  Protocols used: Knee Pain-A-AH

## 2024-05-23 NOTE — Patient Instructions (Signed)
 Sue Jackson, thank you for joining Delon Sue Dickinson, PA-C for today's virtual visit.  While this provider is not your primary care provider (PCP), if your PCP is located in our provider database this encounter information will be shared with them immediately following your visit.   A [facility] MyChart account gives you access to today's visit and all your visits, tests, and labs performed at [facility]  click here if you don't have a [name] or go to [email or web address]  Consent: (Patient) Sue Jackson provided verbal consent for this virtual visit at the beginning of the encounter.  Current Medications:  Current Outpatient Medications:    albuterol  (VENTOLIN  HFA) 108 (90 Base) MCG/ACT inhaler, Inhale 1-2 puffs into the lungs every 6 (six) hours as needed., Disp: 8 g, Rfl: 0   amLODipine -benazepril  (LOTREL) 5-10 MG capsule, TAKE 1 CAPSULE BY MOUTH EVERY DAY, Disp: 90 capsule, Rfl: 2   cetirizine  (ZYRTEC  ALLERGY) 10 MG tablet, Take 1 tablet (10 mg total) by mouth daily., Disp: 90 tablet, Rfl: 1   ferrous gluconate  (FERGON) 324 MG tablet, Take 1 tablet (324 mg total) by mouth daily., Disp: 30 tablet, Rfl: 1   fluticasone  (FLONASE ) 50 MCG/ACT nasal spray, Place 2 sprays into both nostrils daily., Disp: 16 g, Rfl: 0   Galcanezumab -gnlm (EMGALITY ) 120 MG/ML SOAJ, INJECT 120 MG INTO THE SKIN EVERY 28 DAYS., Disp: 1 mL, Rfl: 5   hydrOXYzine  (ATARAX ) 10 MG tablet, TAKE 1 TABLET BY MOUTH THREE TIMES A DAY AS NEEDED, Disp: 270 tablet, Rfl: 2   ibuprofen  (ADVIL ) 600 MG tablet, Take 1 tablet (600 mg total) by mouth every 8 (eight) hours as needed., Disp: 30 tablet, Rfl: 0   ipratropium (ATROVENT ) 0.03 % nasal spray, Place 2 sprays into both nostrils every 12 (twelve) hours., Disp: 30 mL, Rfl: 0   loperamide  (IMODIUM  A-D) 2 MG tablet, Take 1 tablet (2 mg total) by mouth 4 (four) times daily as needed for diarrhea or loose stools., Disp: 30  tablet, Rfl: 0   meclizine  (ANTIVERT ) 25 MG tablet, Take 1 tablet (25 mg total) by mouth 3 (three) times daily as needed for dizziness., Disp: 60 tablet, Rfl: 0   polyethylene glycol powder (GLYCOLAX /MIRALAX ) 17 GM/SCOOP powder, Take 17 g by mouth 2 (two) times daily as needed., Disp: 3350 g, Rfl: 1   propranolol  (INDERAL ) 20 MG tablet, Take 20 mg by mouth 2 (two) times daily as needed., Disp: , Rfl:    SUMAtriptan  (IMITREX ) 50 MG tablet, Take 1 tablet (50 mg total) by mouth as needed for migraine. May repeat in 2 hours if headache persists or recurs.  Maximum 2 tablets in 24 hours., Disp: 10 tablet, Rfl: 5   Topiramate  ER (TROKENDI  XR) 50 MG CP24, Take 1 capsule (50 mg total) by mouth daily., Disp: 30 capsule, Rfl: 1   tranexamic acid  (LYSTEDA ) 650 MG TABS tablet, Take 2 tablets (1,300 mg total) by mouth 3 (three) times daily., Disp: 30 tablet, Rfl: 0   traZODone (DESYREL) 50 MG tablet, Take 25-100 mg by mouth at bedtime., Disp: , Rfl:    Medications ordered in this encounter:  No orders of the defined types were placed in this encounter.    *If you need refills on other medications prior to your next appointment, please contact your pharmacy*  Follow-Up: Call back or seek an in-person evaluation if the symptoms worsen or if the condition fails to improve as anticipated.  [facility] [phone]  Other Instructions Knee Exercises Ask your health care provider which exercises are safe for you. Do exercises exactly as told by your health care provider and adjust them as directed. It is normal to feel mild stretching, pulling, tightness, or discomfort as you do these exercises. Stop right away if you feel sudden pain or your pain gets worse. Do not begin these exercises until told by your health care provider. Stretching and range-of-motion exercises These exercises warm up your muscles and joints and improve the movement and flexibility of your knee. These exercises also help  to relieve pain and swelling. Knee extension, prone  Lie on your abdomen (prone position) on a bed. Place your left / right knee just beyond the edge of the surface so your knee is not on the bed. You can put a towel under your left / right thigh just above your kneecap for comfort. Relax your leg muscles and allow gravity to straighten your knee (extension). You should feel a stretch behind your left / right knee. Hold this position for __________ seconds. Scoot up so your knee is supported between repetitions. Repeat __________ times. Complete this exercise __________ times a day. Knee flexion, active  Lie on your back with both legs straight. If this causes back discomfort, bend your left / right knee so your foot is flat on the floor. Slowly slide your left / right heel back toward your buttocks. Stop when you feel a gentle stretch in the front of your knee or thigh (flexion). Hold this position for __________ seconds. Slowly slide your left / right heel back to the starting position. Repeat __________ times. Complete this exercise __________ times a day. Quadriceps stretch, prone  Lie on your abdomen on a firm surface, such as a bed or padded floor. Bend your left / right knee and hold your ankle. If you cannot reach your ankle or pant leg, loop a belt around your foot and grab the belt instead. Gently pull your heel toward your buttocks. Your knee should not slide out to the side. You should feel a stretch in the front of your thigh and knee (quadriceps). Hold this position for __________ seconds. Repeat __________ times. Complete this exercise __________ times a day. Hamstring, supine  Lie on your back (supine position). Loop a belt or towel over the ball of your left / right foot. The ball of your foot is on the walking surface, right under your toes. Straighten your left / right knee and slowly pull on the belt to raise your leg until you feel a gentle stretch behind your knee  (hamstring). Do not let your knee bend while you do this. Keep your other leg flat on the floor. Hold this position for __________ seconds. Repeat __________ times. Complete this exercise __________ times a day. Strengthening exercises These exercises build strength and endurance in your knee. Endurance is the ability to use your muscles for a long time, even after they get tired. Quadriceps, isometric This exercise strengthens the muscles in front of your thigh (quadriceps) without moving your knee joint (isometric). Lie on your back with your left / right leg extended and your other knee bent. Put a rolled towel or small pillow under your knee if told by your health care provider. Slowly tense the muscles in the front of your left / right thigh. You should see your kneecap slide up toward your hip or see increased dimpling just above the knee. This motion will push the back of the knee  toward the floor. For __________ seconds, hold the muscle as tight as you can without increasing your pain. Relax the muscles slowly and completely. Repeat __________ times. Complete this exercise __________ times a day. Straight leg raises This exercise strengthens the muscles in front of your thigh (quadriceps) and the muscles that move your hips (hip flexors). Lie on your back with your left / right leg extended and your other knee bent. Tense the muscles in the front of your left / right thigh. You should see your kneecap slide up or see increased dimpling just above the knee. Your thigh may even shake a bit. Keep these muscles tight as you raise your leg 4-6 inches (10-15 cm) off the floor. Do not let your knee bend. Hold this position for __________ seconds. Keep these muscles tense as you lower your leg. Relax your muscles slowly and completely after each repetition. Repeat __________ times. Complete this exercise __________ times a day. Hamstring, isometric  Lie on your back on a firm surface. Bend  your left / right knee about __________ degrees. Dig your left / right heel into the surface as if you are trying to pull it toward your buttocks. Tighten the muscles in the back of your thighs (hamstring) to dig as hard as you can without increasing any pain. Hold this position for __________ seconds. Release the tension gradually and allow your muscles to relax completely for __________ seconds after each repetition. Repeat __________ times. Complete this exercise __________ times a day. Hamstring curls If told by your health care provider, do this exercise while wearing ankle weights. Begin with __________lb / kg weights. Then increase the weight by 1 lb (0.5 kg) increments. Do not wear ankle weights that are more than __________lb / kg. Lie on your abdomen with your legs straight. Bend your left / right knee as far as you can without feeling pain. Keep your hips flat against the floor. Hold this position for __________ seconds. Slowly lower your leg to the starting position. Repeat __________ times. Complete this exercise __________ times a day. Squats This exercise strengthens the muscles in front of your thigh and knee (quadriceps). Stand in front of a table, with your feet and knees pointing straight ahead. You may rest your hands on the table for balance but not for support. Slowly bend your knees and lower your hips like you are going to sit in a chair. Keep your weight over your heels, not over your toes. Keep your lower legs upright so they are parallel with the table legs. Do not let your hips go lower than your knees. Do not bend lower than told by your health care provider. If your knee pain increases, do not bend as low. Hold the squat position for __________ seconds. Slowly push with your legs to return to standing. Do not use your hands to pull yourself to standing. Repeat __________ times. Complete this exercise __________ times a day. Wall slides This exercise  strengthens the muscles in front of your thigh and knee (quadriceps). Lean your back against a smooth wall or door, and walk your feet out 18-24 inches (46-61 cm) from it. Place your feet hip-width apart. Slowly slide down the wall or door until your knees bend __________ degrees. Keep your knees over your heels, not over your toes. Keep your knees in line with your hips. Hold this position for __________ seconds. Repeat __________ times. Complete this exercise __________ times a day. Straight leg raises, side-lying This exercise strengthens the  muscles that rotate the leg at the hip and move it away from your body (hip abductors). Lie on your side with your left / right leg in the top position. Lie so your head, shoulder, knee, and hip line up. You may bend your bottom knee to help you keep your balance. Roll your hips slightly forward so your hips are stacked directly over each other and your left / right knee is facing forward. Leading with your heel, lift your top leg 4-6 inches (10-15 cm). You should feel the muscles in your outer hip lifting. Do not let your foot drift forward. Do not let your knee roll toward the ceiling. Hold this position for __________ seconds. Slowly return your leg to the starting position. Let your muscles relax completely after each repetition. Repeat __________ times. Complete this exercise __________ times a day. Straight leg raises, prone This exercise stretches the muscles that move your hips away from the front of the pelvis (hip extensors). Lie on your abdomen on a firm surface. You can put a pillow under your hips if that is more comfortable. Tense the muscles in your buttocks and lift your left / right leg about 4-6 inches (10-15 cm). Keep your knee straight as you lift your leg. Hold this position for __________ seconds. Slowly lower your leg to the starting position. Let your leg relax completely after each repetition. Repeat __________ times. Complete  this exercise __________ times a day. This information is not intended to replace advice given to you by your health care provider. Make sure you discuss any questions you have with your health care provider. Document Revised: 04/07/2021 Document Reviewed: 04/07/2021 Elsevier Patient Education  2024 Elsevier Inc.   If you have been instructed to have an in-person evaluation today at a local Urgent Care facility, please use the link below. It will take you to a list of all of our available [facility], including address, phone number and hours of operation. Please do not delay care.  [facility]  If you or a family member do not have a primary care provider, use the link below to schedule a visit and establish care. When you choose a [facility] primary care physician or advanced practice provider, you gain a long-term partner in health. Find a Primary Care Provider  Learn more about [facility]'s in-office and virtual care options: [facility] - Get Care Now

## 2024-05-23 NOTE — Telephone Encounter (Signed)
 Appt 05/25/24. Sue Jackson

## 2024-05-23 NOTE — Progress Notes (Signed)
 Virtual Visit Consent   Sue Jackson, you are scheduled for a virtual visit with a [facility] provider today. Just as with appointments in the office, your consent must be obtained to participate. Your consent will be active for this visit and any virtual visit you may have with one of our providers in the next 365 days. If you have a MyChart account, a copy of this consent can be sent to you electronically.  As this is a virtual visit, video technology does not allow for your provider to perform a traditional examination. This may limit your provider's ability to fully assess your condition. If your provider identifies any concerns that need to be evaluated in person or the need to arrange testing (such as labs, EKG, etc.), we will make arrangements to do so. Although advances in technology are sophisticated, we cannot ensure that it will always work on either your end or our end. If the connection with a video visit is poor, the visit may have to be switched to a telephone visit. With either a video or telephone visit, we are not always able to ensure that we have a secure connection.  By engaging in this virtual visit, you consent to the provision of healthcare and authorize for your insurance to be billed (if applicable) for the services provided during this visit. Depending on your insurance coverage, you may receive a charge related to this service.  I need to obtain your verbal consent now. Are you willing to proceed with your visit today? Sue Jackson has provided verbal consent on 05/23/2024 for a virtual visit (video or telephone). Sue CHRISTELLA Dickinson, PA-C  Date: 05/23/2024 2:25 PM   Virtual Visit via Video Note   I, Sue Jackson, connected with  Sue Jackson  ([id number], 45/26/1982) on 05/23/24 at  2:15 PM EDT by a video-enabled telemedicine application and verified that I am speaking with the correct person using two  identifiers.  Location: Patient: Virtual Visit Location Patient: Home Provider: Virtual Visit Location Provider: Home Office   I discussed the limitations of evaluation and management by telemedicine and the availability of in person appointments. The patient expressed understanding and agreed to proceed.    History of Present Illness: Sue Jackson is a 43 y.o. who identifies as a female who was assigned female at birth, and is being seen today for knee pain.  HPI: Knee Pain  The incident occurred more than 1 week ago (2 weeks). The incident occurred at work (walking more at work; snapping for a while and now with pain). There was no injury mechanism. The pain is present in the left knee. The quality of the pain is described as aching (snapping). The pain is moderate. The pain has been Constant since onset. Associated symptoms include an inability to bear weight. Pertinent negatives include no loss of motion, loss of sensation, muscle weakness, numbness or tingling. She reports no foreign bodies present. The symptoms are aggravated by weight bearing and movement. She has tried NSAIDs (PCP sent Ibuprofen ) for the symptoms. The treatment provided no relief.    Problems:  Patient Active Problem List   Diagnosis Date Noted   GAD (generalized anxiety disorder) 11/16/2023   Menstrual bleeding problem 10/19/2023   Hepatic steatosis 08/31/2023   Excessive thirst 08/17/2023   Iron deficiency anemia due to chronic blood loss 05/18/2023   Generalized obesity with starting BMI 35 09/22/2022   Headache, unspecified headache type 08/14/2022   Vestibular migraine 08/13/2022  RUQ abdominal pain 08/12/2022   Polyphagia 07/29/2022   Postoperative state 07/06/2022   Fibroids 05/19/2022   Uterine leiomyoma 04/20/2022   Vitamin D  deficiency 04/01/2022   Insulin  resistance 04/01/2022   Depression 04/01/2022   Malignant neoplasm of right female breast, unspecified estrogen receptor status,  unspecified site of breast (HCC) 08/13/2021   Genetic testing 08/20/2020   Family history of breast cancer    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   Bipolar 2 disorder (HCC) 11/16/2019   Prediabetes 08/15/2018   Anxiety 02/06/2018   Low back pain radiating to right lower extremity 07/06/2017   Upper back pain, chronic 07/06/2017   Hypertension, essential, benign 03/11/2017    Allergies: No Known Allergies Medications:  Current Outpatient Medications:    albuterol  (VENTOLIN  HFA) 108 (90 Base) MCG/ACT inhaler, Inhale 1-2 puffs into the lungs every 6 (six) hours as needed., Disp: 8 g, Rfl: 0   amLODipine -benazepril  (LOTREL) 5-10 MG capsule, TAKE 1 CAPSULE BY MOUTH EVERY DAY, Disp: 90 capsule, Rfl: 2   cetirizine  (ZYRTEC  ALLERGY) 10 MG tablet, Take 1 tablet (10 mg total) by mouth daily., Disp: 90 tablet, Rfl: 1   ferrous gluconate  (FERGON) 324 MG tablet, Take 1 tablet (324 mg total) by mouth daily., Disp: 30 tablet, Rfl: 1   fluticasone  (FLONASE ) 50 MCG/ACT nasal spray, Place 2 sprays into both nostrils daily., Disp: 16 g, Rfl: 0   Galcanezumab -gnlm (EMGALITY ) 120 MG/ML SOAJ, INJECT 120 MG INTO THE SKIN EVERY 28 DAYS., Disp: 1 mL, Rfl: 5   hydrOXYzine  (ATARAX ) 10 MG tablet, TAKE 1 TABLET BY MOUTH THREE TIMES A DAY AS NEEDED, Disp: 270 tablet, Rfl: 2   ibuprofen  (ADVIL ) 600 MG tablet, Take 1 tablet (600 mg total) by mouth every 8 (eight) hours as needed., Disp: 30 tablet, Rfl: 0   ipratropium (ATROVENT ) 0.03 % nasal spray, Place 2 sprays into both nostrils every 12 (twelve) hours., Disp: 30 mL, Rfl: 0   loperamide  (IMODIUM  A-D) 2 MG tablet, Take 1 tablet (2 mg total) by mouth 4 (four) times daily as needed for diarrhea or loose stools., Disp: 30 tablet, Rfl: 0   meclizine  (ANTIVERT ) 25 MG tablet, Take 1 tablet (25 mg total) by mouth 3 (three) times daily as needed for dizziness., Disp: 60 tablet, Rfl: 0   polyethylene glycol powder (GLYCOLAX /MIRALAX ) 17 GM/SCOOP powder, Take  17 g by mouth 2 (two) times daily as needed., Disp: 3350 g, Rfl: 1   propranolol  (INDERAL ) 20 MG tablet, Take 20 mg by mouth 2 (two) times daily as needed., Disp: , Rfl:    SUMAtriptan  (IMITREX ) 50 MG tablet, Take 1 tablet (50 mg total) by mouth as needed for migraine. May repeat in 2 hours if headache persists or recurs.  Maximum 2 tablets in 24 hours., Disp: 10 tablet, Rfl: 5   Topiramate  ER (TROKENDI  XR) 50 MG CP24, Take 1 capsule (50 mg total) by mouth daily., Disp: 30 capsule, Rfl: 1   tranexamic acid  (LYSTEDA ) 650 MG TABS tablet, Take 2 tablets (1,300 mg total) by mouth 3 (three) times daily., Disp: 30 tablet, Rfl: 0   traZODone (DESYREL) 50 MG tablet, Take 25-100 mg by mouth at bedtime., Disp: , Rfl:   Observations/Objective: Patient is well-developed, well-nourished in no acute distress.  Resting comfortably Head is normocephalic, atraumatic.  No labored breathing.  Speech is clear and coherent with logical content.  Patient is alert and oriented at baseline.    Assessment and Plan: 1. Acute pain of left  knee (Primary)  - Has follow up with PCP on Friday, 05/25/24 - Continue Ibuprofen  - Rest - Ice - Compression sleeve - Elevate - Work note provided - Keep scheduled follow up in person  Follow Up Instructions: I discussed the assessment and treatment plan with the patient. The patient was provided an opportunity to ask questions and all were answered. The patient agreed with the plan and demonstrated an understanding of the instructions.  A copy of instructions were sent to the patient via MyChart unless otherwise noted below.    The patient was advised to call back or seek an in-person evaluation if the symptoms worsen or if the condition fails to improve as anticipated.    Sue CHRISTELLA Dickinson, PA-C

## 2024-05-25 ENCOUNTER — Ambulatory Visit: Admitting: Family Medicine

## 2024-05-31 ENCOUNTER — Ambulatory Visit: Admitting: Family Medicine

## 2024-06-10 ENCOUNTER — Telehealth: Admitting: Family

## 2024-06-10 DIAGNOSIS — N939 Abnormal uterine and vaginal bleeding, unspecified: Secondary | ICD-10-CM | POA: Diagnosis not present

## 2024-06-10 MED ORDER — TRANEXAMIC ACID 650 MG PO TABS
1300.0000 mg | ORAL_TABLET | Freq: Three times a day (TID) | ORAL | 0 refills | Status: AC
Start: 2024-06-10 — End: ?

## 2024-06-10 NOTE — Progress Notes (Signed)
 Virtual Visit Consent   Sue Jackson, you are scheduled for a virtual visit with a [facility] provider today. Just as with appointments in the office, your consent must be obtained to participate. Your consent will be active for this visit and any virtual visit you may have with one of our providers in the next 365 days. If you have a MyChart account, a copy of this consent can be sent to you electronically.  As this is a virtual visit, video technology does not allow for your provider to perform a traditional examination. This may limit your provider's ability to fully assess your condition. If your provider identifies any concerns that need to be evaluated in person or the need to arrange testing (such as labs, EKG, etc.), we will make arrangements to do so. Although advances in technology are sophisticated, we cannot ensure that it will always work on either your end or our end. If the connection with a video visit is poor, the visit may have to be switched to a telephone visit. With either a video or telephone visit, we are not always able to ensure that we have a secure connection.  By engaging in this virtual visit, you consent to the provision of healthcare and authorize for your insurance to be billed (if applicable) for the services provided during this visit. Depending on your insurance coverage, you may receive a charge related to this service.  I need to obtain your verbal consent now. Are you willing to proceed with your visit today? Sue Jackson has provided verbal consent on 06/10/2024 for a virtual visit (video or telephone). Sue Learn, FNP  Date: 06/10/2024 12:46 PM   Virtual Visit via Video Note   I, Sue Jackson, connected with  Sue Jackson  ([id number], 1980-08-10) on 06/10/24 at 12:30 PM EST by a video-enabled telemedicine application and verified that I am speaking with the correct person using two identifiers.  Location: Patient:  Virtual Visit Location Patient: Home Provider: Virtual Visit Location Provider: Home Office   I discussed the limitations of evaluation and management by telemedicine and the availability of in person appointments. The patient expressed understanding and agreed to proceed.    History of Present Illness: Sue Jackson is a 43 y.o. who identifies as a female who was assigned female at birth, and is being seen today for abnormal uterine bleeding. She reports she did not have a period four months, but started two days ago.  She has had in this in the past and has taken Lysteda  in the past that has helped. Denies any hx of DVT, CV.   HPI: HPI  Problems:  Patient Active Problem List   Diagnosis Date Noted   GAD (generalized anxiety disorder) 11/16/2023   Menstrual bleeding problem 10/19/2023   Hepatic steatosis 08/31/2023   Excessive thirst 08/17/2023   Iron deficiency anemia due to chronic blood loss 05/18/2023   Generalized obesity with starting BMI 35 09/22/2022   Headache, unspecified headache type 08/14/2022   Vestibular migraine 08/13/2022   RUQ abdominal pain 08/12/2022   Polyphagia 07/29/2022   Postoperative state 07/06/2022   Fibroids 05/19/2022   Uterine leiomyoma 04/20/2022   Vitamin D  deficiency 04/01/2022   Insulin  resistance 04/01/2022   Depression 04/01/2022   Malignant neoplasm of right female breast, unspecified estrogen receptor status, unspecified site of breast (HCC) 08/13/2021   Genetic testing 08/20/2020   Family history of breast cancer    Ductal carcinoma in situ (DCIS) of right breast  04/11/2020   Bipolar 2 disorder (HCC) 11/16/2019   Prediabetes 08/15/2018   Anxiety 02/06/2018   Low back pain radiating to right lower extremity 07/06/2017   Upper back pain, chronic 07/06/2017   Hypertension, essential, benign 03/11/2017    Allergies: No Known Allergies Medications:  Current Outpatient Medications:    albuterol  (VENTOLIN  HFA) 108 (90 Base)  MCG/ACT inhaler, Inhale 1-2 puffs into the lungs every 6 (six) hours as needed., Disp: 8 g, Rfl: 0   amLODipine -benazepril  (LOTREL) 5-10 MG capsule, TAKE 1 CAPSULE BY MOUTH EVERY DAY, Disp: 90 capsule, Rfl: 2   cetirizine  (ZYRTEC  ALLERGY) 10 MG tablet, Take 1 tablet (10 mg total) by mouth daily., Disp: 90 tablet, Rfl: 1   ferrous gluconate  (FERGON) 324 MG tablet, Take 1 tablet (324 mg total) by mouth daily., Disp: 30 tablet, Rfl: 1   fluticasone  (FLONASE ) 50 MCG/ACT nasal spray, Place 2 sprays into both nostrils daily., Disp: 16 g, Rfl: 0   Galcanezumab -gnlm (EMGALITY ) 120 MG/ML SOAJ, INJECT 120 MG INTO THE SKIN EVERY 28 DAYS., Disp: 1 mL, Rfl: 5   hydrOXYzine  (ATARAX ) 10 MG tablet, TAKE 1 TABLET BY MOUTH THREE TIMES A DAY AS NEEDED, Disp: 270 tablet, Rfl: 2   ibuprofen  (ADVIL ) 600 MG tablet, Take 1 tablet (600 mg total) by mouth every 8 (eight) hours as needed., Disp: 30 tablet, Rfl: 0   ipratropium (ATROVENT ) 0.03 % nasal spray, Place 2 sprays into both nostrils every 12 (twelve) hours., Disp: 30 mL, Rfl: 0   loperamide  (IMODIUM  A-D) 2 MG tablet, Take 1 tablet (2 mg total) by mouth 4 (four) times daily as needed for diarrhea or loose stools., Disp: 30 tablet, Rfl: 0   meclizine  (ANTIVERT ) 25 MG tablet, Take 1 tablet (25 mg total) by mouth 3 (three) times daily as needed for dizziness., Disp: 60 tablet, Rfl: 0   polyethylene glycol powder (GLYCOLAX /MIRALAX ) 17 GM/SCOOP powder, Take 17 g by mouth 2 (two) times daily as needed., Disp: 3350 g, Rfl: 1   propranolol  (INDERAL ) 20 MG tablet, Take 20 mg by mouth 2 (two) times daily as needed., Disp: , Rfl:    SUMAtriptan  (IMITREX ) 50 MG tablet, Take 1 tablet (50 mg total) by mouth as needed for migraine. May repeat in 2 hours if headache persists or recurs.  Maximum 2 tablets in 24 hours., Disp: 10 tablet, Rfl: 5   Topiramate  ER (TROKENDI  XR) 50 MG CP24, Take 1 capsule (50 mg total) by mouth daily., Disp: 30 capsule, Rfl: 1   tranexamic acid  (LYSTEDA ) 650 MG  TABS tablet, Take 2 tablets (1,300 mg total) by mouth 3 (three) times daily., Disp: 60 tablet, Rfl: 0   traZODone (DESYREL) 50 MG tablet, Take 25-100 mg by mouth at bedtime., Disp: , Rfl:   Observations/Objective: Patient is well-developed, well-nourished in no acute distress.  Resting comfortably  at home.  Head is normocephalic, atraumatic.  No labored breathing.  Speech is clear and coherent with logical content.  Patient is alert and oriented at baseline.    Assessment and Plan: 1. Abnormal uterine bleeding (AUB) - tranexamic acid  (LYSTEDA ) 650 MG TABS tablet; Take 2 tablets (1,300 mg total) by mouth 3 (three) times daily.  Dispense: 60 tablet; Refill: 0  Start Lysteda   Iron rich diet  Recommend follow up with PCP this week  Follow Up Instructions: I discussed the assessment and treatment plan with the patient. The patient was provided an opportunity to ask questions and all were answered. The patient agreed with the plan  and demonstrated an understanding of the instructions.  A copy of instructions were sent to the patient via MyChart unless otherwise noted below.     The patient was advised to call back or seek an in-person evaluation if the symptoms worsen or if the condition fails to improve as anticipated.    Sue Learn, FNP

## 2024-06-12 ENCOUNTER — Ambulatory Visit: Admitting: Neurology

## 2024-06-21 ENCOUNTER — Other Ambulatory Visit: Payer: Self-pay | Admitting: Neurology

## 2024-06-25 ENCOUNTER — Encounter: Payer: Self-pay | Admitting: Neurology

## 2024-06-28 ENCOUNTER — Other Ambulatory Visit: Payer: Self-pay | Admitting: Family Medicine

## 2024-06-28 DIAGNOSIS — R632 Polyphagia: Secondary | ICD-10-CM

## 2024-07-12 ENCOUNTER — Ambulatory Visit: Admitting: Nurse Practitioner

## 2024-07-12 ENCOUNTER — Encounter: Payer: Self-pay | Admitting: Nurse Practitioner

## 2024-07-12 VITALS — BP 134/78 | HR 76 | Temp 97.7°F | Ht 67.0 in | Wt 234.0 lb

## 2024-07-12 DIAGNOSIS — D500 Iron deficiency anemia secondary to blood loss (chronic): Secondary | ICD-10-CM | POA: Diagnosis not present

## 2024-07-12 DIAGNOSIS — Z6836 Body mass index (BMI) 36.0-36.9, adult: Secondary | ICD-10-CM

## 2024-07-12 DIAGNOSIS — E6609 Other obesity due to excess calories: Secondary | ICD-10-CM

## 2024-07-12 DIAGNOSIS — E66812 Obesity, class 2: Secondary | ICD-10-CM | POA: Diagnosis not present

## 2024-07-12 DIAGNOSIS — R7303 Prediabetes: Secondary | ICD-10-CM

## 2024-07-12 DIAGNOSIS — R632 Polyphagia: Secondary | ICD-10-CM | POA: Diagnosis not present

## 2024-07-12 MED ORDER — TOPIRAMATE ER 50 MG PO CAP24: 50.0000 mg | ORAL_CAPSULE | Freq: Every day | ORAL | 1 refills | Status: DC

## 2024-07-12 NOTE — Progress Notes (Signed)
 Office: [phone]  /  Fax: [phone]  WEIGHT SUMMARY AND BIOMETRICS  Weight Lost Since Last Visit: 1lb  Weight Gained Since Last Visit: 0lb   Vitals Temp: 97.7 F (36.5 C) BP: 134/78 Pulse Rate: 76 SpO2: 96 %   Anthropometric Measurements Height: 5' 7 (1.702 m) Weight: 234 lb (106.1 kg) BMI (Calculated): 36.64 Weight at Last Visit: 235lb Weight Lost Since Last Visit: 1lb Weight Gained Since Last Visit: 0lb Starting Weight: 229lb Total Weight Loss (lbs): 0 lb (0 kg)   Body Composition  Body Fat %: 42.8 % Fat Mass (lbs): 100.4 lbs Muscle Mass (lbs): 127.2 lbs Total Body Water  (lbs): 86.6 lbs Visceral Fat Rating : 11   Other Clinical Data Fasting: No Labs: Yes Starting Date: 03/18/22     HPI  Chief Complaint: OBESITY  Sue Jackson is here to discuss her progress with her obesity treatment plan. She is on the keeping a food journal and adhering to recommended goals of 1800 calories and 90-100 protein and states she is following her eating plan approximately 85-90 % of the time. She states she is exercising 0 minutes 0 days per week.   Interval History:  Since last office visit she has lost 1 pound.  She has been going back and forth to [place]  to help care for her mother.  She hasn't been able to schedule a follow up appt with us  until now.  She is not tracking or following a meal plan.  She is aware of what she should be doing and I'm doing that. For example, today for breakfast I ate 4 eggs, 4 beef tips, coffee with monk fruit and 1 piece of toast.  She here to get back on track.  She is drinking water , zero sugar juice, smoothie with blueberries (1/2-1cup), yogurt, twist drink. She works in the [employer] and has an active job.  She notes I have to eat good because I burn a lot of carbs.  I don't want to feel dizzy. I have to eat my protein.   She is not exercising.  She has been struggling with L knee pain and has been wearing a knee brace.  Planning  to make an appt for eval for knee pain.     Pharmacotherapy for weight loss: She is currently taking Trokendi  XR 50mg .  Denies side effects.    Previous pharmacotherapy for medical weight loss:   Qsymia -stopped due to side effects Metformin  Rybelsus  Lacks GLP 1 coverage  Bariatric surgery:  Patient has not had bariatric surgery  Iron def anemia She is not currently taking iron or a MVI.  She is unsure of the last time she took her iron.  Reports fatigue.  She notes her menses is not regular.  Her last menses prior to this one (currently having) was 3 months.    Prediabetes Last A1c was 6.1  Medication(s): none Polyphagia:Yes Lab Results  Component Value Date   HGBA1C 6.1 (H) 11/16/2023   HGBA1C 6.2 (H) 08/17/2023   HGBA1C 6.3 (H) 04/20/2023   HGBA1C 6.1 (H) 12/02/2022   HGBA1C 6.3 (H) 06/16/2022   Lab Results  Component Value Date   INSULIN  17.6 04/20/2023     PHYSICAL EXAM:  Blood pressure 134/78, pulse 76, temperature 97.7 F (36.5 C), height 5' 7 (1.702 m), weight 234 lb (106.1 kg), SpO2 96%. Body mass index is 36.65 kg/m.  General: She is overweight, cooperative, alert, well developed, and in no acute distress. PSYCH: Has normal mood, affect and thought  process.   Extremities: No edema.  Neurologic: No gross sensory or motor deficits. No tremors or fasciculations noted.    DIAGNOSTIC DATA REVIEWED:  BMET    Component Value Date/Time   NA 136 11/16/2023 1059   K 4.0 11/16/2023 1059   CL 99 11/16/2023 1059   CO2 21 11/16/2023 1059   GLUCOSE 100 (H) 11/16/2023 1059   GLUCOSE 118 (H) 07/06/2022 0914   BUN 27 (H) 11/16/2023 1059   CREATININE 0.85 11/16/2023 1059   CREATININE 1.11 (H) 04/16/2020 1211   CALCIUM 9.0 11/16/2023 1059   GFRNONAA >60 06/05/2020 0924   GFRNONAA >60 04/16/2020 1211   GFRAA >60 04/16/2020 1211   Lab Results  Component Value Date   HGBA1C 6.1 (H) 11/16/2023   HGBA1C 6.5 10/11/2017   Lab Results  Component Value Date    INSULIN  17.6 04/20/2023   Lab Results  Component Value Date   TSH 2.210 04/20/2023   CBC    Component Value Date/Time   WBC 10.0 11/16/2023 1059   WBC 6.7 07/06/2022 0900   RBC 5.09 11/16/2023 1059   RBC 4.99 07/06/2022 0900   HGB 14.3 11/16/2023 1059   HCT 44.6 11/16/2023 1059   PLT 298 11/16/2023 1059   MCV 88 11/16/2023 1059   MCH 28.1 11/16/2023 1059   MCH 26.5 07/06/2022 0900   MCHC 32.1 11/16/2023 1059   MCHC 32.0 07/06/2022 0900   RDW 14.4 11/16/2023 1059   Iron Studies    Component Value Date/Time   IRON 54 11/16/2023 1059   TIBC 357 11/16/2023 1059   FERRITIN 22 11/16/2023 1059   IRONPCTSAT 15 11/16/2023 1059   Lipid Panel     Component Value Date/Time   CHOL 148 04/20/2023 0813   TRIG 61 04/20/2023 0813   HDL 63 04/20/2023 0813   CHOLHDL 2.3 04/20/2023 0813   CHOLHDL 3 01/30/2020 0813   VLDL 12.4 01/30/2020 0813   LDLCALC 72 04/20/2023 0813   Hepatic Function Panel     Component Value Date/Time   PROT 6.3 04/20/2023 0813   ALBUMIN 3.9 04/20/2023 0813   AST 12 04/20/2023 0813   AST 14 (L) 04/16/2020 1211   ALT 18 04/20/2023 0813   ALT 27 04/16/2020 1211   ALKPHOS 69 04/20/2023 0813   BILITOT 0.2 04/20/2023 0813   BILITOT 0.3 04/16/2020 1211      Component Value Date/Time   TSH 2.210 04/20/2023 0813   TSH 1.810 03/18/2022 0951   Nutritional Lab Results  Component Value Date   VD25OH 48.2 08/17/2023   VD25OH 44.4 04/20/2023   VD25OH 25.1 (L) 12/02/2022     ASSESSMENT AND PLAN  TREATMENT PLAN FOR OBESITY:  Recommended Dietary Goals  Sue Jackson is currently in the action stage of change. As such, her goal is to continue weight management plan. She has agreed to track and will review macros at next visit.  Apps:  mynetdiary, myfitnesspal, loseit  Behavioral Intervention  We discussed the following Behavioral Modification Strategies today: increasing lean protein intake to established goals, decreasing simple carbohydrates , increasing  vegetables, increasing fiber rich foods, increasing water  intake , work on tracking and journaling calories using tracking application, continue to work on maintaining a reduced calorie state, getting the recommended amount of protein, incorporating whole foods, making healthy choices, staying well hydrated and practicing mindfulness when eating., and increase protein intake, fibrous foods (25 grams per day for women, 30 grams for men) and water  to improve satiety and decrease hunger signals. Sue Jackson  Additional resources provided today: NA  Recommended Physical Activity Goals  Sue Jackson has been advised to work up to 150 minutes of moderate intensity aerobic activity a week and strengthening exercises 2-3 times per week for cardiovascular health, weight loss maintenance and preservation of muscle mass.   She has agreed to Think about enjoyable ways to increase daily physical activity and overcoming barriers to exercise, Increase physical activity in their day and reduce sedentary time (increase NEAT)., Work on scheduling and tracking physical activity. , Increase volume of physical activity to a goal of 240 minutes a week, and Combine aerobic and strengthening exercises for efficiency and improved cardiometabolic health.   ASSOCIATED CONDITIONS ADDRESSED TODAY  Action/Plan  Iron deficiency anemia due to chronic blood loss Patient not currently taking iron or a MVI.  Will obtain labs at next visit  Prediabetes Will obtain labs at next visit  Polyphagia -     Topiramate  ER; Take 1 capsule (50 mg total) by mouth daily.  Dispense: 30 capsule; Refill: 1  Class 2 obesity due to excess calories with body mass index (BMI) of 36.0 to 36.9 in adult, unspecified whether serious comorbidity present      Will obtain fasting labs and IC at next visit. Patient is not fasting today   Return for fasting labs and IC.Sue Jackson She was informed of the importance of frequent follow up visits to maximize her success with  intensive lifestyle modifications for her multiple health conditions.   ATTESTASTION STATEMENTS:  Reviewed by clinician on day of visit: allergies, medications, problem list, medical history, surgical history, family history, social history, and previous encounter notes.     Corean SAUNDERS. Jebadiah Imperato FNP-C

## 2024-07-15 ENCOUNTER — Other Ambulatory Visit: Payer: Self-pay | Admitting: Family Medicine

## 2024-07-15 DIAGNOSIS — I10 Essential (primary) hypertension: Secondary | ICD-10-CM

## 2024-07-23 ENCOUNTER — Encounter: Payer: Self-pay | Admitting: Neurology

## 2024-07-23 ENCOUNTER — Ambulatory Visit: Admitting: Neurology

## 2024-07-23 VITALS — BP 131/82 | HR 69 | Ht 66.0 in | Wt 240.0 lb

## 2024-07-23 DIAGNOSIS — G43809 Other migraine, not intractable, without status migrainosus: Secondary | ICD-10-CM

## 2024-07-23 MED ORDER — EMGALITY 120 MG/ML ~~LOC~~ SOAJ: SUBCUTANEOUS | 5 refills | Status: AC

## 2024-07-23 MED ORDER — SUMATRIPTAN SUCCINATE 50 MG PO TABS: 50.0000 mg | ORAL_TABLET | ORAL | 5 refills | Status: AC | PRN

## 2024-07-23 NOTE — Patient Instructions (Signed)
°  Emgality  1 injection every 28 days Take sumatriptan  at earliest onset of headache.  May repeat dose once in 2 hours if needed.  Maximum 2 tablets in 24 hours.  May take with meclizine  Limit use of pain relievers to no more than 9 days out of the month.  These medications include acetaminophen , NSAIDs (ibuprofen /Advil /Motrin , naproxen /Aleve , triptans (Imitrex /sumatriptan ), Excedrin, and narcotics.  This will help reduce risk of rebound headaches. Be aware of common food triggers:  - Caffeine:  coffee, black tea, cola, Mt. Dew  - Chocolate  - Dairy:  aged cheeses (brie, blue, cheddar, gouda, Parmasan, provolone, romano, Swiss, etc), chocolate milk, buttermilk, sour cream, limit eggs and yogurt  - Nuts, peanut butter  - Alcohol  - Cereals/grains:  FRESH breads (fresh bagels, sourdough, doughnuts), yeast productions  - Processed/canned/aged/cured meats (pre-packaged deli meats, hotdogs)  - MSG/glutamate:  soy sauce, flavor enhancer, pickled/preserved/marinated foods  - Sweeteners:  aspartame (Equal, Nutrasweet).  Sugar and Splenda are okay  - Vegetables:  legumes (lima beans, lentils, snow peas, fava beans, pinto peans, peas, garbanzo beans), sauerkraut, onions, olives, pickles  - Fruit:  avocados, bananas, citrus fruit (orange, lemon, grapefruit), mango  - Other:  Frozen meals, macaroni and cheese Routine exercise Stay adequately hydrated (aim for 64 oz water  daily) Keep headache diary Maintain proper stress management Maintain proper sleep hygiene Do not skip meals Consider supplements:  magnesium citrate 400mg  daily, riboflavin 400mg  daily, coenzyme Q10 100mg  three times daily.

## 2024-07-23 NOTE — Progress Notes (Signed)
 NEUROLOGY FOLLOW UP OFFICE NOTE  Michole Dolores Kruse [id number]  Assessment/Plan:   Vestibular migraine   Migraine prevention:  Restart Emgality  Migraine rescue:  Sumatriptan  50mg  and meclizine  if needed  Limit use of meclizine  to no more than 9 days out of month. Keep headache diary Follow up 6 months.     Subjective:  Sue Jackson is a 43 year old female with HTN, DCIS, Bipolar disorder, anxiety who follows up for vestibular migraine.  UPDATE:  Overall, doing well on Emgality  Intensity:  moderate  Duration:  a day. Frequency:  4 to 5 vertigo days a month.  As she hadn't been seen in over a year, she was denied refills of Emgality  and she notes worsening symptoms.  She has been off of Emgality  for 6 weeks.  She has noticed increase in frequency and severity.  Current NSAIDS/analgesics:  none Current triptans:  sumatriptan  50mg  Current ergotamine:  none Current anti-emetic:  none Current muscle relaxants:  none Current Antihypertensive medications:  propranolol  20mg  TID PRN (anxiety), amlodipine -benazepril  Current Antidepressant medications:  none Current Anticonvulsant medications:  Trokendi  XR 50mg  daily (for weight loss; higher doses cause side effects) Current anti-CGRP:  Emgality  Current Vitamins/Herbal/Supplements: none Current Antihistamines/Decongestants:  meclizine , Zyrtec , Flonase  Other therapy:  none Hormone/birth control:  none Other medications:  none  Caffeine:  decaf coffee Alcohol:  rarely Smoker:  sometimes marijuana.  No cigarettes Diet:  No soda.  Tries to drink water  Exercise:  trying to increase Depression:  stable; Anxiety:  yes Other pain:  back pain/arthritis Sleep hygiene:  okay - sometimes anxiety may case insomnia. Works for [employer]  HISTORY:  For many years she has been experiencing dizziness.  She describes an undulating sensation associated with photophobia, phonophobia, and sometimes nausea and stuttering speech  but no visual disturbance, vomiting, numbness or weakness.  Rarely may have an occipital pressure headache.  Usually lasts a day.  Progressively have gotten worse over the past few years, now occurring daily.  Triggers include anxiety, chocolate, nuts, citrus fruits, spicy foods, dairy, skipped meals, sleep deprivation, not eat enough protein, and heat.  Resting in cool dark and quiet room helps.  She saw ENT who told her that she has vestibular migraines.  Takes meclizine  which helps.  Used to take frequently but now no more than once a week.   MRI of brain without contrast on 06/22/2020 was normal.    Past NSAIDS/analgesics:  meloxicam  Past abortive triptans:  none Past abortive ergotamine:  none Past muscle relaxants:  Flexeril  Past anti-emetic:  none Past antihypertensive medications:  propranolol  Past antidepressant medications:  sertraline , citalopram , fluoxetine  Past anticonvulsant medications:  topiramate  (side effects), gabapentin  Past anti-CGRP:  none.  WOULD NOT USE AIMOVIG DUE TO HYPERTENSION Past vitamins/Herbal/Supplements:  none Past antihistamines/decongestants:  none Other past therapies:  chiropractor    Family history of headache:  sister (migraines), other sister (migraines, vertigo)  PAST MEDICAL HISTORY: 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   Cancer (HCC) 2021   Depression    Family history of breast cancer    Headache 2021   Hypertension    Obesity    Personal history of radiation therapy    Polyphagia    Pre-diabetes    Vitamin D  deficiency     MEDICATIONS: Medications Ordered Prior to Encounter[1]  ALLERGIES: Allergies[2]  FAMILY HISTORY:  Family History  Problem Relation Age of Onset   Arthritis Mother    Asthma Mother    Cirrhosis Father        d. 47   Depression Maternal Aunt    Hypertension Maternal Aunt    Breast cancer  Maternal Aunt 37   Breast cancer Maternal Aunt 59   Breast cancer Paternal Aunt    Dementia Maternal Grandmother    Diabetes Maternal Grandfather    Heart disease Maternal Grandfather    Hypertension Maternal Grandfather    Stroke Maternal Grandfather    Breast cancer Cousin        pat first cousin      Objective:  Blood pressure 131/82, pulse 69, height 5' 6 (1.676 m), weight 240 lb (108.9 kg), SpO2 98%. General: No acute distress.  Patient appears well-groomed.   Head:  Normocephalic/atraumatic Eyes:  Fundi examined but not visualized Neck: supple, no paraspinal tenderness, full range of motion Heart:  Regular rate and rhythm Neurological Exam: alert and oriented.  Speech fluent and not dysarthric, language intact.  CN II-XII intact. Bulk and tone normal, muscle strength 5/5 throughout.  Sensation to light touch intact.  Deep tendon reflexes 2+ throughout, toes downgoing.  Finger to nose testing intact.  Gait normal, Romberg negative.   Juliene Dunnings, DO  CC: Betty Jordan, MD          [1]  Current Outpatient Medications on File Prior to Visit  Medication Sig Dispense Refill   albuterol  (VENTOLIN  HFA) 108 (90 Base) MCG/ACT inhaler Inhale 1-2 puffs into the lungs every 6 (six) hours as needed. 8 g 0   amLODipine -benazepril  (LOTREL) 5-10 MG capsule TAKE 1 CAPSULE BY MOUTH EVERY DAY 90 capsule 2   cetirizine  (ZYRTEC  ALLERGY) 10 MG tablet Take 1 tablet (10 mg total) by mouth daily. 90 tablet 1   ferrous gluconate  (FERGON) 324 MG tablet Take 1 tablet (324 mg total) by mouth daily. 30 tablet 1   fluticasone  (FLONASE ) 50 MCG/ACT nasal spray Place 2 sprays into both nostrils daily. 16 g 0   Galcanezumab -gnlm (EMGALITY ) 120 MG/ML SOAJ INJECT 120 MG INTO THE SKIN EVERY 28 DAYS. 1 mL 5   ibuprofen  (ADVIL ) 600 MG tablet Take 1 tablet (600 mg total) by mouth every 8 (eight) hours as needed. 30 tablet 0   ipratropium (ATROVENT ) 0.03 % nasal spray Place 2 sprays into both nostrils every  12 (twelve) hours. 30 mL 0   loperamide  (IMODIUM  A-D) 2 MG tablet Take 1 tablet (2 mg total) by mouth 4 (four) times daily as needed for diarrhea or loose stools. 30 tablet 0   meclizine  (ANTIVERT ) 25 MG tablet Take 1 tablet (25 mg total) by mouth 3 (three) times daily as needed for dizziness. 60 tablet 0   polyethylene glycol powder (GLYCOLAX /MIRALAX ) 17 GM/SCOOP powder Take 17 g by mouth 2 (two) times daily as needed. 3350 g 1   propranolol  (INDERAL ) 20 MG tablet Take 20 mg by mouth 2 (two) times daily as needed.     SUMAtriptan  (IMITREX ) 50 MG tablet Take 1 tablet (50 mg total) by mouth as needed for migraine. May repeat in 2 hours if headache persists or recurs.  Maximum 2 tablets in 24 hours. 10 tablet 5   Topiramate  ER (TROKENDI  XR) 50 MG CP24 Take 1 capsule (50 mg total) by mouth daily. 30 capsule 1   tranexamic acid  (LYSTEDA ) 650 MG TABS tablet Take 2 tablets (1,300 mg total) by mouth 3 (three) times daily. 60  tablet 0   traZODone (DESYREL) 50 MG tablet Take 25-100 mg by mouth at bedtime.     No current facility-administered medications on file prior to visit.  [2] No Known Allergies

## 2024-07-25 ENCOUNTER — Ambulatory Visit: Admitting: Family Medicine

## 2024-07-25 ENCOUNTER — Encounter: Payer: Self-pay | Admitting: Family Medicine

## 2024-07-25 VITALS — BP 120/76 | HR 70 | Temp 98.6°F | Resp 16 | Ht 65.5 in | Wt 241.0 lb

## 2024-07-25 DIAGNOSIS — I10 Essential (primary) hypertension: Secondary | ICD-10-CM

## 2024-07-25 DIAGNOSIS — J029 Acute pharyngitis, unspecified: Secondary | ICD-10-CM | POA: Diagnosis not present

## 2024-07-25 DIAGNOSIS — M25562 Pain in left knee: Secondary | ICD-10-CM

## 2024-07-25 DIAGNOSIS — R7303 Prediabetes: Secondary | ICD-10-CM

## 2024-07-25 LAB — POCT INFLUENZA A/B
Influenza A, POC: NEGATIVE
Influenza B, POC: NEGATIVE

## 2024-07-25 LAB — POC COVID19 BINAXNOW: SARS Coronavirus 2 Ag: NEGATIVE

## 2024-07-25 MED ORDER — AMLODIPINE BESY-BENAZEPRIL HCL 5-10 MG PO CAPS: 1.0000 | ORAL_CAPSULE | Freq: Every day | ORAL | 2 refills | Status: AC

## 2024-07-25 NOTE — Assessment & Plan Note (Signed)
 Hemoglobin A1c was 6.1 in 11/2023. She is planning on having a check again at [facility]. Encouraged consistency with a healthy lifestyle for diabetes prevention.

## 2024-07-25 NOTE — Assessment & Plan Note (Signed)
 Otherwise BP adequately controlled. She is not monitoring BP at home, recommended doing so. Continue amlodipine -benazepril  5-10 mg daily. She also takes propranolol  20 mg twice daily as needed for anxiety. Continue low-salt diet. She is planning on establishing with a new PCP closer to her home, she lives in [place]. Eye exam is current. Recommend 6 months follow-up.

## 2024-07-25 NOTE — Progress Notes (Signed)
 Chief Complaint  Patient presents with   Medical Management of Chronic Issues    Medication refill. Patient states main concern is for her medications. Patient states she feels like she is coming down with a cold asking for something to help with that.    Discussed the use of AI scribe software for clinical note transcription with the patient, who gave verbal consent to proceed. History of Present Illness Sue Jackson is a 43 year old female with a PMHx significant for HTN, vestibular migraine, prediabetes, uterine leiomyoma, anxiety, bipolar disorder, depression,and right breast cancer  who presents for medication management. Last seen on 08/31/2023, video visit.  Since her last visit she has follow-up with neurologist for vestibular migraine and with [facility] wellness [facility].  She has been experiencing left knee pain for about a month and a half, which was initially worse but has improved with the use of a brace. The pain is located inside the knee and is sometimes accompanied by a sensation of crepitus when bending knee. There is no history of recent injury. She occasionally takes Motrin  for the pain but tries to avoid it due to concerns about its effects on blood pressure. If has improved.  She feels like she is coming down with a cold, which started last night. Symptoms include a scratchy throat, and she suspects she may have caught it from her spouse, who also started feeling sick recently. No chills or body aches and has not yet tested for flu or COVID-19.  Her job is physically demanding, involving a lot of walking and lifting, but she does not engage in additional physical activity outside of work.  She has been trying to maintain a stable weight and is working on her diet by avoiding carbs, soda, and juices. She uses Ezekiel bread and limits rice intake, focusing on vegetables and protein to feel full. Despite these efforts, she finds it challenging to lower her  A1c levels, which has been elevated, no hx of diabetes. She has blood work regularly at [facility].  Lab Results  Component Value Date   HGBA1C 6.1 (H) 11/16/2023   She usually experiences vertigo, chronic, in the mornings, which makes driving difficult. She has been seeing a neurologist for her vestibular migraines. She is trying to establish with a new PCP closer to home, she lives in [place].  Anxiety and depression:She also sees a psychiatrist virtually every other month and is currently looking for a new psychotherapist after discontinuing with her previous one. She has had some stress, she has been traveling frequently to [place]  to visit her sick mother.  Hypertension: Currently she is on amlodipine -benazepril  5-10 mg daily and takes propranolol  20 mg twice daily daily for social anxiety. She has not been regularly checking her blood pressure at home, but recent reading  during OV was 131/82 mmHg. Negative for unusual or severe headache, visual changes, exertional chest pain, dyspnea,  focal weakness, or edema.  Lab Results  Component Value Date   NA 136 11/16/2023   CL 99 11/16/2023   K 4.0 11/16/2023   CO2 21 11/16/2023   BUN 27 (H) 11/16/2023   CREATININE 0.85 11/16/2023   EGFR 87 11/16/2023   CALCIUM 9.0 11/16/2023   ALBUMIN 3.9 04/20/2023   GLUCOSE 100 (H) 11/16/2023   Lab Results  Component Value Date   CHOL 148 04/20/2023   HDL 63 04/20/2023   LDLCALC 72 04/20/2023   TRIG 61 04/20/2023  CHOLHDL 2.3 04/20/2023   Review of Systems  Constitutional:  Negative for activity change, appetite change, chills and fever.  HENT:  Negative for congestion, mouth sores, rhinorrhea and trouble swallowing.   Respiratory:  Negative for cough and wheezing.   Gastrointestinal:  Negative for abdominal pain, nausea and vomiting.  Endocrine: Negative for cold intolerance and heat intolerance.  Genitourinary:  Negative for decreased urine volume, dysuria  and hematuria.  Skin:  Negative for rash.  Neurological:  Negative for syncope and facial asymmetry.  Psychiatric/Behavioral:  Negative for confusion and hallucinations.   See other pertinent positives and negatives in HPI.  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   Cancer (HCC) 2021   Depression    Family history of breast cancer    Headache 2021   Hypertension    Obesity    Personal history of radiation therapy    Polyphagia    Pre-diabetes    Vitamin D  deficiency    Allergies[2]  Social History   Socioeconomic History   Marital status: Married    Spouse name: Not on file   Number of children: 0   Years of education: Not on file   Highest education level: Not on file  Occupational History   Not on file  Tobacco Use   Smoking status: Former    Types: E-cigarettes   Smokeless tobacco: Never   Tobacco comments:    VAPES  Vaping Use   Vaping status: Every Day   Substances: CBD  Substance and Sexual Activity   Alcohol use: Yes    Comment: occasionally   Drug use: Yes    Types: Marijuana   Sexual activity: Yes    Partners: Female  Other Topics Concern   Not on file  Social History Narrative   Right handed   Social Drivers of Health   Tobacco Use: Medium Risk (07/25/2024)   Patient History    Smoking Tobacco Use: Former    Smokeless Tobacco Use: Never    Passive Exposure: Not on Actuary Strain: Not on file  Food Insecurity: Not on file  Transportation Needs: Not on file  Physical Activity: Not on file  Stress: Not on file  Social Connections: Not on file  Depression (PHQ2-9): Medium Risk (07/25/2024)   Depression (PHQ2-9)    PHQ-2 Score: 8  Alcohol Screen: Not on file  Housing: Not on file  Utilities: Not on file  Health Literacy: Not on file   Today's Vitals   07/25/24 0710   BP: 120/76  Pulse: 70  Resp: 16  Temp: 98.6 F (37 C)  TempSrc: Oral  SpO2: 97%  Weight: 241 lb (109.3 kg)  Height: 5' 5.5 (1.664 m)   Wt Readings from Last 3 Encounters:  07/25/24 241 lb (109.3 kg)  07/23/24 240 lb (108.9 kg)  07/12/24 234 lb (106.1 kg)   Body mass index is 39.49 kg/m.  Physical Exam Vitals and nursing note reviewed.  Constitutional:      General: She is not in acute distress.    Appearance: She is well-developed.  HENT:     Head: Normocephalic and atraumatic.     Nose: No congestion or rhinorrhea.     Mouth/Throat:     Mouth: Mucous membranes are moist.     Pharynx: Oropharynx is clear.  No oropharyngeal exudate or posterior oropharyngeal erythema.  Eyes:     Conjunctiva/sclera: Conjunctivae normal.  Cardiovascular:     Rate and Rhythm: Normal rate and regular rhythm.     Pulses:          Dorsalis pedis pulses are 2+ on the right side and 2+ on the left side.     Heart sounds: No murmur heard. Pulmonary:     Effort: Pulmonary effort is normal. No respiratory distress.     Breath sounds: Normal breath sounds.  Abdominal:     Palpations: Abdomen is soft. There is no mass.     Tenderness: There is no abdominal tenderness.  Musculoskeletal:     Right lower leg: No edema.     Left lower leg: No edema.  Lymphadenopathy:     Cervical: No cervical adenopathy.  Skin:    General: Skin is warm.     Findings: No erythema or rash.  Neurological:     General: No focal deficit present.     Mental Status: She is alert and oriented to person, place, and time.     Cranial Nerves: No cranial nerve deficit.     Gait: Gait normal.  Psychiatric:        Mood and Affect: Affect normal. Mood is anxious.   ASSESSMENT AND PLAN:  Sue Jackson was seen today for medical management of chronic issues.  Diagnoses and all orders for this visit:  Orders Placed This Encounter  Procedures   POC COVID-19   POC Influenza A/B   Hypertension, essential,  benign Assessment & Plan: Otherwise BP adequately controlled. She is not monitoring BP at home, recommended doing so. Continue amlodipine -benazepril  5-10 mg daily. She also takes propranolol  20 mg twice daily as needed for anxiety. Continue low-salt diet. She is planning on establishing with a new PCP closer to her home, she lives in [place]. Eye exam is current. Recommend 6 months follow-up.  Orders: -     amLODIPine  Besy-Benazepril  HCl; Take 1 capsule by mouth daily.  Dispense: 90 capsule; Refill: 2  Sore throat Scratchy throat that just started last night, her wife has been sick with similar symptoms x 1-2 days. Today we did rapid flu and COVID-19 testing, both negative. Instructed to monitor for new symptoms and recheck at home if needed.  -     POC COVID-19 BinaxNow -     POCT Influenza A/B  Prediabetes Assessment & Plan: Hemoglobin A1c was 6.1 in 11/2023. She is planning on having a check again at the healthy weight and wellness clinic. Encouraged consistency with a healthy lifestyle for diabetes prevention.  Left knee pain, unspecified chronicity No hx of trauma, so I do not think imaging is needed at this time. ? OA, patellofemoral synd among some to consider. Wt loss may help. Recommend be cautious with activities like frequent squatting, walking down stairs,or kneeling. Greatly improved, so she is not interested in further work up at this time. I think if needed we can consider PT as initial part of treatment.  Return in about 6 months (around 01/23/2025) for chronic problems with new provider..  Roni Friberg, MD [facility]. [place] office.      [1]  Current Outpatient Medications on File Prior to Visit  Medication Sig Dispense Refill   albuterol  (VENTOLIN  HFA) 108 (90 Base) MCG/ACT inhaler Inhale 1-2 puffs into the lungs every 6 (six) hours as needed. 8 g 0   cetirizine  (ZYRTEC  ALLERGY) 10 MG tablet Take 1  tablet (10 mg total) by mouth  daily. 90 tablet 1   ferrous gluconate  (FERGON) 324 MG tablet Take 1 tablet (324 mg total) by mouth daily. 30 tablet 1   fluticasone  (FLONASE ) 50 MCG/ACT nasal spray Place 2 sprays into both nostrils daily. (Patient taking differently: Place 2 sprays into both nostrils as needed.) 16 g 0   Galcanezumab -gnlm (EMGALITY ) 120 MG/ML SOAJ INJECT 120 MG INTO THE SKIN EVERY 28 DAYS. 1 mL 5   ibuprofen  (ADVIL ) 600 MG tablet Take 1 tablet (600 mg total) by mouth every 8 (eight) hours as needed. 30 tablet 0   ipratropium (ATROVENT ) 0.03 % nasal spray Place 2 sprays into both nostrils every 12 (twelve) hours. 30 mL 0   loperamide  (IMODIUM  A-D) 2 MG tablet Take 1 tablet (2 mg total) by mouth 4 (four) times daily as needed for diarrhea or loose stools. 30 tablet 0   meclizine  (ANTIVERT ) 25 MG tablet Take 1 tablet (25 mg total) by mouth 3 (three) times daily as needed for dizziness. 60 tablet 0   polyethylene glycol powder (GLYCOLAX /MIRALAX ) 17 GM/SCOOP powder Take 17 g by mouth 2 (two) times daily as needed. 3350 g 1   propranolol  (INDERAL ) 20 MG tablet Take 20 mg by mouth 2 (two) times daily as needed. (Patient taking differently: Take 20 mg by mouth 2 (two) times daily. Up to three times a day)     SUMAtriptan  (IMITREX ) 50 MG tablet Take 1 tablet (50 mg total) by mouth as needed for migraine. May repeat in 2 hours if headache persists or recurs.  Maximum 2 tablets in 24 hours. 10 tablet 5   Topiramate  ER (TROKENDI  XR) 50 MG CP24 Take 1 capsule (50 mg total) by mouth daily. 30 capsule 1   tranexamic acid  (LYSTEDA ) 650 MG TABS tablet Take 2 tablets (1,300 mg total) by mouth 3 (three) times daily. 60 tablet 0   traZODone (DESYREL) 50 MG tablet Take 25-100 mg by mouth at bedtime. (Patient taking differently: Take 25-100 mg by mouth at bedtime as needed.)     No current facility-administered medications on file prior to visit.  [2] No Known Allergies

## 2024-07-25 NOTE — Patient Instructions (Addendum)
.  A few things to remember from today's visit:  Sore throat  Hypertension, essential, benign - Plan: amLODipine -benazepril  (LOTREL) 5-10 MG capsule  Prediabetes Monitor blood pressure at home. Arrange appt with new pcp for 6 months follow up.  Monitor for new symptoms.  Check for flu again tomorrow if still having symptoms and for COVID 19 and 2 days.  If you need refills for medications you take chronically, please call your pharmacy. Do not use My Chart to request refills or for acute issues that need immediate attention. If you send a my chart message, it may take a few days to be addressed, specially if I am not in the office.  Please be sure medication list is accurate. If a new problem present, please set up appointment sooner than planned today.

## 2024-08-07 ENCOUNTER — Telehealth: Payer: Self-pay | Admitting: Family Medicine

## 2024-08-07 NOTE — Telephone Encounter (Signed)
 Copied from CRM [id number]. Topic: Appointments - Appointment Cancel/Reschedule >> Aug 07, 2024  3:24 PM Viola F wrote: Patient called to ask Dr. Jordan if her appt scheduled for 08/13/24 can be virtual? She said they did a virtual last year before. If so, please let patient know via MyChart.

## 2024-08-10 LAB — COLOGUARD: COLOGUARD: NEGATIVE

## 2024-08-13 ENCOUNTER — Encounter: Payer: Self-pay | Admitting: Family Medicine

## 2024-08-13 ENCOUNTER — Ambulatory Visit: Admitting: Family Medicine

## 2024-08-13 ENCOUNTER — Telehealth: Admitting: Family Medicine

## 2024-08-13 VITALS — Ht 65.5 in

## 2024-08-13 DIAGNOSIS — I10 Essential (primary) hypertension: Secondary | ICD-10-CM

## 2024-08-13 DIAGNOSIS — G43809 Other migraine, not intractable, without status migrainosus: Secondary | ICD-10-CM | POA: Diagnosis not present

## 2024-08-13 NOTE — Assessment & Plan Note (Addendum)
 Currently she is on Emgality  120 mg every 28 days. She also takes meclizine  25 mg 3 times daily as needed for vertigo. She follows with neurology regularly. Problem is exacerbated by walking around [place] machines at the [employer], so letter for employer given.

## 2024-08-13 NOTE — Assessment & Plan Note (Addendum)
 BP has been adequately controlled. Currently on amlodipine -benazepril  5-10 mg daily and propranolol  20 mg twice daily as well as low salt diet. Working around the [employer] usually exacerbates this problem, so letter for employer will be provided.

## 2024-08-13 NOTE — Progress Notes (Signed)
 Virtual Visit via Video Note I connected with Mell Dolores Ramone on 08/13/2024 by a video enabled telemedicine application and verified that I am speaking with the correct person using two identifiers. Location patient: home Location provider:work office Persons participating in the virtual visit: patient, provider  I discussed the limitations of evaluation and management by telemedicine and the availability of in person appointments. The patient expressed understanding and agreed to proceed.  Chief Complaint  Patient presents with   Follow-up    Needs a letter for work for restrictions.   HPI: [name] is a 44 year old female with past medical history significant for hypertension, bipolar disorder, vitamin D  deficiency, prediabetes, and vestibular migraine being seen today on video for follow-up. She is requesting a letter for her employer for restrictions. She states that being around the [employer] machine at work exacerbated her vertigo, migraines,and causes elevation of her BP, she needs a letter for her employer, so she is not in this area while working. For hypertension she is currently on amlodipine -benazepril  5-10 mg daily.  She also takes propranolol  20 mg twice daily to also help with social anxiety. In general her BP is adequately controlled. Negative for CP, dyspnea, edema, gross hematuria, focal neurologic deficit. Lab Results  Component Value Date   NA 136 11/16/2023   CL 99 11/16/2023   K 4.0 11/16/2023   CO2 21 11/16/2023   BUN 27 (H) 11/16/2023   CREATININE 0.85 11/16/2023   EGFR 87 11/16/2023   CALCIUM 9.0 11/16/2023   ALBUMIN 3.9 04/20/2023   GLUCOSE 100 (H) 11/16/2023   Vestibular migraine and vertigo: She follows with neurologist regularly and currently on Emgality  120 mg every 4 weeks as well as meclizine  25 mg 3 times daily as needed, the latter one she was instructed not to use for more than 9 days/month. Last visit with Dr. Skeet was on  07/23/2024.  ROS: See pertinent positives and negatives per HPI.  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   Cancer (HCC) 2021   Depression    Family history of breast cancer    Headache 2021   Hypertension    Obesity    Personal history of radiation therapy    Polyphagia    Pre-diabetes    Vitamin D  deficiency     Past Surgical History:  Procedure Laterality Date   BREAST BIOPSY Right 04/09/2020   BREAST BIOPSY Left 04/24/2020   x2   BREAST LUMPECTOMY Right 06/12/2020   BREAST LUMPECTOMY WITH RADIOACTIVE SEED LOCALIZATION Right 06/12/2020   Procedure: RIGHT BREAST LUMPECTOMY WITH RADIOACTIVE SEED LOCALIZATION;  Surgeon: Vernetta Berg, MD;  Location: [facility] OR;  Service: General;  Laterality: Right;  LMA   ROBOTIC ASSISTED LAPAROSCOPIC LYSIS OF ADHESION  07/06/2022   Procedure: XI ROBOTIC ASSISTED LAPAROSCOPIC LYSIS OF ADHESION;  Surgeon: Lavoie, Marie-Lyne, MD;  Location: [facility];  Service: Gynecology;;   ROBOTIC ASSISTED LAPAROSCOPIC OVARIAN CYSTECTOMY Right 07/06/2022   Procedure: XI ROBOTIC ASSISTED LAPAROSCOPIC right OVARIAN CYSTECTOMY with peritoneal washings;  Surgeon: Lavoie, Marie-Lyne, MD;  Location: [facility];  Service: Gynecology;  Laterality: Right;    Family History  Problem Relation Age of Onset   Arthritis Mother    Asthma Mother    Cirrhosis Father        d. 36   Depression Maternal Aunt  Hypertension Maternal Aunt    Breast cancer Maternal Aunt 51   Breast cancer Maternal Aunt 59   Breast cancer Paternal Aunt    Dementia Maternal Grandmother    Diabetes Maternal Grandfather    Heart disease Maternal Grandfather    Hypertension Maternal Grandfather    Stroke Maternal Grandfather    Breast cancer Cousin        pat first cousin    Social History   Socioeconomic History   Marital status: Married     Spouse name: Not on file   Number of children: 0   Years of education: Not on file   Highest education level: Not on file  Occupational History   Not on file  Tobacco Use   Smoking status: Former    Types: E-cigarettes   Smokeless tobacco: Never  Vaping Use   Vaping status: Every Day   Substances: CBD  Substance and Sexual Activity   Alcohol use: Yes    Comment: occasionally   Drug use: Yes    Types: Marijuana   Sexual activity: Yes    Partners: Female  Other Topics Concern   Not on file  Social History Narrative   Right handed   Social Drivers of Health   Tobacco Use: Medium Risk (08/13/2024)   Patient History    Smoking Tobacco Use: Former    Smokeless Tobacco Use: Never    Passive Exposure: Not on Actuary Strain: Not on file  Food Insecurity: Not on file  Transportation Needs: Not on file  Physical Activity: Not on file  Stress: Not on file  Social Connections: Not on file  Intimate Partner Violence: Not on file  Depression (PHQ2-9): Medium Risk (07/25/2024)   Depression (PHQ2-9)    PHQ-2 Score: 8  Alcohol Screen: Not on file  Housing: Not on file  Utilities: Not on file  Health Literacy: Not on file    Current Medications[1]  EXAM:  VITALS per patient if applicable:Ht 5' 5.5 (1.664 m)   LMP 07/23/2024 (Approximate)   BMI 39.49 kg/m   GENERAL: alert, oriented, appears well and in no acute distress  HEENT: atraumatic, conjunctiva clear, no obvious abnormalities on inspection of external nose and ears  NECK: normal movements of the head and neck  LUNGS: on inspection no signs of respiratory distress, breathing rate appears normal, no obvious gross SOB, gasping or wheezing  CV: no obvious cyanosis  MS: moves all visible extremities without noticeable abnormality  PSYCH/NEURO: pleasant and cooperative, no obvious depression or anxiety, speech and thought processing grossly intact  ASSESSMENT AND PLAN:  Discussed the following  assessment and plan:  Hypertension, essential, benign Assessment & Plan: BP has been adequately controlled. Currently on amlodipine -benazepril  5-10 mg daily and propranolol  20 mg twice daily as well as low salt diet. Working around the [employer] usually exacerbates this problem, so letter for employer will be provided.   Vestibular migraine Assessment & Plan: Currently she is on Emgality  120 mg every 28 days. She also takes meclizine  25 mg 3 times daily as needed for vertigo. She follows with neurology regularly. 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   We discussed possible serious and likely etiologies, options for evaluation and workup, limitations of telemedicine visit vs in person visit, treatment, treatment risks and precautions. The patient was advised to call back or seek an in-person evaluation if the symptoms worsen or if the condition fails to improve  as anticipated. I discussed the assessment and treatment plan with the patient. The patient was provided an opportunity to ask questions and all were answered. The patient agreed with the plan and demonstrated an understanding of the instructions.  Return if symptoms worsen or fail to improve.  Chiamaka Latka, MD      [1]  Current Outpatient Medications:    albuterol  (VENTOLIN  HFA) 108 (90 Base) MCG/ACT inhaler, Inhale 1-2 puffs into the lungs every 6 (six) hours as needed., Disp: 8 g, Rfl: 0   amLODipine -benazepril  (LOTREL) 5-10 MG capsule, Take 1 capsule by mouth daily., Disp: 90 capsule, Rfl: 2   cetirizine  (ZYRTEC  ALLERGY) 10 MG tablet, Take 1 tablet (10 mg total) by mouth daily., Disp: 90 tablet, Rfl: 1   ferrous gluconate  (FERGON) 324 MG tablet, Take 1 tablet (324 mg total) by mouth daily., Disp: 30 tablet, Rfl: 1   fluticasone  (FLONASE ) 50 MCG/ACT nasal spray, Place 2 sprays into both nostrils daily. (Patient taking differently: Place 2 sprays into both  nostrils as needed.), Disp: 16 g, Rfl: 0   Galcanezumab -gnlm (EMGALITY ) 120 MG/ML SOAJ, INJECT 120 MG INTO THE SKIN EVERY 28 DAYS., Disp: 1 mL, Rfl: 5   ibuprofen  (ADVIL ) 600 MG tablet, Take 1 tablet (600 mg total) by mouth every 8 (eight) hours as needed., Disp: 30 tablet, Rfl: 0   ipratropium (ATROVENT ) 0.03 % nasal spray, Place 2 sprays into both nostrils every 12 (twelve) hours., Disp: 30 mL, Rfl: 0   loperamide  (IMODIUM  A-D) 2 MG tablet, Take 1 tablet (2 mg total) by mouth 4 (four) times daily as needed for diarrhea or loose stools., Disp: 30 tablet, Rfl: 0   meclizine  (ANTIVERT ) 25 MG tablet, Take 1 tablet (25 mg total) by mouth 3 (three) times daily as needed for dizziness., Disp: 60 tablet, Rfl: 0   polyethylene glycol powder (GLYCOLAX /MIRALAX ) 17 GM/SCOOP powder, Take 17 g by mouth 2 (two) times daily as needed., Disp: 3350 g, Rfl: 1   propranolol  (INDERAL ) 20 MG tablet, Take 20 mg by mouth 2 (two) times daily as needed. (Patient taking differently: Take 20 mg by mouth 2 (two) times daily. Up to three times a day), Disp: , Rfl:    SUMAtriptan  (IMITREX ) 50 MG tablet, Take 1 tablet (50 mg total) by mouth as needed for migraine. May repeat in 2 hours if headache persists or recurs.  Maximum 2 tablets in 24 hours., Disp: 10 tablet, Rfl: 5   Topiramate  ER (TROKENDI  XR) 50 MG CP24, Take 1 capsule (50 mg total) by mouth daily., Disp: 30 capsule, Rfl: 1   tranexamic acid  (LYSTEDA ) 650 MG TABS tablet, Take 2 tablets (1,300 mg total) by mouth 3 (three) times daily., Disp: 60 tablet, Rfl: 0   traZODone (DESYREL) 50 MG tablet, Take 25-100 mg by mouth at bedtime. (Patient taking differently: Take 25-100 mg by mouth at bedtime as needed.), Disp: , Rfl:

## 2024-08-13 NOTE — Telephone Encounter (Signed)
 Seen today. BJ

## 2024-08-15 ENCOUNTER — Ambulatory Visit: Admitting: Family Medicine

## 2024-08-20 ENCOUNTER — Telehealth: Admitting: Family Medicine

## 2024-08-20 DIAGNOSIS — G43809 Other migraine, not intractable, without status migrainosus: Secondary | ICD-10-CM

## 2024-08-20 DIAGNOSIS — R42 Dizziness and giddiness: Secondary | ICD-10-CM | POA: Diagnosis not present

## 2024-08-20 NOTE — Patient Instructions (Signed)
 Vertigo Vertigo is the feeling that you or the things around you are moving or spinning when they're not. It's different than feeling dizzy. It can also cause: Loss of balance. Trouble standing or walking. Nausea and vomiting. This feeling can come and go at any time. It can last from a few seconds to minutes or even hours. It may go away on its own or be treated with medicine. What are the types of vertigo? There are two types of vertigo: Peripheral vertigo happens when parts of your inner ear don't work like they should. This is the more common type. Central vertigo happens when your brain and spinal cord don't work like they should. Your health care provider will do tests to find out what kind of vertigo you have. This will help them decide on the right treatment for you. Follow these instructions at home: Eating and drinking Drink enough fluid to keep your pee (urine) pale yellow. Do not drink alcohol. Activity When you get up in the morning, first sit up on the side of the bed. When you feel okay, stand slowly while holding onto something. Move slowly. Avoid sudden body or head movements. Avoid certain positions, as told by your provider. Use a cane if you have trouble standing or walking. Sit down right away if you feel unsteady. Place items in your home so they're easy for you to reach without bending or leaning over. Return to normal activities when you're told. Ask what things are safe for you to do. General instructions Take your medicines only as told by your provider. Contact a health care provider if: Your medicines don't help or make your vertigo worse. You get new symptoms. You have a fever. You have nausea or vomiting. Your family or friends spot any changes in how you're acting. A part of your body goes numb. You feel tingling and prickling in a part of your body. You get very bad headaches. Get help right away if: You're always dizzy or you faint. You have a  stiff neck. You have trouble moving or speaking. Your hands, arms, or legs feel weak. Your hearing or eyesight changes. These symptoms may be an emergency. Call 911 right away. Do not wait to see if the symptoms will go away. Do not drive yourself to the hospital. This information is not intended to replace advice given to you by your health care provider. Make sure you discuss any questions you have with your health care provider. Document Revised: 04/28/2023 Document Reviewed: 10/29/2022 Elsevier Patient Education  2024 [employer].

## 2024-08-20 NOTE — Progress Notes (Signed)
 " Virtual Visit Consent   Sue Jackson, you are scheduled for a virtual visit with a [facility] provider today. Just as with appointments in the office, your consent must be obtained to participate. Your consent will be active for this visit and any virtual visit you may have with one of our providers in the next 365 days. If you have a MyChart account, a copy of this consent can be sent to you electronically.  As this is a virtual visit, video technology does not allow for your provider to perform a traditional examination. This may limit your provider's ability to fully assess your condition. If your provider identifies any concerns that need to be evaluated in person or the need to arrange testing (such as labs, EKG, etc.), we will make arrangements to do so. Although advances in technology are sophisticated, we cannot ensure that it will always work on either your end or our end. If the connection with a video visit is poor, the visit may have to be switched to a telephone visit. With either a video or telephone visit, we are not always able to ensure that we have a secure connection.  By engaging in this virtual visit, you consent to the provision of healthcare and authorize for your insurance to be billed (if applicable) for the services provided during this visit. Depending on your insurance coverage, you may receive a charge related to this service.  I need to obtain your verbal consent now. Are you willing to proceed with your visit today? Sue Jackson has provided verbal consent on 08/20/2024 for a virtual visit (video or telephone). [name], FNP  Date: 08/20/2024 4:58 PM   Virtual Visit via Video Note   I, [name], connected with  Sue Jackson  ([id number], 1981-05-10) on 08/20/2024 at  5:00 PM EST by a video-enabled telemedicine application and verified that I am speaking with the correct person using two identifiers.  Location: Patient: Virtual  Visit Location Patient: Home Provider: Virtual Visit Location Provider: Home Office   I discussed the limitations of evaluation and management by telemedicine and the availability of in person appointments. The patient expressed understanding and agreed to proceed.    History of Present Illness: Sue Jackson is a 44 y.o. who identifies as a female who was assigned female at birth, and is being seen today for vertigo. Out of emgality  for a month. On second dose now. She saw her neurologist last month. She has vertigo from the emgality  but it helps so much she prefers to take it. She is taking meclizine . Needs note for work today.  HPI: HPI  Problems:  Patient Active Problem List   Diagnosis Date Noted   GAD (generalized anxiety disorder) 11/16/2023   Menstrual bleeding problem 10/19/2023   Hepatic steatosis 08/31/2023   Excessive thirst 08/17/2023   Iron deficiency anemia due to chronic blood loss 05/18/2023   Generalized obesity with starting BMI 35 09/22/2022   Headache, unspecified headache type 08/14/2022   Vestibular migraine 08/13/2022   RUQ abdominal pain 08/12/2022   Polyphagia 07/29/2022   Postoperative state 07/06/2022   Fibroids 05/19/2022   Uterine leiomyoma 04/20/2022   Vitamin D  deficiency 04/01/2022   Insulin  resistance 04/01/2022   Depression 04/01/2022   Malignant neoplasm of right female breast, unspecified estrogen receptor status, unspecified site of breast (HCC) 08/13/2021   Genetic testing 08/20/2020   Family history of breast cancer    Ductal carcinoma in situ (DCIS) of right breast  04/11/2020   Bipolar 2 disorder (HCC) 11/16/2019   Prediabetes 08/15/2018   Anxiety 02/06/2018   Low back pain radiating to right lower extremity 07/06/2017   Upper back pain, chronic 07/06/2017   Hypertension, essential, benign 03/11/2017    Allergies: Allergies[1] Medications: Current Medications[2]  Observations/Objective: Patient is well-developed,  well-nourished in no acute distress.  Resting comfortably  at home.  Head is normocephalic, atraumatic.  No labored breathing.  Speech is clear and coherent with logical content.  Patient is alert and oriented at baseline.    Assessment and Plan: 1. Vestibular migraine (Primary)  2. Vertigo  Continue to follow up with neuro. OOW today. ED if sx worsen.   Follow Up Instructions: I discussed the assessment and treatment plan with the patient. The patient was provided an opportunity to ask questions and all were answered. The patient agreed with the plan and demonstrated an understanding of the instructions.  A copy of instructions were sent to the patient via MyChart unless otherwise noted below.     The patient was advised to call back or seek an in-person evaluation if the symptoms worsen or if the condition fails to improve as anticipated.    Chadd Tollison, FNP     [1] No Known Allergies [2]  Current Outpatient Medications:    albuterol  (VENTOLIN  HFA) 108 (90 Base) MCG/ACT inhaler, Inhale 1-2 puffs into the lungs every 6 (six) hours as needed., Disp: 8 g, Rfl: 0   amLODipine -benazepril  (LOTREL) 5-10 MG capsule, Take 1 capsule by mouth daily., Disp: 90 capsule, Rfl: 2   cetirizine  (ZYRTEC  ALLERGY) 10 MG tablet, Take 1 tablet (10 mg total) by mouth daily., Disp: 90 tablet, Rfl: 1   ferrous gluconate  (FERGON) 324 MG tablet, Take 1 tablet (324 mg total) by mouth daily., Disp: 30 tablet, Rfl: 1   fluticasone  (FLONASE ) 50 MCG/ACT nasal spray, Place 2 sprays into both nostrils daily. (Patient taking differently: Place 2 sprays into both nostrils as needed.), Disp: 16 g, Rfl: 0   Galcanezumab -gnlm (EMGALITY ) 120 MG/ML SOAJ, INJECT 120 MG INTO THE SKIN EVERY 28 DAYS., Disp: 1 mL, Rfl: 5   ibuprofen  (ADVIL ) 600 MG tablet, Take 1 tablet (600 mg total) by mouth every 8 (eight) hours as needed., Disp: 30 tablet, Rfl: 0   ipratropium (ATROVENT ) 0.03 % nasal spray, Place 2 sprays into both  nostrils every 12 (twelve) hours., Disp: 30 mL, Rfl: 0   loperamide  (IMODIUM  A-D) 2 MG tablet, Take 1 tablet (2 mg total) by mouth 4 (four) times daily as needed for diarrhea or loose stools., Disp: 30 tablet, Rfl: 0   meclizine  (ANTIVERT ) 25 MG tablet, Take 1 tablet (25 mg total) by mouth 3 (three) times daily as needed for dizziness., Disp: 60 tablet, Rfl: 0   polyethylene glycol powder (GLYCOLAX /MIRALAX ) 17 GM/SCOOP powder, Take 17 g by mouth 2 (two) times daily as needed., Disp: 3350 g, Rfl: 1   propranolol  (INDERAL ) 20 MG tablet, Take 20 mg by mouth 2 (two) times daily as needed. (Patient taking differently: Take 20 mg by mouth 2 (two) times daily. Up to three times a day), Disp: , Rfl:    SUMAtriptan  (IMITREX ) 50 MG tablet, Take 1 tablet (50 mg total) by mouth as needed for migraine. May repeat in 2 hours if headache persists or recurs.  Maximum 2 tablets in 24 hours., Disp: 10 tablet, Rfl: 5   Topiramate  ER (TROKENDI  XR) 50 MG CP24, Take 1 capsule (50 mg total) by mouth daily., Disp: 30  capsule, Rfl: 1   tranexamic acid  (LYSTEDA ) 650 MG TABS tablet, Take 2 tablets (1,300 mg total) by mouth 3 (three) times daily., Disp: 60 tablet, Rfl: 0   traZODone (DESYREL) 50 MG tablet, Take 25-100 mg by mouth at bedtime. (Patient taking differently: Take 25-100 mg by mouth at bedtime as needed.), Disp: , Rfl:   "

## 2024-08-30 ENCOUNTER — Ambulatory Visit: Admitting: Family Medicine

## 2024-08-30 ENCOUNTER — Encounter: Payer: Self-pay | Admitting: Family Medicine

## 2024-08-30 VITALS — BP 132/87 | HR 66 | Ht 67.0 in | Wt 234.0 lb

## 2024-08-30 DIAGNOSIS — I10 Essential (primary) hypertension: Secondary | ICD-10-CM

## 2024-08-30 DIAGNOSIS — E559 Vitamin D deficiency, unspecified: Secondary | ICD-10-CM

## 2024-08-30 DIAGNOSIS — E66812 Obesity, class 2: Secondary | ICD-10-CM

## 2024-08-30 DIAGNOSIS — R632 Polyphagia: Secondary | ICD-10-CM

## 2024-08-30 DIAGNOSIS — M25562 Pain in left knee: Secondary | ICD-10-CM | POA: Diagnosis not present

## 2024-08-30 DIAGNOSIS — R7303 Prediabetes: Secondary | ICD-10-CM | POA: Diagnosis not present

## 2024-08-30 DIAGNOSIS — G8929 Other chronic pain: Secondary | ICD-10-CM

## 2024-08-30 DIAGNOSIS — R5383 Other fatigue: Secondary | ICD-10-CM | POA: Diagnosis not present

## 2024-08-30 DIAGNOSIS — D500 Iron deficiency anemia secondary to blood loss (chronic): Secondary | ICD-10-CM

## 2024-08-30 DIAGNOSIS — Z6836 Body mass index (BMI) 36.0-36.9, adult: Secondary | ICD-10-CM | POA: Diagnosis not present

## 2024-08-30 MED ORDER — TOPIRAMATE ER 50 MG PO CAP24: 50.0000 mg | ORAL_CAPSULE | Freq: Every day | ORAL | 2 refills | Status: AC

## 2024-08-30 NOTE — Progress Notes (Signed)
 "  Office: [phone]  /  Fax: [phone]  WEIGHT SUMMARY AND BIOMETRICS  Starting Date: 03/18/22  Starting Weight: 229lb   Weight Lost Since Last Visit: 0lb   Vitals BP: 132/87 Pulse Rate: 66 SpO2: 100 %   Body Composition  Body Fat %: 42.8 % Fat Mass (lbs): 100.4 lbs Muscle Mass (lbs): 127.4 lbs Total Body Water  (lbs): 85.6 lbs Visceral Fat Rating : 11     HPI  Chief Complaint: OBESITY  Sue Jackson is here to discuss her progress with her obesity treatment plan. She is on the keeping a food journal and adhering to recommended goals of 1800 calories and 90-100 protein and states she is following her eating plan approximately 90 % of the time. She states she is exercising 30 minutes 2 times per week.  Interval History:  Since last office visit she is down 0 lb She has had pain in her left knee x 2 weeks which is keeping her from exercise Denies any injury, swelling Wearing a L knee brace and has not been seen for this She has a new treadmill and has been walking 30 min, able to walk even with knee pain She has L knee pain (antero/ medial under patella) with going up stairs She has some chronic low back pain, lifts a lot working at the [employer] She has a net weight gain of 5 lb in 2+ years She is not been tracking her calories (time is an issue) She feels like her food choices aren't 'bad', but she tends to over consume healthy foods  Pharmacotherapy: Trokendi  XR 50 mg daily  PHYSICAL EXAM:  Blood pressure 132/87, pulse 66, height 5' 7 (1.702 m), weight 234 lb (106.1 kg), last menstrual period 07/23/2024, SpO2 100%. Body mass index is 36.65 kg/m.  General: She is overweight, cooperative, alert, well developed, and in no acute distress. PSYCH: Has normal mood, affect and thought process.   Lungs: Normal breathing effort, no conversational dyspnea.  ASSESSMENT AND PLAN  TREATMENT PLAN FOR OBESITY:  Recommended Dietary Goals  Sue Jackson is currently in  the action stage of change. As such, her goal is to continue weight management plan. She has agreed to keeping a food journal and adhering to recommended goals of 2000 calories and 100 g of  protein.  Behavioral Intervention  We discussed the following Behavioral Modification Strategies today: increasing lean protein intake to established goals, increasing fiber rich foods, increasing water  intake , work on tracking and journaling calories using tracking application, keeping healthy foods at home, decreasing eating out or consumption of processed foods, and making healthy choices when eating convenient foods, practice mindfulness eating and understand the difference between hunger signals and cravings, work on managing stress, creating time for self-care and relaxation, avoiding temptations and identifying enticing environmental cues, continue to practice mindfulness when eating, planning for success, and avoid all-or-none thinking.  Additional resources provided today: NA  Recommended Physical Activity Goals  Sue Jackson has been advised to work up to 150 minutes of moderate intensity aerobic activity a week and strengthening exercises 2-3 times per week for cardiovascular health, weight loss maintenance and preservation of muscle mass.   She has agreed to [employer] strengthening exercises with a goal of 2-3 sessions a week  and Start aerobic activity with a goal of 150 minutes a week at moderate intensity.   Pharmacotherapy changes for the treatment of obesity: none  ASSOCIATED CONDITIONS ADDRESSED TODAY   Chronic pain of left knee New, occurring x 2 weeks  better with walking Denies limping, redness or swelling or hx of trauma She would like to be seen so that she may ramp up cardio and weight training exercise -     Ambulatory referral to Sports Medicine  Prediabetes Lab Results  Component Value Date   HGBA1C 6.1 (H) 11/16/2023  Hx of metformin  intolerance Not doing well with weight  reduction Repeat lab today Ref to RD made -     Amb ref to Medical Nutrition Therapy-MNT -     Hemoglobin A1c -     Insulin , random  Class 2 obesity due to excess calories with body mass index (BMI) of 36.0 to 36.9 in adult, unspecified whether serious comorbidity present -     Amb ref to Medical Nutrition Therapy-MNT  Vitamin D  deficiency Last vitamin D  Lab Results  Component Value Date   VD25OH 48.2 08/17/2023  Stay on OTC vitamin D  2,000 international units daily  -     VITAMIN D  25 Hydroxy (Vit-D Deficiency, Fractures)  Hypertension, essential, benign BP well controlled on amlodopine/ benazepril  5/10 mg daily -     Comprehensive metabolic panel with GFR -     Lipid panel  Iron deficiency anemia due to chronic blood loss Taking ferrous gluconate  324 mg (not consistent) with hx of DUB Recommend OB f/u for management of DUB Repeat labs today -     Ferritin -     Iron and TIBC  Polyphagia -     Topiramate  ER; Take 1 capsule (50 mg total) by mouth daily.  Dispense: 30 capsule; Refill: 2 Improved on Trokendi  XR 50 mg daily Avoid ultra processed foods and high sugar foods and drinks that stimulate appetite Aim for ~100 g of lean protein and 30 g of fiber daily     She was informed of the importance of frequent follow up visits to maximize her success with intensive lifestyle modifications for her multiple health conditions.   ATTESTASTION STATEMENTS:  Reviewed by clinician on day of visit: allergies, medications, problem list, medical history, surgical history, family history, social history, and previous encounter notes pertinent to obesity diagnosis.   I have personally spent 31 minutes total time today in preparation, patient care, nutritional counseling and education,  and documentation for this visit, including the following: review of most recent clinical lab tests, prescribing medications/ refilling medications, reviewing medical assistant documentation, review and  interpretation of bioimpedence results.     Sue Jackson, D.O. DABFM, DABOM [facility] [address] [place], [place] [address] [phone] "

## 2024-08-30 NOTE — Patient Instructions (Signed)
 Keep calorie intake 2000 per day Daily protein intake should be ~90-100 g/ day Check out Cal AI -- you can take pics of foods  Referrals to nutrition and sports medicine done See the counselor See GI  Aim for 30-60 min of walking/ weight training 4-5 days/ wk  Labs today Will send results to Mychart next week

## 2024-08-31 LAB — COMPREHENSIVE METABOLIC PANEL WITH GFR
ALT: 27 IU/L (ref 0–32)
AST: 15 IU/L (ref 0–40)
Albumin: 4.6 g/dL (ref 3.9–4.9)
Alkaline Phosphatase: 73 IU/L (ref 41–116)
BUN/Creatinine Ratio: 29 — ABNORMAL HIGH (ref 9–23)
BUN: 23 mg/dL (ref 6–24)
Bilirubin Total: 0.4 mg/dL (ref 0.0–1.2)
CO2: 20 mmol/L (ref 20–29)
Calcium: 9.3 mg/dL (ref 8.7–10.2)
Chloride: 104 mmol/L (ref 96–106)
Creatinine, Ser: 0.78 mg/dL (ref 0.57–1.00)
Globulin, Total: 2.7 g/dL (ref 1.5–4.5)
Glucose: 122 mg/dL — ABNORMAL HIGH (ref 70–99)
Potassium: 4.4 mmol/L (ref 3.5–5.2)
Sodium: 139 mmol/L (ref 134–144)
Total Protein: 7.3 g/dL (ref 6.0–8.5)
eGFR: 95 mL/min/1.73

## 2024-08-31 LAB — LIPID PANEL
Chol/HDL Ratio: 3.5 ratio (ref 0.0–4.4)
Cholesterol, Total: 156 mg/dL (ref 100–199)
HDL: 45 mg/dL
LDL Chol Calc (NIH): 91 mg/dL (ref 0–99)
Triglycerides: 107 mg/dL (ref 0–149)
VLDL Cholesterol Cal: 20 mg/dL (ref 5–40)

## 2024-08-31 LAB — IRON AND TIBC
Iron Saturation: 25 % (ref 15–55)
Iron: 91 ug/dL (ref 27–159)
Total Iron Binding Capacity: 363 ug/dL (ref 250–450)
UIBC: 272 ug/dL (ref 131–425)

## 2024-08-31 LAB — HEMOGLOBIN A1C
Est. average glucose Bld gHb Est-mCnc: 140 mg/dL
Hgb A1c MFr Bld: 6.5 % — ABNORMAL HIGH (ref 4.8–5.6)

## 2024-08-31 LAB — VITAMIN D 25 HYDROXY (VIT D DEFICIENCY, FRACTURES): Vit D, 25-Hydroxy: 25.1 ng/mL — ABNORMAL LOW (ref 30.0–100.0)

## 2024-08-31 LAB — FERRITIN: Ferritin: 41 ng/mL (ref 15–150)

## 2024-08-31 LAB — INSULIN, RANDOM: INSULIN: 20.2 u[IU]/mL (ref 2.6–24.9)

## 2024-09-03 ENCOUNTER — Ambulatory Visit: Payer: Self-pay | Admitting: Family Medicine

## 2024-09-04 ENCOUNTER — Encounter: Payer: Self-pay | Admitting: Family Medicine

## 2024-09-04 DIAGNOSIS — G8929 Other chronic pain: Secondary | ICD-10-CM

## 2024-09-06 ENCOUNTER — Encounter: Payer: Self-pay | Admitting: Dietician

## 2024-09-06 ENCOUNTER — Encounter: Admitting: Dietician

## 2024-09-06 DIAGNOSIS — E66812 Obesity, class 2: Secondary | ICD-10-CM | POA: Insufficient documentation

## 2024-09-06 NOTE — Progress Notes (Signed)
 Medical Nutrition Therapy  Appointment Start time:  0830  Appointment End time:  0930  Primary concerns today: trying to eat healthier and get on a better routine and bring A1c down.    Referral diagnosis: prediabetes Preferred learning style: no preference indicated Learning readiness: ready   NUTRITION ASSESSMENT   Anthropometrics   Wt 09/06/24: declined  Clinical Medical Hx: reviewed; anemia, anxiety, arthritis, prediabetes, depression, HTN Medications: reviewed Labs: reviewed; 08/30/24: vitamin d  25, A1c 6.5% Notable Signs/Symptoms: none reported Food Allergies: none reported  Lifestyle & Dietary Hx  Pt reports she is wanting to work on eating healthier and finding a better routine. Pt reports she recently had her labs done and wants to avoid going on medications for blood glucose management.   Pt reports she has tried to do some food tracking but finds it difficult and not sustainable with her schedule.   Pt reports she works for the [employer], 2pm-10:30pm most days. Pt reports her job is high stress, and she has also had outside stressors recently with her mom and having to drive to/from [place] . Pt reports stress has been a 9 out of 10.   Pt reports she typically goes to bed around 3am, wakes at 8am, eats breakfast and goes back to sleep. Pt reports she cooks most of her meals and does not eat out often.   Pt reports she just got a treadmill and recently started doing some walking/running. Pt reports prior to this she was not exercising consistently.   Pt states she feels she has been more hungry. Pt reports she notices more hunger when she avoids carbohydrates.   Estimated daily fluid intake: 64 oz Supplements: vitamin D3 + K2 Sleep: 3am-8am.  Stress / self-care: pt reports 9 out of 10 Current average weekly physical activity: ADLs, just started doing the treadmill every other day.   24-Hr Dietary Recall First Meal: 9am: 4 scrambled eggs and 3 oz beef burger or 2  sausage, black beans, 1 ezekiel toast Snack: none Second Meal: none OR burger with keto bun and black beans Snack: smoothie with chobani greek yogurt with zero sugar juice and blueberries Snack: peanut bar and fruit Third Meal: 9:30pm: green beans, sweet potato, meat Snack: peanuts Beverages: water , 2-3 zero sugar soda, decaf coffee   NUTRITION INTERVENTION  Nutrition education (E-1) on the following topics:   Plate Method Fruits & Vegetables: Aim to fill half your plate with a variety of fruits and vegetables. They are rich in vitamins, minerals, and fiber, and can help reduce the risk of chronic diseases. Choose a colorful assortment of fruits and vegetables to ensure you get a wide range of nutrients. Grains and Starches: Make at least half of your grain choices whole grains, such as brown rice, whole wheat bread, and oats. Whole grains provide fiber, which aids in digestion and healthy cholesterol levels. Aim for whole forms of starchy vegetables such as potatoes, sweet potatoes, beans, peas, and corn, which are fiber rich and provide many vitamins and minerals.  Protein: Incorporate lean sources of protein, such as poultry, fish, beans, nuts, and seeds, into your meals. Protein is essential for building and repairing tissues, staying full, balancing blood sugar, as well as supporting immune function. Dairy: Include low-fat or fat-free dairy products like milk, yogurt, and cheese in your diet. Dairy foods are excellent sources of calcium and vitamin D , which are crucial for bone health.   Prediabetes Prediabetes: Prediabetes is a condition where blood sugar levels are higher than  normal but not yet high enough to be diagnosed as type 2 diabetes. A1C, or hemoglobin A1c, is a blood test that provides an average of a person's blood sugar levels over the past two to three months. It is commonly used to diagnose and monitor diabetes. For prediabetes, an A1C level between 5.7% and 6.4% typically  is used to diagnose this. Here is how the A1C levels are generally categorized: Normal:  A1C below 5.7% Prediabetes:  A1C between 5.7% and 6.4% Diabetes:  A1C of 6.5% or higher When diagnosed with prediabetes, there are several lifestyle changes you can make to manage the condition: Healthy Eating:  Follow a well-balanced diet that includes a variety of fruits, vegetables, whole grains, lean proteins, and healthy fats. Monitor portion sizes and reduce intake of sugary and processed foods. Regular Physical Activity:  Engage in regular physical activity, such as brisk walking, cycling, or other aerobic exercises, for at least 150 minutes per week. Include strength training exercises at least twice a week. Weight Management: Achieve and maintain a healthy weight. Losing even a small amount of weight (3-5%) can significantly improve insulin  sensitivity.  Why you need complex carbohydrates: Whole grains and other complex carbohydrates are required to have a healthy diet. Whole grains provide fiber which can help with blood glucose levels and help keep you satiated. Fruits and starchy vegetables provide essential vitamins and minerals required for immune function, eyesight support, brain support, bone density, wound healing and many other functions within the body. According to the current evidenced based 2020-2025 Dietary Guidelines for Americans, complex carbohydrates are part of a healthy eating pattern which is associated with a decreased risk for type 2 diabetes, cancers, and cardiovascular disease.   Handouts Provided Include  Plate Method  Learning Style & Readiness for Change Teaching method utilized: Visual & Auditory  Demonstrated degree of understanding via: Teach Back  Barriers to learning/adherence to lifestyle change: none  Goals Established by Pt  Goal 1: continue the walk/run 3x/wk, add in strength training the days in between.   Goal 2: at meals, aim to include 1/2 plate of  non-starchy vegetables. Keep complex carbs at 1/4 of the plate.   Goal 3: try to fall asleep by 1am.   MONITORING & EVALUATION Dietary intake, weekly physical activity, and follow up in 6 weeks.  Next Steps  Patient is to call for questions.

## 2024-09-06 NOTE — Patient Instructions (Signed)
 Goals Established by Pt  Goal 1: continue the walk/run 3x/wk, add in strength training the days in between.   Goal 2: at meals, aim to include 1/2 plate of non-starchy vegetables. Keep complex carbs at 1/4 of the plate.   Goal 3: try to fall asleep by 1am.

## 2024-10-04 ENCOUNTER — Ambulatory Visit: Admitting: Orthopaedic Surgery

## 2024-10-18 ENCOUNTER — Encounter: Admitting: Dietician

## 2024-11-01 ENCOUNTER — Ambulatory Visit: Admitting: Family Medicine

## 2024-12-20 ENCOUNTER — Ambulatory Visit: Admitting: Neurology

## 2025-01-23 ENCOUNTER — Telehealth: Payer: Self-pay | Admitting: Neurology
